# Patient Record
Sex: Female | Born: 1945
Health system: Southern US, Community
[De-identification: ages and names within clinical notes are randomized; demographics above are authoritative.]

## PROBLEM LIST (undated history)

## (undated) ENCOUNTER — Ambulatory Visit (HOSPITAL_COMMUNITY)

## (undated) DIAGNOSIS — Z90710 Acquired absence of both cervix and uterus: Secondary | ICD-10-CM

## (undated) DIAGNOSIS — I10 Essential (primary) hypertension: Secondary | ICD-10-CM

## (undated) DIAGNOSIS — E785 Hyperlipidemia, unspecified: Secondary | ICD-10-CM

## (undated) DIAGNOSIS — K219 Gastro-esophageal reflux disease without esophagitis: Secondary | ICD-10-CM

## (undated) DIAGNOSIS — E1142 Type 2 diabetes mellitus with diabetic polyneuropathy: Secondary | ICD-10-CM

## (undated) DIAGNOSIS — E1149 Type 2 diabetes mellitus with other diabetic neurological complication: Secondary | ICD-10-CM

## (undated) DIAGNOSIS — K5900 Constipation, unspecified: Secondary | ICD-10-CM

## (undated) DIAGNOSIS — Z889 Allergy status to unspecified drugs, medicaments and biological substances status: Secondary | ICD-10-CM

## (undated) DIAGNOSIS — IMO0002 Reserved for concepts with insufficient information to code with codable children: Secondary | ICD-10-CM

## (undated) DIAGNOSIS — M159 Polyosteoarthritis, unspecified: Secondary | ICD-10-CM

## (undated) DIAGNOSIS — K589 Irritable bowel syndrome without diarrhea: Secondary | ICD-10-CM

## (undated) DIAGNOSIS — M1990 Unspecified osteoarthritis, unspecified site: Secondary | ICD-10-CM

## (undated) HISTORY — DX: Unspecified osteoarthritis, unspecified site: M19.90

## (undated) HISTORY — DX: Type 2 diabetes mellitus with diabetic polyneuropathy: E11.42

## (undated) HISTORY — DX: Allergy status to unspecified drugs, medicaments and biological substances: Z88.9

## (undated) HISTORY — DX: Hyperlipidemia, unspecified: E78.5

## (undated) HISTORY — DX: Acquired absence of both cervix and uterus: Z90.710

## (undated) HISTORY — PX: PELVIC LAPAROSCOPY: SHX162

## (undated) HISTORY — DX: Essential (primary) hypertension: I10

## (undated) HISTORY — DX: Constipation, unspecified: K59.00

## (undated) HISTORY — DX: Irritable bowel syndrome, unspecified: K58.9

## (undated) HISTORY — DX: Type 2 diabetes mellitus with other diabetic neurological complication: E11.49

## (undated) HISTORY — PX: CATARACT EXTRACTION: SUR2

## (undated) HISTORY — DX: Gastro-esophageal reflux disease without esophagitis: K21.9

## (undated) HISTORY — DX: Polyosteoarthritis, unspecified: M15.9

## (undated) HISTORY — DX: Reserved for concepts with insufficient information to code with codable children: IMO0002

---

## 1972-04-16 HISTORY — PX: TONSILECTOMY, ADENOIDECTOMY, BILATERAL MYRINGOTOMY AND TUBES: SHX2538

## 1981-04-16 HISTORY — PX: VAGINAL HYSTERECTOMY: SUR661

## 1984-04-16 HISTORY — PX: OVARIAN CYST SURGERY: SHX726

## 2005-10-22 ENCOUNTER — Ambulatory Visit: Payer: Self-pay | Admitting: Family Medicine

## 2005-10-24 ENCOUNTER — Ambulatory Visit: Payer: Self-pay | Admitting: *Deleted

## 2005-11-05 ENCOUNTER — Ambulatory Visit: Payer: Self-pay | Admitting: Family Medicine

## 2005-12-10 ENCOUNTER — Ambulatory Visit: Payer: Self-pay | Admitting: Family Medicine

## 2006-05-08 ENCOUNTER — Ambulatory Visit: Payer: Self-pay | Admitting: Family Medicine

## 2006-05-09 ENCOUNTER — Ambulatory Visit (HOSPITAL_COMMUNITY): Admission: RE | Admit: 2006-05-09 | Discharge: 2006-05-09 | Payer: Self-pay | Admitting: Internal Medicine

## 2006-08-28 ENCOUNTER — Ambulatory Visit: Payer: Self-pay | Admitting: Family Medicine

## 2006-11-04 ENCOUNTER — Ambulatory Visit: Payer: Self-pay | Admitting: Internal Medicine

## 2006-12-10 ENCOUNTER — Ambulatory Visit: Payer: Self-pay | Admitting: Family Medicine

## 2007-01-01 ENCOUNTER — Encounter (INDEPENDENT_AMBULATORY_CARE_PROVIDER_SITE_OTHER): Payer: Self-pay | Admitting: *Deleted

## 2007-02-05 ENCOUNTER — Ambulatory Visit: Payer: Self-pay | Admitting: Family Medicine

## 2007-02-05 LAB — CONVERTED CEMR LAB
ALT: 19 units/L (ref 0–35)
AST: 18 units/L (ref 0–37)
Albumin: 4.4 g/dL (ref 3.5–5.2)
Alkaline Phosphatase: 63 units/L (ref 39–117)
BUN: 22 mg/dL (ref 6–23)
CO2: 24 meq/L (ref 19–32)
Calcium: 9.4 mg/dL (ref 8.4–10.5)
Chloride: 103 meq/L (ref 96–112)
Cholesterol: 259 mg/dL — ABNORMAL HIGH (ref 0–200)
Creatinine, Ser: 1.07 mg/dL (ref 0.40–1.20)
Glucose, Bld: 115 mg/dL — ABNORMAL HIGH (ref 70–99)
HDL: 58 mg/dL (ref >39)
LDL Cholesterol: 143 mg/dL — ABNORMAL HIGH (ref 0–99)
Potassium: 4.2 meq/L (ref 3.5–5.3)
Sodium: 142 meq/L (ref 135–145)
Total Bilirubin: 0.5 mg/dL (ref 0.3–1.2)
Total CHOL/HDL Ratio: 4.5
Total Protein: 7.8 g/dL (ref 6.0–8.3)
Triglycerides: 290 mg/dL — ABNORMAL HIGH (ref <150)
VLDL: 58 mg/dL — ABNORMAL HIGH (ref 0–40)

## 2007-02-20 ENCOUNTER — Ambulatory Visit: Payer: Self-pay | Admitting: Family Medicine

## 2007-02-20 LAB — CONVERTED CEMR LAB: Helicobacter Pylori Antibody-IgG: 0.9

## 2007-03-31 ENCOUNTER — Ambulatory Visit: Payer: Self-pay | Admitting: Internal Medicine

## 2007-06-03 ENCOUNTER — Encounter (INDEPENDENT_AMBULATORY_CARE_PROVIDER_SITE_OTHER): Payer: Self-pay | Admitting: Family Medicine

## 2007-06-03 ENCOUNTER — Ambulatory Visit: Payer: Self-pay | Admitting: Family Medicine

## 2007-06-03 LAB — CONVERTED CEMR LAB
Chlamydia, DNA Probe: NEGATIVE
Cholesterol: 171 mg/dL (ref 0–200)
Free T4: 0.87 ng/dL — ABNORMAL LOW (ref 0.89–1.80)
GC Probe Amp, Genital: NEGATIVE
HDL: 56 mg/dL (ref >39)
LDL Cholesterol: 66 mg/dL (ref 0–99)
TSH: 1.192 microintl units/mL (ref 0.350–5.50)
Total CHOL/HDL Ratio: 3.1
Triglycerides: 244 mg/dL — ABNORMAL HIGH (ref <150)
VLDL: 49 mg/dL — ABNORMAL HIGH (ref 0–40)

## 2007-06-05 ENCOUNTER — Ambulatory Visit (HOSPITAL_COMMUNITY): Admission: RE | Admit: 2007-06-05 | Discharge: 2007-06-05 | Payer: Self-pay | Admitting: Family Medicine

## 2007-06-06 ENCOUNTER — Ambulatory Visit (HOSPITAL_COMMUNITY): Admission: RE | Admit: 2007-06-06 | Discharge: 2007-06-06 | Payer: Self-pay | Admitting: Family Medicine

## 2007-06-20 ENCOUNTER — Ambulatory Visit: Payer: Self-pay | Admitting: Family Medicine

## 2007-08-22 ENCOUNTER — Ambulatory Visit: Payer: Self-pay | Admitting: Internal Medicine

## 2007-09-03 ENCOUNTER — Ambulatory Visit (HOSPITAL_COMMUNITY): Admission: RE | Admit: 2007-09-03 | Discharge: 2007-09-03 | Payer: Self-pay | Admitting: Family Medicine

## 2007-09-03 ENCOUNTER — Ambulatory Visit: Payer: Self-pay | Admitting: Vascular Surgery

## 2007-09-03 ENCOUNTER — Encounter (INDEPENDENT_AMBULATORY_CARE_PROVIDER_SITE_OTHER): Payer: Self-pay | Admitting: Family Medicine

## 2007-09-22 ENCOUNTER — Ambulatory Visit: Payer: Self-pay | Admitting: Internal Medicine

## 2007-09-22 ENCOUNTER — Encounter (INDEPENDENT_AMBULATORY_CARE_PROVIDER_SITE_OTHER): Payer: Self-pay | Admitting: Family Medicine

## 2007-09-22 LAB — CONVERTED CEMR LAB
ALT: 19 units/L (ref 0–35)
BUN: 20 mg/dL (ref 6–23)
CO2: 25 meq/L (ref 19–32)
Calcium: 9.1 mg/dL (ref 8.4–10.5)
Chloride: 107 meq/L (ref 96–112)
Cholesterol: 175 mg/dL (ref 0–200)
Creatinine, Ser: 0.76 mg/dL (ref 0.40–1.20)
Free T4: 0.93 ng/dL (ref 0.89–1.80)
Glucose, Bld: 145 mg/dL — ABNORMAL HIGH (ref 70–99)
HDL: 58 mg/dL (ref >39)
LDL Cholesterol: 78 mg/dL (ref 0–99)
Potassium: 4.2 meq/L (ref 3.5–5.3)
Sodium: 144 meq/L (ref 135–145)
T4, Total: 5.7 ug/dL (ref 5.0–12.5)
TSH: 2.013 microintl units/mL (ref 0.350–5.50)
Total CHOL/HDL Ratio: 3
Triglycerides: 196 mg/dL — ABNORMAL HIGH (ref <150)
VLDL: 39 mg/dL (ref 0–40)

## 2008-05-04 ENCOUNTER — Ambulatory Visit: Payer: Self-pay | Admitting: Family Medicine

## 2008-05-28 ENCOUNTER — Ambulatory Visit: Payer: Self-pay | Admitting: Family Medicine

## 2008-06-16 ENCOUNTER — Ambulatory Visit: Payer: Self-pay | Admitting: Family Medicine

## 2008-06-16 LAB — CONVERTED CEMR LAB
ALT: 18 units/L (ref 0–35)
AST: 22 units/L (ref 0–37)
Albumin: 4.2 g/dL (ref 3.5–5.2)
Alkaline Phosphatase: 51 units/L (ref 39–117)
BUN: 18 mg/dL (ref 6–23)
Band Neutrophils: 0 % (ref 0–10)
Basophils Absolute: 0 10*3/uL (ref 0.0–0.1)
Basophils Relative: 1 % (ref 0–1)
CO2: 23 meq/L (ref 19–32)
Calcium: 9.1 mg/dL (ref 8.4–10.5)
Chloride: 105 meq/L (ref 96–112)
Creatinine, Ser: 0.89 mg/dL (ref 0.40–1.20)
Eosinophils Absolute: 0.3 10*3/uL (ref 0.0–0.7)
Eosinophils Relative: 5 % (ref 0–5)
Glucose, Bld: 112 mg/dL — ABNORMAL HIGH (ref 70–99)
HCT: 43.6 % (ref 36.0–46.0)
Hemoglobin: 14.1 g/dL (ref 12.0–15.0)
Lymphocytes Relative: 52 % — ABNORMAL HIGH (ref 12–46)
Lymphs Abs: 2.4 10*3/uL (ref 0.7–4.0)
MCHC: 32.3 g/dL (ref 30.0–36.0)
MCV: 88.1 fL (ref 78.0–100.0)
Monocytes Absolute: 0.5 10*3/uL (ref 0.1–1.0)
Monocytes Relative: 11 % (ref 3–12)
Neutro Abs: 1.4 10*3/uL — ABNORMAL LOW (ref 1.7–7.7)
Neutrophils Relative %: 31 % — ABNORMAL LOW (ref 43–77)
Platelets: 186 10*3/uL (ref 150–400)
Potassium: 4.5 meq/L (ref 3.5–5.3)
RBC: 4.95 M/uL (ref 3.87–5.11)
RDW: 13.5 % (ref 11.5–15.5)
Sed Rate: 14 mm/hr (ref 0–22)
Sodium: 144 meq/L (ref 135–145)
Total Bilirubin: 0.5 mg/dL (ref 0.3–1.2)
Total Protein: 7.4 g/dL (ref 6.0–8.3)
WBC: 4.6 10*3/uL (ref 4.0–10.5)

## 2008-08-17 ENCOUNTER — Ambulatory Visit: Payer: Self-pay | Admitting: Family Medicine

## 2008-08-19 ENCOUNTER — Ambulatory Visit: Payer: Self-pay | Admitting: Family Medicine

## 2008-09-01 ENCOUNTER — Ambulatory Visit: Payer: Self-pay | Admitting: Internal Medicine

## 2008-09-09 ENCOUNTER — Ambulatory Visit (HOSPITAL_COMMUNITY): Admission: RE | Admit: 2008-09-09 | Discharge: 2008-09-09 | Payer: Self-pay | Admitting: Internal Medicine

## 2008-09-21 ENCOUNTER — Ambulatory Visit (HOSPITAL_BASED_OUTPATIENT_CLINIC_OR_DEPARTMENT_OTHER): Admission: RE | Admit: 2008-09-21 | Discharge: 2008-09-21 | Payer: Self-pay | Admitting: Family Medicine

## 2008-09-26 ENCOUNTER — Ambulatory Visit: Payer: Self-pay | Admitting: Internal Medicine

## 2008-10-20 ENCOUNTER — Ambulatory Visit: Payer: Self-pay | Admitting: Internal Medicine

## 2008-11-02 ENCOUNTER — Ambulatory Visit: Payer: Self-pay | Admitting: Family Medicine

## 2009-01-27 ENCOUNTER — Ambulatory Visit: Payer: Self-pay | Admitting: Family Medicine

## 2009-01-27 LAB — CONVERTED CEMR LAB: Microalb, Ur: 0.5 mg/dL (ref 0.00–1.89)

## 2009-02-10 ENCOUNTER — Ambulatory Visit (HOSPITAL_COMMUNITY): Admission: RE | Admit: 2009-02-10 | Discharge: 2009-02-10 | Payer: Self-pay | Admitting: Family Medicine

## 2009-04-28 ENCOUNTER — Encounter: Admission: RE | Admit: 2009-04-28 | Discharge: 2009-04-28 | Payer: Self-pay | Admitting: Nephrology

## 2009-06-06 ENCOUNTER — Ambulatory Visit (HOSPITAL_COMMUNITY): Admission: RE | Admit: 2009-06-06 | Discharge: 2009-06-06 | Payer: Self-pay | Admitting: Obstetrics

## 2009-07-14 ENCOUNTER — Ambulatory Visit: Payer: Self-pay | Admitting: Psychology

## 2009-07-22 ENCOUNTER — Encounter: Admission: RE | Admit: 2009-07-22 | Discharge: 2009-07-22 | Payer: Self-pay | Admitting: Neurology

## 2009-07-22 ENCOUNTER — Emergency Department (HOSPITAL_COMMUNITY): Admission: EM | Admit: 2009-07-22 | Discharge: 2009-07-22 | Payer: Self-pay | Admitting: Emergency Medicine

## 2009-07-26 ENCOUNTER — Ambulatory Visit: Payer: Self-pay | Admitting: Family Medicine

## 2009-09-21 ENCOUNTER — Ambulatory Visit: Payer: Self-pay | Admitting: Family Medicine

## 2009-09-30 ENCOUNTER — Ambulatory Visit (HOSPITAL_COMMUNITY): Admission: RE | Admit: 2009-09-30 | Discharge: 2009-09-30 | Payer: Self-pay | Admitting: Family Medicine

## 2009-09-30 LAB — HM DEXA SCAN

## 2009-10-12 ENCOUNTER — Ambulatory Visit: Payer: Self-pay | Admitting: Family Medicine

## 2009-10-13 ENCOUNTER — Ambulatory Visit (HOSPITAL_COMMUNITY): Admission: RE | Admit: 2009-10-13 | Discharge: 2009-10-13 | Payer: Self-pay | Admitting: Family Medicine

## 2010-01-27 ENCOUNTER — Ambulatory Visit (HOSPITAL_COMMUNITY): Admission: RE | Admit: 2010-01-27 | Discharge: 2010-01-27 | Payer: Self-pay | Admitting: Gastroenterology

## 2010-01-31 ENCOUNTER — Ambulatory Visit: Payer: Self-pay | Admitting: Psychology

## 2010-02-14 ENCOUNTER — Encounter (INDEPENDENT_AMBULATORY_CARE_PROVIDER_SITE_OTHER): Payer: Self-pay | Admitting: Family Medicine

## 2010-02-14 LAB — CONVERTED CEMR LAB
Cholesterol: 155 mg/dL (ref 0–200)
HDL: 52 mg/dL (ref >39)
Hgb A1c MFr Bld: 7 % — ABNORMAL HIGH (ref <5.7)
LDL Cholesterol: 61 mg/dL (ref 0–99)
Total CHOL/HDL Ratio: 3
Triglycerides: 212 mg/dL — ABNORMAL HIGH (ref <150)
Uric Acid, Serum: 5.6 mg/dL (ref 2.4–7.0)
VLDL: 42 mg/dL — ABNORMAL HIGH (ref 0–40)

## 2010-05-07 ENCOUNTER — Encounter: Payer: Self-pay | Admitting: Obstetrics

## 2010-05-16 NOTE — Miscellaneous (Signed)
Summary: VIP  Patient: Sierra Ray Note: All result statuses are Final unless otherwise noted.  Tests: (1) VIP (Medications)   LLIMPORTMEDS              "Result Below..."       RESULT: ZOLOFT TABS 100 MG*TAKE ONE (1) TABLET EACH DAY*12/10/2006*Last Refill: [id number]*******   LLIMPORTMEDS              "Result Below..."       RESULT: TRAZODONE HCL TABS 100 MG*TAKE ONE (1) TABLET AT BEDTIME*08/28/2006*Last Refill: Unknown*[id number]*******   LLIMPORTMEDS              "Result Below..."       RESULT: TOPROL XL TB24 25 MG*TAKE ONE-HALF TABLET BY MOUTH DAILY   GENE OVER-INCOME 01/08*12/10/2006*Last Refill: Unknown*[id number]*******   LLIMPORTMEDS              "Result Below..."       RESULT: NORVASC TABS 2.5 MG*TAKE ONE (1) TABLET BY MOUTH EVERY DAY*12/10/2006*Last Refill: Unknown*[id number]*******   LLIMPORTMEDS              "Result Below..."       RESULT: NEXIUM CPDR 40 MG*TAKE ONE CAPSULE BY MOUTH DAILY*11/04/2006*Last  Refill: 12/09/2006*[id number]*******   LLIMPORTMEDS              "Result Below..."       RESULT: NASACORT AQ AERS 55 MCG/ACT*USE TWO SPRAYS IN EACH NOSTRIL EVERY NIGHT AT BEDTIME*12/10/2006*Last Refill: [id number]*******   LLIMPORTMEDS              "Result Below..."       RESULT: METFORMIN HCL TB24 500 MG*TAKE ONE (1) TABLET TWICE DAILY*12/10/2006*Last Refill: OZHYQMV*[id number]*******   LLIMPORTMEDS              "Result Below..."       RESULT: CRESTOR TABS 10 MG*TAKE ONE-HALF TABLET DAILY   GENE OVER-INCOME 01/08*12/10/2006*Last Refill: [id number]*******   LLIMPORTMEDS              "Result Below..."       RESULT: CEFUROXIME AXETIL TABS 250 MG*TAKE ONE TABLET TWICE A DAY FOR 10 DAYS*12/10/2006*Last Refill: Unknown*[id number]*******   LLIMPORTALLS              NKDA***  Note: An exclamation mark (!) indicates a result that was not dispersed into the flowsheet. Document Creation Date: 02/13/2007 3:06 PM _______________________________________________________________________  (1) Order  result status: Final Collection or observation date-time: 01/01/2007 Requested date-time: 01/01/2007 Receipt date-time:  Reported date-time: 01/01/2007 Referring Physician:   Ordering Physician:   Specimen Source:  Source: [place] Order Number:  Lab site:

## 2010-07-05 LAB — WET PREP, GENITAL
Clue Cells Wet Prep HPF POC: NONE SEEN
Trich, Wet Prep: NONE SEEN
Yeast Wet Prep HPF POC: NONE SEEN

## 2010-07-05 LAB — CBC
HCT: 42.4 % (ref 36.0–46.0)
Hemoglobin: 14.1 g/dL (ref 12.0–15.0)
MCHC: 33.2 g/dL (ref 30.0–36.0)
MCV: 87.5 fL (ref 78.0–100.0)
Platelets: 186 10*3/uL (ref 150–400)
RBC: 4.84 MIL/uL (ref 3.87–5.11)
RDW: 13.9 % (ref 11.5–15.5)
WBC: 6.4 10*3/uL (ref 4.0–10.5)

## 2010-07-05 LAB — DIFFERENTIAL
Basophils Absolute: 0 10*3/uL (ref 0.0–0.1)
Basophils Relative: 1 % (ref 0–1)
Eosinophils Absolute: 0.2 10*3/uL (ref 0.0–0.7)
Eosinophils Relative: 3 % (ref 0–5)
Lymphocytes Relative: 50 % — ABNORMAL HIGH (ref 12–46)
Lymphs Abs: 3.2 10*3/uL (ref 0.7–4.0)
Monocytes Absolute: 0.5 10*3/uL (ref 0.1–1.0)
Monocytes Relative: 9 % (ref 3–12)
Neutro Abs: 2.4 10*3/uL (ref 1.7–7.7)
Neutrophils Relative %: 38 % — ABNORMAL LOW (ref 43–77)

## 2010-07-05 LAB — GC/CHLAMYDIA PROBE AMP, GENITAL
Chlamydia, DNA Probe: NEGATIVE
GC Probe Amp, Genital: NEGATIVE

## 2010-07-05 LAB — URINALYSIS, ROUTINE W REFLEX MICROSCOPIC
Bilirubin Urine: NEGATIVE
Glucose, UA: NEGATIVE mg/dL
Hgb urine dipstick: NEGATIVE
Ketones, ur: NEGATIVE mg/dL
Nitrite: NEGATIVE
Protein, ur: NEGATIVE mg/dL
Specific Gravity, Urine: 1.002 — ABNORMAL LOW (ref 1.005–1.030)
Urobilinogen, UA: 0.2 mg/dL (ref 0.0–1.0)
pH: 7.5 (ref 5.0–8.0)

## 2010-07-05 LAB — POCT I-STAT, CHEM 8
BUN: 15 mg/dL (ref 6–23)
Calcium, Ion: 1.18 mmol/L (ref 1.12–1.32)
Chloride: 105 mEq/L (ref 96–112)
Creatinine, Ser: 0.7 mg/dL (ref 0.4–1.2)
Glucose, Bld: 125 mg/dL — ABNORMAL HIGH (ref 70–99)
HCT: 46 % (ref 36.0–46.0)
Hemoglobin: 15.6 g/dL — ABNORMAL HIGH (ref 12.0–15.0)
Potassium: 3.6 mEq/L (ref 3.5–5.1)
Sodium: 142 mEq/L (ref 135–145)
TCO2: 29 mmol/L (ref 0–100)

## 2010-08-22 ENCOUNTER — Other Ambulatory Visit (HOSPITAL_COMMUNITY)
Admission: RE | Admit: 2010-08-22 | Discharge: 2010-08-22 | Disposition: A | Discharge status: Home / Self Care | Payer: Self-pay | Source: Ambulatory Visit | Attending: Obstetrics and Gynecology | Admitting: Obstetrics and Gynecology

## 2010-08-22 ENCOUNTER — Other Ambulatory Visit: Payer: Self-pay

## 2010-08-22 DIAGNOSIS — Z01419 Encounter for gynecological examination (general) (routine) without abnormal findings: Secondary | ICD-10-CM | POA: Insufficient documentation

## 2010-08-29 NOTE — Procedures (Signed)
Sierra Ray, BUMP         ACCOUNT NO.:  [id number]   MEDICAL RECORD NO.:  [id number]          PATIENT TYPE:  OUT   LOCATION:  SLEEP CENTER                 FACILITY:  [facility]   PHYSICIAN:  Clinton D. Maple Hudson, MD, FCCP, FACPDATE OF BIRTH:  [birth year] 04, 1946   DATE OF STUDY:  09/21/2008                            NOCTURNAL POLYSOMNOGRAM   REFERRING PHYSICIAN:   INDICATIONS FOR PROCEDURE:  Insomnia with sleep apnea and daytime  somnolence.   EPWORTH SLEEPINESS SCORE:  Epworth sleepiness score  9/24.  BMI 26.6.  Weight 170 pounds.  Height 67 inches.  Neck 13.5 inches.   HOME MEDICATIONS:  Charted and reviewed.   SLEEP ARCHITECTURE:  Total sleep time 264 minutes with sleep efficiency  70.5%.  Stage I was 6.8%, stage II was 82%, stage III absent, REM 11.2%  of total sleep time.  Sleep latency 48 minutes, REM latency 198 minutes,  awake after sleep onset 54 minutes, arousal index 10.  No bedtime  medication was taken.  Sleep onset was at about 11:20 p.m. and she was  awake for about an hour between 12:30 and 1:30 a.m.   RESPIRATORY DATA:  Apnea/hypopnea index (AHI) 0.  There were no  significant respiratory events, disturbing sleep.   OXYGEN DATA:  Mild-to-moderate snoring with oxygen desaturation to a  nadir of 93%.  Mean oxygen saturation through the study was 96.5% on  room air.   CARDIAC DATA:  Normal sinus rhythm.   MOVEMENT/PARASOMNIA:  No significant movement disturbance.  Bathroom x2.   IMPRESSION/RECOMMENDATION:  1. No significant physiologic events identified causing sleep      disturbance, AHI 0 per hour and no significant movement      disturbance.  Mild-to-moderate snoring with oxygen desaturation to      a nadir of 93%.  2. She indicated home sleep difficulty allowing only 2 hours of sleep      at night because of panic disorder, depression, and anxiety.      Clinton D. Maple Hudson, MD, [facility], FACP  Diplomate, Biomedical engineer of Sleep Medicine  Electronically  Signed     CDY/MEDQ  D:  09/26/2008 15:01:51  T:  09/27/2008 00:05:42  Job:  [id number]

## 2011-05-08 ENCOUNTER — Other Ambulatory Visit: Payer: Self-pay | Admitting: Family Medicine

## 2011-05-08 DIAGNOSIS — IMO0002 Reserved for concepts with insufficient information to code with codable children: Secondary | ICD-10-CM

## 2011-05-08 DIAGNOSIS — R87619 Unspecified abnormal cytological findings in specimens from cervix uteri: Secondary | ICD-10-CM

## 2011-05-08 HISTORY — DX: Unspecified abnormal cytological findings in specimens from cervix uteri: R87.619

## 2011-05-08 HISTORY — DX: Reserved for concepts with insufficient information to code with codable children: IMO0002

## 2011-06-21 ENCOUNTER — Ambulatory Visit (INDEPENDENT_AMBULATORY_CARE_PROVIDER_SITE_OTHER): Payer: Self-pay | Admitting: Physician Assistant

## 2011-06-21 ENCOUNTER — Encounter: Payer: Self-pay | Admitting: Physician Assistant

## 2011-06-21 ENCOUNTER — Other Ambulatory Visit (HOSPITAL_COMMUNITY)
Admission: RE | Admit: 2011-06-21 | Discharge: 2011-06-21 | Disposition: A | Discharge status: Home / Self Care | Payer: Self-pay | Source: Ambulatory Visit | Attending: Obstetrics & Gynecology | Admitting: Obstetrics & Gynecology

## 2011-06-21 VITALS — BP 130/81 | HR 82 | Temp 98.4°F | Ht 66.0 in | Wt 174.0 lb

## 2011-06-21 DIAGNOSIS — N842 Polyp of vagina: Secondary | ICD-10-CM | POA: Insufficient documentation

## 2011-06-21 DIAGNOSIS — IMO0002 Reserved for concepts with insufficient information to code with codable children: Secondary | ICD-10-CM

## 2011-06-21 DIAGNOSIS — R87612 Low grade squamous intraepithelial lesion on cytologic smear of cervix (LGSIL): Secondary | ICD-10-CM

## 2011-06-21 DIAGNOSIS — Z90710 Acquired absence of both cervix and uterus: Secondary | ICD-10-CM

## 2011-06-21 HISTORY — DX: Acquired absence of both cervix and uterus: Z90.710

## 2011-06-21 NOTE — Patient Instructions (Signed)
Colposcopy Care After Colposcopy is a procedure in which a special tool is used to magnify the surface of the cervix. A tissue sample (biopsy) may also be taken. This sample will be looked at for cervical cancer or other problems. After the test:  You may have some cramping.   Lie down for a few minutes if you feel lightheaded.    You may have some bleeding which should stop in a few days.  HOME CARE  Do not have sex or use tampons for 2 to 3 days or as told.   Only take medicine as told by your doctor.   Continue to take your birth control pills as usual.  Finding out the results of your test Ask when your test results will be ready. Make sure you get your test results. GET HELP RIGHT AWAY IF:  You are bleeding a lot or are passing blood clots.   You develop a fever of 102 F (38.9 C) or higher.   You have abnormal vaginal discharge.   You have cramps that do not go away with medicine.   You feel lightheaded, dizzy, or pass out (faint).  MAKE SURE YOU:   Understand these instructions.   Will watch your condition.   Will get help right away if you are not doing well or get worse.  Document Released: 09/19/2007 Document Revised: 03/22/2011 Document Reviewed: 09/19/2007 ExitCare Patient Information 2012 [employer]. 

## 2011-06-21 NOTE — Progress Notes (Signed)
Referred from [facility] for colpo secondary to ASCUS pap with + HR HPV. Pt reports hysterectomy 30+ years ago for fibroids and believes she still has a cervix. MD at [facility] believes she saw a cervix on exam and performed pap.  Patient given informed consent, signed copy in the chart, time out was performed. Placed in lithotomy position. Bimanual exam was performed and no cervix was palpated. Vaginal cuff was viewed with speculum and colposcope of vagina after application of acetic acid. There is an area noted on the left aspect of cuff that resembles cervix, unable to dilate area that looks like cervical os. Area of abnormal vascularization noted on cuff.  Colposcopy adequate?  Yes, of vagina Acetowhite lesions?no Punctation?no Mosaicism?  No Abnormal vasculature?  yes Biopsies?vaginal bx performed, left side of cuff ECC?No  COMMENTS: Patient was given post procedure instructions.  She will return in 2 weeks for results.    Prior to leaving clinic, pt found records from cystectomy that confirmed uterus and cervix absent at the time of surgery. If biopsy NL, patient does not need further paps.  Yaa Donnellan E. 3:02 PM

## 2011-07-12 ENCOUNTER — Ambulatory Visit (INDEPENDENT_AMBULATORY_CARE_PROVIDER_SITE_OTHER): Payer: Self-pay | Admitting: Family Medicine

## 2011-07-12 VITALS — BP 124/76 | HR 82 | Temp 97.5°F | Ht 65.75 in | Wt 173.0 lb

## 2011-07-12 DIAGNOSIS — R87610 Atypical squamous cells of undetermined significance on cytologic smear of cervix (ASC-US): Secondary | ICD-10-CM

## 2011-07-12 DIAGNOSIS — IMO0001 Reserved for inherently not codable concepts without codable children: Secondary | ICD-10-CM

## 2011-07-12 NOTE — Progress Notes (Signed)
  Subjective:    Patient ID: Sierra Ray, female    DOB: [birth year], 66 y.o.   MRN: [id number]  HPI Patient seen for results of vaginal cuff biopsy.  Patient not having difficulties, bleeding, cramping etc.     Review of Systems     Objective:   Physical Exam  Constitutional: She is oriented to person, place, and time. She appears well-developed and well-nourished.  Neurological: She is alert and oriented to person, place, and time.  Psychiatric: She has a normal mood and affect. Her behavior is normal. Judgment and thought content normal.      Assessment & Plan:  1.  ASCUS Benign biopsy results.  Discussed no need for further follow up.

## 2011-08-13 ENCOUNTER — Telehealth: Payer: Self-pay

## 2011-08-13 NOTE — Telephone Encounter (Signed)
Pt called and wanted to know lab results from 08/21/11 cause she got a bill indicating that labs were done.

## 2011-08-13 NOTE — Telephone Encounter (Signed)
Spoke with patient she has already spoken to someone from the clinic.

## 2012-01-21 ENCOUNTER — Emergency Department (HOSPITAL_COMMUNITY): Payer: Self-pay

## 2012-01-21 ENCOUNTER — Emergency Department (HOSPITAL_COMMUNITY)
Admission: EM | Admit: 2012-01-21 | Discharge: 2012-01-21 | Disposition: A | Discharge status: Home / Self Care | Payer: Self-pay | Attending: Emergency Medicine | Admitting: Emergency Medicine

## 2012-01-21 ENCOUNTER — Encounter (HOSPITAL_COMMUNITY): Payer: Self-pay

## 2012-01-21 DIAGNOSIS — J45909 Unspecified asthma, uncomplicated: Secondary | ICD-10-CM | POA: Insufficient documentation

## 2012-01-21 DIAGNOSIS — I10 Essential (primary) hypertension: Secondary | ICD-10-CM | POA: Insufficient documentation

## 2012-01-21 DIAGNOSIS — R2689 Other abnormalities of gait and mobility: Secondary | ICD-10-CM

## 2012-01-21 DIAGNOSIS — J310 Chronic rhinitis: Secondary | ICD-10-CM | POA: Insufficient documentation

## 2012-01-21 DIAGNOSIS — Z79899 Other long term (current) drug therapy: Secondary | ICD-10-CM | POA: Insufficient documentation

## 2012-01-21 DIAGNOSIS — R29818 Other symptoms and signs involving the nervous system: Secondary | ICD-10-CM | POA: Insufficient documentation

## 2012-01-21 DIAGNOSIS — E119 Type 2 diabetes mellitus without complications: Secondary | ICD-10-CM | POA: Insufficient documentation

## 2012-01-21 DIAGNOSIS — E785 Hyperlipidemia, unspecified: Secondary | ICD-10-CM | POA: Insufficient documentation

## 2012-01-21 LAB — RAPID STREP SCREEN (MED CTR MEBANE ONLY): Streptococcus, Group A Screen (Direct): NEGATIVE

## 2012-01-21 LAB — GLUCOSE, CAPILLARY: Glucose-Capillary: 108 mg/dL — ABNORMAL HIGH (ref 70–99)

## 2012-01-21 MED ORDER — METFORMIN HCL ER 500 MG PO TB24: 500.0000 mg | ORAL_TABLET | Freq: Two times a day (BID) | ORAL | Status: DC

## 2012-01-21 MED ORDER — AMOXICILLIN 500 MG PO CAPS: 500.0000 mg | ORAL_CAPSULE | Freq: Three times a day (TID) | ORAL | Status: DC

## 2012-01-21 MED ORDER — FLUTICASONE PROPIONATE 50 MCG/ACT NA SUSP: 2.0000 | Freq: Every day | NASAL | Status: DC

## 2012-01-21 MED ORDER — PRAVASTATIN SODIUM 20 MG PO TABS: 20.0000 mg | ORAL_TABLET | Freq: Every day | ORAL | Status: DC

## 2012-01-21 MED ORDER — AMLODIPINE BESYLATE 5 MG PO TABS: 5.0000 mg | ORAL_TABLET | Freq: Every day | ORAL | Status: DC

## 2012-01-21 MED ORDER — MECLIZINE HCL 25 MG PO TABS: ORAL_TABLET | ORAL | Status: DC

## 2012-01-21 NOTE — ED Provider Notes (Cosign Needed Addendum)
History     CSN: [id number]  Arrival date & time 01/21/12  0718   First MD Initiated Contact with Patient 01/21/12 [phone]      Chief Complaint  Patient presents with  . Sore Throat    (Consider location/radiation/quality/duration/timing/severity/associated sxs/prior treatment) HPI  Patient here for 2 problems. The first is she states she's had a mild sore throat for the past week that improving. She states however she has a "terrible smell" from her throat and her nose for the past 3 days. She denies fever, rhinorrhea, shortness of breath, facial pain. She states she has a mild dry cough. She does report being around small children recently.  Patient also states for several years she has had the feeling that she is off balance. She states for the past 6 months or more she actually feels like she is falling and has had some falling. She reports is getting worse over the past month. She denies headache, nausea or vomiting. She states she had her eyesight checked in June and had to get new prescriptions for change in her vision. She also describes numbness and tingling bilaterally in her arms and legs for several years. Patient denies a feeling of spinning but does report a feeling of movement.  [facility]  Past Medical History  Diagnosis Date  . Diabetes mellitus   . Hypertension   . Hyperlipidemia   . Asthma   . Hx of seasonal allergies   . Arthritis   . GERD (gastroesophageal reflux disease)   . Abnormal Pap smear 05/08/2011    ASC-US +HPV  . S/P total hysterectomy 06/21/2011    Past Surgical History  Procedure Date  . Hysterectomy 1983    vaginal  . Ovarian cyst surgery 1986  . Tonsilectomy, adenoidectomy, bilateral myringotomy and tubes 1974    Family History  Problem Relation Age of Onset  . Diabetes Father   . Heart disease Father   . Diabetes Mother   . Cancer Brother     pancreas  . Cancer Sister     pancreas    History  Substance Use Topics  .  Smoking status: Never Smoker   . Smokeless tobacco: Never Used  . Alcohol Use: No   retired  OB History    Grav Para Term Preterm Abortions TAB SAB Ect Mult Living   5 4 4  1  1   4       Review of Systems  All other systems reviewed and are negative.    Allergies  Aspirin  Home Medications   Current Outpatient Rx  Name Route Sig Dispense Refill  . AMLODIPINE BESYLATE 5 MG PO TABS Oral Take 5 mg by mouth daily.    Marland Kitchen CALCIUM CARBONATE-VITAMIN D 250-125 MG-UNIT PO TABS Oral Take 1 tablet by mouth daily. Every other day    . VITAMIN D 1000 UNITS PO TABS Oral Take 1,000 Units by mouth daily. Vit D with magnesium. Every other day alternate wit ca+D    . FLUTICASONE PROPIONATE 50 MCG/ACT NA SUSP Nasal Place 2 sprays into the nose daily.    Marland Kitchen LORATADINE 10 MG PO TABS Oral Take 10 mg by mouth daily.    Marland Kitchen METFORMIN HCL ER 500 MG PO TB24 Oral Take 500 mg by mouth 2 (two) times daily.    . MULTI-VITAMIN/MINERALS PO TABS Oral Take 1 tablet by mouth daily.    Marland Kitchen OVER THE COUNTER MEDICATION  1 drop as needed. For dry eyes. Equate  Brand Restore Tears Lubricant Eye Drops    . PRAVASTATIN SODIUM 20 MG PO TABS Oral Take 20 mg by mouth daily.    Marland Kitchen VITAMIN E 400 UNITS PO CAPS Oral Take 400 Units by mouth daily.    Marland Kitchen POLYETHYLENE GLYCOL 3350 PO PACK Oral Take 17 g by mouth daily as needed. For constipation      BP 147/64  Pulse 84  Temp 98.6 F (37 C) (Oral)  Resp 20  SpO2 100%  Vital signs normal    Physical Exam  Nursing note and vitals reviewed. Constitutional: She is oriented to person, place, and time. She appears well-developed and well-nourished.  Non-toxic appearance. She does not appear ill. No distress.  HENT:  Head: Normocephalic and atraumatic.  Right Ear: External ear normal.  Left Ear: External ear normal.  Nose: Nose normal. No mucosal edema or rhinorrhea.  Mouth/Throat: Oropharynx is clear and moist and mucous membranes are normal. No dental abscesses or uvula swelling.        Patient has moderate swelling of her turbinates on the right and she also has some decreased air movement when she closes her left nostril. Oropharynx appears benign, there is no soft palate redness or swelling, there is no cervical lymphadenopathy.  Eyes: Conjunctivae normal and EOM are normal. Pupils are equal, round, and reactive to light.  Neck: Normal range of motion and full passive range of motion without pain. Neck supple.  Cardiovascular: Normal rate, regular rhythm and normal heart sounds.  Exam reveals no gallop and no friction rub.   No murmur heard. Pulmonary/Chest: Effort normal and breath sounds normal. No respiratory distress. She has no wheezes. She has no rhonchi. She has no rales. She exhibits no tenderness and no crepitus.  Abdominal: Soft. Normal appearance and bowel sounds are normal. She exhibits no distension. There is no tenderness. There is no rebound and no guarding.  Musculoskeletal: Normal range of motion. She exhibits no edema and no tenderness.       Moves all extremities well.   Neurological: She is alert and oriented to person, place, and time. She has normal strength. No cranial nerve deficit.       No nystatin is noted, grip strengths are equal bilaterally, motor strength is equal bilaterally, finger to nose is coordinated bilaterally, there is no visual field deficit noted, patient watched ambulating  And seems to have mild difficulty turning. Of note sometimes her right eye has a lateral disconjugate gaze when I was testing her visual field, but she does not display it when I check her extraocular muscles  Skin: Skin is warm, dry and intact. No rash noted. No erythema. No pallor.  Psychiatric: She has a normal mood and affect. Her speech is normal and behavior is normal. Her mood appears not anxious.    ED Course  Procedures (including critical care time)  At discharge, despite telling me [facility] didn't do anything about her feeling off-balance, she  states she has seen Dr Terrace Arabia for this same thing about 3 years ago,  Results for orders placed during the hospital encounter of 01/21/12  RAPID STREP SCREEN      Component Value Range   Streptococcus, Group A Screen (Direct) NEGATIVE  NEGATIVE  GLUCOSE, CAPILLARY      Component Value Range   Glucose-Capillary 108 (*) 70 - 99 mg/dL   Comment 1 Notify RN     No results found.   Ct Head Wo Contrast  01/21/2012  *RADIOLOGY REPORT*  Clinical Data: Unsteady gait, headache  CT HEAD WITHOUT CONTRAST  Technique:  Contiguous axial images were obtained from the base of the skull through the vertex without contrast.  Comparison: MRI brain dated 07/22/2009  Findings: No evidence of parenchymal hemorrhage or extra-axial fluid collection. No mass lesion, mass effect, or midline shift.  No CT evidence of acute infarction.  Cerebral volume is age appropriate.  No ventriculomegaly.  The visualized paranasal sinuses are essentially clear. The mastoid air cells are unopacified.  No evidence of calvarial fracture.  IMPRESSION: No evidence of acute intracranial abnormality.   Original Report Authenticated By: Charline Bills, M.D.      1. Balance problem   2. Rhinitis     New Prescriptions   AMOXICILLIN (AMOXIL) 500 MG CAPSULE    Take 1 capsule (500 mg total) by mouth 3 (three) times daily.   MECLIZINE (ANTIVERT) 25 MG TABLET    Take 1 po QID for dizziness   Refills given for metformin, pravachol, Flonase, Norvasc   Plan discharge  Devoria Albe, MD, FACEP   MDM          Ward Givens, MD 01/21/12 1200  Ward Givens, MD 01/21/12 1220

## 2012-01-21 NOTE — ED Notes (Signed)
Patient reports that she has a foul odor in her mouth, sore throat, and unsteady gait x 3 days

## 2012-01-21 NOTE — ED Notes (Signed)
Assumed care of pt, she sts for about a month she feels off balance and she "walks like if drunk". Pt is sitting in chair at the moment in no obvious distress will continue to monitor.

## 2012-07-23 ENCOUNTER — Ambulatory Visit: Payer: Self-pay | Admitting: Gynecology

## 2012-08-19 ENCOUNTER — Other Ambulatory Visit: Payer: Self-pay | Admitting: Family Medicine

## 2012-08-19 DIAGNOSIS — R1030 Lower abdominal pain, unspecified: Secondary | ICD-10-CM

## 2012-08-19 DIAGNOSIS — R109 Unspecified abdominal pain: Secondary | ICD-10-CM

## 2012-08-22 ENCOUNTER — Ambulatory Visit
Admission: RE | Admit: 2012-08-22 | Discharge: 2012-08-22 | Disposition: A | Discharge status: Home / Self Care | Payer: Medicare Other | Source: Ambulatory Visit | Attending: Family Medicine | Admitting: Family Medicine

## 2012-08-22 DIAGNOSIS — R1030 Lower abdominal pain, unspecified: Secondary | ICD-10-CM

## 2012-08-22 DIAGNOSIS — R109 Unspecified abdominal pain: Secondary | ICD-10-CM

## 2012-11-19 ENCOUNTER — Other Ambulatory Visit: Payer: Self-pay

## 2013-01-29 ENCOUNTER — Other Ambulatory Visit: Payer: Self-pay | Admitting: Family Medicine

## 2013-01-29 DIAGNOSIS — R9389 Abnormal findings on diagnostic imaging of other specified body structures: Secondary | ICD-10-CM

## 2013-02-03 ENCOUNTER — Other Ambulatory Visit: Payer: Medicare Other

## 2013-02-19 ENCOUNTER — Other Ambulatory Visit: Payer: Self-pay

## 2013-03-02 ENCOUNTER — Inpatient Hospital Stay: Admission: RE | Admit: 2013-03-02 | Payer: Medicare Other | Source: Ambulatory Visit

## 2013-03-10 ENCOUNTER — Other Ambulatory Visit: Payer: Self-pay | Admitting: Obstetrics and Gynecology

## 2013-03-10 ENCOUNTER — Other Ambulatory Visit (HOSPITAL_COMMUNITY)
Admission: RE | Admit: 2013-03-10 | Discharge: 2013-03-10 | Disposition: A | Discharge status: Home / Self Care | Payer: Medicare Other | Source: Ambulatory Visit | Attending: Obstetrics and Gynecology | Admitting: Obstetrics and Gynecology

## 2013-03-10 DIAGNOSIS — Z01419 Encounter for gynecological examination (general) (routine) without abnormal findings: Secondary | ICD-10-CM | POA: Insufficient documentation

## 2013-03-10 DIAGNOSIS — Z1151 Encounter for screening for human papillomavirus (HPV): Secondary | ICD-10-CM | POA: Insufficient documentation

## 2013-03-10 DIAGNOSIS — R109 Unspecified abdominal pain: Secondary | ICD-10-CM

## 2013-03-17 ENCOUNTER — Ambulatory Visit
Admission: RE | Admit: 2013-03-17 | Discharge: 2013-03-17 | Disposition: A | Discharge status: Home / Self Care | Payer: Medicare Other | Source: Ambulatory Visit | Attending: Obstetrics and Gynecology | Admitting: Obstetrics and Gynecology

## 2013-03-17 ENCOUNTER — Other Ambulatory Visit: Payer: Self-pay | Admitting: Obstetrics and Gynecology

## 2013-03-17 DIAGNOSIS — Z1231 Encounter for screening mammogram for malignant neoplasm of breast: Secondary | ICD-10-CM

## 2013-03-17 DIAGNOSIS — R109 Unspecified abdominal pain: Secondary | ICD-10-CM

## 2013-03-17 MED ORDER — IOHEXOL 350 MG/ML SOLN
100.0000 mL | Freq: Once | INTRAVENOUS | Status: AC | PRN
  Administered 2013-03-17: 100 mL via INTRAVENOUS

## 2013-03-23 ENCOUNTER — Ambulatory Visit (HOSPITAL_COMMUNITY)
Admission: RE | Admit: 2013-03-23 | Discharge: 2013-03-23 | Disposition: A | Discharge status: Home / Self Care | Payer: Medicare Other | Source: Ambulatory Visit | Attending: Obstetrics and Gynecology | Admitting: Obstetrics and Gynecology

## 2013-03-23 DIAGNOSIS — Z1231 Encounter for screening mammogram for malignant neoplasm of breast: Secondary | ICD-10-CM | POA: Insufficient documentation

## 2013-06-29 ENCOUNTER — Ambulatory Visit: Payer: Self-pay | Admitting: Podiatry

## 2013-08-13 ENCOUNTER — Ambulatory Visit (INDEPENDENT_AMBULATORY_CARE_PROVIDER_SITE_OTHER): Payer: Commercial Managed Care - HMO | Admitting: Podiatry

## 2013-08-13 ENCOUNTER — Encounter: Payer: Self-pay | Admitting: Podiatry

## 2013-08-13 DIAGNOSIS — M7750 Other enthesopathy of unspecified foot: Secondary | ICD-10-CM

## 2013-08-13 DIAGNOSIS — B351 Tinea unguium: Secondary | ICD-10-CM

## 2013-08-13 DIAGNOSIS — M948X9 Other specified disorders of cartilage, unspecified sites: Secondary | ICD-10-CM

## 2013-08-13 MED ORDER — TRIAMCINOLONE ACETONIDE 10 MG/ML IJ SUSP
10.0000 mg | Freq: Once | INTRAMUSCULAR | Status: AC
  Administered 2013-08-13: 10 mg

## 2013-08-13 NOTE — Patient Instructions (Signed)
Diabetes and Foot Care Diabetes may cause you to have problems because of poor blood supply (circulation) to your feet and legs. This may cause the skin on your feet to become thinner, break easier, and heal more slowly. Your skin may become dry, and the skin may peel and crack. You may also have nerve damage in your legs and feet causing decreased feeling in them. You may not notice minor injuries to your feet that could lead to infections or more serious problems. Taking care of your feet is one of the most important things you can do for yourself.  HOME CARE INSTRUCTIONS  Wear shoes at all times, even in the house. Do not go barefoot. Bare feet are easily injured.  Check your feet daily for blisters, cuts, and redness. If you cannot see the bottom of your feet, use a mirror or ask someone for help.  Wash your feet with warm water (do not use hot water) and mild soap. Then pat your feet and the areas between your toes until they are completely dry. Do not soak your feet as this can dry your skin.  Apply a moisturizing lotion or petroleum jelly (that does not contain alcohol and is unscented) to the skin on your feet and to dry, brittle toenails. Do not apply lotion between your toes.  Trim your toenails straight across. Do not dig under them or around the cuticle. File the edges of your nails with an emery board or nail file.  Do not cut corns or calluses or try to remove them with medicine.  Wear clean socks or stockings every day. Make sure they are not too tight. Do not wear knee-high stockings since they may decrease blood flow to your legs.  Wear shoes that fit properly and have enough cushioning. To break in new shoes, wear them for just a few hours a day. This prevents you from injuring your feet. Always look in your shoes before you put them on to be sure there are no objects inside.  Do not cross your legs. This may decrease the blood flow to your feet.  If you find a minor scrape,  cut, or break in the skin on your feet, keep it and the skin around it clean and dry. These areas may be cleansed with mild soap and water. Do not cleanse the area with peroxide, alcohol, or iodine.  When you remove an adhesive bandage, be sure not to damage the skin around it.  If you have a wound, look at it several times a day to make sure it is healing.  Do not use heating pads or hot water bottles. They may burn your skin. If you have lost feeling in your feet or legs, you may not know it is happening until it is too late.  Make sure your health care provider performs a complete foot exam at least annually or more often if you have foot problems. Report any cuts, sores, or bruises to your health care provider immediately. SEEK MEDICAL CARE IF:   You have an injury that is not healing.  You have cuts or breaks in the skin.  You have an ingrown nail.  You notice redness on your legs or feet.  You feel burning or tingling in your legs or feet.  You have pain or cramps in your legs and feet.  Your legs or feet are numb.  Your feet always feel cold. SEEK IMMEDIATE MEDICAL CARE IF:   There is increasing redness,   swelling, or pain in or around a wound.  There is a red line that goes up your leg.  Pus is coming from a wound.  You develop a fever or as directed by your health care provider.  You notice a bad smell coming from an ulcer or wound. Document Released: 03/30/2000 Document Revised: 12/03/2012 Document Reviewed: 09/09/2012 [employer] Patient Information 2014 [employer].  

## 2013-08-14 NOTE — Progress Notes (Signed)
Subjective:     Patient ID: Sierra Ray, female   DOB: 11-Jan-1946, 68 y.o.   MRN: [id number]  HPI patient presents stating she has a lot of pain in her left ankle and also has pain underneath the sesamoidal complex of both feet that makes walking difficult and has been more tender recently   Review of Systems     Objective:   Physical Exam Neurovascular status intact with discomfort in the plantar distal arch of both feet that also goes into the sesamoidal complex also noted to have thick nailbeds 1-5 bilateral feet    Assessment:     Probable distal fasciitis sesamoiditis of both feet and nailbed disease 1-5 both feet nonpainful    Plan:     Careful distal injection administered 3 mg Kenalog 5 mg Xylocaine Marcaine mixture and debrided nailbeds 1-5 both feet with no iatrogenic bleeding noted

## 2013-11-12 ENCOUNTER — Ambulatory Visit: Payer: Commercial Managed Care - HMO | Admitting: Podiatry

## 2013-12-30 ENCOUNTER — Other Ambulatory Visit: Payer: Self-pay | Admitting: Family Medicine

## 2013-12-30 DIAGNOSIS — I739 Peripheral vascular disease, unspecified: Secondary | ICD-10-CM

## 2013-12-31 ENCOUNTER — Ambulatory Visit (INDEPENDENT_AMBULATORY_CARE_PROVIDER_SITE_OTHER): Payer: Commercial Managed Care - HMO | Admitting: Podiatry

## 2013-12-31 DIAGNOSIS — M7750 Other enthesopathy of unspecified foot: Secondary | ICD-10-CM

## 2013-12-31 DIAGNOSIS — M79609 Pain in unspecified limb: Secondary | ICD-10-CM

## 2013-12-31 DIAGNOSIS — M79673 Pain in unspecified foot: Secondary | ICD-10-CM

## 2013-12-31 DIAGNOSIS — B351 Tinea unguium: Secondary | ICD-10-CM

## 2013-12-31 NOTE — Progress Notes (Signed)
   Subjective:    Patient ID: Sierra Ray, female    DOB: 03-07-1946, 68 y.o.   MRN: [id number]  HPI Pt presents for nail debridement   Review of Systems     Objective:   Physical Exam        Assessment & Plan:

## 2014-01-04 ENCOUNTER — Ambulatory Visit
Admission: RE | Admit: 2014-01-04 | Discharge: 2014-01-04 | Disposition: A | Discharge status: Home / Self Care | Payer: Commercial Managed Care - HMO | Source: Ambulatory Visit | Attending: Family Medicine | Admitting: Family Medicine

## 2014-01-04 DIAGNOSIS — I739 Peripheral vascular disease, unspecified: Secondary | ICD-10-CM

## 2014-01-04 NOTE — Progress Notes (Signed)
Subjective:     Patient ID: Sierra Ray, female   DOB: 1945/09/16, 67 y.o.   MRN: [id number]  HPI patient presents with painful nailbeds 1-5 both feet that are thick and she cannot cut herself   Review of Systems     Objective:   Physical Exam Neurovascular status intact with thick yellow brittle nailbeds 1-5 both feet    Assessment:     Mycotic nail infection is with pain 1-5 both feet    Plan:     Debride painful nailbeds 1-5 both feet with no iatrogenic bleeding noted

## 2014-01-14 ENCOUNTER — Encounter: Payer: Self-pay | Admitting: *Deleted

## 2014-01-15 ENCOUNTER — Encounter: Payer: Self-pay | Admitting: *Deleted

## 2014-01-15 ENCOUNTER — Encounter: Payer: Self-pay | Admitting: Cardiology

## 2014-01-15 ENCOUNTER — Ambulatory Visit (INDEPENDENT_AMBULATORY_CARE_PROVIDER_SITE_OTHER): Payer: Commercial Managed Care - HMO | Admitting: Cardiology

## 2014-01-15 VITALS — BP 124/76 | HR 73 | Ht 65.75 in | Wt 175.0 lb

## 2014-01-15 DIAGNOSIS — I491 Atrial premature depolarization: Secondary | ICD-10-CM | POA: Insufficient documentation

## 2014-01-15 DIAGNOSIS — I10 Essential (primary) hypertension: Secondary | ICD-10-CM | POA: Insufficient documentation

## 2014-01-15 DIAGNOSIS — R0600 Dyspnea, unspecified: Secondary | ICD-10-CM | POA: Insufficient documentation

## 2014-01-15 DIAGNOSIS — I82403 Acute embolism and thrombosis of unspecified deep veins of lower extremity, bilateral: Secondary | ICD-10-CM

## 2014-01-15 DIAGNOSIS — R0609 Other forms of dyspnea: Secondary | ICD-10-CM | POA: Insufficient documentation

## 2014-01-15 DIAGNOSIS — E785 Hyperlipidemia, unspecified: Secondary | ICD-10-CM

## 2014-01-15 DIAGNOSIS — R002 Palpitations: Secondary | ICD-10-CM

## 2014-01-15 DIAGNOSIS — I872 Venous insufficiency (chronic) (peripheral): Secondary | ICD-10-CM

## 2014-01-15 NOTE — Progress Notes (Signed)
Patient ID: Sierra Ray, female   DOB: 1946/04/11, 68 y.o.   MRN: [id number]    Patient Name: Sierra Ray Date of Encounter: 01/15/2014  Primary Care Provider:  Donnie Coffin, MD Primary Cardiologist:  Dorothy Spark  Problem List   Past Medical History  Diagnosis Date  . Diabetes mellitus type 2 with neurological manifestations   . Hypertension   . Hyperlipidemia   . Asthma   . Hx of seasonal allergies   . Arthritis   . GERD (gastroesophageal reflux disease)   . Abnormal Pap smear 05/08/2011    ASC-US +HPV  . S/P total hysterectomy 06/21/2011  . IBS (irritable bowel syndrome)   . Diabetic peripheral neuropathy    Past Surgical History  Procedure Laterality Date  . Vaginal hysterectomy  1983  . Ovarian cyst surgery  1986  . Tonsilectomy, adenoidectomy, bilateral myringotomy and tubes  1974    Allergies  Allergies  Allergen Reactions  . Aspirin     HPI  A very pleasant 68 year old female who is coming with concern of palpitations that started 4 days ago. They are on and off occuring throughout the day and are associated with dizziness and SOB. No syncope. She also feels chest heaviness, no pain. She has noticed fatigue and mildly worsening DOE over the last few months. She has been treated for HTN, HLP and DM2 for the last 20 years. HbA1c 7.7 %. Her father had CABG in his early 38'. She has never smoked. She also complains of significant B/L calf pain especially on exertion with mild B/L edema, significant varicose veins. Arterial Duplex was negative.   Home Medications  Prior to Admission medications   Medication Sig Start Date End Date Taking? Authorizing Provider  ACCU-CHEK AVIVA PLUS test strip As directed for Diabetes 11/26/13  Yes Historical Provider, MD  ACCU-CHEK SOFTCLIX LANCETS lancets As directed for Diabetes 11/26/13  Yes Historical Provider, MD  Alcohol Swabs (B-D SINGLE USE SWABS REGULAR) PADS As directed for diabetes 11/26/13  Yes Historical  Provider, MD  amLODipine (NORVASC) 5 MG tablet Take 1 tablet (5 mg total) by mouth daily. 01/21/12  Yes Janice Norrie, MD  cholecalciferol (VITAMIN D) 1000 UNITS tablet Take 1,000 Units by mouth daily.    Yes Historical Provider, MD  diclofenac (VOLTAREN) 75 MG EC tablet Take 75 mg by mouth daily as needed.    Yes Historical Provider, MD  fluticasone (FLONASE) 50 MCG/ACT nasal spray Place 2 sprays into the nose daily as needed. 01/21/12  Yes Janice Norrie, MD  gabapentin (NEURONTIN) 400 MG capsule Take 400 mg by mouth 2 (two) times daily.   Yes Historical Provider, MD  loratadine (CLARITIN) 10 MG tablet Take 10 mg by mouth daily.   Yes Historical Provider, MD  metFORMIN (GLUCOPHAGE-XR) 500 MG 24 hr tablet Take 1 tablet (500 mg total) by mouth 2 (two) times daily. 01/21/12  Yes Janice Norrie, MD  Multiple Vitamins-Minerals (ICAPS AREDS FORMULA PO) Take by mouth daily.   Yes Historical Provider, MD  Multiple Vitamins-Minerals (MULTIVITAMIN WITH MINERALS) tablet Take 1 tablet by mouth daily.   Yes Historical Provider, MD  Omega-3 Fatty Acids (FISH OIL) 1000 MG CAPS Take 2 capsules by mouth daily.   Yes Historical Provider, MD  OVER THE COUNTER MEDICATION 1 drop as needed. For dry eyes. Equate Brand Restore Tears Lubricant Eye Drops   Yes Historical Provider, MD  polyethylene glycol (MIRALAX / GLYCOLAX) packet Take 17 g by mouth daily as needed. For  constipation   Yes Historical Provider, MD  pravastatin (PRAVACHOL) 40 MG tablet Take 40 mg by mouth daily.   Yes Historical Provider, MD  Probiotic Product (PROBIOTIC DAILY PO) Take by mouth.   Yes Historical Provider, MD    Family History  Family History  Problem Relation Age of Onset  . Diabetes Father   . Heart disease Father   . Diabetes Mother   . Pancreatic cancer Brother   . Pancreatic cancer Sister     Social History  History   Social History  . Marital Status: Divorced    Spouse Name: N/A    Number of Children: 67  . Years of Education:  N/A   Occupational History  .     Social History Main Topics  . Smoking status: Never Smoker   . Smokeless tobacco: Never Used  . Alcohol Use: No  . Drug Use: No  . Sexual Activity: Not Currently    Birth Control/ Protection: Surgical   Other Topics Concern  . Not on file   Social History Narrative  . No narrative on file     Review of Systems, as per HPI, otherwise negative General:  No chills, fever, night sweats or weight changes.  Cardiovascular:  No chest pain, dyspnea on exertion, edema, orthopnea, palpitations, paroxysmal nocturnal dyspnea. Dermatological: No rash, lesions/masses Respiratory: No cough, dyspnea Urologic: No hematuria, dysuria Abdominal:   No nausea, vomiting, diarrhea, bright red blood per rectum, melena, or hematemesis Neurologic:  No visual changes, wkns, changes in mental status. All other systems reviewed and are otherwise negative except as noted above.  Physical Exam  Blood pressure 124/76, pulse 73, height 5' 5.75" (1.67 m), weight 175 lb (79.379 kg), SpO2 98.00%.  General: Pleasant, NAD Psych: Normal affect. Neuro: Alert and oriented X 3. Moves all extremities spontaneously. HEENT: Normal  Neck: Supple without bruits or JVD. Lungs:  Resp regular and unlabored, CTA. Heart: RRR no s3, s4, or murmurs. Abdomen: Soft, non-tender, non-distended, BS + x 4.  Extremities: No clubbing, cyanosis, +1 edema B/L, varicose veins. DP/PT/Radials 2+ and equal bilaterally.  Labs:  No results found for this basename: CKTOTAL, CKMB, TROPONINI,  in the last 72 hours Lab Results  Component Value Date   WBC 6.4 07/22/2009   HGB 15.6* 07/22/2009   HCT 46.0 07/22/2009   MCV 87.5 07/22/2009   PLT 186 07/22/2009       Component Value Date/Time   NA 142 07/22/2009 1143   K 3.6 07/22/2009 1143   CL 105 07/22/2009 1143   CO2 23 06/16/2008 2214   GLUCOSE 125* 07/22/2009 1143   BUN 15 07/22/2009 1143   CREATININE 0.7 07/22/2009 1143   CALCIUM 9.1 06/16/2008 2214   PROT 7.4  06/16/2008 2214   ALBUMIN 4.2 06/16/2008 2214   AST 22 06/16/2008 2214   ALT 18 06/16/2008 2214   ALKPHOS 51 06/16/2008 2214   BILITOT 0.5 06/16/2008 2214   TSH 1 HbA1c 7.4 % TAG 347 HDL 48 LDL 123  Accessory Clinical Findings  Echocardiogram - none  ECG - SR, PACs, nonspecific T wave abnormalities  01/04/14 FINDINGS:  Right ABI: 1.1  Left ABI: 1.1  Right Lower Extremity: Normal triphasic waveforms throughout the  right lower extremity. Segmental pressures demonstrate no focal  stenosis. Normal PVRs.  Left Lower Extremity: Normal triphasic waveforms throughout the left  lower extremity. Segmental pressures demonstrate no focal stenosis.  Normal PVRs.  IMPRESSION:  Normal multi level noninvasive bilateral lower extremity vascular  study.  No evidence for peripheral arterial disease.  Signed,  Criselda Peaches, MD  [facility]  [facility]   Assessment & Plan  68 year old female  52. Palpitations - ECG yesterday showed frequent PACs, on auscultation she has irregularly irregular rhythm, we will order 24 hour Holter monitor to evaluate for possible a-fib, labs including TSH normal  2. DOE - multiple risk factors including FH of CAD, DM, HTN, HLP, we wil order a Lexiscan nuclear stress test  3. LE swelling and pain - we will order venous Duplex to evaluate for DVT and venous insufficiency.   4. HTN - controlled  5. HLP - followed by PCP.  6. DM - HbA1c 7.7 %  Follow up in 1 month.    Dorothy Spark, MD, [facility] 01/15/2014, 2:57 PM

## 2014-01-15 NOTE — Patient Instructions (Signed)
Your physician recommends that you continue on your current medications as directed. Please refer to the Current Medication list given to you today.   Your physician has recommended that you wear a 24 HOUR holter monitor. Holter monitors are medical devices that record the heart's electrical activity. Doctors most often use these monitors to diagnose arrhythmias. Arrhythmias are problems with the speed or rhythm of the heartbeat. The monitor is a small, portable device. You can wear one while you do your normal daily activities. This is usually used to diagnose what is causing palpitations/syncope (passing out).  Your physician has requested that you have a lexiscan myoview. For further information please visit [email or web address]. Please follow instruction sheet, as given.  Your physician has requested that you have a lower extremity venous duplex. This test is an ultrasound of the veins in the legs or arms. It looks at venous blood flow that carries blood from the heart to the legs or arms. Allow one hour for a Lower Venous exam. Allow thirty minutes for an Upper Venous exam. There are no restrictions or special instructions.  Your physician recommends that you schedule a follow-up appointment in: SCHEDULE A FOLLOW-UP APPOINTMENT AFTER YOUR TEST ARE COMPLETE

## 2014-01-18 ENCOUNTER — Encounter (HOSPITAL_COMMUNITY): Payer: Commercial Managed Care - HMO

## 2014-01-20 ENCOUNTER — Telehealth: Payer: Self-pay | Admitting: *Deleted

## 2014-01-20 ENCOUNTER — Encounter: Payer: Self-pay | Admitting: *Deleted

## 2014-01-20 ENCOUNTER — Encounter (INDEPENDENT_AMBULATORY_CARE_PROVIDER_SITE_OTHER): Payer: Commercial Managed Care - HMO

## 2014-01-20 ENCOUNTER — Ambulatory Visit (HOSPITAL_COMMUNITY): Payer: Medicare HMO | Attending: Internal Medicine | Admitting: *Deleted

## 2014-01-20 DIAGNOSIS — E785 Hyperlipidemia, unspecified: Secondary | ICD-10-CM | POA: Insufficient documentation

## 2014-01-20 DIAGNOSIS — R002 Palpitations: Secondary | ICD-10-CM

## 2014-01-20 DIAGNOSIS — R601 Generalized edema: Secondary | ICD-10-CM

## 2014-01-20 DIAGNOSIS — I82403 Acute embolism and thrombosis of unspecified deep veins of lower extremity, bilateral: Secondary | ICD-10-CM

## 2014-01-20 DIAGNOSIS — R609 Edema, unspecified: Secondary | ICD-10-CM | POA: Diagnosis not present

## 2014-01-20 DIAGNOSIS — I872 Venous insufficiency (chronic) (peripheral): Secondary | ICD-10-CM

## 2014-01-20 DIAGNOSIS — I480 Paroxysmal atrial fibrillation: Secondary | ICD-10-CM

## 2014-01-20 DIAGNOSIS — E119 Type 2 diabetes mellitus without complications: Secondary | ICD-10-CM | POA: Insufficient documentation

## 2014-01-20 DIAGNOSIS — I10 Essential (primary) hypertension: Secondary | ICD-10-CM | POA: Insufficient documentation

## 2014-01-20 NOTE — Progress Notes (Signed)
Patient ID: Sierra Ray, female   DOB: 27-Mar-1946, 68 y.o.   MRN: [id number] [facility] 24 hour holter monitor applied to patient.

## 2014-01-20 NOTE — Progress Notes (Signed)
Bilateral lower extremity venous complete.

## 2014-01-20 NOTE — Telephone Encounter (Signed)
Message copied by Nuala Alpha on Wed Jan 20, 2014  5:27 PM ------      Message from: Dorothy Spark      Created: Wed Jan 20, 2014  5:05 PM      Regarding: FW: Venous Prelim       Negative venous Duplex for this patient            ----- Message -----         From: Glean Salen         Sent: 01/20/2014  12:02 PM           To: Dorothy Spark, MD      Subject: Venous Prelim                                            Venous duplex of the bilateral lower extremities appears negative for acute DVT.            Thank you.        ------

## 2014-01-20 NOTE — Telephone Encounter (Signed)
Pt notified of negative venous duplex per Dr [name].  Pt verbalized understanding and pleased with this news.

## 2014-01-22 ENCOUNTER — Ambulatory Visit (HOSPITAL_COMMUNITY): Payer: Medicare HMO | Attending: Internal Medicine | Admitting: Radiology

## 2014-01-22 VITALS — BP 112/79 | HR 67 | Ht 66.0 in | Wt 168.0 lb

## 2014-01-22 DIAGNOSIS — E119 Type 2 diabetes mellitus without complications: Secondary | ICD-10-CM | POA: Insufficient documentation

## 2014-01-22 DIAGNOSIS — I10 Essential (primary) hypertension: Secondary | ICD-10-CM | POA: Insufficient documentation

## 2014-01-22 DIAGNOSIS — R42 Dizziness and giddiness: Secondary | ICD-10-CM | POA: Diagnosis not present

## 2014-01-22 DIAGNOSIS — R0600 Dyspnea, unspecified: Secondary | ICD-10-CM | POA: Insufficient documentation

## 2014-01-22 DIAGNOSIS — R002 Palpitations: Secondary | ICD-10-CM | POA: Diagnosis not present

## 2014-01-22 DIAGNOSIS — R079 Chest pain, unspecified: Secondary | ICD-10-CM | POA: Insufficient documentation

## 2014-01-22 DIAGNOSIS — R0789 Other chest pain: Secondary | ICD-10-CM

## 2014-01-22 DIAGNOSIS — R0609 Other forms of dyspnea: Secondary | ICD-10-CM

## 2014-01-22 MED ORDER — REGADENOSON 0.4 MG/5ML IV SOLN
0.4000 mg | Freq: Once | INTRAVENOUS | Status: AC
  Administered 2014-01-22: 0.4 mg via INTRAVENOUS

## 2014-01-22 MED ORDER — TECHNETIUM TC 99M SESTAMIBI GENERIC - CARDIOLITE
10.0000 | Freq: Once | INTRAVENOUS | Status: AC | PRN
  Administered 2014-01-22: 10 via INTRAVENOUS

## 2014-01-22 MED ORDER — TECHNETIUM TC 99M SESTAMIBI GENERIC - CARDIOLITE
30.0000 | Freq: Once | INTRAVENOUS | Status: AC | PRN
  Administered 2014-01-22: 30 via INTRAVENOUS

## 2014-01-22 NOTE — Progress Notes (Signed)
[facility] [address] [place], [place] [address] [phone]    Cardiology Nuclear Med Sierra Ray is a 68 y.o. female     MRN : [id number]     DOB: 10-Mar-1946  Procedure Date: 01/22/2014  Nuclear Med Background Indication for Stress Test:  Evaluation for Ischemia and Abnormal EKG History:  no prior cardiac hx Cardiac Risk Factors: Hypertension and NIDDM  Symptoms:  Chest Pain, Dizziness, DOE, Fatigue and Palpitations   Nuclear Pre-Procedure Caffeine/Decaff Intake:  None NPO After: 6:30pm   Lungs:  clear O2 Sat: 98% on room air. IV 0.9% NS with Angio Cath:  20g  IV Site: L Antecubital  IV Started by:  Ileene Hutchinson, EMT-P  Chest Size (in):  38 Cup Size: DDD  Height: 5\' 6"  (1.676 m)  Weight:  168 lb (76.204 kg)  BMI:  Body mass index is 27.13 kg/(m^2). Tech Comments:  Held metformin this am    Nuclear Med Study 1 or 2 day study: 1 day  Stress Test Type:  Lexiscan  Reading MD: Jenkins Rouge, MD  Order Authorizing Provider:  Liane Comber, MD  Resting Radionuclide: Technetium 66m Sestamibi  Resting Radionuclide Dose: 11.0 mCi   Stress Radionuclide:  Technetium 74m Sestamibi  Stress Radionuclide Dose: 33.0 mCi           Stress Protocol Rest HR: 67 Stress HR: 96  Rest BP: 112/79 Stress BP: 131/67  Exercise Time (min): n/a METS: n/a           Dose of Adenosine (mg):  n/a Dose of Lexiscan: 0.4 mg  Dose of Atropine (mg): n/a Dose of Dobutamine: n/a mcg/kg/min (at max HR)  Stress Test Technologist: Glade Lloyd, BS-ES  Nuclear Technologist:  Earl Many, CNMT     Rest Procedure:  Myocardial perfusion imaging was performed at rest 45 minutes following the intravenous administration of Technetium 13m Sestamibi. Rest ECG: NSR - Normal EKG  Stress Procedure:  The patient received IV Lexiscan 0.4 mg over 15-seconds.  Technetium 21m Sestamibi injected at 30-seconds.  Quantitative spect images were obtained after a 45 minute  delay.  During the infusion of Lexiscan the patient complained of SOB, chest tightness, stomach ache and head pain.  These symptoms began to resolve in recovery.  Stress ECG: No significant change from baseline ECG  QPS Raw Data Images:  Patient motion noted. Stress Images:  Normal homogeneous uptake in all areas of the myocardium. Rest Images:  Normal homogeneous uptake in all areas of the myocardium. Subtraction (SDS):  No evidence of ischemia. Transient Ischemic Dilatation (Normal <1.22):  1.03 Lung/Heart Ratio (Normal <0.45):  0.30  Quantitative Gated Spect Images QGS EDV:  65 ml QGS ESV:  25 ml  Impression Exercise Capacity:  Lexiscan with no exercise. BP Response:  Normal blood pressure response. Clinical Symptoms:  There is dyspnea. ECG Impression:  No significant ST segment change suggestive of ischemia. Comparison with Prior Nuclear Study: No previous nuclear study performed  Overall Impression:  Normal stress nuclear study.  LV Ejection Fraction: 62%.  LV Wall Motion:  NL LV Function; NL Wall Motion      Jenkins Rouge

## 2014-01-29 ENCOUNTER — Other Ambulatory Visit: Payer: Self-pay

## 2014-02-03 ENCOUNTER — Telehealth: Payer: Self-pay | Admitting: *Deleted

## 2014-02-03 MED ORDER — METOPROLOL TARTRATE 25 MG PO TABS: 12.5000 mg | ORAL_TABLET | Freq: Two times a day (BID) | ORAL | Status: DC

## 2014-02-03 NOTE — Telephone Encounter (Signed)
Notified the pt of her 24 hour holter monitor results.  Informed the pt that per Dr [name], she had frequent PACs, and recommends her to start taking Metoprolol 12.5 mg po BID.  Pt requested a hard script for this, as she is in the process of choosing a new pharmacy in her area. Wrote pt hard script for the pt to take metoprolol 12.5 mg po BID, and signed by Dr [name].  Pt verbalized understanding and agrees with this plan.  Pt will pick med script up tomorrow 02/04/14 at the front desk.

## 2014-02-04 ENCOUNTER — Telehealth: Payer: Self-pay | Admitting: Cardiology

## 2014-02-04 NOTE — Telephone Encounter (Signed)
Informed the pt that her medication is at the front desk and ready to pick up and her diagnosis was PACs, which is why Dr [name] prescribed the pt metoprolol 25 mg BID.  Provided pt education on what PACs are and the causes.  Pt verbalized understanding, gracious for all the assistance provided, and agrees with the plan.

## 2014-02-04 NOTE — Telephone Encounter (Signed)
New message     Want to remind nurse to leave presc up front and her daughter will pick it up. Also, pt want to know what her diagnosis is.

## 2014-02-09 ENCOUNTER — Ambulatory Visit: Payer: Commercial Managed Care - HMO | Admitting: Cardiology

## 2014-02-15 ENCOUNTER — Encounter: Payer: Self-pay | Admitting: Cardiology

## 2014-02-18 ENCOUNTER — Other Ambulatory Visit: Payer: Self-pay

## 2014-02-18 MED ORDER — METOPROLOL TARTRATE 25 MG PO TABS: 12.5000 mg | ORAL_TABLET | Freq: Two times a day (BID) | ORAL | Status: DC

## 2014-02-19 ENCOUNTER — Other Ambulatory Visit: Payer: Self-pay

## 2014-02-19 MED ORDER — METOPROLOL TARTRATE 25 MG PO TABS: 12.5000 mg | ORAL_TABLET | Freq: Two times a day (BID) | ORAL | Status: DC

## 2014-03-15 ENCOUNTER — Ambulatory Visit: Payer: Commercial Managed Care - HMO | Admitting: *Deleted

## 2014-04-26 ENCOUNTER — Ambulatory Visit: Payer: Commercial Managed Care - HMO

## 2014-04-29 ENCOUNTER — Ambulatory Visit: Payer: Commercial Managed Care - HMO | Admitting: *Deleted

## 2014-05-11 DIAGNOSIS — H578 Other specified disorders of eye and adnexa: Secondary | ICD-10-CM | POA: Diagnosis not present

## 2014-05-20 DIAGNOSIS — G5600 Carpal tunnel syndrome, unspecified upper limb: Secondary | ICD-10-CM | POA: Insufficient documentation

## 2014-05-20 DIAGNOSIS — I491 Atrial premature depolarization: Secondary | ICD-10-CM | POA: Diagnosis not present

## 2014-06-01 DIAGNOSIS — J310 Chronic rhinitis: Secondary | ICD-10-CM | POA: Diagnosis not present

## 2014-06-01 DIAGNOSIS — R490 Dysphonia: Secondary | ICD-10-CM | POA: Diagnosis not present

## 2014-06-15 ENCOUNTER — Ambulatory Visit: Payer: Commercial Managed Care - HMO | Admitting: Internal Medicine

## 2014-06-16 ENCOUNTER — Ambulatory Visit: Payer: Commercial Managed Care - HMO | Admitting: Internal Medicine

## 2014-06-30 ENCOUNTER — Ambulatory Visit: Payer: Commercial Managed Care - HMO | Admitting: Internal Medicine

## 2014-07-02 DIAGNOSIS — R0989 Other specified symptoms and signs involving the circulatory and respiratory systems: Secondary | ICD-10-CM | POA: Diagnosis not present

## 2014-07-02 DIAGNOSIS — J309 Allergic rhinitis, unspecified: Secondary | ICD-10-CM | POA: Diagnosis not present

## 2014-07-02 DIAGNOSIS — R0981 Nasal congestion: Secondary | ICD-10-CM | POA: Diagnosis not present

## 2014-07-02 DIAGNOSIS — K219 Gastro-esophageal reflux disease without esophagitis: Secondary | ICD-10-CM | POA: Diagnosis not present

## 2014-07-28 ENCOUNTER — Ambulatory Visit: Payer: Commercial Managed Care - HMO | Admitting: Cardiology

## 2014-07-30 DIAGNOSIS — E782 Mixed hyperlipidemia: Secondary | ICD-10-CM | POA: Diagnosis not present

## 2014-07-30 DIAGNOSIS — R252 Cramp and spasm: Secondary | ICD-10-CM | POA: Diagnosis not present

## 2014-07-30 DIAGNOSIS — I10 Essential (primary) hypertension: Secondary | ICD-10-CM | POA: Diagnosis not present

## 2014-07-30 DIAGNOSIS — R5383 Other fatigue: Secondary | ICD-10-CM | POA: Diagnosis not present

## 2014-07-30 DIAGNOSIS — R109 Unspecified abdominal pain: Secondary | ICD-10-CM | POA: Diagnosis not present

## 2014-07-30 DIAGNOSIS — M150 Primary generalized (osteo)arthritis: Secondary | ICD-10-CM | POA: Diagnosis not present

## 2014-07-30 DIAGNOSIS — E1142 Type 2 diabetes mellitus with diabetic polyneuropathy: Secondary | ICD-10-CM | POA: Diagnosis not present

## 2014-07-30 DIAGNOSIS — R490 Dysphonia: Secondary | ICD-10-CM | POA: Diagnosis not present

## 2014-08-02 DIAGNOSIS — K219 Gastro-esophageal reflux disease without esophagitis: Secondary | ICD-10-CM | POA: Diagnosis not present

## 2014-08-02 DIAGNOSIS — R1084 Generalized abdominal pain: Secondary | ICD-10-CM | POA: Diagnosis not present

## 2014-08-02 DIAGNOSIS — E119 Type 2 diabetes mellitus without complications: Secondary | ICD-10-CM | POA: Diagnosis not present

## 2014-08-03 ENCOUNTER — Other Ambulatory Visit: Payer: Self-pay | Admitting: Obstetrics and Gynecology

## 2014-08-03 DIAGNOSIS — Z1231 Encounter for screening mammogram for malignant neoplasm of breast: Secondary | ICD-10-CM

## 2014-08-09 ENCOUNTER — Ambulatory Visit (INDEPENDENT_AMBULATORY_CARE_PROVIDER_SITE_OTHER): Payer: Commercial Managed Care - HMO | Admitting: Podiatry

## 2014-08-09 ENCOUNTER — Ambulatory Visit (HOSPITAL_COMMUNITY): Payer: Commercial Managed Care - HMO

## 2014-08-09 DIAGNOSIS — M79673 Pain in unspecified foot: Secondary | ICD-10-CM

## 2014-08-09 DIAGNOSIS — B351 Tinea unguium: Secondary | ICD-10-CM | POA: Diagnosis not present

## 2014-08-09 NOTE — Progress Notes (Signed)
Subjective:     Patient ID: Sierra Ray, female   DOB: October 27, 1945, 69 y.o.   MRN: [id number]  HPI patient presents with painful nailbeds 1-5 both feet that are thick and she cannot cut herself   Review of Systems     Objective:   Physical Exam Neurovascular status intact with thick yellow brittle nailbeds 1-5 both feet    Assessment:     Mycotic nail infection is with pain 1-5 both feet    Plan:     Debride painful nailbeds 1-5 both feet with no iatrogenic bleeding noted

## 2014-08-10 ENCOUNTER — Ambulatory Visit (HOSPITAL_COMMUNITY)
Admission: RE | Admit: 2014-08-10 | Discharge: 2014-08-10 | Disposition: A | Discharge status: Home / Self Care | Payer: Commercial Managed Care - HMO | Source: Ambulatory Visit | Attending: Obstetrics and Gynecology | Admitting: Obstetrics and Gynecology

## 2014-08-10 DIAGNOSIS — Z1231 Encounter for screening mammogram for malignant neoplasm of breast: Secondary | ICD-10-CM

## 2014-08-12 DIAGNOSIS — H25812 Combined forms of age-related cataract, left eye: Secondary | ICD-10-CM | POA: Diagnosis not present

## 2014-08-12 DIAGNOSIS — E119 Type 2 diabetes mellitus without complications: Secondary | ICD-10-CM | POA: Diagnosis not present

## 2014-08-12 DIAGNOSIS — H04123 Dry eye syndrome of bilateral lacrimal glands: Secondary | ICD-10-CM | POA: Diagnosis not present

## 2014-08-12 DIAGNOSIS — H2511 Age-related nuclear cataract, right eye: Secondary | ICD-10-CM | POA: Diagnosis not present

## 2014-08-20 ENCOUNTER — Ambulatory Visit: Payer: Commercial Managed Care - HMO | Admitting: Cardiology

## 2014-08-26 ENCOUNTER — Encounter: Payer: Self-pay | Admitting: Cardiology

## 2014-08-26 ENCOUNTER — Ambulatory Visit: Payer: Commercial Managed Care - HMO | Admitting: Cardiology

## 2014-08-30 DIAGNOSIS — K219 Gastro-esophageal reflux disease without esophagitis: Secondary | ICD-10-CM | POA: Diagnosis not present

## 2014-09-09 DIAGNOSIS — M2550 Pain in unspecified joint: Secondary | ICD-10-CM | POA: Diagnosis not present

## 2014-09-09 DIAGNOSIS — M791 Myalgia: Secondary | ICD-10-CM | POA: Diagnosis not present

## 2014-09-09 DIAGNOSIS — R5383 Other fatigue: Secondary | ICD-10-CM | POA: Diagnosis not present

## 2014-09-09 DIAGNOSIS — M150 Primary generalized (osteo)arthritis: Secondary | ICD-10-CM | POA: Diagnosis not present

## 2014-09-09 DIAGNOSIS — R531 Weakness: Secondary | ICD-10-CM | POA: Diagnosis not present

## 2014-09-14 ENCOUNTER — Other Ambulatory Visit: Payer: Self-pay | Admitting: *Deleted

## 2014-09-14 DIAGNOSIS — M791 Myalgia, unspecified site: Secondary | ICD-10-CM

## 2014-09-23 ENCOUNTER — Ambulatory Visit: Payer: Commercial Managed Care - HMO | Admitting: Cardiology

## 2014-09-28 ENCOUNTER — Ambulatory Visit (INDEPENDENT_AMBULATORY_CARE_PROVIDER_SITE_OTHER): Payer: Commercial Managed Care - HMO | Admitting: Neurology

## 2014-09-28 DIAGNOSIS — M791 Myalgia, unspecified site: Secondary | ICD-10-CM

## 2014-09-28 DIAGNOSIS — M5417 Radiculopathy, lumbosacral region: Secondary | ICD-10-CM

## 2014-09-28 NOTE — Procedures (Signed)
[facility]  [address]  [place], [place] [address] Tel: [phone] Fax:  [phone] Test Date:  09/28/2014  Patient: Sierra Ray DOB: January 23, 1946 Physician: Narda Amber  Sex: Female Height: 5\' 6"  Ref Phys: Donnie Coffin  ID#: [id number]   Technician: Jerilynn Mages. Dean   Patient Complaints: This is a 69 year old female with generalized myalgias and paresthesias involveing the arms and legs referred for evaluation of myositis.   NCV & EMG Findings: Extensive electrodiagnostic testing of the left upper and lower extremities shows:  1. Left median, radial, ulnar, and palmar sensory responses are within normal limits. 2. Left median and ulnar motor responses are within normal limits. 3. Left sural and superficial peroneal sensory responses are within normal limits. 4. Left peroneal and tibial motor responses are within normal limits. 5. In the upper extremities, there is no evidence of active or chronic motor axon loss changes affecting any of the tested muscles. Motor unit configuration and recruitment pattern is within normal limits. 6. In the lower extremities, neurogenic changes are seen affecting the L5 and S1 myotomes bilaterally, without accompanied active denervation.  Impression: 1. Chronic L5-S1 radiculopathies affecting bilateral lower extremities, mild in degree electrically. 2. There is no evidence of a generalized sensorimotor polyneuropathy, diffuse myopathy, or cervical radiculopathy affecting the left side.   ___________________________ Narda Amber    Nerve Conduction Studies Anti Sensory Summary Table  Stim Site NR Peak (ms) Norm Peak (ms) P-T Amp (V) Norm P-T Amp  Left Median Anti Sensory (2nd Digit)  33.4C  Wrist    3.2 <3.8 32.2 >10  Left Radial Anti Sensory (Base 1st Digit)  33.4C  Wrist    2.0 <2.8 46.7 >10  Left Sup Peroneal Anti Sensory (Ant Lat Mall)  33.4C  12 cm    2.6 <4.6 10.9 >3  Left Sural Anti Sensory (Lat Mall)   33.4C  Calf    2.9 <4.6 15.5 >3  Left Ulnar Anti Sensory (5th Digit)  33.4C  Wrist    2.8 <3.2 45.3 >5   Motor Summary Table  Stim Site NR Onset (ms) Norm Onset (ms) O-P Amp (mV) Norm O-P Amp Site1 Site2 Delta-0 (ms) Dist (cm) Vel (m/s) Norm Vel (m/s)  Left Median Motor (Abd Poll Brev)  33.4C  Wrist    3.6 <4.0 5.4 >5 Elbow Wrist 3.7 23.0 62 >50  Elbow    7.3  4.9  Axilla Elbow 6.5 0.0    Axilla    13.8  0.0         Left Peroneal Motor (Ext Dig Brev)  33.4C  Ankle    3.4 <6.0 4.9 >2.5 B Fib Ankle 7.5 33.0 44 >40  B Fib    10.9  4.4  Poplt B Fib 1.8 10.0 56 >40  Poplt    12.7  4.4         Left Peroneal TA Motor (Tib Ant)  33.4C  Fib Head    3.7 <4.5 4.5 >3 Poplit Fib Head 1.4 8.0 57 >40  Poplit    5.1  4.4         Left Tibial Motor (Abd Hall Brev)  33.4C  Ankle    4.0 <6.0 5.4 >4 Knee Ankle 9.8 41.0 42 >40  Knee    13.8  3.6         Left Ulnar Motor (Abd Dig Minimi)  33.4C  Wrist    2.1 <3.1 8.0 >7 B Elbow Wrist 3.4 21.0 62 >50  B Elbow    5.5  7.8  A Elbow B Elbow 1.5 10.0 67 >50  A Elbow    7.0  7.1          Comparison Summary Table  Stim Site NR Peak (ms) Norm Peak (ms) P-T Amp (V) Site1 Site2 Delta-P (ms) Norm Delta (ms)  Left Median/Ulnar Palm Comparison (Wrist - 8cm)  33.4C  Median Palm    2.1 <2.2 44.6 Median Palm Ulnar Palm 0.1   Ulnar Palm    2.0 <2.2 25.7       EMG  Side Muscle Ins Act Fibs Psw Fasc Number Recrt Dur Dur. Amp Amp. Poly Poly. Comment  Left 1stDorInt Nml Nml Nml Nml Nml Nml Nml Nml Nml Nml Nml Nml N/A  Left FlexPolLong Nml Nml Nml Nml Nml Nml Nml Nml Nml Nml Nml Nml N/A  Left PronatorTeres Nml Nml Nml Nml Nml Nml Nml Nml Nml Nml Nml Nml N/A  Left Biceps Nml Nml Nml Nml Nml Nml Nml Nml Nml Nml Nml Nml N/A  Left Triceps Nml Nml Nml Nml Nml Nml Nml Nml Nml Nml Nml Nml N/A  Left Deltoid Nml Nml Nml Nml Nml Nml Nml Nml Nml Nml Nml Nml N/A  Left Cervical Parasp Low Nml Nml Nml Nml Nml Nml Nml Nml Nml Nml Nml Nml N/A  Left AntTibialis Nml Nml Nml Nml  1- Rapid Some 1+ Few 1+ Nml Nml N/A  Left Gastroc Nml Nml Nml Nml 1- Mod-R Some 1+ Few 1+ Nml Nml N/A  Left Flex Dig Long Nml Nml Nml Nml Nml Nml Nml Nml Nml Nml Nml Nml N/A  Left RectFemoris Nml Nml Nml Nml 2- Mod-V Nml Nml Nml Nml Nml Nml N/A  Left GluteusMed Nml Nml Nml Nml 1- Rapid Some 1+ Some 1+ Nml Nml N/A  Left Lumbo Parasp Low Nml Nml Nml Nml Nml Nml Nml Nml Nml Nml Nml Nml N/A  Left BicepsFemS Nml Nml Nml Nml 1- Rapid Some 1+ Some 1+ Nml Nml N/A  Left GluteusMin Nml Nml Nml Nml Nml Nml Nml Nml Nml Nml Nml Nml N/A  Right AntTibialis Nml Nml Nml Nml 1- Rapid Some 1+ Some 1+ Nml Nml N/A  Right Gastroc Nml Nml Nml Nml 2- Rapid Some 1+ Some 1+ Nml Nml N/A      Waveforms:

## 2014-10-01 DIAGNOSIS — N644 Mastodynia: Secondary | ICD-10-CM | POA: Diagnosis not present

## 2014-10-01 DIAGNOSIS — Z01411 Encounter for gynecological examination (general) (routine) with abnormal findings: Secondary | ICD-10-CM | POA: Diagnosis not present

## 2014-10-05 DIAGNOSIS — M791 Myalgia: Secondary | ICD-10-CM | POA: Diagnosis not present

## 2014-10-05 DIAGNOSIS — M150 Primary generalized (osteo)arthritis: Secondary | ICD-10-CM | POA: Diagnosis not present

## 2014-10-05 DIAGNOSIS — M2550 Pain in unspecified joint: Secondary | ICD-10-CM | POA: Diagnosis not present

## 2014-10-05 DIAGNOSIS — R5383 Other fatigue: Secondary | ICD-10-CM | POA: Diagnosis not present

## 2014-10-11 ENCOUNTER — Other Ambulatory Visit: Payer: Self-pay

## 2015-01-31 DIAGNOSIS — E782 Mixed hyperlipidemia: Secondary | ICD-10-CM | POA: Diagnosis not present

## 2015-01-31 DIAGNOSIS — N951 Menopausal and female climacteric states: Secondary | ICD-10-CM | POA: Diagnosis not present

## 2015-01-31 DIAGNOSIS — R490 Dysphonia: Secondary | ICD-10-CM | POA: Diagnosis not present

## 2015-01-31 DIAGNOSIS — R252 Cramp and spasm: Secondary | ICD-10-CM | POA: Diagnosis not present

## 2015-01-31 DIAGNOSIS — E1142 Type 2 diabetes mellitus with diabetic polyneuropathy: Secondary | ICD-10-CM | POA: Diagnosis not present

## 2015-01-31 DIAGNOSIS — I10 Essential (primary) hypertension: Secondary | ICD-10-CM | POA: Diagnosis not present

## 2015-01-31 DIAGNOSIS — M150 Primary generalized (osteo)arthritis: Secondary | ICD-10-CM | POA: Diagnosis not present

## 2015-01-31 DIAGNOSIS — R109 Unspecified abdominal pain: Secondary | ICD-10-CM | POA: Diagnosis not present

## 2015-01-31 LAB — BASIC METABOLIC PANEL
BUN: 20 mg/dL (ref 4–21)
Creatinine: 0.9 mg/dL (ref 0.5–1.1)
Glucose: 106 mg/dL
Glucose: 106 mg/dL
Potassium: 4.2 mmol/L (ref 3.4–5.3)
Sodium: 140 mmol/L (ref 137–147)

## 2015-01-31 LAB — HEPATIC FUNCTION PANEL
ALT: 11 U/L (ref 7–35)
AST: 14 U/L (ref 13–35)
Alkaline Phosphatase: 50 U/L (ref 25–125)
Bilirubin, Total: 0.5 mg/dL

## 2015-01-31 LAB — LIPID PANEL
Cholesterol: 247 mg/dL — AB (ref 0–200)
Triglycerides: 221 mg/dL — AB (ref 40–160)

## 2015-01-31 LAB — HEMOGLOBIN A1C
Hemoglobin A1C: 6.6
Hemoglobin A1C: 6.6

## 2015-02-02 DIAGNOSIS — M8589 Other specified disorders of bone density and structure, multiple sites: Secondary | ICD-10-CM | POA: Diagnosis not present

## 2015-02-02 DIAGNOSIS — M859 Disorder of bone density and structure, unspecified: Secondary | ICD-10-CM | POA: Diagnosis not present

## 2015-02-02 DIAGNOSIS — Z780 Asymptomatic menopausal state: Secondary | ICD-10-CM | POA: Diagnosis not present

## 2015-02-02 LAB — HM DEXA SCAN

## 2015-02-09 DIAGNOSIS — R8290 Unspecified abnormal findings in urine: Secondary | ICD-10-CM | POA: Diagnosis not present

## 2015-02-09 DIAGNOSIS — R102 Pelvic and perineal pain: Secondary | ICD-10-CM | POA: Diagnosis not present

## 2015-05-04 ENCOUNTER — Ambulatory Visit: Payer: Commercial Managed Care - HMO | Admitting: Internal Medicine

## 2015-05-27 ENCOUNTER — Encounter: Payer: Self-pay | Admitting: Internal Medicine

## 2015-05-27 ENCOUNTER — Ambulatory Visit (INDEPENDENT_AMBULATORY_CARE_PROVIDER_SITE_OTHER): Payer: Commercial Managed Care - HMO | Admitting: Internal Medicine

## 2015-05-27 VITALS — BP 112/82 | HR 82 | Temp 98.0°F | Resp 20 | Ht 66.0 in | Wt 162.8 lb

## 2015-05-27 DIAGNOSIS — E1142 Type 2 diabetes mellitus with diabetic polyneuropathy: Secondary | ICD-10-CM | POA: Diagnosis not present

## 2015-05-27 DIAGNOSIS — K219 Gastro-esophageal reflux disease without esophagitis: Secondary | ICD-10-CM

## 2015-05-27 DIAGNOSIS — E785 Hyperlipidemia, unspecified: Secondary | ICD-10-CM

## 2015-05-27 DIAGNOSIS — K5900 Constipation, unspecified: Secondary | ICD-10-CM

## 2015-05-27 DIAGNOSIS — K5909 Other constipation: Secondary | ICD-10-CM

## 2015-05-27 DIAGNOSIS — I10 Essential (primary) hypertension: Secondary | ICD-10-CM | POA: Diagnosis not present

## 2015-05-27 DIAGNOSIS — I491 Atrial premature depolarization: Secondary | ICD-10-CM

## 2015-05-27 NOTE — Patient Instructions (Addendum)
Start dexilant daily for stomach  Continue other medications as ordered  Will call with lab results  She declined influenza vaccine  Follow up in about 1 month for CPE/MMSE/ECG

## 2015-05-27 NOTE — Progress Notes (Signed)
Patient ID: Sierra Ray, female   DOB: [birth year], 70 y.o.   MRN: [id number]    Location:    [facility]   Place of Service:   OFFICE   Advanced Directive information Does patient have an advance directive?: Yes, Type of Advance Directive: [place], Does patient want to make changes to advanced directive?: No - Patient declined  Chief Complaint  Patient presents with  . Establish Care     New Patient Establish Care  . OTHER    Patients says she has problems with her memory    HPI:  70 yo female seen today as a new pt. She c/o abdominal discomfort with associated burning, belching, constipation. No N/Ray, hematemesis, blood stools. She was seen by GI in 2011 and had EGD and colonoscopy by [name]. EGD nml but recommended PPI like dexilant. She was unable to afford medicine but did take carafate which has not helped. Colonoscopy revealed multiple polyps and she was told to repeat in 3-5 yrs. She takes miralax prn  She c/o short term memory issues x several yrs and has gotten worse over the last 3-5 yrs. She declines any meds  She feels depressed but does not want to take any meds. She been to counseling in the past. No SI/HI. She has anhedonia and increased fatigue  Palpitations - ECG revealed PACs at cardio office in 2015. Takes metoprolol  HTN - BP controlled on amlodipine  Chronic pain - thoracic, lower back and b/l LE. She has had EMG/NCS in the past. She has never seen a neurologist. Has seen rheum in the past but that provider ([name]) is no longer in the area. Takes diclofenac prn  DM - takes metformin. Last A1c 6.8% on Oct 2016. She has neuropathy and takes gabapentin  Hyperlipidemia - diet controlled. Last checked in Oct 2016. Lipitor, zocor, pravachol caused myalgias.  Asthma/seasonal allergy - seasonal wheezing. Takes  And claritin prn   Past Medical History  Diagnosis Date  . Diabetes mellitus type 2 with neurological manifestations ([place])   .  Hypertension   . Hyperlipidemia   . Asthma   . Hx of seasonal allergies   . Arthritis   . GERD (gastroesophageal reflux disease)   . Abnormal Pap smear 05/08/2011    ASC-US +HPV  . S/P total hysterectomy 06/21/2011  . IBS (irritable bowel syndrome)   . Diabetic peripheral neuropathy [facility])     Past Surgical History  Procedure Laterality Date  . Vaginal hysterectomy  1983  . Ovarian cyst surgery  1986  . Tonsilectomy, adenoidectomy, bilateral myringotomy and tubes  1974    Patient Care Team: L.Donnie Coffin, MD as PCP - General (Family Medicine)  Social History   Social History  . Marital Status: Divorced    Spouse Name: N/A  . Number of Children: 4  . Years of Education: N/A   Occupational History  .     Social History Main Topics  . Smoking status: Never Smoker   . Smokeless tobacco: Never Used  . Alcohol Use: No  . Drug Use: No  . Sexual Activity: Not Currently    Birth Control/ Protection: Surgical   Other Topics Concern  . Not on file   Social History Narrative     reports that she has never smoked. She has never used smokeless tobacco. She reports that she does not drink alcohol or use illicit drugs.  Family History  Problem Relation Age of Onset  . Diabetes Father   .  Heart disease Father   . Diabetes Mother   . Pancreatic cancer Brother   . Pancreatic cancer Sister    Family Status  Relation Status Death Age  . Father Deceased   . Mother Deceased   . Brother Deceased   . Sister Deceased   . Son Alive   . Daughter Alive   . Daughter Alive   . Daughter Alive      There is no immunization history on file for this patient.  Allergies  Allergen Reactions  . Aspirin     Medications: Patient's Medications  New Prescriptions   No medications on file  Previous Medications   ACCU-CHEK AVIVA PLUS TEST STRIP    As directed for Diabetes   ACCU-CHEK SOFTCLIX LANCETS LANCETS    As directed for Diabetes   ALCOHOL SWABS (B-D SINGLE USE SWABS  REGULAR) PADS    As directed for diabetes   AMLODIPINE (NORVASC) 5 MG TABLET    Take 1 tablet (5 mg total) by mouth daily.   CHOLECALCIFEROL (VITAMIN D) 1000 UNITS TABLET    Take 1,000 Units by mouth daily.    DICLOFENAC (VOLTAREN) 75 MG EC TABLET    Take 75 mg by mouth daily as needed.    FLUTICASONE (FLONASE) 50 MCG/ACT NASAL SPRAY    Place 2 sprays into the nose daily as needed.   GABAPENTIN (NEURONTIN) 400 MG CAPSULE    Take 400 mg by mouth 2 (two) times daily.   LORATADINE (CLARITIN) 10 MG TABLET    Take 10 mg by mouth daily.   METFORMIN (GLUCOPHAGE) 500 MG TABLET    Take 1,000 mg by mouth 2 (two) times daily with a meal.   METOPROLOL TARTRATE (LOPRESSOR) 25 MG TABLET    Take 0.5 tablets (12.5 mg total) by mouth 2 (two) times daily.   MULTIPLE VITAMINS-MINERALS (ICAPS AREDS FORMULA PO)    Take by mouth daily.   MULTIPLE VITAMINS-MINERALS (MULTIVITAMIN WITH MINERALS) TABLET    Take 1 tablet by mouth daily.   OMEGA-3 FATTY ACIDS (FISH OIL) 1000 MG CAPS    Take 2 capsules by mouth daily.   OVER THE COUNTER MEDICATION    1 drop as needed. For dry eyes. Equate Brand Restore Tears Lubricant Eye Drops   POLYETHYLENE GLYCOL (MIRALAX / GLYCOLAX) PACKET    Take 17 g by mouth daily as needed. For constipation   PROBIOTIC PRODUCT (PROBIOTIC DAILY PO)    Take by mouth.   SUCRALFATE (CARAFATE) 1 G TABLET    Take 1 tablet by mouth four times daily before meals and at bedtime  Modified Medications   No medications on file  Discontinued Medications   METFORMIN (GLUCOPHAGE-XR) 500 MG 24 HR TABLET    Take 1 tablet (500 mg total) by mouth 2 (two) times daily.   PRAVASTATIN (PRAVACHOL) 40 MG TABLET    Take 40 mg by mouth daily. Reported on 05/27/2015    Review of Systems  Respiratory: Positive for wheezing.   Cardiovascular: Positive for palpitations.  Gastrointestinal: Positive for constipation.  Musculoskeletal: Positive for back pain and arthralgias.  Neurological: Positive for numbness.    Psychiatric/Behavioral: Positive for dysphoric mood.  All other systems reviewed and are negative.   Filed Vitals:   05/27/15 0833  BP: 112/82  Pulse: 82  Temp: 98 F (36.7 C)  TempSrc: Oral  Resp: 20  Height: 5\' 6"  (1.676 m)  Weight: 162 lb 12.8 oz (73.846 kg)  SpO2: 97%   Body mass index is 26.29  kg/(m^2).  Physical Exam  Constitutional: She appears well-developed and well-nourished.  HENT:  Mouth/Throat: Oropharynx is clear and moist. No oropharyngeal exudate.  Eyes: Pupils are equal, round, and reactive to light. No scleral icterus.  Neck: Neck supple. Carotid bruit is not present. No tracheal deviation present. No thyromegaly present.  Cardiovascular: Normal rate, regular rhythm, normal heart sounds and intact distal pulses.  Exam reveals no gallop and no friction rub.   No murmur heard. No LE edema b/l. no calf TTP. Medial ankle varicose veins, TTP  Pulmonary/Chest: Effort normal and breath sounds normal. No stridor. No respiratory distress. She has no wheezes. She has no rales.  Abdominal: Soft. Bowel sounds are normal. She exhibits distension. She exhibits no mass. There is no hepatomegaly. There is tenderness (epigastric). There is no rebound and no guarding.  Musculoskeletal: She exhibits edema and tenderness.       Lumbar back: She exhibits decreased range of motion, tenderness and spasm.       Back:  Lymphadenopathy:    She has no cervical adenopathy.  Neurological: She is alert.  Skin: Skin is warm and dry. No rash noted.  Psychiatric: She has a normal mood and affect. Her behavior is normal.     Labs reviewed: No visits with results within 3 Month(s) from this visit. Latest known visit with results is:  Admission on 01/21/2012, Discharged on 01/21/2012  Component Date Value Ref Range Status  . Streptococcus, Group A Screen (Dir* 01/21/2012 NEGATIVE  NEGATIVE Final   Comment:                                 DUE TO INADEQUATE SENSITIVITY OF EIA                           RAPID TESTS FOR GROUP A STREP (GAS)                          IT IS RECOMMENDED THAT ALL NEGATIVE                          RESULTS BE FOLLOWED BY A                          GROUP A STREP PROBE.  Marland Kitchen Glucose-Capillary 01/21/2012 108* 70 - 99 mg/dL Final  . Comment 1 01/21/2012 Notify RN   Final    No results found.   Assessment/Plan   ICD-9-CM ICD-10-CM   1. Type 2 diabetes mellitus with diabetic polyneuropathy, without long-term current use of insulin (HCC) 250.60 E11.42 CMP   357.2  CBC with Differential     Hemoglobin A1c     Urinalysis with Reflex Microscopic     Microalbumin/Creatinine Ratio, Urine  2. Gastroesophageal reflux disease without esophagitis 530.81 K21.9   3. Chronic constipation 564.00 K59.00   4. Hyperlipidemia LDL goal <100 272.4 E78.5 Lipid Panel     TSH  5. Benign essential HTN 401.1 I10   6. PAC (premature atrial contraction) 427.61 I49.1 CBC with Differential   Start dexilant daily for stomach  Continue other medications as ordered  Will call with lab results  She declined influenza vaccine  Follow up in about 1 month for CPE/MMSE/ECG  Tippi Mccrae S. Rae Lips., [facility] [address] [place], [place] [address] [phone] Cell (Monday-Friday 8 AM - 5 PM) [phone] After 5 PM and follow prompts

## 2015-05-28 LAB — URINALYSIS, ROUTINE W REFLEX MICROSCOPIC
Bilirubin, UA: NEGATIVE
Glucose, UA: NEGATIVE
Ketones, UA: NEGATIVE
Leukocytes, UA: NEGATIVE
Nitrite, UA: NEGATIVE
Protein, UA: NEGATIVE
RBC, UA: NEGATIVE
Specific Gravity, UA: 1.02 (ref 1.005–1.030)
Urobilinogen, Ur: 0.2 mg/dL (ref 0.2–1.0)
pH, UA: 6 (ref 5.0–7.5)

## 2015-05-28 LAB — CBC WITH DIFFERENTIAL/PLATELET
Basophils Absolute: 0.1 10*3/uL (ref 0.0–0.2)
Basos: 1 %
EOS (ABSOLUTE): 0.2 10*3/uL (ref 0.0–0.4)
Eos: 4 %
Hematocrit: 40.2 % (ref 34.0–46.6)
Hemoglobin: 13.7 g/dL (ref 11.1–15.9)
Immature Grans (Abs): 0 10*3/uL (ref 0.0–0.1)
Immature Granulocytes: 0 %
Lymphocytes Absolute: 2.5 10*3/uL (ref 0.7–3.1)
Lymphs: 46 %
MCH: 28.4 pg (ref 26.6–33.0)
MCHC: 34.1 g/dL (ref 31.5–35.7)
MCV: 83 fL (ref 79–97)
Monocytes Absolute: 0.5 10*3/uL (ref 0.1–0.9)
Monocytes: 9 %
Neutrophils Absolute: 2.2 10*3/uL (ref 1.4–7.0)
Neutrophils: 40 %
Platelets: 207 10*3/uL (ref 150–379)
RBC: 4.83 x10E6/uL (ref 3.77–5.28)
RDW: 14.2 % (ref 12.3–15.4)
WBC: 5.5 10*3/uL (ref 3.4–10.8)

## 2015-05-28 LAB — LIPID PANEL
Chol/HDL Ratio: 4.4 ratio units (ref 0.0–4.4)
Cholesterol, Total: 232 mg/dL — ABNORMAL HIGH (ref 100–199)
HDL: 53 mg/dL (ref >39)
LDL Calculated: 134 mg/dL — ABNORMAL HIGH (ref 0–99)
Triglycerides: 225 mg/dL — ABNORMAL HIGH (ref 0–149)
VLDL Cholesterol Cal: 45 mg/dL — ABNORMAL HIGH (ref 5–40)

## 2015-05-28 LAB — COMPREHENSIVE METABOLIC PANEL
ALT: 12 IU/L (ref 0–32)
AST: 18 IU/L (ref 0–40)
Albumin/Globulin Ratio: 1.6 (ref 1.1–2.5)
Albumin: 4.3 g/dL (ref 3.6–4.8)
Alkaline Phosphatase: 55 IU/L (ref 39–117)
BUN/Creatinine Ratio: 18 (ref 11–26)
BUN: 16 mg/dL (ref 8–27)
Bilirubin Total: 0.3 mg/dL (ref 0.0–1.2)
CO2: 25 mmol/L (ref 18–29)
Calcium: 9.4 mg/dL (ref 8.7–10.3)
Chloride: 104 mmol/L (ref 96–106)
Creatinine, Ser: 0.89 mg/dL (ref 0.57–1.00)
GFR calc Af Amer: 77 mL/min/{1.73_m2} (ref >59)
GFR calc non Af Amer: 67 mL/min/{1.73_m2} (ref >59)
Globulin, Total: 2.7 g/dL (ref 1.5–4.5)
Glucose: 95 mg/dL (ref 65–99)
Potassium: 4.2 mmol/L (ref 3.5–5.2)
Sodium: 145 mmol/L — ABNORMAL HIGH (ref 134–144)
Total Protein: 7 g/dL (ref 6.0–8.5)

## 2015-05-28 LAB — HEMOGLOBIN A1C
Est. average glucose Bld gHb Est-mCnc: 143 mg/dL
Hgb A1c MFr Bld: 6.6 % — ABNORMAL HIGH (ref 4.8–5.6)

## 2015-05-28 LAB — MICROALBUMIN / CREATININE URINE RATIO
Creatinine, Urine: 187.5 mg/dL
MICROALB/CREAT RATIO: 6.4 mg/g creat (ref 0.0–30.0)
Microalbumin, Urine: 12 ug/mL

## 2015-05-28 LAB — TSH: TSH: 1.26 u[IU]/mL (ref 0.450–4.500)

## 2015-05-30 ENCOUNTER — Telehealth: Payer: Self-pay

## 2015-05-30 DIAGNOSIS — E785 Hyperlipidemia, unspecified: Secondary | ICD-10-CM

## 2015-05-30 MED ORDER — BD SWAB SINGLE USE REGULAR PADS: MEDICATED_PAD | Status: DC

## 2015-05-30 MED ORDER — ROSUVASTATIN CALCIUM 5 MG PO TABS: ORAL_TABLET | ORAL | Status: DC

## 2015-05-30 MED ORDER — DICLOFENAC SODIUM 75 MG PO TBEC: 75.0000 mg | DELAYED_RELEASE_TABLET | Freq: Every day | ORAL | Status: DC | PRN

## 2015-05-30 MED ORDER — METFORMIN HCL 500 MG PO TABS: 1000.0000 mg | ORAL_TABLET | Freq: Two times a day (BID) | ORAL | Status: DC

## 2015-05-30 MED ORDER — AMLODIPINE BESYLATE 5 MG PO TABS
5.0000 mg | ORAL_TABLET | Freq: Every day | ORAL | Status: DC
Start: 2015-05-30 — End: 2016-05-17

## 2015-05-30 MED ORDER — GABAPENTIN 400 MG PO CAPS: 400.0000 mg | ORAL_CAPSULE | Freq: Two times a day (BID) | ORAL | Status: DC

## 2015-05-30 MED ORDER — METOPROLOL TARTRATE 25 MG PO TABS: 12.5000 mg | ORAL_TABLET | Freq: Two times a day (BID) | ORAL | Status: DC

## 2015-05-30 MED ORDER — GLUCOSE BLOOD VI STRP: ORAL_STRIP | Status: DC

## 2015-05-30 MED ORDER — ACCU-CHEK SOFTCLIX LANCETS MISC: Status: DC

## 2015-05-30 NOTE — Telephone Encounter (Signed)
Patient wants Norvasc, Metformin, Metoprolol, Gabapentin, Diclofenac, diabetic test strips, lancets and alcohol swabs faxed to [facility] for 90 day supply with 3 refills. All faxed.

## 2015-05-30 NOTE — Telephone Encounter (Signed)
Spoke with patient about labs, will try Crestor 5mg , if Dr. Eulas Post thinks she should. She tried it about 4 years ago. Will come in 06/29/15 at 9:15 for repeat labs

## 2015-06-06 ENCOUNTER — Telehealth: Payer: Self-pay | Admitting: *Deleted

## 2015-06-06 MED ORDER — DEXLANSOPRAZOLE 30 MG PO CPDR: DELAYED_RELEASE_CAPSULE | ORAL | Status: DC

## 2015-06-06 MED ORDER — METFORMIN HCL ER 500 MG PO TB24: ORAL_TABLET | ORAL | Status: DC

## 2015-06-06 NOTE — Telephone Encounter (Signed)
Patient stated that she received Metformin 500mg  2 po bid in the mail from [facility] but patient takes Metformin ER 500mg  2 po bid. Patient stated that the regular Metformin does not work that she takes the ER. In the OV note the Metformin ER was discontinued. Patient wants the ER faxed to [facility] please Advise.

## 2015-06-06 NOTE — Telephone Encounter (Signed)
Patient called and wanted a refill called into [facility] for [place]. This is not in patient's medication list. Is this ok to refill and what directions? Please Advise.

## 2015-06-06 NOTE — Telephone Encounter (Signed)
She was given samples at last OV. dexilant 30mg  ##90 1 cap po daily with 1RF

## 2015-06-06 NOTE — Telephone Encounter (Signed)
Rx faxed to pharmacy and patient notified. Mediation list updated.

## 2015-06-06 NOTE — Telephone Encounter (Signed)
Ok to send er to [facility]

## 2015-06-06 NOTE — Telephone Encounter (Signed)
Medication faxed to [facility] and patient notified.

## 2015-06-29 ENCOUNTER — Other Ambulatory Visit: Payer: Commercial Managed Care - HMO

## 2015-06-29 DIAGNOSIS — E785 Hyperlipidemia, unspecified: Secondary | ICD-10-CM

## 2015-06-30 LAB — LIPID PANEL
Chol/HDL Ratio: 2.3 ratio units (ref 0.0–4.4)
Cholesterol, Total: 123 mg/dL (ref 100–199)
HDL: 54 mg/dL (ref >39)
LDL Calculated: 45 mg/dL (ref 0–99)
Triglycerides: 120 mg/dL (ref 0–149)
VLDL Cholesterol Cal: 24 mg/dL (ref 5–40)

## 2015-06-30 LAB — ALT: ALT: 12 IU/L (ref 0–32)

## 2015-07-01 ENCOUNTER — Encounter: Payer: Self-pay | Admitting: Internal Medicine

## 2015-07-01 ENCOUNTER — Ambulatory Visit (INDEPENDENT_AMBULATORY_CARE_PROVIDER_SITE_OTHER): Payer: Commercial Managed Care - HMO | Admitting: Internal Medicine

## 2015-07-01 VITALS — BP 116/66 | HR 98 | Temp 98.1°F | Resp 20 | Ht 66.0 in | Wt 163.4 lb

## 2015-07-01 DIAGNOSIS — Z1211 Encounter for screening for malignant neoplasm of colon: Secondary | ICD-10-CM | POA: Diagnosis not present

## 2015-07-01 DIAGNOSIS — E1142 Type 2 diabetes mellitus with diabetic polyneuropathy: Secondary | ICD-10-CM

## 2015-07-01 DIAGNOSIS — I10 Essential (primary) hypertension: Secondary | ICD-10-CM | POA: Diagnosis not present

## 2015-07-01 DIAGNOSIS — Z0000 Encounter for general adult medical examination without abnormal findings: Secondary | ICD-10-CM

## 2015-07-01 DIAGNOSIS — K219 Gastro-esophageal reflux disease without esophagitis: Secondary | ICD-10-CM

## 2015-07-01 DIAGNOSIS — E785 Hyperlipidemia, unspecified: Secondary | ICD-10-CM

## 2015-07-01 DIAGNOSIS — I491 Atrial premature depolarization: Secondary | ICD-10-CM | POA: Diagnosis not present

## 2015-07-01 NOTE — Patient Instructions (Addendum)
Encouraged her to exercise 30-45 minutes 4-5 times per week. Eat a well balanced diet. Avoid smoking. Limit alcohol intake. Wear seatbelt when riding in the car. Wear sun block (SPF >50) when spending extended times outside.  Continue current medications as ordered  Will call with colonoscopy referral  Follow up in 3 mos for routine visit. Fasting labs prior to appt

## 2015-07-01 NOTE — Progress Notes (Deleted)
Patient ID: Sierra Ray, female   DOB: [birth year], 70 y.o.   MRN: [id number]    Location:       Place of Service:     Chief Complaint  Patient presents with  . Annual Exam    Annual Exam, labs printed and more patient info in chart    HPI:  ***  Past Medical History  Diagnosis Date  . Diabetes mellitus type 2 with neurological manifestations ([place])   . Hypertension   . Hyperlipidemia   . Asthma   . Hx of seasonal allergies   . Arthritis   . GERD (gastroesophageal reflux disease)   . Abnormal Pap smear 05/08/2011    ASC-US +HPV  . S/P total hysterectomy 06/21/2011  . IBS (irritable bowel syndrome)   . Diabetic peripheral neuropathy ([place])   . Generalized osteoarthritis     Past Surgical History  Procedure Laterality Date  . Vaginal hysterectomy  1983  . Ovarian cyst surgery  1986  . Tonsilectomy, adenoidectomy, bilateral myringotomy and tubes  1974    Patient Care Team: Gildardo Cranker, DO as PCP - General (Internal Medicine)  Social History   Social History  . Marital Status: Divorced    Spouse Name: N/A  . Number of Children: 4  . Years of Education: N/A   Occupational History  .     Social History Main Topics  . Smoking status: Never Smoker   . Smokeless tobacco: Never Used  . Alcohol Use: No  . Drug Use: No  . Sexual Activity: Not Currently    Birth Control/ Protection: Surgical   Other Topics Concern  . Not on file   Social History Narrative   Diet?    Low fat, low carb, low salt      Do you drink/eat things with caffeine?      Marital status?  single                                  What year were you married?      Do you live in a house, apartment, assisted living, condo, trailer, etc.?      Is it one or more stories?      How many persons live in your home?      Do you have any pets in your home? (please list)      Current or past profession:      Do you exercise?   yes                 Type & how often? Yoga, water aerobics  1-2x/week      Do you have a living will?      Do you have a DNR form?                                  If not, do you want to discuss one?      Do you have signed POA/HPOA for forms?            reports that she has never smoked. She has never used smokeless tobacco. She reports that she does not drink alcohol or use illicit drugs.  Allergies  Allergen Reactions  . Aspirin   . Pravastatin Sodium Other (See Comments)    Muscle aches    Medications: Patient's Medications  New Prescriptions  No medications on file  Previous Medications   ACCU-CHEK SOFTCLIX LANCETS LANCETS    Use one lancet daily to test blood sugar for Diabetes  E11.42   ALCOHOL SWABS (B-D SINGLE USE SWABS REGULAR) PADS    As directed for diabetes one daily to test blood E11.42   AMLODIPINE (NORVASC) 5 MG TABLET    Take 1 tablet (5 mg total) by mouth daily.   CHOLECALCIFEROL (VITAMIN D) 1000 UNITS TABLET    Take 1,000 Units by mouth daily.    DEXLANSOPRAZOLE (DEXILANT) 30 MG CAPSULE    Take one capsule by mouth once daily   DICLOFENAC (VOLTAREN) 75 MG EC TABLET    Take 1 tablet (75 mg total) by mouth daily as needed.   FLUTICASONE (FLONASE) 50 MCG/ACT NASAL SPRAY    Place 2 sprays into the nose daily as needed.   GABAPENTIN (NEURONTIN) 400 MG CAPSULE    Take 1 capsule (400 mg total) by mouth 2 (two) times daily.   GLUCOSE BLOOD (ACCU-CHEK AVIVA PLUS) TEST STRIP    Use one strip to test blood sugar once a day for  Diabetes E11.42   LORATADINE (CLARITIN) 10 MG TABLET    Take 10 mg by mouth daily.   METFORMIN (GLUCOPHAGE-XR) 500 MG 24 HR TABLET    Take two tablets by mouth twice daily to control blood sugar   METOPROLOL TARTRATE (LOPRESSOR) 25 MG TABLET    Take 0.5 tablets (12.5 mg total) by mouth 2 (two) times daily.   MULTIPLE VITAMINS-MINERALS (ICAPS AREDS FORMULA PO)    Take by mouth daily.   MULTIPLE VITAMINS-MINERALS (MULTIVITAMIN WITH MINERALS) TABLET    Take 1 tablet by mouth daily.   OMEGA-3 FATTY ACIDS  (FISH OIL) 1000 MG CAPS    Take 2 capsules by mouth daily.   OVER THE COUNTER MEDICATION    1 drop as needed. For dry eyes. Equate Brand Restore Tears Lubricant Eye Drops   POLYETHYLENE GLYCOL (MIRALAX / GLYCOLAX) PACKET    Take 17 g by mouth daily as needed. For constipation   PROBIOTIC PRODUCT (PROBIOTIC DAILY PO)    Take by mouth.   ROSUVASTATIN (CRESTOR) 5 MG TABLET    Take one tablet daily for cholesterol   SUCRALFATE (CARAFATE) 1 G TABLET    Take 1 tablet by mouth four times daily before meals and at bedtime  Modified Medications   No medications on file  Discontinued Medications   No medications on file    Review of Systems  Filed Vitals:   07/01/15 1455  BP: 116/66  Pulse: 98  Temp: 98.1 F (36.7 C)  TempSrc: Oral  Resp: 20  Height: 5\' 6"  (1.676 m)  Weight: 163 lb 6.4 oz (74.118 kg)  SpO2: 98%   Body mass index is 26.39 kg/(m^2).  Physical Exam   Labs reviewed: Appointment on 06/29/2015  Component Date Value Ref Range Status  . Cholesterol, Total 06/29/2015 123  100 - 199 mg/dL Final  . Triglycerides 06/29/2015 120  0 - 149 mg/dL Final  . HDL 06/29/2015 54  >39 mg/dL Final  . VLDL Cholesterol Cal 06/29/2015 24  5 - 40 mg/dL Final  . LDL Calculated 06/29/2015 45  0 - 99 mg/dL Final  . Chol/HDL Ratio 06/29/2015 2.3  0.0 - 4.4 ratio units Final   Comment:  T. Chol/HDL Ratio                                             Men  Women                               1/2 Avg.Risk  3.4    3.3                                   Avg.Risk  5.0    4.4                                2X Avg.Risk  9.6    7.1                                3X Avg.Risk 23.4   11.0   . ALT 06/29/2015 12  0 - 32 IU/L Final  Abstract on 06/16/2015  Component Date Value Ref Range Status  . HM Dexa Scan 02/02/2015 [facility]- Osteopenia   Final  . Glucose 01/31/2015 106   Final  . Hemoglobin A1C 01/31/2015 6.6   Final  Abstract on 06/16/2015  Component Date Value Ref Range  Status  . HM Dexa Scan 09/30/2009 [facility]   Final  Abstract on 06/03/2015  Component Date Value Ref Range Status  . Glucose 01/31/2015 106   Final  . BUN 01/31/2015 20  4 - 21 mg/dL Final  . Creatinine 01/31/2015 0.9  0.5 - 1.1 mg/dL Final  . Potassium 01/31/2015 4.2  3.4 - 5.3 mmol/L Final  . Sodium 01/31/2015 140  137 - 147 mmol/L Final  . Triglycerides 01/31/2015 221* 40 - 160 mg/dL Final  . Cholesterol 01/31/2015 247* 0 - 200 mg/dL Final  . Alkaline Phosphatase 01/31/2015 50  25 - 125 U/L Final  . ALT 01/31/2015 11  7 - 35 U/L Final  . AST 01/31/2015 14  13 - 35 U/L Final  . Bilirubin, Total 01/31/2015 0.5   Final  . Hemoglobin A1C 01/31/2015 6.6   Final  Office Visit on 05/27/2015  Component Date Value Ref Range Status  . Glucose 05/27/2015 95  65 - 99 mg/dL Final  . BUN 05/27/2015 16  8 - 27 mg/dL Final  . Creatinine, Ser 05/27/2015 0.89  0.57 - 1.00 mg/dL Final  . GFR calc non Af Amer 05/27/2015 67  >59 mL/min/1.73 Final  . GFR calc Af Amer 05/27/2015 77  >59 mL/min/1.73 Final  . BUN/Creatinine Ratio 05/27/2015 18  11 - 26 Final  . Sodium 05/27/2015 145* 134 - 144 mmol/L Final  . Potassium 05/27/2015 4.2  3.5 - 5.2 mmol/L Final  . Chloride 05/27/2015 104  96 - 106 mmol/L Final  . CO2 05/27/2015 25  18 - 29 mmol/L Final  . Calcium 05/27/2015 9.4  8.7 - 10.3 mg/dL Final  . Total Protein 05/27/2015 7.0  6.0 - 8.5 g/dL Final  . Albumin 05/27/2015 4.3  3.6 - 4.8 g/dL Final  . Globulin, Total 05/27/2015 2.7  1.5 - 4.5 g/dL Final  . Albumin/Globulin Ratio 05/27/2015 1.6  1.1 - 2.5 Final  . Bilirubin Total  05/27/2015 0.3  0.0 - 1.2 mg/dL Final  . Alkaline Phosphatase 05/27/2015 55  39 - 117 IU/L Final  . AST 05/27/2015 18  0 - 40 IU/L Final  . ALT 05/27/2015 12  0 - 32 IU/L Final  . Cholesterol, Total 05/27/2015 232* 100 - 199 mg/dL Final  . Triglycerides 05/27/2015 225* 0 - 149 mg/dL Final  . HDL 05/27/2015 53  >39 mg/dL Final  . VLDL Cholesterol Cal 05/27/2015 45* 5  - 40 mg/dL Final  . LDL Calculated 05/27/2015 134* 0 - 99 mg/dL Final  . Chol/HDL Ratio 05/27/2015 4.4  0.0 - 4.4 ratio units Final   Comment:                                   T. Chol/HDL Ratio                                             Men  Women                               1/2 Avg.Risk  3.4    3.3                                   Avg.Risk  5.0    4.4                                2X Avg.Risk  9.6    7.1                                3X Avg.Risk 23.4   11.0   . WBC 05/27/2015 5.5  3.4 - 10.8 x10E3/uL Final  . RBC 05/27/2015 4.83  3.77 - 5.28 x10E6/uL Final  . Hemoglobin 05/27/2015 13.7  11.1 - 15.9 g/dL Final  . Hematocrit 05/27/2015 40.2  34.0 - 46.6 % Final  . MCV 05/27/2015 83  79 - 97 fL Final  . MCH 05/27/2015 28.4  26.6 - 33.0 pg Final  . MCHC 05/27/2015 34.1  31.5 - 35.7 g/dL Final  . RDW 05/27/2015 14.2  12.3 - 15.4 % Final  . Platelets 05/27/2015 207  150 - 379 x10E3/uL Final  . Neutrophils 05/27/2015 40   Final  . Lymphs 05/27/2015 46   Final  . Monocytes 05/27/2015 9   Final  . Eos 05/27/2015 4   Final  . Basos 05/27/2015 1   Final  . Neutrophils Absolute 05/27/2015 2.2  1.4 - 7.0 x10E3/uL Final  . Lymphocytes Absolute 05/27/2015 2.5  0.7 - 3.1 x10E3/uL Final  . Monocytes Absolute 05/27/2015 0.5  0.1 - 0.9 x10E3/uL Final  . EOS (ABSOLUTE) 05/27/2015 0.2  0.0 - 0.4 x10E3/uL Final  . Basophils Absolute 05/27/2015 0.1  0.0 - 0.2 x10E3/uL Final  . Immature Granulocytes 05/27/2015 0   Final  . Immature Grans (Abs) 05/27/2015 0.0  0.0 - 0.1 x10E3/uL Final  . Hgb A1c MFr Bld 05/27/2015 6.6* 4.8 - 5.6 % Final   Comment:          Pre-diabetes: 5.7 -  6.4          Diabetes: >6.4          Glycemic control for adults with diabetes: <7.0   . Est. average glucose Bld gHb Est-m* 05/27/2015 143   Final  . Specific Gravity, UA 05/27/2015 1.020  1.005 - 1.030 Final  . pH, UA 05/27/2015 6.0  5.0 - 7.5 Final  . Color, UA 05/27/2015 Yellow  Yellow Final  . Appearance Ur  05/27/2015 Clear  Clear Final  . Leukocytes, UA 05/27/2015 Negative  Negative Final  . Protein, UA 05/27/2015 Negative  Negative/Trace Final  . Glucose, UA 05/27/2015 Negative  Negative Final  . Ketones, UA 05/27/2015 Negative  Negative Final  . RBC, UA 05/27/2015 Negative  Negative Final  . Bilirubin, UA 05/27/2015 Negative  Negative Final  . Urobilinogen, Ur 05/27/2015 0.2  0.2 - 1.0 mg/dL Final  . Nitrite, UA 05/27/2015 Negative  Negative Final  . Microscopic Examination 05/27/2015 Comment   Final   Microscopic not indicated and not performed.  . Creatinine, Urine 05/27/2015 187.5  Not Estab. mg/dL Final  . Microalbum.,U,Random 05/27/2015 12.0  Not Estab. ug/mL Final  . MICROALB/CREAT RATIO 05/27/2015 6.4  0.0 - 30.0 mg/g creat Final  . TSH 05/27/2015 1.260  0.450 - 4.500 uIU/mL Final    No results found.   Assessment/Plan   Aksh Swart S. Perlie Gold  [facility] [place], [place] [address] [phone] Cell (Monday-Friday 8 AM - 5 PM) [phone] After 5 PM and follow prompts  This encounter was created in error - please disregard.

## 2015-07-03 DIAGNOSIS — E1142 Type 2 diabetes mellitus with diabetic polyneuropathy: Secondary | ICD-10-CM | POA: Insufficient documentation

## 2015-07-03 DIAGNOSIS — E785 Hyperlipidemia, unspecified: Secondary | ICD-10-CM | POA: Insufficient documentation

## 2015-07-03 DIAGNOSIS — K219 Gastro-esophageal reflux disease without esophagitis: Secondary | ICD-10-CM | POA: Insufficient documentation

## 2015-07-03 DIAGNOSIS — I10 Essential (primary) hypertension: Secondary | ICD-10-CM | POA: Insufficient documentation

## 2015-07-03 NOTE — Progress Notes (Signed)
Patient ID: Sierra Ray, female   DOB: Oct 20, 1945, 70 y.o.   MRN: [id number] Subjective:     Sierra Ray is a 70 y.o. female and is here for a comprehensive physical exam. The patient reports no problems.  GERD - bloating improved with dexilant. She was last seen by GI in 2011 and had EGD and colonoscopy by [name]. EGD nml but recommended PPI like dexilant. She has been unable to afford medicine and has been using samples and taking Rx carafate. Colonoscopy revealed multiple polyps and she was told to repeat in 3-5 yrs. She takes miralax prn. Needs samples of dexilant  She c/o short term memory issues x several yrs and has gotten worse over the last 3-5 yrs. She declines any meds  She feels depressed but does not want to take any meds. She been to counseling in the past. No SI/HI. She has anhedonia and increased fatigue  Palpitations - ECG revealed PACs at cardio office in 2015. Takes metoprolol  HTN - BP controlled on amlodipine  Chronic pain - thoracic, lower back and b/l LE. She has had EMG/NCS in the past. She has never seen a neurologist. Has seen rheum in the past but that provider ([name]) is no longer in the area. Takes diclofenac prn  DM - takes metformin. A1c 6.6%. She has neuropathy and takes gabapentin  Hyperlipidemia - stable on crestor. LDL 45. Lipitor, zocor, pravachol caused myalgias. She does have some leg cramps but tolerable  Asthma/seasonal allergy - seasonal wheezing. Takes flonase and claritin prn   Past Medical History  Diagnosis Date  . Diabetes mellitus type 2 with neurological manifestations ([place])   . Hypertension   . Hyperlipidemia   . Asthma   . Hx of seasonal allergies   . Arthritis   . GERD (gastroesophageal reflux disease)   . Abnormal Pap smear 05/08/2011    ASC-US +HPV  . S/P total hysterectomy 06/21/2011  . IBS (irritable bowel syndrome)   . Diabetic peripheral neuropathy ([place])   . Generalized osteoarthritis    Past Surgical  History  Procedure Laterality Date  . Vaginal hysterectomy  1983  . Ovarian cyst surgery  1986  . Tonsilectomy, adenoidectomy, bilateral myringotomy and tubes  1974   Family History  Problem Relation Age of Onset  . Diabetes Father   . Heart disease Father   . Diabetes Mother   . Pancreatic cancer Brother   . Pancreatic cancer Sister      Social History   Social History  . Marital Status: Divorced    Spouse Name: N/A  . Number of Children: 4  . Years of Education: N/A   Occupational History  .     Social History Main Topics  . Smoking status: Never Smoker   . Smokeless tobacco: Never Used  . Alcohol Use: No  . Drug Use: No  . Sexual Activity: Not Currently    Birth Control/ Protection: Surgical   Other Topics Concern  . Not on file   Social History Narrative   Diet?    Low fat, low carb, low salt      Do you drink/eat things with caffeine?      Marital status?  single                                  What year were you married?      Do you live in  a house, apartment, assisted living, condo, trailer, etc.?      Is it one or more stories?      How many persons live in your home?      Do you have any pets in your home? (please list)      Current or past profession:      Do you exercise?   yes                 Type & how often? Yoga, water aerobics 1-2x/week      Do you have a living will?      Do you have a DNR form?                                  If not, do you want to discuss one?      Do you have signed POA/HPOA for forms?          Health Maintenance  Topic Date Due  . Hepatitis C Screening  June 17, 1945  . FOOT EXAM  04/23/1957  . OPHTHALMOLOGY EXAM  04/23/1957  . TETANUS/TDAP  04/23/1966  . COLONOSCOPY  04/23/1997  . ZOSTAVAX  04/24/2007  . PNA vac Low Risk Adult (1 of 2 - PCV13) 04/23/2012  . INFLUENZA VACCINE  11/15/2014  . HEMOGLOBIN A1C  11/24/2015  . URINE MICROALBUMIN  05/26/2016  . MAMMOGRAM  08/09/2016  . DEXA SCAN  Completed    Current Outpatient Prescriptions on File Prior to Visit  Medication Sig Dispense Refill  . ACCU-CHEK SOFTCLIX LANCETS lancets Use one lancet daily to test blood sugar for Diabetes  E11.42 100 each 3  . Alcohol Swabs (B-D SINGLE USE SWABS REGULAR) PADS As directed for diabetes one daily to test blood E11.42 100 each 3  . amLODipine (NORVASC) 5 MG tablet Take 1 tablet (5 mg total) by mouth daily. 90 tablet 3  . cholecalciferol (VITAMIN D) 1000 UNITS tablet Take 1,000 Units by mouth daily.     . diclofenac (VOLTAREN) 75 MG EC tablet Take 1 tablet (75 mg total) by mouth daily as needed. 90 tablet 3  . fluticasone (FLONASE) 50 MCG/ACT nasal spray Place 2 sprays into the nose daily as needed.    . gabapentin (NEURONTIN) 400 MG capsule Take 1 capsule (400 mg total) by mouth 2 (two) times daily. 180 capsule 3  . glucose blood (ACCU-CHEK AVIVA PLUS) test strip Use one strip to test blood sugar once a day for  Diabetes E11.42 100 each 3  . loratadine (CLARITIN) 10 MG tablet Take 10 mg by mouth daily.    . metFORMIN (GLUCOPHAGE-XR) 500 MG 24 hr tablet Take two tablets by mouth twice daily to control blood sugar 360 tablet 3  . metoprolol tartrate (LOPRESSOR) 25 MG tablet Take 0.5 tablets (12.5 mg total) by mouth 2 (two) times daily. 180 tablet 3  . Multiple Vitamins-Minerals (ICAPS AREDS FORMULA PO) Take by mouth daily.    . Multiple Vitamins-Minerals (MULTIVITAMIN WITH MINERALS) tablet Take 1 tablet by mouth daily.    . Omega-3 Fatty Acids (FISH OIL) 1000 MG CAPS Take 2 capsules by mouth daily.    Marland Kitchen OVER THE COUNTER MEDICATION 1 drop as needed. For dry eyes. Equate Brand Restore Tears Lubricant Eye Drops    . polyethylene glycol (MIRALAX / GLYCOLAX) packet Take 17 g by mouth daily as needed. For constipation    . Probiotic Product (PROBIOTIC DAILY PO) Take  by mouth.    . rosuvastatin (CRESTOR) 5 MG tablet Take one tablet daily for cholesterol 30 tablet 3  . sucralfate (CARAFATE) 1 g tablet Take 1  tablet by mouth four times daily before meals and at bedtime    . Dexlansoprazole (DEXILANT) 30 MG capsule Take one capsule by mouth once daily (Patient not taking: Reported on 07/01/2015) 90 capsule 1   No current facility-administered medications on file prior to visit.     Review of Systems   Review of Systems  Constitutional: Negative for fever, chills and malaise/fatigue.  HENT: Negative for sore throat and tinnitus.   Eyes: Negative for blurred vision and double vision.  Respiratory: Negative for cough, shortness of breath and wheezing.   Cardiovascular: Negative for chest pain, palpitations, orthopnea and leg swelling.  Gastrointestinal: Negative for heartburn, nausea, vomiting, abdominal pain, diarrhea, constipation and blood in stool.  Genitourinary: Negative for dysuria, urgency, frequency and hematuria.  Musculoskeletal: Negative for myalgias, joint pain and falls.  Skin: Negative for rash.  Neurological: Negative for dizziness, tingling, tremors, sensory change, focal weakness, seizures, loss of consciousness, weakness and headaches.  Endo/Heme/Allergies: Negative for environmental allergies. Does not bruise/bleed easily.  Psychiatric/Behavioral: Negative for depression and memory loss. The patient is not nervous/anxious and does not have insomnia.      Objective:   Filed Vitals:   07/01/15 1455  BP: 116/66  Pulse: 98  Temp: 98.1 F (36.7 C)  TempSrc: Oral  Resp: 20  Height: 5\' 6"  (1.676 m)  Weight: 163 lb 6.4 oz (74.118 kg)  SpO2: 98%       Physical Exam  Constitutional: She is oriented to person, place, and time and well-developed, well-nourished, and in no distress.  HENT:  Head: Normocephalic and atraumatic.  Right Ear: External ear normal.  Left Ear: External ear normal.  Mouth/Throat: Oropharynx is clear and moist. No oropharyngeal exudate.  Increased cerumen right external ear canal but no impaction. Left TM nml  Eyes: Conjunctivae and EOM are normal.  Pupils are equal, round, and reactive to light. No scleral icterus.  Neck: Normal range of motion. Neck supple. Carotid bruit is not present. No tracheal deviation present. No thyromegaly present.  Cardiovascular: Normal rate, regular rhythm and intact distal pulses.  Exam reveals no gallop and no friction rub.   Murmur (1/6 SEM) heard. No LE edema b/l. No calf TTP. R>L leg varicosities with TTP on right at ankle  Pulmonary/Chest: Effort normal and breath sounds normal. She has no wheezes. She has no rhonchi. She has no rales. She exhibits no tenderness. Right breast exhibits no inverted nipple, no mass, no nipple discharge, no skin change and no tenderness. Left breast exhibits no inverted nipple, no mass, no nipple discharge, no skin change and no tenderness. Breasts are symmetrical.  Abdominal: Soft. Bowel sounds are normal. She exhibits distension. She exhibits no mass. There is no hepatosplenomegaly. There is no tenderness. There is no rebound and no guarding.  Genitourinary:  Deferred to GYN   Musculoskeletal: Normal range of motion. She exhibits edema.  Lymphadenopathy:    She has no cervical adenopathy.  Neurological: She is alert and oriented to person, place, and time. She has normal reflexes. Gait normal.  Skin: Skin is warm and dry. No rash noted.  Psychiatric: Mood, memory, affect and judgment normal.    Diabetic Foot Exam - Simple   Simple Foot Form  Diabetic Foot exam was performed with the following findings:  Yes 07/01/2015  5:51 PM  Visual  Inspection  No deformities, no ulcerations, no other skin breakdown bilaterally:  Yes  Sensation Testing  See comments:  Yes  Pulse Check  Posterior Tibialis and Dorsalis pulse intact bilaterally:  Yes  Comments  Monofilament testing reduced L>R toes     MMSE - Mini Mental State Exam 07/01/2015  Not completed: (No Data)  Orientation to time 5  Orientation to Place 5  Registration 3  Attention/ Calculation 5  Recall 3  Language-  name 2 objects 2  Language- repeat 1  Language- follow 3 step command 3  Language- read & follow direction 1  Write a sentence 1  Copy design 1  Total score 30    Recent Results (from the past 2160 hour(s))  CMP     Status: Abnormal   Collection Time: 05/27/15 10:00 AM  Result Value Ref Range   Glucose 95 65 - 99 mg/dL   BUN 16 8 - 27 mg/dL   Creatinine, Ser 0.89 0.57 - 1.00 mg/dL   GFR calc non Af Amer 67 >59 mL/min/1.73   GFR calc Af Amer 77 >59 mL/min/1.73   BUN/Creatinine Ratio 18 11 - 26   Sodium 145 (H) 134 - 144 mmol/L   Potassium 4.2 3.5 - 5.2 mmol/L   Chloride 104 96 - 106 mmol/L   CO2 25 18 - 29 mmol/L   Calcium 9.4 8.7 - 10.3 mg/dL   Total Protein 7.0 6.0 - 8.5 g/dL   Albumin 4.3 3.6 - 4.8 g/dL   Globulin, Total 2.7 1.5 - 4.5 g/dL   Albumin/Globulin Ratio 1.6 1.1 - 2.5   Bilirubin Total 0.3 0.0 - 1.2 mg/dL   Alkaline Phosphatase 55 39 - 117 IU/L   AST 18 0 - 40 IU/L   ALT 12 0 - 32 IU/L  Lipid Panel     Status: Abnormal   Collection Time: 05/27/15 10:00 AM  Result Value Ref Range   Cholesterol, Total 232 (H) 100 - 199 mg/dL   Triglycerides 225 (H) 0 - 149 mg/dL   HDL 53 >39 mg/dL   VLDL Cholesterol Cal 45 (H) 5 - 40 mg/dL   LDL Calculated 134 (H) 0 - 99 mg/dL   Chol/HDL Ratio 4.4 0.0 - 4.4 ratio units    Comment:                                   T. Chol/HDL Ratio                                             Men  Women                               1/2 Avg.Risk  3.4    3.3                                   Avg.Risk  5.0    4.4                                2X Avg.Risk  9.6    7.1  3X Avg.Risk 23.4   11.0   CBC with Differential     Status: None   Collection Time: 05/27/15 10:00 AM  Result Value Ref Range   WBC 5.5 3.4 - 10.8 x10E3/uL   RBC 4.83 3.77 - 5.28 x10E6/uL   Hemoglobin 13.7 11.1 - 15.9 g/dL   Hematocrit 40.2 34.0 - 46.6 %   MCV 83 79 - 97 fL   MCH 28.4 26.6 - 33.0 pg   MCHC 34.1 31.5 - 35.7 g/dL   RDW 14.2  12.3 - 15.4 %   Platelets 207 150 - 379 x10E3/uL   Neutrophils 40 %   Lymphs 46 %   Monocytes 9 %   Eos 4 %   Basos 1 %   Neutrophils Absolute 2.2 1.4 - 7.0 x10E3/uL   Lymphocytes Absolute 2.5 0.7 - 3.1 x10E3/uL   Monocytes Absolute 0.5 0.1 - 0.9 x10E3/uL   EOS (ABSOLUTE) 0.2 0.0 - 0.4 x10E3/uL   Basophils Absolute 0.1 0.0 - 0.2 x10E3/uL   Immature Granulocytes 0 %   Immature Grans (Abs) 0.0 0.0 - 0.1 x10E3/uL  Hemoglobin A1c     Status: Abnormal   Collection Time: 05/27/15 10:00 AM  Result Value Ref Range   Hgb A1c MFr Bld 6.6 (H) 4.8 - 5.6 %    Comment:          Pre-diabetes: 5.7 - 6.4          Diabetes: >6.4          Glycemic control for adults with diabetes: <7.0    Est. average glucose Bld gHb Est-mCnc 143 mg/dL  Urinalysis with Reflex Microscopic     Status: None   Collection Time: 05/27/15 10:00 AM  Result Value Ref Range   Specific Gravity, UA 1.020 1.005 - 1.030   pH, UA 6.0 5.0 - 7.5   Color, UA Yellow Yellow   Appearance Ur Clear Clear   Leukocytes, UA Negative Negative   Protein, UA Negative Negative/Trace   Glucose, UA Negative Negative   Ketones, UA Negative Negative   RBC, UA Negative Negative   Bilirubin, UA Negative Negative   Urobilinogen, Ur 0.2 0.2 - 1.0 mg/dL   Nitrite, UA Negative Negative   Microscopic Examination Comment     Comment: Microscopic not indicated and not performed.  Microalbumin/Creatinine Ratio, Urine     Status: None   Collection Time: 05/27/15 10:00 AM  Result Value Ref Range   Creatinine, Urine 187.5 Not Estab. mg/dL   Microalbum.,U,Random 12.0 Not Estab. ug/mL   MICROALB/CREAT RATIO 6.4 0.0 - 30.0 mg/g creat  TSH     Status: None   Collection Time: 05/27/15 10:00 AM  Result Value Ref Range   TSH 1.260 0.450 - 4.500 uIU/mL  Lipid Panel     Status: None   Collection Time: 06/29/15  9:31 AM  Result Value Ref Range   Cholesterol, Total 123 100 - 199 mg/dL   Triglycerides 120 0 - 149 mg/dL   HDL 54 >39 mg/dL   VLDL  Cholesterol Cal 24 5 - 40 mg/dL   LDL Calculated 45 0 - 99 mg/dL   Chol/HDL Ratio 2.3 0.0 - 4.4 ratio units    Comment:                                   T. Chol/HDL Ratio  Men  Women                               1/2 Avg.Risk  3.4    3.3                                   Avg.Risk  5.0    4.4                                2X Avg.Risk  9.6    7.1                                3X Avg.Risk 23.4   11.0   ALT     Status: None   Collection Time: 06/29/15  9:31 AM  Result Value Ref Range   ALT 12 0 - 32 IU/L   ECG OBTAINED AND REVIEWED BY MYSELF: NSR @ 72 bpm, nml axis, LAE, slight IVCD, poor R wave progression. No acute ischemic changes. No other ECG available to compare  Assessment:    Healthy female exam.       ICD-9-CM ICD-10-CM   1. Well adult exam V70.0 Z00.00   2. Essential hypertension 401.9 I10 EKG 12-Lead  3. Hyperlipidemia 272.4 E78.5 EKG 12-Lead  4. Type 2 diabetes mellitus with diabetic polyneuropathy, without long-term current use of insulin (HCC) 250.60 E11.42    357.2    5. Gastroesophageal reflux disease without esophagitis 530.81 K21.9   6. Hyperlipidemia LDL goal <100 272.4 E78.5   7. PAC (premature atrial contraction) 427.61 I49.1   8. Benign essential HTN 401.1 I10   9. Colon cancer screening V76.51 Z12.11 Ambulatory referral to Gastroenterology    Plan:     See After Visit Summary for Counseling Recommendations    Pt is UTD on health maintenance. Vaccinations are UTD. Pt maintains a healthy lifestyle. Encouraged pt to exercise 30-45 minutes 4-5 times per week. Eat a well balanced diet. Avoid smoking. Limit alcohol intake. Wear seatbelt when riding in the car. Wear sun block (SPF >50) when spending extended times outside.  Continue current medications as ordered. Samples of dexilant 60mg  given to take 1 cap daily prn  Will call with colonoscopy referral  Follow up in 3 mos for routine visit. Fasting labs prior to  appt  Harahan S. Perlie Gold  [facility] [address] [place], [place] [address] [phone] Cell (Monday-Friday 8 AM - 5 PM) [phone] After 5 PM and follow prompts

## 2015-08-24 DIAGNOSIS — R1013 Epigastric pain: Secondary | ICD-10-CM | POA: Diagnosis not present

## 2015-08-24 DIAGNOSIS — Z86010 Personal history of colonic polyps: Secondary | ICD-10-CM | POA: Diagnosis not present

## 2015-08-24 DIAGNOSIS — K219 Gastro-esophageal reflux disease without esophagitis: Secondary | ICD-10-CM | POA: Diagnosis not present

## 2015-08-25 ENCOUNTER — Ambulatory Visit (INDEPENDENT_AMBULATORY_CARE_PROVIDER_SITE_OTHER): Payer: Commercial Managed Care - HMO | Admitting: Nurse Practitioner

## 2015-08-25 ENCOUNTER — Encounter: Payer: Self-pay | Admitting: Nurse Practitioner

## 2015-08-25 VITALS — BP 122/72 | HR 73 | Temp 98.3°F | Ht 66.0 in | Wt 163.2 lb

## 2015-08-25 DIAGNOSIS — E1142 Type 2 diabetes mellitus with diabetic polyneuropathy: Secondary | ICD-10-CM | POA: Diagnosis not present

## 2015-08-25 DIAGNOSIS — J069 Acute upper respiratory infection, unspecified: Secondary | ICD-10-CM | POA: Diagnosis not present

## 2015-08-25 DIAGNOSIS — I10 Essential (primary) hypertension: Secondary | ICD-10-CM

## 2015-08-25 MED ORDER — AMOXICILLIN-POT CLAVULANATE 875-125 MG PO TABS: 1.0000 | ORAL_TABLET | Freq: Two times a day (BID) | ORAL | Status: DC

## 2015-08-25 NOTE — Progress Notes (Signed)
Patient ID: Sierra Ray, female   DOB: [birth year], 70 y.o.   MRN: [id number]   Location:   [facility]   Place of Service:   clinic  Provider: Marlana Latus NP  Code Status: DNR Goals of Care:  Advanced Directives 08/25/2015  Does patient have an advance directive? Yes  Type of Advance Directive [place]  Does patient want to make changes to advanced directive? No - Patient declined  Copy of advanced directive(s) in chart? Yes     Chief Complaint  Patient presents with  . Cough    dry cough and sore throat    HPI: Patient is a 70 y.o. female seen today for an acute visit for cough, sore throat x 3 days, denied chest pain, phlegm production, no chills or fever, denied nausea or vomiting. No O2 desaturation  Past Medical History  Diagnosis Date  . Diabetes mellitus type 2 with neurological manifestations ([place])   . Hypertension   . Hyperlipidemia   . Asthma   . Hx of seasonal allergies   . Arthritis   . GERD (gastroesophageal reflux disease)   . Abnormal Pap smear 05/08/2011    ASC-US +HPV  . S/P total hysterectomy 06/21/2011  . IBS (irritable bowel syndrome)   . Diabetic peripheral neuropathy ([place])   . Generalized osteoarthritis     Past Surgical History  Procedure Laterality Date  . Vaginal hysterectomy  1983  . Ovarian cyst surgery  1986  . Tonsilectomy, adenoidectomy, bilateral myringotomy and tubes  1974    Allergies  Allergen Reactions  . Aspirin   . Pravastatin Sodium Other (See Comments)    Muscle aches      Medication List       This list is accurate as of: 08/25/15 12:30 PM.  Always use your most recent med list.               ACCU-CHEK SOFTCLIX LANCETS lancets  Use one lancet daily to test blood sugar for Diabetes  E11.42     amLODipine 5 MG tablet  Commonly known as:  NORVASC  Take 1 tablet (5 mg total) by mouth daily.     amoxicillin-clavulanate 875-125 MG tablet  Commonly known as:  AUGMENTIN  Take 1 tablet by  mouth 2 (two) times daily.     B-D SINGLE USE SWABS REGULAR Pads  As directed for diabetes one daily to test blood E11.42     cholecalciferol 1000 units tablet  Commonly known as:  VITAMIN D  Take 1,000 Units by mouth daily.     Dexlansoprazole 30 MG capsule  Commonly known as:  DEXILANT  Take one capsule by mouth once daily     diclofenac 75 MG EC tablet  Commonly known as:  VOLTAREN  Take 1 tablet (75 mg total) by mouth daily as needed.     Fish Oil 1000 MG Caps  Take 2 capsules by mouth daily.     fluticasone 50 MCG/ACT nasal spray  Commonly known as:  FLONASE  Place 2 sprays into the nose daily as needed.     gabapentin 400 MG capsule  Commonly known as:  NEURONTIN  Take 1 capsule (400 mg total) by mouth 2 (two) times daily.     glucose blood test strip  Commonly known as:  ACCU-CHEK AVIVA PLUS  Use one strip to test blood sugar once a day for  Diabetes E11.42     loratadine 10 MG tablet  Commonly known as:  CLARITIN  Take 10 mg by mouth daily.     metFORMIN 500 MG 24 hr tablet  Commonly known as:  GLUCOPHAGE-XR  Take two tablets by mouth twice daily to control blood sugar     metoprolol tartrate 25 MG tablet  Commonly known as:  LOPRESSOR  Take 0.5 tablets (12.5 mg total) by mouth 2 (two) times daily.     ICAPS AREDS FORMULA PO  Take by mouth daily.     multivitamin with minerals tablet  Take 1 tablet by mouth daily.     OVER THE COUNTER MEDICATION  1 drop as needed. For dry eyes. Equate Brand Restore Tears Lubricant Eye Drops     polyethylene glycol packet  Commonly known as:  MIRALAX / GLYCOLAX  Take 17 g by mouth daily as needed. For constipation     PROBIOTIC DAILY PO  Take by mouth.     rosuvastatin 5 MG tablet  Commonly known as:  CRESTOR  Take one tablet daily for cholesterol        Review of Systems:  Review of Systems  Constitutional: Negative for fever and chills.  HENT: Positive for congestion. Negative for sore throat and  tinnitus.        Sore throat  Respiratory: Positive for cough. Negative for shortness of breath and wheezing.   Cardiovascular: Negative for chest pain, palpitations and leg swelling.  Gastrointestinal: Negative for nausea, vomiting, abdominal pain, diarrhea, constipation and blood in stool.  Genitourinary: Negative for dysuria, urgency, frequency and hematuria.  Musculoskeletal: Negative for myalgias.  Skin: Negative for rash.  Allergic/Immunologic: Negative for environmental allergies.  Neurological: Negative for dizziness, tremors, seizures, weakness and headaches.  Hematological: Does not bruise/bleed easily.  Psychiatric/Behavioral: The patient is not nervous/anxious.     Health Maintenance  Topic Date Due  . Hepatitis C Screening  02/12/1946  . OPHTHALMOLOGY EXAM  04/23/1957  . TETANUS/TDAP  04/23/1966  . COLONOSCOPY  04/23/1997  . ZOSTAVAX  04/24/2007  . PNA vac Low Risk Adult (1 of 2 - PCV13) 04/23/2012  . INFLUENZA VACCINE  11/15/2015  . HEMOGLOBIN A1C  11/24/2015  . URINE MICROALBUMIN  05/26/2016  . FOOT EXAM  06/30/2016  . MAMMOGRAM  08/09/2016  . DEXA SCAN  Completed    Physical Exam: Filed Vitals:   08/25/15 1135  BP: 122/72  Pulse: 73  Temp: 98.3 F (36.8 C)  TempSrc: Oral  Height: 5\' 6"  (1.676 m)  Weight: 163 lb 3.2 oz (74.027 kg)  SpO2: 96%   Body mass index is 26.35 kg/(m^2). Physical Exam  Constitutional: She appears well-developed and well-nourished.  HENT:  Mouth/Throat: Oropharynx is clear and moist. No oropharyngeal exudate.  Mild erythema  Eyes: Pupils are equal, round, and reactive to light. No scleral icterus.  Neck: Neck supple. Carotid bruit is not present. No tracheal deviation present. No thyromegaly present.  Cardiovascular: Normal rate, regular rhythm, normal heart sounds and intact distal pulses.  Exam reveals no gallop and no friction rub.   No murmur heard. No LE edema b/l. no calf TTP. Medial ankle varicose veins, TTP    Pulmonary/Chest: Effort normal and breath sounds normal. No stridor. No respiratory distress. She has no wheezes. She has no rales.  Abdominal: Soft. Bowel sounds are normal. She exhibits distension. She exhibits no mass. There is no hepatomegaly. There is tenderness (epigastric). There is no rebound and no guarding.  Musculoskeletal: She exhibits edema and tenderness.       Lumbar back: She exhibits decreased range  of motion, tenderness and spasm.       Back:  Lymphadenopathy:    She has no cervical adenopathy.  Neurological: She is alert.  Skin: Skin is warm and dry. No rash noted.  Psychiatric: She has a normal mood and affect. Her behavior is normal.    Labs reviewed: Basic Metabolic Panel:  Recent Labs  01/31/15 05/27/15 1000  NA 140 145*  K 4.2 4.2  CL  --  104  CO2  --  25  GLUCOSE  --  95  BUN 20 16  CREATININE 0.9 0.89  CALCIUM  --  9.4  TSH  --  1.260   Liver Function Tests:  Recent Labs  01/31/15 05/27/15 1000 06/29/15 0931  AST 14 18  --   ALT 11 12 12   ALKPHOS 50 55  --   BILITOT  --  0.3  --   PROT  --  7.0  --   ALBUMIN  --  4.3  --    No results for input(s): LIPASE, AMYLASE in the last 8760 hours. No results for input(s): AMMONIA in the last 8760 hours. CBC:  Recent Labs  05/27/15 1000  WBC 5.5  NEUTROABS 2.2  HCT 40.2  MCV 83  PLT 207   Lipid Panel:  Recent Labs  01/31/15 05/27/15 1000 06/29/15 0931  CHOL 247* 232* 123  HDL  --  53 54  LDLCALC  --  134* 45  TRIG 221* 225* 120  CHOLHDL  --  4.4 2.3   Lab Results  Component Value Date   HGBA1C 6.6* 05/27/2015    Procedures since last visit: No results found.  Assessment/Plan Acute upper respiratory infection Cough, sore throat 3 days, denied fever, chills, or phlegm production, Augmentin 875 bid, OTC claritin, Tussin available as needed.   Benign essential HTN Controlled, continue Amlodipine 5mg , Metoprolol 12.5mg  bid.   Type 2 diabetes mellitus with diabetic  polyneuropathy, without long-term current use of insulin (HCC) Continue Metformin 500mg  bid. Gabapentin 400mg  bid.      Labs/tests ordered:  @ORDERS @ Next appt:  09/26/2015

## 2015-08-25 NOTE — Assessment & Plan Note (Addendum)
Continue Metformin 500mg  bid. Gabapentin 400mg  bid.

## 2015-08-25 NOTE — Assessment & Plan Note (Signed)
Controlled, continue Amlodipine 5mg , Metoprolol 12.5mg  bid.

## 2015-08-25 NOTE — Assessment & Plan Note (Signed)
Cough, sore throat 3 days, denied fever, chills, or phlegm production, Augmentin 875 bid, OTC claritin, Tussin available as needed.

## 2015-09-06 DIAGNOSIS — R1013 Epigastric pain: Secondary | ICD-10-CM | POA: Diagnosis not present

## 2015-09-06 DIAGNOSIS — D124 Benign neoplasm of descending colon: Secondary | ICD-10-CM | POA: Diagnosis not present

## 2015-09-06 DIAGNOSIS — K219 Gastro-esophageal reflux disease without esophagitis: Secondary | ICD-10-CM | POA: Diagnosis not present

## 2015-09-06 DIAGNOSIS — K2970 Gastritis, unspecified, without bleeding: Secondary | ICD-10-CM | POA: Diagnosis not present

## 2015-09-06 DIAGNOSIS — K2990 Gastroduodenitis, unspecified, without bleeding: Secondary | ICD-10-CM | POA: Diagnosis not present

## 2015-09-06 DIAGNOSIS — K2950 Unspecified chronic gastritis without bleeding: Secondary | ICD-10-CM | POA: Diagnosis not present

## 2015-09-06 DIAGNOSIS — Z86010 Personal history of colonic polyps: Secondary | ICD-10-CM | POA: Diagnosis not present

## 2015-09-06 DIAGNOSIS — D123 Benign neoplasm of transverse colon: Secondary | ICD-10-CM | POA: Diagnosis not present

## 2015-09-06 DIAGNOSIS — K5730 Diverticulosis of large intestine without perforation or abscess without bleeding: Secondary | ICD-10-CM | POA: Diagnosis not present

## 2015-09-06 DIAGNOSIS — K635 Polyp of colon: Secondary | ICD-10-CM | POA: Diagnosis not present

## 2015-09-06 DIAGNOSIS — Z1211 Encounter for screening for malignant neoplasm of colon: Secondary | ICD-10-CM | POA: Diagnosis not present

## 2015-09-26 ENCOUNTER — Other Ambulatory Visit: Payer: Commercial Managed Care - HMO

## 2015-09-26 DIAGNOSIS — E1142 Type 2 diabetes mellitus with diabetic polyneuropathy: Secondary | ICD-10-CM | POA: Diagnosis not present

## 2015-09-26 DIAGNOSIS — E785 Hyperlipidemia, unspecified: Secondary | ICD-10-CM | POA: Diagnosis not present

## 2015-09-26 DIAGNOSIS — I10 Essential (primary) hypertension: Secondary | ICD-10-CM

## 2015-09-27 LAB — HEMOGLOBIN A1C
Est. average glucose Bld gHb Est-mCnc: 140 mg/dL
Hgb A1c MFr Bld: 6.5 % — ABNORMAL HIGH (ref 4.8–5.6)

## 2015-09-27 LAB — BASIC METABOLIC PANEL
BUN/Creatinine Ratio: 23 (ref 12–28)
BUN: 20 mg/dL (ref 8–27)
CO2: 25 mmol/L (ref 18–29)
Calcium: 9.4 mg/dL (ref 8.7–10.3)
Chloride: 102 mmol/L (ref 96–106)
Creatinine, Ser: 0.86 mg/dL (ref 0.57–1.00)
GFR calc Af Amer: 80 mL/min/{1.73_m2} (ref >59)
GFR calc non Af Amer: 70 mL/min/{1.73_m2} (ref >59)
Glucose: 102 mg/dL — ABNORMAL HIGH (ref 65–99)
Potassium: 4.6 mmol/L (ref 3.5–5.2)
Sodium: 144 mmol/L (ref 134–144)

## 2015-09-27 LAB — ALT: ALT: 12 IU/L (ref 0–32)

## 2015-09-27 LAB — LIPID PANEL
Chol/HDL Ratio: 2.4 ratio (ref 0.0–4.4)
Cholesterol, Total: 139 mg/dL (ref 100–199)
HDL: 57 mg/dL
LDL Calculated: 55 mg/dL (ref 0–99)
Triglycerides: 134 mg/dL (ref 0–149)
VLDL Cholesterol Cal: 27 mg/dL (ref 5–40)

## 2015-09-28 ENCOUNTER — Encounter: Payer: Self-pay | Admitting: Internal Medicine

## 2015-09-28 ENCOUNTER — Ambulatory Visit (INDEPENDENT_AMBULATORY_CARE_PROVIDER_SITE_OTHER): Payer: Commercial Managed Care - HMO | Admitting: Internal Medicine

## 2015-09-28 VITALS — BP 118/76 | HR 73 | Temp 98.2°F | Ht 66.0 in | Wt 160.0 lb

## 2015-09-28 DIAGNOSIS — M2550 Pain in unspecified joint: Secondary | ICD-10-CM

## 2015-09-28 DIAGNOSIS — K5909 Other constipation: Secondary | ICD-10-CM

## 2015-09-28 DIAGNOSIS — K219 Gastro-esophageal reflux disease without esophagitis: Secondary | ICD-10-CM | POA: Diagnosis not present

## 2015-09-28 DIAGNOSIS — I10 Essential (primary) hypertension: Secondary | ICD-10-CM

## 2015-09-28 DIAGNOSIS — E1142 Type 2 diabetes mellitus with diabetic polyneuropathy: Secondary | ICD-10-CM

## 2015-09-28 DIAGNOSIS — Z124 Encounter for screening for malignant neoplasm of cervix: Secondary | ICD-10-CM

## 2015-09-28 DIAGNOSIS — E785 Hyperlipidemia, unspecified: Secondary | ICD-10-CM

## 2015-09-28 DIAGNOSIS — K5900 Constipation, unspecified: Secondary | ICD-10-CM | POA: Diagnosis not present

## 2015-09-28 NOTE — Progress Notes (Signed)
Patient ID: Sierra Liston V., female   DOB: [birth year], 71 y.o.   MRN: [id number]    Location:  [facility] Place of Service: OFFICE  Chief Complaint  Patient presents with  . Medical Management of Chronic Issues    3 month follow-up, discuss labs (copy printed)     HPI:  70 yo female seen today for f/u. She was treated for URI last month with Augmentin. She still has congestion in throat. She forgets to take claritin but has been using flonase.  GERD - bloating/burning improved with dexilant. She saw GI last month and had  EGD and colonoscopy by [name]. EGD showed gastritis and Rx omeprazole which has not been effective in the past but dexilant expensive. She has been unable to afford medicine and has been using samples. She is not taking carafate due to cost and unknown reaction. Colonoscopy revealed multiple benign polyps and diverticulosis and she was told to repeat in 5 yrs. She takes miralax prn. Needs samples of dexilant  She c/o short term memory issues x several yrs and has gotten worse over the last 3-5 yrs. She declines any meds  She feels depressed but does not want to take any meds. She been to counseling in the past. No SI/HI. She has anhedonia and increased fatigue  Palpitations - ECG revealed PACs at cardio office in 2015. Takes metoprolol  HTN - BP controlled on amlodipine  Chronic pain - thoracic, lower back and b/l LE. She has had EMG/NCS in the past. She has never seen a neurologist. Has seen rheum in the past but that provider ([name]) is no longer in the area. Takes diclofenac prn hut has not used it lately. Pain in her knees very severe. She does take aleve  DM - takes metformin. A1c 6.5%. She has neuropathy and takes gabapentin. Numbness in feet affects ability to ambulate safely.  Hyperlipidemia - stable on crestor. LDL 55. Lipitor, zocor, pravachol caused myalgias. She does have some leg cramps but tolerable  Asthma/seasonal allergy - seasonal wheezing. Takes  flonase and claritin prn  She needs referral to GYN (has appt next week) and eye specialist [name] (has appt in [place])  Past Medical History  Diagnosis Date  . Diabetes mellitus type 2 with neurological manifestations ([place])   . Hypertension   . Hyperlipidemia   . Asthma   . Hx of seasonal allergies   . Arthritis   . GERD (gastroesophageal reflux disease)   . Abnormal Pap smear 05/08/2011    ASC-US +HPV  . S/P total hysterectomy 06/21/2011  . IBS (irritable bowel syndrome)   . Diabetic peripheral neuropathy ([place])   . Generalized osteoarthritis     Past Surgical History  Procedure Laterality Date  . Vaginal hysterectomy  1983  . Ovarian cyst surgery  1986  . Tonsilectomy, adenoidectomy, bilateral myringotomy and tubes  1974    Patient Care Team: Gildardo Cranker, DO as PCP - General (Internal Medicine)  Social History   Social History  . Marital Status: Divorced    Spouse Name: N/A  . Number of Children: 4  . Years of Education: N/A   Occupational History  .     Social History Main Topics  . Smoking status: Never Smoker   . Smokeless tobacco: Never Used  . Alcohol Use: No  . Drug Use: No  . Sexual Activity: Not Currently    Birth Control/ Protection: Surgical   Other Topics Concern  . Not on file   Social  History Narrative   Diet?    Low fat, low carb, low salt      Do you drink/eat things with caffeine?      Marital status?  single                                  What year were you married?      Do you live in a house, apartment, assisted living, condo, trailer, etc.?      Is it one or more stories?      How many persons live in your home?      Do you have any pets in your home? (please list)      Current or past profession:      Do you exercise?   yes                 Type & how often? Yoga, water aerobics 1-2x/week      Do you have a living will?      Do you have a DNR form?                                  If not, do you want to  discuss one?      Do you have signed POA/HPOA for forms?            reports that she has never smoked. She has never used smokeless tobacco. She reports that she does not drink alcohol or use illicit drugs.  Family History  Problem Relation Age of Onset  . Diabetes Father   . Heart disease Father   . Diabetes Mother   . Pancreatic cancer Brother   . Pancreatic cancer Sister    Family Status  Relation Status Death Age  . Father Deceased   . Mother Deceased   . Brother Deceased   . Sister Deceased   . Son Alive   . Daughter Alive   . Daughter Alive   . Daughter Alive      Allergies  Allergen Reactions  . Aspirin   . Pravastatin Sodium Other (See Comments)    Muscle aches    Medications: Patient's Medications  New Prescriptions   No medications on file  Previous Medications   ACCU-CHEK SOFTCLIX LANCETS LANCETS    Use one lancet daily to test blood sugar for Diabetes  E11.42   ALCOHOL SWABS (B-D SINGLE USE SWABS REGULAR) PADS    As directed for diabetes one daily to test blood E11.42   AMLODIPINE (NORVASC) 5 MG TABLET    Take 1 tablet (5 mg total) by mouth daily.   CHOLECALCIFEROL (VITAMIN D) 1000 UNITS TABLET    Take 1,000 Units by mouth daily.    DEXLANSOPRAZOLE (DEXILANT) 30 MG CAPSULE    Take one capsule by mouth once daily   FLUTICASONE (FLONASE) 50 MCG/ACT NASAL SPRAY    Place 2 sprays into the nose daily as needed.   GABAPENTIN (NEURONTIN) 400 MG CAPSULE    Take 1 capsule (400 mg total) by mouth 2 (two) times daily.   GLUCOSE BLOOD (ACCU-CHEK AVIVA PLUS) TEST STRIP    Use one strip to test blood sugar once a day for  Diabetes E11.42   LORATADINE (CLARITIN) 10 MG TABLET    Take 10 mg by mouth daily.   METFORMIN (GLUCOPHAGE-XR) 500 MG 24 HR  TABLET    Take two tablets by mouth twice daily to control blood sugar   METOPROLOL TARTRATE (LOPRESSOR) 25 MG TABLET    Take 0.5 tablets (12.5 mg total) by mouth 2 (two) times daily.   MULTIPLE VITAMINS-MINERALS (ICAPS AREDS  FORMULA PO)    Take by mouth daily.   MULTIPLE VITAMINS-MINERALS (MULTIVITAMIN WITH MINERALS) TABLET    Take 1 tablet by mouth daily.   OMEGA-3 FATTY ACIDS (FISH OIL) 1000 MG CAPS    Take 2 capsules by mouth daily.   OMEPRAZOLE (PRILOSEC) 40 MG CAPSULE    Take 40 mg by mouth daily. Reported on 09/28/2015   OVER THE COUNTER MEDICATION    1 drop as needed. For dry eyes. Equate Brand Restore Tears Lubricant Eye Drops   POLYETHYLENE GLYCOL (MIRALAX / GLYCOLAX) PACKET    Take 17 g by mouth daily as needed. For constipation   PROBIOTIC PRODUCT (PROBIOTIC DAILY PO)    Take by mouth.   ROSUVASTATIN (CRESTOR) 5 MG TABLET    Take one tablet daily for cholesterol  Modified Medications   No medications on file  Discontinued Medications   AMOXICILLIN-CLAVULANATE (AUGMENTIN) 875-125 MG TABLET    Take 1 tablet by mouth 2 (two) times daily.   DICLOFENAC (VOLTAREN) 75 MG EC TABLET    Take 1 tablet (75 mg total) by mouth daily as needed.    Review of Systems  Unable to perform ROS: Other  memory loss  Filed Vitals:   09/28/15 0830  BP: 118/76  Pulse: 73  Temp: 98.2 F (36.8 C)  TempSrc: Oral  Height: 5\' 6"  (1.676 m)  Weight: 160 lb (72.576 kg)  SpO2: 98%   Body mass index is 25.84 kg/(m^2).  Physical Exam  Constitutional: She appears well-developed and well-nourished.  HENT:  TMs intact b/l. No redness. No cerumen in external ear canal. Oropharynx cobblestoning with min redness but no exudate  Eyes: Pupils are equal, round, and reactive to light. No scleral icterus.  Neck: Neck supple. Carotid bruit is not present. No tracheal deviation present. No thyromegaly present.  Cardiovascular: Normal rate, regular rhythm, normal heart sounds and intact distal pulses.  Exam reveals no gallop and no friction rub.   No murmur heard. No LE edema b/l. no calf TTP. Medial ankle varicose veins, TTP  Pulmonary/Chest: Effort normal and breath sounds normal. No stridor. No respiratory distress. She has no  wheezes. She has no rales.  Abdominal: Soft. Bowel sounds are normal. She exhibits distension. She exhibits no mass. There is no hepatomegaly. There is tenderness (epigastric). There is no rebound and no guarding.  Musculoskeletal: She exhibits edema and tenderness.       Lumbar back: She exhibits decreased range of motion, tenderness and spasm.       Back:  Lymphadenopathy:    She has no cervical adenopathy.  Neurological: She is alert.  Skin: Skin is warm and dry. No rash noted.  Psychiatric: She has a normal mood and affect. Her behavior is normal.     Labs reviewed: Lab on 09/26/2015  Component Date Value Ref Range Status  . Glucose 09/26/2015 102* 65 - 99 mg/dL Final  . BUN 09/26/2015 20  8 - 27 mg/dL Final  . Creatinine, Ser 09/26/2015 0.86  0.57 - 1.00 mg/dL Final  . GFR calc non Af Amer 09/26/2015 70  >59 mL/min/1.73 Final  . GFR calc Af Amer 09/26/2015 80  >59 mL/min/1.73 Final  . BUN/Creatinine Ratio 09/26/2015 23  12 - 28 Final  .  Sodium 09/26/2015 144  134 - 144 mmol/L Final  . Potassium 09/26/2015 4.6  3.5 - 5.2 mmol/L Final  . Chloride 09/26/2015 102  96 - 106 mmol/L Final  . CO2 09/26/2015 25  18 - 29 mmol/L Final  . Calcium 09/26/2015 9.4  8.7 - 10.3 mg/dL Final  . ALT 09/26/2015 12  0 - 32 IU/L Final  . Cholesterol, Total 09/26/2015 139  100 - 199 mg/dL Final  . Triglycerides 09/26/2015 134  0 - 149 mg/dL Final  . HDL 09/26/2015 57  >39 mg/dL Final  . VLDL Cholesterol Cal 09/26/2015 27  5 - 40 mg/dL Final  . LDL Calculated 09/26/2015 55  0 - 99 mg/dL Final  . Chol/HDL Ratio 09/26/2015 2.4  0.0 - 4.4 ratio units Final   Comment:                                   T. Chol/HDL Ratio                                             Men  Women                               1/2 Avg.Risk  3.4    3.3                                   Avg.Risk  5.0    4.4                                2X Avg.Risk  9.6    7.1                                3X Avg.Risk 23.4   11.0   . Hgb  A1c MFr Bld 09/26/2015 6.5* 4.8 - 5.6 % Final   Comment:          Pre-diabetes: 5.7 - 6.4          Diabetes: >6.4          Glycemic control for adults with diabetes: <7.0   . Est. average glucose Bld gHb Est-m* 09/26/2015 140   Final  Appointment on 06/29/2015  Component Date Value Ref Range Status  . Cholesterol, Total 06/29/2015 123  100 - 199 mg/dL Final  . Triglycerides 06/29/2015 120  0 - 149 mg/dL Final  . HDL 06/29/2015 54  >39 mg/dL Final  . VLDL Cholesterol Cal 06/29/2015 24  5 - 40 mg/dL Final  . LDL Calculated 06/29/2015 45  0 - 99 mg/dL Final  . Chol/HDL Ratio 06/29/2015 2.3  0.0 - 4.4 ratio units Final   Comment:                                   T. Chol/HDL Ratio  Men  Women                               1/2 Avg.Risk  3.4    3.3                                   Avg.Risk  5.0    4.4                                2X Avg.Risk  9.6    7.1                                3X Avg.Risk 23.4   11.0   . ALT 06/29/2015 12  0 - 32 IU/L Final    No results found.   Assessment/Plan   ICD-9-CM ICD-10-CM   1. Gastroesophageal reflux disease without esophagitis 530.81 K21.9   2. Type 2 diabetes mellitus with diabetic polyneuropathy, without long-term current use of insulin (HCC) 250.60 E11.42 Ambulatory referral to Ophthalmology   [id number]  Basic Metabolic Panel     ALT     Hemoglobin A1c  3. Essential hypertension 401.9 I10   4. Hyperlipidemia LDL goal <100 272.4 E78.5 Lipid Panel  5. Chronic constipation 564.00 K59.00   6. Pain in joint, multiple sites 719.49 M25.50   7. Cervical cancer screening V76.2 Z12.4 Ambulatory referral to Gynecology   Continue current medications as ordered  May use diclofenac prn pain.   Follow up with specialists as scheduled. Will call with referral appts  Follow up in 4 mos for routine visit. Fasting labs prior to appt  [place] S. Perlie Gold  [facility] [address] [place], [place] [address] [phone] Cell (Monday-Friday 8 AM - 5 PM) [phone] After 5 PM and follow prompts

## 2015-09-28 NOTE — Patient Instructions (Signed)
Continue current medications as ordered  Follow up with specialists as scheduled. Will call with referral appts  Follow up in 4 mos for routine visit. Fasting labs prior to appt

## 2015-09-30 ENCOUNTER — Telehealth: Payer: Self-pay

## 2015-09-30 NOTE — Telephone Encounter (Signed)
Spoke with patient, patient aware that GYN will not accept referral as placed. Patient states she will contact her GYN and follow-up with [place] if needed

## 2015-09-30 NOTE — Addendum Note (Signed)
Addended by: Logan Bores on: 09/30/2015 10:26 AM   Modules accepted: Orders

## 2015-09-30 NOTE — Telephone Encounter (Signed)
When Earlie Server (referral coordinator) contacted GYN office to set up a referral placed at last OV, the GYN office stated that the associated diagnosis (cervical cancer screening) is not covered for the patient had a hysterectomy. Order canceled for GYN   Left message on voicemail for patient to return call when available, reason for call was to see if patient is having any problems that would warrant a vaginal exam, otherwise no referral is needed at this time for GYN

## 2015-10-13 ENCOUNTER — Encounter: Payer: Self-pay | Admitting: Internal Medicine

## 2015-11-23 DIAGNOSIS — H04123 Dry eye syndrome of bilateral lacrimal glands: Secondary | ICD-10-CM | POA: Diagnosis not present

## 2015-11-23 DIAGNOSIS — E119 Type 2 diabetes mellitus without complications: Secondary | ICD-10-CM | POA: Diagnosis not present

## 2015-11-23 DIAGNOSIS — H40013 Open angle with borderline findings, low risk, bilateral: Secondary | ICD-10-CM | POA: Diagnosis not present

## 2015-11-23 DIAGNOSIS — H25812 Combined forms of age-related cataract, left eye: Secondary | ICD-10-CM | POA: Diagnosis not present

## 2015-11-23 DIAGNOSIS — H2511 Age-related nuclear cataract, right eye: Secondary | ICD-10-CM | POA: Diagnosis not present

## 2015-11-23 LAB — HM DIABETES EYE EXAM

## 2015-12-07 NOTE — Addendum Note (Signed)
Addended by: Rafael Bihari A on: 12/07/2015 12:06 PM   Modules accepted: Orders

## 2016-01-03 ENCOUNTER — Encounter: Payer: Self-pay | Admitting: Internal Medicine

## 2016-01-25 ENCOUNTER — Other Ambulatory Visit: Payer: Commercial Managed Care - HMO

## 2016-01-27 ENCOUNTER — Ambulatory Visit: Payer: Commercial Managed Care - HMO | Admitting: Internal Medicine

## 2016-02-01 ENCOUNTER — Other Ambulatory Visit: Payer: Commercial Managed Care - HMO

## 2016-02-01 DIAGNOSIS — E785 Hyperlipidemia, unspecified: Secondary | ICD-10-CM

## 2016-02-01 DIAGNOSIS — E1142 Type 2 diabetes mellitus with diabetic polyneuropathy: Secondary | ICD-10-CM

## 2016-02-02 LAB — BASIC METABOLIC PANEL
BUN: 15 mg/dL (ref 7–25)
CO2: 28 mmol/L (ref 20–31)
Calcium: 9 mg/dL (ref 8.6–10.4)
Chloride: 106 mmol/L (ref 98–110)
Creat: 0.83 mg/dL (ref 0.50–0.99)
Glucose, Bld: 108 mg/dL — ABNORMAL HIGH (ref 65–99)
Potassium: 4.3 mmol/L (ref 3.5–5.3)
Sodium: 143 mmol/L (ref 135–146)

## 2016-02-02 LAB — LIPID PANEL
Cholesterol: 168 mg/dL (ref 125–200)
HDL: 55 mg/dL (ref >=46)
LDL Cholesterol: 80 mg/dL (ref <130)
Total CHOL/HDL Ratio: 3.1 Ratio (ref <=5.0)
Triglycerides: 164 mg/dL — ABNORMAL HIGH (ref <150)
VLDL: 33 mg/dL — ABNORMAL HIGH (ref <30)

## 2016-02-02 LAB — HEMOGLOBIN A1C
Hgb A1c MFr Bld: 6.4 % — ABNORMAL HIGH (ref <5.7)
Mean Plasma Glucose: 137 mg/dL

## 2016-02-02 LAB — ALT: ALT: 11 U/L (ref 6–29)

## 2016-02-03 ENCOUNTER — Ambulatory Visit (INDEPENDENT_AMBULATORY_CARE_PROVIDER_SITE_OTHER): Payer: Commercial Managed Care - HMO | Admitting: Internal Medicine

## 2016-02-03 ENCOUNTER — Encounter: Payer: Self-pay | Admitting: Internal Medicine

## 2016-02-03 VITALS — BP 110/68 | HR 67 | Temp 98.3°F | Ht 66.0 in | Wt 158.8 lb

## 2016-02-03 DIAGNOSIS — R002 Palpitations: Secondary | ICD-10-CM

## 2016-02-03 DIAGNOSIS — M2550 Pain in unspecified joint: Secondary | ICD-10-CM | POA: Diagnosis not present

## 2016-02-03 DIAGNOSIS — R413 Other amnesia: Secondary | ICD-10-CM | POA: Diagnosis not present

## 2016-02-03 DIAGNOSIS — K219 Gastro-esophageal reflux disease without esophagitis: Secondary | ICD-10-CM | POA: Diagnosis not present

## 2016-02-03 DIAGNOSIS — E785 Hyperlipidemia, unspecified: Secondary | ICD-10-CM | POA: Diagnosis not present

## 2016-02-03 DIAGNOSIS — I10 Essential (primary) hypertension: Secondary | ICD-10-CM | POA: Diagnosis not present

## 2016-02-03 DIAGNOSIS — Z23 Encounter for immunization: Secondary | ICD-10-CM

## 2016-02-03 DIAGNOSIS — E1142 Type 2 diabetes mellitus with diabetic polyneuropathy: Secondary | ICD-10-CM

## 2016-02-03 DIAGNOSIS — J301 Allergic rhinitis due to pollen: Secondary | ICD-10-CM | POA: Diagnosis not present

## 2016-02-03 NOTE — Patient Instructions (Addendum)
Flu shot given today  Continue current medications as ordered  Follow up in 5 mos for CPE/ECG/MMSE. Fasting labs prior to appt

## 2016-02-03 NOTE — Progress Notes (Signed)
Patient ID: Sierra Batzel V., female   DOB: 07-Jul-1945, 70 y.o.   MRN: [id number]    Location:  [facility] Place of Service: OFFICE  Chief Complaint  Patient presents with  . Medical Management of Chronic Issues    4 months routine visit  . Medication Management    omeprazole does not help  . Flu Vaccine    would like to get the double strength     HPI:  70 yo female seen today for f/u. She continues to have burning sensation in abdomen each dose of omeprazole. She is unable to afford dexilant. She is a poor historian due to memory loss. Hx obtained from chart  GERD - bloating/burning improved with dexilant in the past. She saw GI last month and had EGD and colonoscopy by [name]. EGD showed gastritis and Rx omeprazole which has not been effective in the past but dexilant expensive. She has been unable to afford medicine and has been using samples. She is not taking carafate due to cost and unknown reaction. Colonoscopy revealed multiple benign polyps and diverticulosis and she was told to repeat in 5 yrs. She takes miralax prn. Needs samples of dexilant  She c/o short term memory issues x several yrs and has gotten worse over the last 3-5 yrs. She declines any meds  She feels depressed but does not want to take any meds. She been to counseling in the past. No SI/HI. She has anhedonia and increased fatigue  Palpitations - ECG revealed PACs at cardio office in 2015. Takes metoprolol  HTN - BP controlled on amlodipine  Chronic pain - thoracic, lower back and b/l LE. She has had EMG/NCS in the past. She has never seen a neurologist. Has seen rheum in the past but that provider ([name]) is no longer in the area. Takes diclofenac prn hut has not used it lately. Pain in her knees very severe. She does take aleve  DM - takes metformin. A1c 6.4%. She has neuropathy and takes gabapentin. Numbness in feet affects ability to ambulate safely.  Hyperlipidemia - stable on crestor. LDL 80. Lipitor,  zocor, pravachol caused myalgias. She does have some leg cramps but tolerable  Asthma/seasonal allergy - seasonal wheezing. Takes flonase. She stopped claritin due to c/a dryness of throat. She now has 3-4 day hx ear pressure and popping, post nasal drip.no f/c. No sore throat  She never followed up with GYN after her last OV  She saw eye specialist [name]. She cancelled her cataract sx due to fear of having it and no transportation  She is UTD on pneumonia vaccines. She refuses influenza vaccine  Past Medical History:  Diagnosis Date  . Abnormal Pap smear 05/08/2011   ASC-US +HPV  . Arthritis   . Asthma   . Diabetes mellitus type 2 with neurological manifestations ([place])   . Diabetic peripheral neuropathy ([place])   . Generalized osteoarthritis   . GERD (gastroesophageal reflux disease)   . Hx of seasonal allergies   . Hyperlipidemia   . Hypertension   . IBS (irritable bowel syndrome)   . S/P total hysterectomy 06/21/2011    Past Surgical History:  Procedure Laterality Date  . OVARIAN CYST SURGERY  1986  . TONSILECTOMY, ADENOIDECTOMY, BILATERAL MYRINGOTOMY AND TUBES  1974  . VAGINAL HYSTERECTOMY  1983    Patient Care Team: Gildardo Cranker, DO as PCP - General (Internal Medicine)  Social History   Social History  . Marital status: Single    Spouse name:  N/A  . Number of children: 4  . Years of education: N/A   Occupational History  .  Retired   Social History Main Topics  . Smoking status: Never Smoker  . Smokeless tobacco: Never Used  . Alcohol use No  . Drug use: No  . Sexual activity: Not Currently    Birth control/ protection: Surgical   Other Topics Concern  . Not on file   Social History Narrative   Diet?    Low fat, low carb, low salt      Do you drink/eat things with caffeine?      Marital status?  single                                  What year were you married?      Do you live in a house, apartment, assisted living, condo, trailer, etc.?       Is it one or more stories?      How many persons live in your home?      Do you have any pets in your home? (please list)      Current or past profession:      Do you exercise?   yes                 Type & how often? Yoga, water aerobics 1-2x/week      Do you have a living will?      Do you have a DNR form?                                  If not, do you want to discuss one?      Do you have signed POA/HPOA for forms?            reports that she has never smoked. She has never used smokeless tobacco. She reports that she does not drink alcohol or use drugs.  Family History  Problem Relation Age of Onset  . Diabetes Father   . Heart disease Father   . Diabetes Mother   . Pancreatic cancer Brother   . Pancreatic cancer Sister    Family Status  Relation Status  . Father Deceased  . Mother Deceased  . Brother Deceased  . Sister Deceased  . Son Alive  . Daughter Alive  . Daughter Alive  . Daughter Alive     Allergies  Allergen Reactions  . Aspirin   . Pravastatin Sodium Other (See Comments)    Muscle aches    Medications: Patient's Medications  New Prescriptions   No medications on file  Previous Medications   ACCU-CHEK SOFTCLIX LANCETS LANCETS    Use one lancet daily to test blood sugar for Diabetes  E11.42   ALCOHOL SWABS (B-D SINGLE USE SWABS REGULAR) PADS    As directed for diabetes one daily to test blood E11.42   AMLODIPINE (NORVASC) 5 MG TABLET    Take 1 tablet (5 mg total) by mouth daily.   CHOLECALCIFEROL (VITAMIN D) 1000 UNITS TABLET    Take 1,000 Units by mouth daily.    FLUTICASONE (FLONASE) 50 MCG/ACT NASAL SPRAY    Place 2 sprays into the nose daily as needed.   GABAPENTIN (NEURONTIN) 400 MG CAPSULE    Take 1 capsule (400 mg total) by mouth 2 (two) times daily.   GLUCOSE  BLOOD (ACCU-CHEK AVIVA PLUS) TEST STRIP    Use one strip to test blood sugar once a day for  Diabetes E11.42   LORATADINE (CLARITIN) 10 MG TABLET    Take 10 mg by mouth daily.    METFORMIN (GLUCOPHAGE-XR) 500 MG 24 HR TABLET    Take two tablets by mouth twice daily to control blood sugar   METOPROLOL TARTRATE (LOPRESSOR) 25 MG TABLET    Take 0.5 tablets (12.5 mg total) by mouth 2 (two) times daily.   MULTIPLE VITAMINS-MINERALS (ICAPS AREDS FORMULA PO)    Take by mouth daily.   MULTIPLE VITAMINS-MINERALS (MULTIVITAMIN WITH MINERALS) TABLET    Take 1 tablet by mouth daily.   OMEGA-3 FATTY ACIDS (FISH OIL) 1000 MG CAPS    Take 2 capsules by mouth daily.   OMEPRAZOLE (PRILOSEC) 40 MG CAPSULE    Take 40 mg by mouth daily. Reported on 09/28/2015   OVER THE COUNTER MEDICATION    1 drop as needed. For dry eyes. Equate Brand Restore Tears Lubricant Eye Drops   POLYETHYLENE GLYCOL (MIRALAX / GLYCOLAX) PACKET    Take 17 g by mouth daily as needed. For constipation   PROBIOTIC PRODUCT (PROBIOTIC DAILY PO)    Take by mouth.   ROSUVASTATIN (CRESTOR) 5 MG TABLET    Take one tablet daily for cholesterol  Modified Medications   No medications on file  Discontinued Medications   DEXLANSOPRAZOLE (DEXILANT) 30 MG CAPSULE    Take one capsule by mouth once daily    Review of Systems  Unable to perform ROS: Other (memory loss)    Vitals:   02/03/16 0926  BP: 110/68  Pulse: 67  Temp: 98.3 F (36.8 C)  TempSrc: Oral  SpO2: 97%  Weight: 158 lb 12.8 oz (72 kg)  Height: _0  (1.676 m)   Body mass index is 25.63 kg/m.  Physical Exam  Constitutional: She appears well-developed and well-nourished.  HENT:  TMs intact b/l. Dull but no redness. No cerumen in external ear canal. Oropharynx cobblestoning with min redness but no exudate  Eyes: Pupils are equal, round, and reactive to light. No scleral icterus.  Neck: Neck supple. Carotid bruit is not present. No tracheal deviation present. No thyromegaly present.  Cardiovascular: Normal rate, regular rhythm, normal heart sounds and intact distal pulses.  Exam reveals no gallop and no friction rub.   No murmur heard. No LE edema b/l. no  calf TTP. Medial ankle varicose veins, TTP  Pulmonary/Chest: Effort normal and breath sounds normal. No stridor. No respiratory distress. She has no wheezes. She has no rales.  Abdominal: Soft. Bowel sounds are normal. She exhibits distension. She exhibits no mass. There is no hepatomegaly. There is tenderness (epigastric). There is no rebound and no guarding.  Musculoskeletal: She exhibits edema and tenderness.       Lumbar back: She exhibits decreased range of motion, tenderness and spasm.       Back:  Lymphadenopathy:    She has no cervical adenopathy.  Neurological: She is alert.  Skin: Skin is warm and dry. No rash noted.  Psychiatric: She has a normal mood and affect. Her behavior is normal.     Labs reviewed: Appointment on 02/01/2016  Component Date Value Ref Range Status  . Sodium 02/02/2016 143  135 - 146 mmol/L Final  . Potassium 02/02/2016 4.3  3.5 - 5.3 mmol/L Final  . Chloride 02/02/2016 106  98 - 110 mmol/L Final  . CO2 02/02/2016 28  20 - 31  mmol/L Final  . Glucose, Bld 02/02/2016 108* 65 - 99 mg/dL Final  . BUN 02/02/2016 15  7 - 25 mg/dL Final  . Creat 02/02/2016 0.83  0.50 - 0.99 mg/dL Final   Comment:   For patients > or = 68 years of age: The upper reference limit for Creatinine is approximately 13% higher for people identified as African-American.     . Calcium 02/02/2016 9.0  8.6 - 10.4 mg/dL Final  . ALT 02/02/2016 11  6 - 29 U/L Final  . Cholesterol 02/02/2016 168  125 - 200 mg/dL Final  . Triglycerides 02/02/2016 164* <150 mg/dL Final  . HDL 02/02/2016 55  >=46 mg/dL Final  . Total CHOL/HDL Ratio 02/02/2016 3.1  <=5.0 Ratio Final  . VLDL 02/02/2016 33* <30 mg/dL Final  . LDL Cholesterol 02/02/2016 80  <130 mg/dL Final   Comment:   Total Cholesterol/HDL Ratio:CHD Risk                        Coronary Heart Disease Risk Table                                        Men       Women          1/2 Average Risk              3.4        3.3               Average Risk              5.0        4.4           2X Average Risk              9.6        7.1           3X Average Risk             23.4       11.0 Use the calculated Patient Ratio above and the CHD Risk table  to determine the patient's CHD Risk.   . Hgb A1c MFr Bld 02/02/2016 6.4* <5.7 % Final   Comment:   For someone without known diabetes, a hemoglobin A1c value between 5.7% and 6.4% is consistent with prediabetes and should be confirmed with a follow-up test.   For someone with known diabetes, a value <7% indicates that their diabetes is well controlled. A1c targets should be individualized based on duration of diabetes, age, co-morbid conditions and other considerations.   This assay result is consistent with an increased risk of diabetes.   Currently, no consensus exists regarding use of hemoglobin A1c for diagnosis of diabetes in children.     . Mean Plasma Glucose 02/02/2016 137  mg/dL Final    No results found.   Assessment/Plan   ICD-9-CM ICD-10-CM   1. Chronic seasonal allergic rhinitis due to pollen 477.0 J30.1   2. Gastroesophageal reflux disease without esophagitis 530.81 K21.9   3. Essential hypertension 401.9 I10 CBC with Differential/Platelet  4. Type 2 diabetes mellitus with diabetic polyneuropathy, without long-term current use of insulin (HCC) 250.60 E11.42 CMP with eGFR   357.2  CBC with Differential/Platelet     Microalbumin / creatinine urine ratio     Hemoglobin A1C     Urinalysis with  Reflex Microscopic  5. Hyperlipidemia LDL goal <100 272.4 E78.5 Lipid panel     TSH  6. Pain in joint, multiple sites 719.49 M25.50   7. Short-term memory loss 780.93 R41.3   8. Palpitations 785.1 R00.2 CBC with Differential/Platelet    Resume claritin daily  Flu shot given today  Continue current medications as ordered  Follow up in 5 mos for CPE/ECG/MMSE. Fasting labs prior to appt  [place] S. Perlie Gold  [facility] [address] [place], [place] [address] [phone] Cell (Monday-Friday 8 AM - 5 PM) [phone] After 5 PM and follow prompts

## 2016-03-05 ENCOUNTER — Telehealth: Payer: Self-pay | Admitting: Internal Medicine

## 2016-03-05 NOTE — Telephone Encounter (Signed)
left msg asking pt to confirm this AWV appt w/ nurse. VDM (DD) °

## 2016-03-19 ENCOUNTER — Other Ambulatory Visit: Payer: Self-pay | Admitting: Internal Medicine

## 2016-05-17 ENCOUNTER — Telehealth: Payer: Self-pay

## 2016-05-17 MED ORDER — GLUCOSE BLOOD VI STRP: ORAL_STRIP | 11 refills | Status: DC

## 2016-05-17 MED ORDER — GABAPENTIN 400 MG PO CAPS: 400.0000 mg | ORAL_CAPSULE | Freq: Two times a day (BID) | ORAL | 3 refills | Status: DC

## 2016-05-17 MED ORDER — AMLODIPINE BESYLATE 5 MG PO TABS: 5.0000 mg | ORAL_TABLET | Freq: Every day | ORAL | 3 refills | Status: DC

## 2016-05-17 MED ORDER — BD SWAB SINGLE USE REGULAR PADS: MEDICATED_PAD | 11 refills | Status: DC

## 2016-05-17 MED ORDER — ACCU-CHEK SOFTCLIX LANCETS MISC: 11 refills | Status: DC

## 2016-05-17 MED ORDER — METFORMIN HCL ER 500 MG PO TB24: ORAL_TABLET | ORAL | 3 refills | Status: DC

## 2016-05-17 MED ORDER — ROSUVASTATIN CALCIUM 5 MG PO TABS: ORAL_TABLET | ORAL | 3 refills | Status: DC

## 2016-05-17 MED ORDER — METOPROLOL TARTRATE 25 MG PO TABS: 12.5000 mg | ORAL_TABLET | Freq: Two times a day (BID) | ORAL | 3 refills | Status: DC

## 2016-05-17 NOTE — Telephone Encounter (Signed)
Message left on clinical intake voicemail: patient was requesting a refill, I was unable to make out which medication patient was requesting due to a bad connection.  Left message on voicemail for patient to return call when available to clarify which medication she needs and verify pharmacy location

## 2016-05-18 ENCOUNTER — Other Ambulatory Visit: Payer: Self-pay | Admitting: Internal Medicine

## 2016-06-01 ENCOUNTER — Encounter: Payer: Self-pay | Admitting: Internal Medicine

## 2016-07-03 ENCOUNTER — Other Ambulatory Visit: Payer: Medicare HMO

## 2016-07-03 ENCOUNTER — Ambulatory Visit (INDEPENDENT_AMBULATORY_CARE_PROVIDER_SITE_OTHER): Payer: Medicare HMO

## 2016-07-03 VITALS — BP 118/64 | HR 79 | Temp 97.6°F | Ht 66.0 in | Wt 153.2 lb

## 2016-07-03 DIAGNOSIS — Z1231 Encounter for screening mammogram for malignant neoplasm of breast: Secondary | ICD-10-CM

## 2016-07-03 DIAGNOSIS — E785 Hyperlipidemia, unspecified: Secondary | ICD-10-CM

## 2016-07-03 DIAGNOSIS — Z1239 Encounter for other screening for malignant neoplasm of breast: Secondary | ICD-10-CM

## 2016-07-03 DIAGNOSIS — Z0000 Encounter for general adult medical examination without abnormal findings: Secondary | ICD-10-CM | POA: Diagnosis not present

## 2016-07-03 DIAGNOSIS — E1142 Type 2 diabetes mellitus with diabetic polyneuropathy: Secondary | ICD-10-CM

## 2016-07-03 DIAGNOSIS — R002 Palpitations: Secondary | ICD-10-CM | POA: Diagnosis not present

## 2016-07-03 DIAGNOSIS — I10 Essential (primary) hypertension: Secondary | ICD-10-CM | POA: Diagnosis not present

## 2016-07-03 LAB — COMPLETE METABOLIC PANEL WITH GFR
ALT: 9 U/L (ref 6–29)
AST: 13 U/L (ref 10–35)
Albumin: 4.1 g/dL (ref 3.6–5.1)
Alkaline Phosphatase: 50 U/L (ref 33–130)
BUN: 17 mg/dL (ref 7–25)
CO2: 27 mmol/L (ref 20–31)
Calcium: 9.2 mg/dL (ref 8.6–10.4)
Chloride: 106 mmol/L (ref 98–110)
Creat: 1.08 mg/dL — ABNORMAL HIGH (ref 0.50–0.99)
GFR, Est African American: 61 mL/min (ref >=60)
GFR, Est Non African American: 53 mL/min — ABNORMAL LOW (ref >=60)
Glucose, Bld: 108 mg/dL — ABNORMAL HIGH (ref 65–99)
Potassium: 4.5 mmol/L (ref 3.5–5.3)
Sodium: 142 mmol/L (ref 135–146)
Total Bilirubin: 0.3 mg/dL (ref 0.2–1.2)
Total Protein: 6.7 g/dL (ref 6.1–8.1)

## 2016-07-03 LAB — CBC WITH DIFFERENTIAL/PLATELET
Basophils Absolute: 52 cells/uL (ref 0–200)
Basophils Relative: 1 %
Eosinophils Absolute: 312 cells/uL (ref 15–500)
Eosinophils Relative: 6 %
HCT: 41.5 % (ref 35.0–45.0)
Hemoglobin: 13.8 g/dL (ref 11.7–15.5)
Lymphocytes Relative: 47 %
Lymphs Abs: 2444 cells/uL (ref 850–3900)
MCH: 28.3 pg (ref 27.0–33.0)
MCHC: 33.3 g/dL (ref 32.0–36.0)
MCV: 85 fL (ref 80.0–100.0)
MPV: 9.5 fL (ref 7.5–12.5)
Monocytes Absolute: 520 cells/uL (ref 200–950)
Monocytes Relative: 10 %
Neutro Abs: 1872 cells/uL (ref 1500–7800)
Neutrophils Relative %: 36 %
Platelets: 187 10*3/uL (ref 140–400)
RBC: 4.88 MIL/uL (ref 3.80–5.10)
RDW: 14.4 % (ref 11.0–15.0)
WBC: 5.2 10*3/uL (ref 3.8–10.8)

## 2016-07-03 LAB — LIPID PANEL
Cholesterol: 122 mg/dL (ref <200)
HDL: 54 mg/dL (ref >50)
LDL Cholesterol: 39 mg/dL (ref <100)
Total CHOL/HDL Ratio: 2.3 Ratio (ref <5.0)
Triglycerides: 143 mg/dL (ref <150)
VLDL: 29 mg/dL (ref <30)

## 2016-07-03 LAB — TSH: TSH: 2.45 mIU/L

## 2016-07-03 NOTE — Patient Instructions (Signed)
[name] , Thank you for taking time to come for your Medicare Wellness Visit. I appreciate your ongoing commitment to your health goals. Please review the following plan we discussed and let me know if I can assist you in the future.   Screening recommendations/referrals: Colonoscopy up to date. Due 2027 Mammogram due 07/2016. We will put in referral for you Bone Density up to date. Recommended yearly ophthalmology/optometry visit for glaucoma screening and checkup Recommended yearly dental visit for hygiene and checkup  Vaccinations: Influenza vaccine up to date Pneumococcal vaccine you stated were up to date. I will check in with Dr. Alroy Dust for records Tdap vaccine due. If you decide you want it after talking to insurance call us and we will put in referral Shingles vaccine due. Pt declined.  Advanced directives: In chart  Conditions/risks identified: None  Next appointment: Sierra Ray 3/26 @ 10:45 am   Preventive Care 69 Years and Older, Female Preventive care refers to lifestyle choices and visits with your health care provider that can promote health and wellness. What does preventive care include?  A yearly physical exam. This is also called an annual well check.  Dental exams once or twice a year.  Routine eye exams. Ask your health care provider how often you should have your eyes checked.  Personal lifestyle choices, including:  Daily care of your teeth and gums.  Regular physical activity.  Eating a healthy diet.  Avoiding tobacco and drug use.  Limiting alcohol use.  Practicing safe sex.  Taking low-dose aspirin every day.  Taking vitamin and mineral supplements as recommended by your health care provider. What happens during an annual well check? The services and screenings done by your health care provider during your annual well check will depend on your age, overall health, lifestyle risk factors, and family history of disease. Counseling  Your  health care provider may ask you questions about your:  Alcohol use.  Tobacco use.  Drug use.  Emotional well-being.  Home and relationship well-being.  Sexual activity.  Eating habits.  History of falls.  Memory and ability to understand (cognition).  Work and work Statistician.  Reproductive health. Screening  You may have the following tests or measurements:  Height, weight, and BMI.  Blood pressure.  Lipid and cholesterol levels. These may be checked every 5 years, or more frequently if you are over 48 years old.  Skin check.  Lung cancer screening. You may have this screening every year starting at age 62 if you have a 30-pack-year history of smoking and currently smoke or have quit within the past 15 years.  Fecal occult blood test (FOBT) of the stool. You may have this test every year starting at age 22.  Flexible sigmoidoscopy or colonoscopy. You may have a sigmoidoscopy every 5 years or a colonoscopy every 10 years starting at age 30.  Hepatitis C blood test.  Hepatitis B blood test.  Sexually transmitted disease (STD) testing.  Diabetes screening. This is done by checking your blood sugar (glucose) after you have not eaten for a while (fasting). You may have this done every 1-3 years.  Bone density scan. This is done to screen for osteoporosis. You may have this done starting at age 9.  Mammogram. This may be done every 1-2 years. Talk to your health care provider about how often you should have regular mammograms. Talk with your health care provider about your test results, treatment options, and if necessary, the need for more tests. Vaccines  Your health care provider may recommend certain vaccines, such as:  Influenza vaccine. This is recommended every year.  Tetanus, diphtheria, and acellular pertussis (Tdap, Td) vaccine. You may need a Td booster every 10 years.  Zoster vaccine. You may need this after age 17.  Pneumococcal 13-valent  conjugate (PCV13) vaccine. One dose is recommended after age 14.  Pneumococcal polysaccharide (PPSV23) vaccine. One dose is recommended after age 55. Talk to your health care provider about which screenings and vaccines you need and how often you need them. This information is not intended to replace advice given to you by your health care provider. Make sure you discuss any questions you have with your health care provider. Document Released: 04/29/2015 Document Revised: 12/21/2015 Document Reviewed: 02/01/2015 Elsevier Interactive Patient Education  2017 [place] Prevention in the Home Falls can cause injuries. They can happen to people of all ages. There are many things you can do to make your home safe and to help prevent falls. What can I do on the outside of my home?  Regularly fix the edges of walkways and driveways and fix any cracks.  Remove anything that might make you trip as you walk through a door, such as a raised step or threshold.  Trim any bushes or trees on the path to your home.  Use bright outdoor lighting.  Clear any walking paths of anything that might make someone trip, such as rocks or tools.  Regularly check to see if handrails are loose or broken. Make sure that both sides of any steps have handrails.  Any raised decks and porches should have guardrails on the edges.  Have any leaves, snow, or ice cleared regularly.  Use sand or salt on walking paths during winter.  Clean up any spills in your garage right away. This includes oil or grease spills. What can I do in the bathroom?  Use night lights.  Install grab bars by the toilet and in the tub and shower. Do not use towel bars as grab bars.  Use non-skid mats or decals in the tub or shower.  If you need to sit down in the shower, use a plastic, non-slip stool.  Keep the floor dry. Clean up any water that spills on the floor as soon as it happens.  Remove soap buildup in the tub or  shower regularly.  Attach bath mats securely with double-sided non-slip rug tape.  Do not have throw rugs and other things on the floor that can make you trip. What can I do in the bedroom?  Use night lights.  Make sure that you have a light by your bed that is easy to reach.  Do not use any sheets or blankets that are too big for your bed. They should not hang down onto the floor.  Have a firm chair that has side arms. You can use this for support while you get dressed.  Do not have throw rugs and other things on the floor that can make you trip. What can I do in the kitchen?  Clean up any spills right away.  Avoid walking on wet floors.  Keep items that you use a lot in easy-to-reach places.  If you need to reach something above you, use a strong step stool that has a grab bar.  Keep electrical cords out of the way.  Do not use floor polish or wax that makes floors slippery. If you must use wax, use non-skid floor wax.  Do  not have throw rugs and other things on the floor that can make you trip. What can I do with my stairs?  Do not leave any items on the stairs.  Make sure that there are handrails on both sides of the stairs and use them. Fix handrails that are broken or loose. Make sure that handrails are as long as the stairways.  Check any carpeting to make sure that it is firmly attached to the stairs. Fix any carpet that is loose or worn.  Avoid having throw rugs at the top or bottom of the stairs. If you do have throw rugs, attach them to the floor with carpet tape.  Make sure that you have a light switch at the top of the stairs and the bottom of the stairs. If you do not have them, ask someone to add them for you. What else can I do to help prevent falls?  Wear shoes that:  Do not have high heels.  Have rubber bottoms.  Are comfortable and fit you well.  Are closed at the toe. Do not wear sandals.  If you use a stepladder:  Make sure that it is fully  opened. Do not climb a closed stepladder.  Make sure that both sides of the stepladder are locked into place.  Ask someone to hold it for you, if possible.  Clearly mark and make sure that you can see:  Any grab bars or handrails.  First and last steps.  Where the edge of each step is.  Use tools that help you move around (mobility aids) if they are needed. These include:  Canes.  Walkers.  Scooters.  Crutches.  Turn on the lights when you go into a dark area. Replace any light bulbs as soon as they burn out.  Set up your furniture so you have a clear path. Avoid moving your furniture around.  If any of your floors are uneven, fix them.  If there are any pets around you, be aware of where they are.  Review your medicines with your doctor. Some medicines can make you feel dizzy. This can increase your chance of falling. Ask your doctor what other things that you can do to help prevent falls. This information is not intended to replace advice given to you by your health care provider. Make sure you discuss any questions you have with your health care provider. Document Released: 01/27/2009 Document Revised: 09/08/2015 Document Reviewed: 05/07/2014 Elsevier Interactive Patient Education  2017 [employer].

## 2016-07-03 NOTE — Progress Notes (Signed)
Quick Notes   Health Maintenance: Pt due for mammogram, eye exam, TDAP, hep c screen and shingles. Pt declined shingles and will make own eye exam appointment. I put in referral for mammogram.     Abnormal Screen: MMSE 29/30. Did not pass clock test     Patient Concerns: Pain in carotids, low stomach pain, and wanted to talk to you about getting a renal panel done.     Nurse Concerns: PHQ 10

## 2016-07-03 NOTE — Progress Notes (Signed)
Subjective:   Lilian Murdoch V. is a 71 y.o. female who presents for an Initial Medicare Annual Wellness Visit. Cardiac Risk Factors include: hypertension;family history of premature cardiovascular disease;dyslipidemia;diabetes mellitus;advanced age (>61men, >50 women)     Objective:    Today's Vitals   07/03/16 0837 07/03/16 0840  BP: 118/64   Pulse: 79   Temp: 97.6 F (36.4 C)   TempSrc: Oral   SpO2: 98%   Weight: 153 lb 3.2 oz (69.5 kg)   Height: 5\' 6"  (1.676 m)   PainSc:  8    Body mass index is 24.73 kg/m.   Current Medications (verified) Outpatient Encounter Prescriptions as of 07/03/2016  Medication Sig  . ACCU-CHEK AVIVA PLUS test strip TEST BLOOD SUGAR ONE TIME DAILY FOR DIABETES  . ACCU-CHEK SOFTCLIX LANCETS lancets USE  TO TEST BLOOD SUGAR ONE TIME DAILY FOR DIABETES  . Alcohol Swabs (B-D SINGLE USE SWABS REGULAR) PADS As directed for diabetes one daily to test blood E11.42  . amLODipine (NORVASC) 5 MG tablet Take 1 tablet (5 mg total) by mouth daily.  . cholecalciferol (VITAMIN D) 1000 UNITS tablet Take 1,000 Units by mouth daily.   . fluticasone (FLONASE) 50 MCG/ACT nasal spray Place 2 sprays into the nose daily as needed.  . gabapentin (NEURONTIN) 400 MG capsule Take 1 capsule (400 mg total) by mouth 2 (two) times daily.  Marland Kitchen loratadine (CLARITIN) 10 MG tablet Take 10 mg by mouth daily.  . metFORMIN (GLUCOPHAGE-XR) 500 MG 24 hr tablet Take two tablets by mouth twice daily to control blood sugar  . metoprolol tartrate (LOPRESSOR) 25 MG tablet Take 0.5 tablets (12.5 mg total) by mouth 2 (two) times daily.  . Multiple Vitamins-Minerals (ICAPS AREDS FORMULA PO) Take by mouth daily.  . Multiple Vitamins-Minerals (MULTIVITAMIN WITH MINERALS) tablet Take 1 tablet by mouth daily.  . Omega-3 Fatty Acids (FISH OIL) 1000 MG CAPS Take 2 capsules by mouth daily.  Marland Kitchen OVER THE COUNTER MEDICATION 1 drop as needed. For dry eyes. Equate Brand Restore Tears Lubricant Eye Drops    . polyethylene glycol (MIRALAX / GLYCOLAX) packet Take 17 g by mouth daily as needed. For constipation  . rosuvastatin (CRESTOR) 5 MG tablet TAKE 1 TABLET BY MOUTH DAILY FOR CHOLESTEROL  . [DISCONTINUED] Probiotic Product (PROBIOTIC DAILY PO) Take by mouth.   No facility-administered encounter medications on file as of 07/03/2016.     Allergies (verified) Aspirin and Pravastatin sodium   History: Past Medical History:  Diagnosis Date  . Abnormal Pap smear 05/08/2011   ASC-US +HPV  . Arthritis   . Asthma   . Diabetes mellitus type 2 with neurological manifestations ([place])   . Diabetic peripheral neuropathy ([place])   . Generalized osteoarthritis   . GERD (gastroesophageal reflux disease)   . Hx of seasonal allergies   . Hyperlipidemia   . Hypertension   . IBS (irritable bowel syndrome)   . S/P total hysterectomy 06/21/2011   Past Surgical History:  Procedure Laterality Date  . OVARIAN CYST SURGERY  1986  . TONSILECTOMY, ADENOIDECTOMY, BILATERAL MYRINGOTOMY AND TUBES  1974  . VAGINAL HYSTERECTOMY  1983   Family History  Problem Relation Age of Onset  . Diabetes Father   . Heart disease Father   . Diabetes Mother   . Pancreatic cancer Brother   . Pancreatic cancer Sister    Social History   Occupational History  .  Retired   Social History Main Topics  . Smoking status: Never Smoker  .  Smokeless tobacco: Never Used  . Alcohol use No  . Drug use: No  . Sexual activity: Not Currently    Birth control/ protection: Surgical    Tobacco Counseling Counseling given: Not Answered   Activities of Daily Living In your present state of health, do you have any difficulty performing the following activities: 07/03/2016  Hearing? N  Vision? N  Difficulty concentrating or making decisions? Y  Walking or climbing stairs? N  Dressing or bathing? N  Doing errands, shopping? Y  Preparing Food and eating ? N  Using the Toilet? N  In the past six months, have you accidently  leaked urine? N  Do you have problems with loss of bowel control? N  Managing your Medications? N  Managing your Finances? N  Housekeeping or managing your Housekeeping? N  Some recent data might be hidden    Immunizations and Health Maintenance Immunization History  Administered Date(s) Administered  . Influenza,inj,Quad PF,36+ Mos 02/03/2016   Health Maintenance Due  Topic Date Due  . Hepatitis C Screening  05/18/1945  . OPHTHALMOLOGY EXAM  04/23/1957  . TETANUS/TDAP  04/23/1966  . PNA vac Low Risk Adult (1 of 2 - PCV13) 04/23/2012  . URINE MICROALBUMIN  05/26/2016  . FOOT EXAM  06/30/2016    Patient Care Team: Gildardo Cranker, DO as PCP - General (Internal Medicine)  Indicate any recent Medical Services you may have received from other than [facility] providers in the past year (date may be approximate).     Assessment:   This is a routine wellness examination for Tandi.   Hearing/Vision screen No exam data present  Dietary issues and exercise activities discussed: Current Exercise Habits: Home exercise routine, Time (Minutes): 20, Frequency (Times/Week): 3, Weekly Exercise (Minutes/Week): 60, Intensity: Mild  Goals    . Exercise 3x per week (30 min per time)          Starting 07/03/16 I will start doing exercise three days a week.I would like to walk a total of 30 min three times a week and eventually get to aquatics.      Depression Screen PHQ 2/9 Scores 07/03/2016 08/25/2015 07/01/2015 05/27/2015  PHQ - 2 Score 3 2 0 2  PHQ- 9 Score 10 - 0 5    Fall Risk Fall Risk  07/03/2016 02/03/2016 09/28/2015 08/25/2015 07/01/2015  Falls in the past year? No No No No No    Cognitive Function: MMSE - Mini Mental State Exam 07/03/2016 07/01/2015  Not completed: - (No Data)  Orientation to time 5 5  Orientation to Place 5 5  Registration 3 3  Attention/ Calculation 5 5  Recall 2 3  Language- name 2 objects 2 2  Language- repeat 1 1  Language- follow 3 step command 3 3    Language- read & follow direction 1 1  Write a sentence 1 1  Copy design 1 1  Total score 29 30        Screening Tests Health Maintenance  Topic Date Due  . Hepatitis C Screening  December 20, 1945  . OPHTHALMOLOGY EXAM  04/23/1957  . TETANUS/TDAP  04/23/1966  . PNA vac Low Risk Adult (1 of 2 - PCV13) 04/23/2012  . URINE MICROALBUMIN  05/26/2016  . FOOT EXAM  06/30/2016  . HEMOGLOBIN A1C  08/01/2016  . MAMMOGRAM  08/09/2016  . COLONOSCOPY  09/05/2025  . INFLUENZA VACCINE  Completed  . DEXA SCAN  Completed      Plan:    I have personally reviewed  and addressed the Medicare Annual Wellness questionnaire and have noted the following in the patient's chart:  A. Medical and social history B. Use of alcohol, tobacco or illicit drugs  C. Current medications and supplements D. Functional ability and status E.  Nutritional status F.  Physical activity G. Advance directives H. List of other physicians I.  Hospitalizations, surgeries, and ER visits in previous 12 months J.  [place] to include hearing, vision, cognitive, depression L. Referrals and appointments - none  In addition, I have reviewed and discussed with patient certain preventive protocols, quality metrics, and best practice recommendations. A written personalized care plan for preventive services as well as general preventive health recommendations were provided to patient.  See attached scanned questionnaire for additional information.   Signed,   Rich Reining, RN Nurse Health Advisor  I reviewed health advisors note, was available for consultation and agree with documentation and plan.  Carlos American. Harle Battiest  [facility] Adult [facility] [phone] 8 am - 5 pm) [phone] (after hours)

## 2016-07-04 LAB — URINALYSIS, ROUTINE W REFLEX MICROSCOPIC
Bilirubin Urine: NEGATIVE
Glucose, UA: NEGATIVE
Hgb urine dipstick: NEGATIVE
Ketones, ur: NEGATIVE
Leukocytes, UA: NEGATIVE
Nitrite: NEGATIVE
Protein, ur: NEGATIVE
Specific Gravity, Urine: 1.016 (ref 1.001–1.035)
pH: 5.5 (ref 5.0–8.0)

## 2016-07-04 LAB — HEMOGLOBIN A1C
Hgb A1c MFr Bld: 6.2 % — ABNORMAL HIGH (ref <5.7)
Mean Plasma Glucose: 131 mg/dL

## 2016-07-05 LAB — MICROALBUMIN / CREATININE URINE RATIO
Creatinine, Urine: 218 mg/dL (ref 20–320)
Microalb Creat Ratio: 11 mcg/mg creat (ref <30)
Microalb, Ur: 2.5 mg/dL

## 2016-07-06 ENCOUNTER — Ambulatory Visit: Payer: Commercial Managed Care - HMO

## 2016-07-06 ENCOUNTER — Encounter: Payer: Commercial Managed Care - HMO | Admitting: Internal Medicine

## 2016-07-09 ENCOUNTER — Ambulatory Visit (INDEPENDENT_AMBULATORY_CARE_PROVIDER_SITE_OTHER): Payer: Medicare HMO | Admitting: Nurse Practitioner

## 2016-07-09 ENCOUNTER — Encounter: Payer: Self-pay | Admitting: Nurse Practitioner

## 2016-07-09 VITALS — BP 114/78 | HR 74 | Temp 97.4°F | Resp 18 | Ht 66.0 in | Wt 156.2 lb

## 2016-07-09 DIAGNOSIS — E785 Hyperlipidemia, unspecified: Secondary | ICD-10-CM

## 2016-07-09 DIAGNOSIS — K219 Gastro-esophageal reflux disease without esophagitis: Secondary | ICD-10-CM

## 2016-07-09 DIAGNOSIS — Z0000 Encounter for general adult medical examination without abnormal findings: Secondary | ICD-10-CM | POA: Diagnosis not present

## 2016-07-09 DIAGNOSIS — E1142 Type 2 diabetes mellitus with diabetic polyneuropathy: Secondary | ICD-10-CM

## 2016-07-09 DIAGNOSIS — I10 Essential (primary) hypertension: Secondary | ICD-10-CM

## 2016-07-09 DIAGNOSIS — R079 Chest pain, unspecified: Secondary | ICD-10-CM | POA: Diagnosis not present

## 2016-07-09 DIAGNOSIS — J301 Allergic rhinitis due to pollen: Secondary | ICD-10-CM

## 2016-07-09 NOTE — Progress Notes (Signed)
Careteam: Patient Care Team: Sierra Cranker, DO as PCP - General (Internal Medicine)  Advanced Directive information Does Patient Have a Medical Advance Directive?: Yes, Type of Advance Directive: Living will  Allergies  Allergen Reactions  . Aspirin   . Pravastatin Sodium Other (See Comments)    Muscle aches    Chief Complaint  Patient presents with  . Medical Management of Chronic Issues    Pt is being seen for complete physical. MMSE done.      HPI: Patient is a 71 y.o. female seen in the office today for her annual complete physical exam. Pt was seen for AWV on 07/03/16; She does have complaints today of mild dizziness, generalized abdominal pain, and  intermittent chest pain with SOB. Her dizziness began 4-5 days ago and is not associated with changes in position, vomiting, headaches, but she has had visual disturbances. She does admit to recent changes in her environmental allergies. She takes flonase and claritin.  She also reports changes in vision and her expired glasses prescription. She has a long standing problem with abdominal pain in which she is followed by GI. No new acute changes. She is interested in a referral for this. In the past, she has been seen by cardiology for similar symptoms. EKG completed and was WNL. Denies current pain, radiation, dyspnea, numbness, tingling, or loss of consciousness.She is additionally interested in a Cardiology referral.   Review of Systems:  Review of Systems  Constitutional: Positive for appetite change. Negative for activity change, chills, diaphoresis, fatigue, fever and unexpected weight change.  HENT: Positive for postnasal drip and rhinorrhea. Negative for congestion, dental problem, ear discharge, ear pain, hearing loss, mouth sores, sinus pain, sinus pressure, sneezing and sore throat.   Eyes: Positive for visual disturbance. Negative for discharge, redness and itching.       Pt is in need of a complete eye exam with  correctional lens adjustment  Respiratory: Positive for shortness of breath. Negative for cough, chest tightness, wheezing and stridor.   Cardiovascular: Positive for chest pain. Negative for palpitations and leg swelling.  Gastrointestinal: Positive for abdominal pain, constipation and nausea. Negative for abdominal distention, anal bleeding, blood in stool, diarrhea and vomiting.  Endocrine: Negative for polydipsia, polyphagia and polyuria.  Genitourinary: Negative for dyspareunia, dysuria, flank pain, frequency, menstrual problem, pelvic pain, urgency, vaginal bleeding, vaginal discharge and vaginal pain.  Musculoskeletal: Positive for back pain and gait problem. Negative for joint swelling, myalgias, neck pain and neck stiffness.  Skin: Negative for color change, pallor, rash and wound.  Allergic/Immunologic: Positive for environmental allergies. Negative for food allergies.  Neurological: Positive for dizziness and weakness. Negative for tremors, light-headedness, numbness and headaches.  Psychiatric/Behavioral: Negative for agitation, behavioral problems, confusion, decreased concentration, hallucinations and sleep disturbance. The patient is not nervous/anxious.     Past Medical History:  Diagnosis Date  . Abnormal Pap smear 05/08/2011   ASC-US +HPV  . Arthritis   . Asthma   . Diabetes mellitus type 2 with neurological manifestations ([place])   . Diabetic peripheral neuropathy ([place])   . Generalized osteoarthritis   . GERD (gastroesophageal reflux disease)   . Hx of seasonal allergies   . Hyperlipidemia   . Hypertension   . IBS (irritable bowel syndrome)   . S/P total hysterectomy 06/21/2011   Past Surgical History:  Procedure Laterality Date  . OVARIAN CYST SURGERY  1986  . TONSILECTOMY, ADENOIDECTOMY, BILATERAL MYRINGOTOMY AND TUBES  1974  . [place]  Social History:   reports that she has never smoked. She has never used smokeless tobacco. She reports that  she does not drink alcohol or use drugs.  Family History  Problem Relation Age of Onset  . Diabetes Father   . Heart disease Father   . Diabetes Mother   . Pancreatic cancer Brother   . Pancreatic cancer Sister     Medications: Patient's Medications  New Prescriptions   No medications on file  Previous Medications   ACCU-CHEK AVIVA PLUS TEST STRIP    TEST BLOOD SUGAR ONE TIME DAILY FOR DIABETES   ACCU-CHEK SOFTCLIX LANCETS LANCETS    USE  TO TEST BLOOD SUGAR ONE TIME DAILY FOR DIABETES   ALCOHOL SWABS (B-D SINGLE USE SWABS REGULAR) PADS    As directed for diabetes one daily to test blood E11.42   AMLODIPINE (NORVASC) 5 MG TABLET    Take 1 tablet (5 mg total) by mouth daily.   BLOOD GLUCOSE MONITORING SUPPL (ACCU-CHEK AVIVA PLUS) W/DEVICE KIT       CHOLECALCIFEROL (VITAMIN D) 1000 UNITS TABLET    Take 1,000 Units by mouth daily.    FLUTICASONE (FLONASE) 50 MCG/ACT NASAL SPRAY    Place 2 sprays into the nose daily as needed.   GABAPENTIN (NEURONTIN) 400 MG CAPSULE    Take 1 capsule (400 mg total) by mouth 2 (two) times daily.   LORATADINE (CLARITIN) 10 MG TABLET    Take 10 mg by mouth daily.   METFORMIN (GLUCOPHAGE-XR) 500 MG 24 HR TABLET    Take two tablets by mouth twice daily to control blood sugar   METOPROLOL TARTRATE (LOPRESSOR) 25 MG TABLET    Take 0.5 tablets (12.5 mg total) by mouth 2 (two) times daily.   MULTIPLE VITAMINS-MINERALS (ICAPS AREDS FORMULA PO)    Take by mouth daily.   MULTIPLE VITAMINS-MINERALS (MULTIVITAMIN WITH MINERALS) TABLET    Take 1 tablet by mouth daily.   OMEGA-3 FATTY ACIDS (FISH OIL) 1000 MG CAPS    Take 2 capsules by mouth daily.   OVER THE COUNTER MEDICATION    1 drop as needed. For dry eyes. Equate Brand Restore Tears Lubricant Eye Drops   POLYETHYLENE GLYCOL (MIRALAX / GLYCOLAX) PACKET    Take 17 g by mouth daily as needed. For constipation   ROSUVASTATIN (CRESTOR) 5 MG TABLET    TAKE 1 TABLET BY MOUTH DAILY FOR CHOLESTEROL  Modified Medications    No medications on file  Discontinued Medications   No medications on file     Physical Exam:  Vitals:   07/09/16 1055  BP: 114/78  Pulse: 74  Resp: 18  Temp: 97.4 F (36.3 C)  TempSrc: Oral  SpO2: 98%  Weight: 156 lb 3.2 oz (70.9 kg)  Height: '5\' 6"'  (1.676 m)   Body mass index is 25.21 kg/m.  Physical Exam  Constitutional: She is oriented to person, place, and time. She appears well-developed and well-nourished.  Non-toxic appearance. She does not have a sickly appearance. No distress.  HENT:  Head: Normocephalic and atraumatic.  Right Ear: External ear normal. Tympanic membrane is not bulging. No decreased hearing is noted.  Left Ear: External ear normal. Tympanic membrane is not bulging. No decreased hearing is noted.  Nose: Rhinorrhea present.  Mouth/Throat: Oropharynx is clear and moist and mucous membranes are normal. No oropharyngeal exudate.  Eyes: Conjunctivae, EOM and lids are normal. Pupils are equal, round, and reactive to light. Right eye exhibits no discharge. Left eye exhibits no  discharge. No scleral icterus.  Neck: Normal range of motion. Neck supple. No JVD present. No tracheal deviation present. No thyromegaly present.  Cardiovascular: Normal rate, regular rhythm, S1 normal, S2 normal, normal heart sounds and intact distal pulses.  Exam reveals no distant heart sounds and no friction rub.   Pulses:      Dorsalis pedis pulses are 1+ on the right side, and 1+ on the left side.  Pulmonary/Chest: Effort normal and breath sounds normal. No accessory muscle usage. No respiratory distress.  Abdominal: Soft. Normal appearance. She exhibits no distension, no pulsatile liver, no abdominal bruit, no pulsatile midline mass and no mass. Bowel sounds are increased. There is generalized tenderness. There is no rigidity, no rebound, no guarding, no tenderness at McBurney's point and negative Murphy's sign.  Musculoskeletal: Normal range of motion.  Lymphadenopathy:    She has  no cervical adenopathy.  Neurological: She is alert and oriented to person, place, and time. She has normal reflexes. She displays no tremor. No cranial nerve deficit or sensory deficit. GCS eye subscore is 4. GCS verbal subscore is 5. GCS motor subscore is 6.  Reflex Scores:      Tricep reflexes are 2+ on the right side and 2+ on the left side.      Patellar reflexes are 2+ on the right side and 2+ on the left side.      Achilles reflexes are 2+ on the right side and 2+ on the left side. Muscle strength left lower extremity 4+  Skin: Skin is warm, dry and intact. No ecchymosis, no laceration, no lesion and no rash noted. She is not diaphoretic. No cyanosis or erythema. Nails show no clubbing.  Psychiatric: She has a normal mood and affect. Her speech is normal and behavior is normal. Judgment and thought content normal. Cognition and memory are normal.    Labs reviewed: Basic Metabolic Panel:  Recent Labs  09/26/15 0837 02/01/16 0804 07/03/16 0825  NA 144 143 142  K 4.6 4.3 4.5  CL 102 106 106  CO2 '25 28 27  ' GLUCOSE 102* 108* 108*  BUN '20 15 17  ' CREATININE 0.86 0.83 1.08*  CALCIUM 9.4 9.0 9.2  TSH  --   --  2.45   Liver Function Tests:  Recent Labs  09/26/15 0837 02/01/16 0804 07/03/16 0825  AST  --   --  13  ALT '12 11 9  ' ALKPHOS  --   --  50  BILITOT  --   --  0.3  PROT  --   --  6.7  ALBUMIN  --   --  4.1   No results for input(s): LIPASE, AMYLASE in the last 8760 hours. No results for input(s): AMMONIA in the last 8760 hours. CBC:  Recent Labs  07/03/16 0825  WBC 5.2  NEUTROABS 1,872  HGB 13.8  HCT 41.5  MCV 85.0  PLT 187   Lipid Panel:  Recent Labs  09/26/15 0837 02/01/16 0804 07/03/16 0825  CHOL 139 168 122  HDL 57 55 54  LDLCALC 55 80 39  TRIG 134 164* 143  CHOLHDL 2.4 3.1 2.3   TSH:  Recent Labs  07/03/16 0825  TSH 2.45   A1C: Lab Results  Component Value Date   HGBA1C 6.2 (H) 07/03/2016   MMSE - Mini Mental State Exam  07/03/2016 07/01/2015  Not completed: - (No Data)  Orientation to time 5 5  Orientation to Place 5 5  Registration 3 3  Attention/ Calculation 5  5  Recall 2 3  Language- name 2 objects 2 2  Language- repeat 1 1  Language- follow 3 step command 3 3  Language- read & follow direction 1 1  Write a sentence 1 1  Copy design 1 1  Total score 29 30     Assessment/Plan 1. Wellness examination - Ambulatory referral to Obstetrics / Gynecology -mammogram referral has been placed during AWV, reinforced proper diet and exercise. MMSE 29/30 -needing diabetic eye exam, referral placed.   2. Essential hypertension -BP controlled on amlodipine, 114/78 today - EKG 12-Lead completed, NSR  3. Chest pain, unspecified type -EKG completed, NSR -No signs of ACS, most likely GERD related however does have some association with shortness of breath with chest pains in the past, no current pains but would like to see a cardiologist.  - Ambulatory referral to Cardiology  4. Gastroesophageal reflux disease without esophagitis -Avoid exacerbating foods -Continue current regimen, would not like to try any additional medication without seeing gastroenterologist at this time. - Ambulatory referral to Gastroenterology  5. Chronic seasonal allergic rhinitis due to pollen -Continue taking Flonase and Claritin for allergy flares -Educated to take medication everyday for adequate relief -If possible, avoid exacerbating allergens.  6. Type 2 diabetes mellitus with diabetic polyneuropathy, without long-term current use of insulin ([place]) -Foot exam completed today with no complications.  -ZOX0R=6.0, 07/03/16 - Ambulatory referral to Ophthalmology  7. Hyperlipidemia LDL goal <100 -Lipid panel completed 3/20, WNL -Continue low saturated fat, high complex carbohydrate diet  Evann Koelzer K. Harle Battiest  [facility] [phone] 8 am - 5 pm) [phone] (after hours)

## 2016-07-17 ENCOUNTER — Telehealth: Payer: Self-pay

## 2016-07-17 DIAGNOSIS — Z124 Encounter for screening for malignant neoplasm of cervix: Secondary | ICD-10-CM

## 2016-07-17 NOTE — Telephone Encounter (Signed)
-----   Message from Marchia Meiers sent at 07/17/2016  8:44 AM EDT ----- Regarding: RE: Change provider Contact: [phone] Patient is already scheduled with Dr. [name]    ----- Message ----- From: Sherilyn Dacosta, RMA Sent: 07/16/2016  11:43 AM To: Marchia Meiers Subject: Change provider                                Patient stated that the cardiologist the she was referred to is not in network for her can it be changed to Dr. [name].

## 2016-07-17 NOTE — Telephone Encounter (Signed)
Left message for patient to return call to the office to verify that patient wanted to see Dr. [name] as her cardiologist.

## 2016-07-18 DIAGNOSIS — M940 Chondrocostal junction syndrome [Tietze]: Secondary | ICD-10-CM | POA: Diagnosis not present

## 2016-07-18 DIAGNOSIS — R079 Chest pain, unspecified: Secondary | ICD-10-CM | POA: Diagnosis not present

## 2016-07-18 DIAGNOSIS — K219 Gastro-esophageal reflux disease without esophagitis: Secondary | ICD-10-CM | POA: Diagnosis not present

## 2016-07-18 NOTE — Telephone Encounter (Signed)
Spoke with patient she expressed understanding.

## 2016-07-24 ENCOUNTER — Encounter: Payer: Self-pay | Admitting: Cardiology

## 2016-07-24 ENCOUNTER — Telehealth: Payer: Self-pay | Admitting: Internal Medicine

## 2016-07-24 NOTE — Telephone Encounter (Signed)
Patient called wanting EKG that was performed in office, faxed over to her cardiologist, Dr. [name] for her 9 AM appointment tomorrow (07/25/2016).  EKG was sent to scanning and has not been scanned in yet. Unable to fax over to Dr. [name].   Patient has been informed.

## 2016-07-25 ENCOUNTER — Ambulatory Visit (INDEPENDENT_AMBULATORY_CARE_PROVIDER_SITE_OTHER): Payer: Medicare HMO | Admitting: Cardiology

## 2016-07-25 VITALS — BP 132/74 | HR 76 | Ht 66.0 in | Wt 151.0 lb

## 2016-07-25 DIAGNOSIS — E7801 Familial hypercholesterolemia: Secondary | ICD-10-CM | POA: Diagnosis not present

## 2016-07-25 DIAGNOSIS — R002 Palpitations: Secondary | ICD-10-CM

## 2016-07-25 DIAGNOSIS — I83893 Varicose veins of bilateral lower extremities with other complications: Secondary | ICD-10-CM | POA: Diagnosis not present

## 2016-07-25 DIAGNOSIS — M940 Chondrocostal junction syndrome [Tietze]: Secondary | ICD-10-CM | POA: Diagnosis not present

## 2016-07-25 DIAGNOSIS — I10 Essential (primary) hypertension: Secondary | ICD-10-CM | POA: Diagnosis not present

## 2016-07-25 DIAGNOSIS — E78019 Familial hypercholesterolemia, unspecified: Secondary | ICD-10-CM

## 2016-07-25 MED ORDER — COLCRYS 0.6 MG PO TABS
0.6000 mg | ORAL_TABLET | Freq: Two times a day (BID) | ORAL | 0 refills | Status: DC
Start: 2016-07-25 — End: 2017-01-23

## 2016-07-25 NOTE — Patient Instructions (Signed)
Medication Instructions:   START TAKING COLCRYS 0.6 MG TWICE DAILY--TAKE THIS WITH FOOD---TAKE FOR 3 MONTHS ONLY     You have been referred to VASCULAR SURGERY WITH DR Trula Slade FOR VARICOSE VEIN OF LOWER EXTREMITIES WITH COMPLICATIONS     Follow-Up:  4 WEEK WITH DR NELSON---OK TO ADD TO QUARTER DAY END SLOT       If you need a refill on your cardiac medications before your next appointment, please call your pharmacy.

## 2016-07-25 NOTE — Progress Notes (Signed)
Cardiology Office Note    Date:  07/25/2016   ID:  Sierra Mitro V., DOB 1946/01/18, MRN [id number]  PCP:  Gildardo Cranker, DO  Cardiologist:  Ena Dawley, MD   No chief complaint on file.  History of Present Illness:  Sierra Anderegg V. is a 71 y.o. female was seen by me in October 2015 for palpitations and worsening dyspnea on exertion. Her EKG was completely normal and she underwent pharmacologic nuclear stress testing that showed normal EF and no ischemia or prior infarct. She is coming with concern of left and right-sided chest pain that is reproducible sharp started about 3 weeks ago and can last up to couple days. No association with food, exertion or special position. She denies any dyspnea on exertion, no recurrent palpitations no dizziness or syncope. No lower extremity edema. She is complaining of worsening varicose veins that are painful especially on her right leg which regular burning. She has been treated for HTN, HLP and DM2 for the last 20 years. HbA1c 7.7 %. Her father had CABG in his early 11'. She has never smoked.   Past Medical History:  Diagnosis Date  . Abnormal Pap smear 05/08/2011   ASC-US +HPV  . Arthritis   . Asthma   . Diabetes mellitus type 2 with neurological manifestations ([place])   . Diabetic peripheral neuropathy ([place])   . Generalized osteoarthritis   . GERD (gastroesophageal reflux disease)   . Hx of seasonal allergies   . Hyperlipidemia   . Hypertension   . IBS (irritable bowel syndrome)   . S/P total hysterectomy 06/21/2011    Past Surgical History:  Procedure Laterality Date  . OVARIAN CYST SURGERY  1986  . TONSILECTOMY, ADENOIDECTOMY, BILATERAL MYRINGOTOMY AND TUBES  1974  . VAGINAL HYSTERECTOMY  1983    Current Medications: Outpatient Medications Prior to Visit  Medication Sig Dispense Refill  . ACCU-CHEK AVIVA PLUS test strip TEST BLOOD SUGAR ONE TIME DAILY FOR DIABETES 100 each 3  . ACCU-CHEK SOFTCLIX LANCETS lancets USE   TO TEST BLOOD SUGAR ONE TIME DAILY FOR DIABETES 100 each 3  . Alcohol Swabs (B-D SINGLE USE SWABS REGULAR) PADS As directed for diabetes one daily to test blood E11.42 100 each 11  . amLODipine (NORVASC) 5 MG tablet Take 1 tablet (5 mg total) by mouth daily. 90 tablet 3  . Blood Glucose Monitoring Suppl (ACCU-CHEK AVIVA PLUS) w/Device KIT     . cholecalciferol (VITAMIN D) 1000 UNITS tablet Take 1,000 Units by mouth daily.     . fluticasone (FLONASE) 50 MCG/ACT nasal spray Place 2 sprays into the nose daily as needed.    . gabapentin (NEURONTIN) 400 MG capsule Take 1 capsule (400 mg total) by mouth 2 (two) times daily. 180 capsule 3  . loratadine (CLARITIN) 10 MG tablet Take 10 mg by mouth daily.    . metFORMIN (GLUCOPHAGE-XR) 500 MG 24 hr tablet Take two tablets by mouth twice daily to control blood sugar 360 tablet 3  . metoprolol tartrate (LOPRESSOR) 25 MG tablet Take 0.5 tablets (12.5 mg total) by mouth 2 (two) times daily. 180 tablet 3  . Multiple Vitamins-Minerals (ICAPS AREDS FORMULA PO) Take by mouth daily.    . Multiple Vitamins-Minerals (MULTIVITAMIN WITH MINERALS) tablet Take 1 tablet by mouth daily.    . Omega-3 Fatty Acids (FISH OIL) 1000 MG CAPS Take 2 capsules by mouth daily.    Marland Kitchen OVER THE COUNTER MEDICATION 1 drop as needed. For dry eyes. Equate Brand Restore Tears  Lubricant Eye Drops    . polyethylene glycol (MIRALAX / GLYCOLAX) packet Take 17 g by mouth daily as needed. For constipation    . rosuvastatin (CRESTOR) 5 MG tablet TAKE 1 TABLET BY MOUTH DAILY FOR CHOLESTEROL 90 tablet 3   No facility-administered medications prior to visit.      Allergies:   Aspirin and Pravastatin sodium   Social History   Social History  . Marital status: Single    Spouse name: N/A  . Number of children: 4  . Years of education: N/A   Occupational History  .  Retired   Social History Main Topics  . Smoking status: Never Smoker  . Smokeless tobacco: Never Used  . Alcohol use No  .  Drug use: No  . Sexual activity: Not Currently    Birth control/ protection: Surgical   Other Topics Concern  . Not on file   Social History Narrative   Diet?    Low fat, low carb, low salt      Do you drink/eat things with caffeine?      Marital status?  single                                  What year were you married?      Do you live in a house, apartment, assisted living, condo, trailer, etc.?      Is it one or more stories?      How many persons live in your home?      Do you have any pets in your home? (please list)      Current or past profession:      Do you exercise?   yes                 Type & how often? Yoga, water aerobics 1-2x/week      Do you have a living will?      Do you have a DNR form?                                  If not, do you want to discuss one?      Do you have signed POA/HPOA for forms?           Family History:  The patient's family history includes Diabetes in her father and mother; Heart disease in her father; Pancreatic cancer in her brother and sister.   ROS:   Please see the history of present illness.    ROS All other systems reviewed and are negative.  PHYSICAL EXAM:   VS:  BP 132/74   Pulse 76   Ht '5\' 6"'  (1.676 m)   Wt 151 lb (68.5 kg)   SpO2 98%   BMI 24.37 kg/m    GEN: Well nourished, well developed, in no acute distress  HEENT: normal  Neck: no JVD, carotid bruits, or masses Cardiac: RRR; 2/6 systolic murmurs, rubs, or gallops, significant varicose veins bilaterally, good peripheral pulses.  Respiratory:  clear to auscultation bilaterally, normal work of breathing GI: soft, nontender, nondistended, + BS MS: no deformity or atrophy  Skin: warm and dry, no rash Neuro:  Alert and Oriented x 3, Strength and sensation are intact Psych: euthymic mood, full affect  Wt Readings from Last 3 Encounters:  07/25/16 151 lb (68.5 kg)  07/09/16 156 lb 3.2  oz (70.9 kg)  07/03/16 153 lb 3.2 oz (69.5 kg)    Studies/Labs  Reviewed:   EKG:  EKG is not ordered today.    Recent Labs: 07/03/2016: ALT 9; BUN 17; Creat 1.08; Hemoglobin 13.8; Platelets 187; Potassium 4.5; Sodium 142; TSH 2.45   Lipid Panel    Component Value Date/Time   CHOL 122 07/03/2016 0825   CHOL 139 09/26/2015 0837   TRIG 143 07/03/2016 0825   HDL 54 07/03/2016 0825   HDL 57 09/26/2015 0837   CHOLHDL 2.3 07/03/2016 0825   VLDL 29 07/03/2016 0825   LDLCALC 39 07/03/2016 0825   LDLCALC 55 09/26/2015 0837    Additional studies/ records that were reviewed today include:   Lexiscan nuclear stress test: 01/2014 Impression Exercise Capacity:  Lexiscan with no exercise. BP Response:  Normal blood pressure response. Clinical Symptoms:  There is dyspnea. ECG Impression:  No significant ST segment change suggestive of ischemia. Comparison with Prior Nuclear Study: No previous nuclear study performed  Overall Impression:  Normal stress nuclear study. LV Ejection Fraction: 62%.  LV Wall Motion:  NL LV Function; NL Wall Motion     ASSESSMENT:    1. Costochondritis   2. Varicose veins of both lower extremities with complications      PLAN:  In order of problems listed above:  1. Costochondritis - she has known gastritis and, take NSAIDs, we'll try colchicine 0.6 mg by mouth twice a day 2. Painful varicose veins - we will refer to vascular surgery. 3. Palpitations - resolved 4. DOE - multiple risk factors including FH of CAD, DM, HTN, HLP, Lexiscan nuclear stress test was negative in 2015. 5. Hypertension- controlled 6. Hyperlipidemia - followed by PCP, on rosuvastatin   Medication Adjustments/Labs and Tests Ordered: Current medicines are reviewed at length with the patient today.  Concerns regarding medicines are outlined above.  Medication changes, Labs and Tests ordered today are listed in the Patient Instructions below. There are no Patient Instructions on file for this visit.   Signed, Ena Dawley, MD  07/25/2016  9:26 AM    [address], [place], [place]  [address] Phone: [phone]; Fax: [phone]

## 2016-07-26 ENCOUNTER — Ambulatory Visit: Payer: Commercial Managed Care - HMO | Admitting: Student

## 2016-07-26 ENCOUNTER — Other Ambulatory Visit: Payer: Self-pay

## 2016-07-26 DIAGNOSIS — I83813 Varicose veins of bilateral lower extremities with pain: Secondary | ICD-10-CM

## 2016-08-03 ENCOUNTER — Telehealth: Payer: Self-pay

## 2016-08-03 NOTE — Telephone Encounter (Signed)
Patient called to request an interpretation of her EKG completed at physical on 07/09/16. Patient states she was never informed.   I reviewed office note and interpretation was as follows: EKG 12-Lead completed, NSR  Please clarify meaning of NSR

## 2016-08-03 NOTE — Telephone Encounter (Signed)
It was normal but due to her symptoms including shortness of breath she was referred to cardiology

## 2016-08-03 NOTE — Telephone Encounter (Signed)
Patient aware EKG Normal

## 2016-08-07 ENCOUNTER — Encounter: Payer: Self-pay | Admitting: Cardiology

## 2016-08-07 DIAGNOSIS — H2513 Age-related nuclear cataract, bilateral: Secondary | ICD-10-CM | POA: Diagnosis not present

## 2016-08-07 DIAGNOSIS — E119 Type 2 diabetes mellitus without complications: Secondary | ICD-10-CM | POA: Diagnosis not present

## 2016-08-08 ENCOUNTER — Encounter: Payer: Self-pay | Admitting: Surgery

## 2016-08-13 ENCOUNTER — Ambulatory Visit: Payer: Medicare HMO

## 2016-08-15 DIAGNOSIS — R1013 Epigastric pain: Secondary | ICD-10-CM | POA: Diagnosis not present

## 2016-08-15 DIAGNOSIS — K219 Gastro-esophageal reflux disease without esophagitis: Secondary | ICD-10-CM | POA: Diagnosis not present

## 2016-08-16 ENCOUNTER — Ambulatory Visit (HOSPITAL_COMMUNITY)
Admission: RE | Admit: 2016-08-16 | Discharge: 2016-08-16 | Disposition: A | Discharge status: Home / Self Care | Payer: Medicare HMO | Source: Ambulatory Visit | Attending: Vascular Surgery | Admitting: Vascular Surgery

## 2016-08-16 DIAGNOSIS — I83813 Varicose veins of bilateral lower extremities with pain: Secondary | ICD-10-CM | POA: Insufficient documentation

## 2016-08-16 DIAGNOSIS — I872 Venous insufficiency (chronic) (peripheral): Secondary | ICD-10-CM | POA: Diagnosis not present

## 2016-08-20 ENCOUNTER — Ambulatory Visit (INDEPENDENT_AMBULATORY_CARE_PROVIDER_SITE_OTHER): Payer: Medicare HMO | Admitting: Surgery

## 2016-08-20 ENCOUNTER — Encounter: Payer: Self-pay | Admitting: Surgery

## 2016-08-20 VITALS — BP 107/61 | HR 69 | Temp 97.1°F | Resp 18 | Ht 66.0 in | Wt 156.3 lb

## 2016-08-20 DIAGNOSIS — I83899 Varicose veins of unspecified lower extremities with other complications: Secondary | ICD-10-CM | POA: Diagnosis not present

## 2016-08-20 NOTE — Progress Notes (Signed)
[facility]  Patient name: Sierra Ray MRN: [id number] DOB: 03/31/1946 Sex: female   REFERRING PROVIDER:    Dr. [name]   REASON FOR CONSULT:    Varicose veins, painful  HISTORY OF PRESENT ILLNESS:   Sierra Mackiewicz V. is a 71 y.o. female, who is Referred for evaluation of painful varicose veins, right greater than left.She states this has been going on for many many years, since the birth of her children.  She states that she gets pain in both calves when walking, and this can be somewhat disabling.  She has not worn claudication.  She does notice a darkening of the skin on her right leg.  The patient has a history of palpitations and dyspnea on exertion.  Her cardiac workup has been unremarkable.  She suffers from type 2 diabetes for greater than 20 years.  Her most recent hemoglobin A1c is 7.7.  She takes a statin for hypercholesterolemia.  She is medically managed for hypertension.  She is a nonsmoker.   PAST MEDICAL HISTORY    Past Medical History:  Diagnosis Date  . Abnormal Pap smear 05/08/2011   ASC-US +HPV  . Arthritis   . Asthma   . Diabetes mellitus type 2 with neurological manifestations ([place])   . Diabetic peripheral neuropathy ([place])   . Generalized osteoarthritis   . GERD (gastroesophageal reflux disease)   . Hx of seasonal allergies   . Hyperlipidemia   . Hypertension   . IBS (irritable bowel syndrome)   . S/P total hysterectomy 06/21/2011     FAMILY HISTORY   Family History  Problem Relation Age of Onset  . Diabetes Father   . Heart disease Father   . Diabetes Mother   . Pancreatic cancer Brother   . Pancreatic cancer Sister     SOCIAL HISTORY:   Social History   Social History  . Marital status: Single    Spouse name: N/A  . Number of children: 4  . Years of education: N/A   Occupational History  .  Retired   Social History Main Topics  . Smoking status:  Never Smoker  . Smokeless tobacco: Never Used  . Alcohol use No  . Drug use: No  . Sexual activity: Not Currently    Birth control/ protection: Surgical   Other Topics Concern  . Not on file   Social History Narrative   Diet?    Low fat, low carb, low salt      Do you drink/eat things with caffeine?      Marital status?  single                                  What year were you married?      Do you live in a house, apartment, assisted living, condo, trailer, etc.?      Is it one or more stories?      How many persons live in your home?      Do you have any pets in your home? (please list)      Current or past profession:      Do you exercise?   yes                 Type & how often? Yoga, water aerobics 1-2x/week      Do you have a living will?      Do you  have a DNR form?                                  If not, do you want to discuss one?      Do you have signed POA/HPOA for forms?           ALLERGIES:    Allergies  Allergen Reactions  . Aspirin   . Pravastatin Sodium Other (See Comments)    Muscle aches    CURRENT MEDICATIONS:    Current Outpatient Prescriptions  Medication Sig Dispense Refill  . ACCU-CHEK AVIVA PLUS test strip TEST BLOOD SUGAR ONE TIME DAILY FOR DIABETES 100 each 3  . ACCU-CHEK SOFTCLIX LANCETS lancets USE  TO TEST BLOOD SUGAR ONE TIME DAILY FOR DIABETES 100 each 3  . Alcohol Swabs (B-D SINGLE USE SWABS REGULAR) PADS As directed for diabetes one daily to test blood E11.42 100 each 11  . amLODipine (NORVASC) 5 MG tablet Take 1 tablet (5 mg total) by mouth daily. 90 tablet 3  . Blood Glucose Monitoring Suppl (ACCU-CHEK AVIVA PLUS) w/Device KIT     . cholecalciferol (VITAMIN D) 1000 UNITS tablet Take 1,000 Units by mouth daily.     Marland Kitchen COLCRYS 0.6 MG tablet Take 1 tablet (0.6 mg total) by mouth 2 (two) times daily. 180 tablet 0  . fluticasone (FLONASE) 50 MCG/ACT nasal spray Place 2 sprays into the nose daily as needed.    . gabapentin  (NEURONTIN) 400 MG capsule Take 1 capsule (400 mg total) by mouth 2 (two) times daily. 180 capsule 3  . loratadine (CLARITIN) 10 MG tablet Take 10 mg by mouth daily.    . metFORMIN (GLUCOPHAGE-XR) 500 MG 24 hr tablet Take two tablets by mouth twice daily to control blood sugar 360 tablet 3  . metoprolol tartrate (LOPRESSOR) 25 MG tablet Take 0.5 tablets (12.5 mg total) by mouth 2 (two) times daily. 180 tablet 3  . Multiple Vitamins-Minerals (ICAPS AREDS FORMULA PO) Take by mouth daily.    . Multiple Vitamins-Minerals (MULTIVITAMIN WITH MINERALS) tablet Take 1 tablet by mouth daily.    . Omega-3 Fatty Acids (FISH OIL) 1000 MG CAPS Take 2 capsules by mouth daily.    Marland Kitchen OVER THE COUNTER MEDICATION 1 drop as needed. For dry eyes. Equate Brand Restore Tears Lubricant Eye Drops    . polyethylene glycol (MIRALAX / GLYCOLAX) packet Take 17 g by mouth daily as needed. For constipation    . rosuvastatin (CRESTOR) 5 MG tablet TAKE 1 TABLET BY MOUTH DAILY FOR CHOLESTEROL 90 tablet 3   No current facility-administered medications for this visit.     REVIEW OF SYSTEMS:   '[X]'$  denotes positive finding, '[ ]'$  denotes negative finding Cardiac  Comments:  Chest pain or chest pressure: x   Shortness of breath upon exertion: x   Short of breath when lying flat: x   Irregular heart rhythm: x       Vascular    Pain in calf, thigh, or hip brought on by ambulation: x   Pain in feet at night that wakes you up from your sleep:  x   Blood clot in your veins:    Leg swelling:         Pulmonary    Oxygen at home:    Productive cough:     Wheezing:         Neurologic    Sudden weakness in arms  or legs:  x   Sudden numbness in arms or legs:  x   Sudden onset of difficulty speaking or slurred speech: x   Temporary loss of vision in one eye:     Problems with dizziness:         Gastrointestinal    Blood in stool:      Vomited blood:         Genitourinary    Burning when urinating:     Blood in urine:         Psychiatric    Major depression:         Hematologic    Bleeding problems:    Problems with blood clotting too easily:        Skin    Rashes or ulcers:        Constitutional    Fever or chills:     PHYSICAL EXAM:   There were no vitals filed for this visit.  GENERAL: The patient is a well-nourished female, in no acute distress. The vital signs are documented above. CARDIAC: There is a regular rate and rhythm.  VASCULAR: Palpable pedal pulses.  No carotid bruits Trace edema bilaterally in the lower extremity.  Hyperpigmentation, do Matta sclerosis in the right medial ankle.  Prominent varicosity on the anterior right leg. PULMONARY: Nonlabored respirations MUSCULOSKELETAL: There are no major deformities or cyanosis. NEUROLOGIC: No focal weakness or paresthesias are detected. SKIN: There are no ulcers or rashes noted. PSYCHIATRIC: The patient has a normal affect.  STUDIES:   I have reviewed her venous reflux evaluation.  She has reflux in the right common femoral, superficial femoral vein.  She has reflux in the right great saphenous vein with maximum diameter of 0.66 cm   There is reflux in the left common femoral and superficial femoral vein.  ASSESSMENT and PLAN   CEAP Class 4:  I suspect a lot of the patient's symptoms with leg cramping is venous claudication.  I do not think it is arterial claudication as she has palpable pedal pulses.  By duplex and clinical exam, the patient's right leg is worse than the left.  She is a very prominent varicosity going down her right leg which can become very painful.  I think she would be a good candidate for laser ablation of the right saphenous vein as well as stab phlebectomy of her prominent varicosity on the right.  I want the patient to wear 20-30 thigh-high compression stockings to see how much of her leg pain results.  She is going to follow-up in 3 months.  The patient is worried about carotid stenosis.  She does not have carotid  bruits.  We have discussed participating in a screening study to get this evaluated.  She could potentially get the test performed in office if she were precertified.   Annamarie Major, MD [facility] [phone] Pager [phone]

## 2016-08-28 ENCOUNTER — Ambulatory Visit: Payer: Medicare HMO | Admitting: Cardiology

## 2016-09-03 DIAGNOSIS — Z8742 Personal history of other diseases of the female genital tract: Secondary | ICD-10-CM | POA: Diagnosis not present

## 2016-09-03 DIAGNOSIS — Z9189 Other specified personal risk factors, not elsewhere classified: Secondary | ICD-10-CM | POA: Diagnosis not present

## 2016-09-03 DIAGNOSIS — R1031 Right lower quadrant pain: Secondary | ICD-10-CM | POA: Diagnosis not present

## 2016-09-12 DIAGNOSIS — R1013 Epigastric pain: Secondary | ICD-10-CM | POA: Diagnosis not present

## 2016-09-12 DIAGNOSIS — K219 Gastro-esophageal reflux disease without esophagitis: Secondary | ICD-10-CM | POA: Diagnosis not present

## 2016-09-27 ENCOUNTER — Telehealth: Payer: Self-pay | Admitting: *Deleted

## 2016-09-27 ENCOUNTER — Telehealth: Payer: Self-pay | Admitting: Cardiology

## 2016-09-27 NOTE — Telephone Encounter (Signed)
Patient called and stated that she has been having palpitations for 3 weeks. Stated that she has had some SOB. 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 Instructed patient to call Cardiologist for an appointment. No chest Pain and No Dizziness and No arm/back pain. Patient agreed to call her Cardiologist for an appointment.

## 2016-09-27 NOTE — Telephone Encounter (Signed)
Patient called and stated that she was having Palpitations and wanted to be seen. Patient is established with a Cardiologist, Dr. [name]. She is going to call their office for an appointment. Agreed.

## 2016-09-27 NOTE — Telephone Encounter (Signed)
Patient calling complaining of palpitations for the past 3 weeks. She states that she is SOB on exertion. She denies any chest pain, lightheadedness, dizziness, or any other symptoms. Patient's BP is 128/71 HR 74. Patient requesting appointment. Appointment made for 6/15 at 1:30 PM with Cecilie Kicks, NP.

## 2016-09-27 NOTE — Telephone Encounter (Signed)
Patient c/o Palpitations:  High priority if patient c/o lightheadedness and shortness of breath.  1. How long have you been having palpitations? Couple of weeks   2. Are you currently experiencing lightheadedness and shortness of breath? SOB  3. Have you checked your BP and heart rate? (document readings)  4. Are you experiencing any other symptoms?  Chest heaviness

## 2016-09-28 ENCOUNTER — Ambulatory Visit: Payer: Medicare HMO | Admitting: Cardiology

## 2016-12-31 ENCOUNTER — Ambulatory Visit: Payer: Medicare HMO | Admitting: Vascular Surgery

## 2017-01-09 ENCOUNTER — Ambulatory Visit: Payer: Medicare HMO | Admitting: Internal Medicine

## 2017-01-23 ENCOUNTER — Ambulatory Visit (INDEPENDENT_AMBULATORY_CARE_PROVIDER_SITE_OTHER): Payer: Medicare HMO | Admitting: Internal Medicine

## 2017-01-23 ENCOUNTER — Encounter: Payer: Self-pay | Admitting: Internal Medicine

## 2017-01-23 ENCOUNTER — Ambulatory Visit
Admission: RE | Admit: 2017-01-23 | Discharge: 2017-01-23 | Disposition: A | Discharge status: Home / Self Care | Payer: Medicare HMO | Source: Ambulatory Visit | Attending: Internal Medicine | Admitting: Internal Medicine

## 2017-01-23 VITALS — BP 112/70 | HR 74 | Temp 98.2°F | Ht 66.0 in | Wt 153.0 lb

## 2017-01-23 DIAGNOSIS — M542 Cervicalgia: Secondary | ICD-10-CM | POA: Diagnosis not present

## 2017-01-23 DIAGNOSIS — J301 Allergic rhinitis due to pollen: Secondary | ICD-10-CM

## 2017-01-23 DIAGNOSIS — E785 Hyperlipidemia, unspecified: Secondary | ICD-10-CM | POA: Diagnosis not present

## 2017-01-23 DIAGNOSIS — R002 Palpitations: Secondary | ICD-10-CM | POA: Diagnosis not present

## 2017-01-23 DIAGNOSIS — I10 Essential (primary) hypertension: Secondary | ICD-10-CM

## 2017-01-23 DIAGNOSIS — F3289 Other specified depressive episodes: Secondary | ICD-10-CM

## 2017-01-23 DIAGNOSIS — K219 Gastro-esophageal reflux disease without esophagitis: Secondary | ICD-10-CM

## 2017-01-23 DIAGNOSIS — Z23 Encounter for immunization: Secondary | ICD-10-CM | POA: Diagnosis not present

## 2017-01-23 DIAGNOSIS — R413 Other amnesia: Secondary | ICD-10-CM

## 2017-01-23 DIAGNOSIS — F32A Depression, unspecified: Secondary | ICD-10-CM | POA: Insufficient documentation

## 2017-01-23 DIAGNOSIS — F329 Major depressive disorder, single episode, unspecified: Secondary | ICD-10-CM | POA: Insufficient documentation

## 2017-01-23 DIAGNOSIS — E1142 Type 2 diabetes mellitus with diabetic polyneuropathy: Secondary | ICD-10-CM | POA: Diagnosis not present

## 2017-01-23 NOTE — Progress Notes (Signed)
Patient ID: Sierra Ray V., female   DOB: [birth year], 71 y.o.   MRN: [id number]   Location:      Place of Service:    Provider:   Patient Care Team: Gildardo Cranker, DO as PCP - General (Internal Medicine) Dorothy Spark, MD as Consulting Physician (Cardiology)  Extended Emergency Contact Information Primary Emergency Contact: [name] Address: [address]          [place], [place] [address] [name] of [place] Phone: [phone] Mobile Phone: [phone] Relation: Daughter Secondary Emergency Contact: [name],[name] Address: [place], [place] Phone: [phone] Relation: Son  Code Status:  Goals of Care: Advanced Directive information Advanced Directives 08/20/2016  Does Patient Have a Medical Advance Directive? Yes  Type of Advance Directive Living will;Healthcare Power of Attorney  Does patient want to make changes to medical advance directive? -  Copy of [name] in Chart? No - copy requested     Chief Complaint  Patient presents with  . Medical Management of Chronic Issues    6 month follow-up, DM foot exam due, depression follow-up recommended per quality metric gap, neck pain (off/on), latest onset 1 week ago. Patient also c/o congestion (no discoloration), denies fever.   . Immunizations    Discuss is patient can get flu vaccine today, patient with congestion/cold  . Medication Refill    No refills needed   . Health Maintenance    Last eye  exam 07/2016 (will call for results)    HPI: Patient is a 71 y.o. female seen in today for an annual wellness exam. Today she complains of neck pain that has started in the last month. She describes the pain as sharp and shooting from lower neck to head. The pain has started giving her a headache in the back of her head. She said the pain comes a different times regardless of activity. She does not like to take tylenol or ibuprofen. She says  the pain is about an 8/10. She wants to know if it is related to arthritis.  HTN - BP controlled on amlodipine and metoprolol.  Chronic pain - thoracic, lower back and b/l LE. She has had EMG/NCS in the past. She has never seen a neurologist. Has seen rheum in the past but that provider ([name]) is no longer in the area. Takes diclofenac prn hut has not used it lately. Pain in her knees very severe. She does take aleve or tylenol. She does not want to take more medications.   DM - takes metformin. A1c 6.2%. She has neuropathy and takes gabapentin. Numbness in feet affects ability to ambulate safely.  Hyperlipidemia - stable on crestor. LDL 39. Lipitor, zocor, pravachol caused myalgias. She does have some leg cramps but tolerable  Asthma/seasonal allergy - seasonal wheezing. Takes flonase. She stopped claritin due to c/a dryness of throat. She now has 3-4 day hx ear pressure and popping, post nasal drip.no f/c. No sore throat  She saw eye specialist [name]. She cancelled her cataract sx due to fear of having it and no transportation  Depression screen [facility] 2/9 01/23/2017 07/09/2016 07/03/2016 08/25/2015 07/01/2015  Decreased Interest 0 '2 2 1 ' 0  Down, Depressed, Hopeless 0 '2 1 1 ' 0  PHQ - 2 Score 0 '4 3 2 ' 0  Altered sleeping - 3 3 - 0  Tired, decreased energy - 3 1 - 0  Change  in appetite - 3 3 - 0  Feeling bad or failure about yourself  - 0 0 - 0  Trouble concentrating - 1 0 - 0  Moving slowly or fidgety/restless - 1 0 - 0  Suicidal thoughts - 0 0 - 0  PHQ-9 Score - 15 10 - 0  Difficult doing work/chores - Somewhat difficult Somewhat difficult - -    Fall Risk  01/23/2017 07/09/2016 07/03/2016 02/03/2016 09/28/2015  Falls in the past year? No No No No No   MMSE - Mini Mental State Exam 07/03/2016 07/01/2015  Not completed: - (No Data)  Orientation to time 5 5  Orientation to Place 5 5  Registration 3 3  Attention/ Calculation 5 5  Recall 2 3  Language- name 2 objects 2 2    Language- repeat 1 1  Language- follow 3 step command 3 3  Language- read & follow direction 1 1  Write a sentence 1 1  Copy design 1 1  Total score 29 30     Health Maintenance  Topic Date Due  . FOOT EXAM  06/30/2016  . INFLUENZA VACCINE  11/14/2016  . HEMOGLOBIN A1C  01/03/2017  . OPHTHALMOLOGY EXAM  06/26/2017 (Originally 04/23/1955)  . MAMMOGRAM  04/16/2018 (Originally 08/09/2016)  . Hepatitis C Screening  04/16/2018 (Originally 1946-03-12)  . URINE MICROALBUMIN  07/03/2017  . TETANUS/TDAP  08/17/2020  . COLONOSCOPY  09/05/2025  . DEXA SCAN  Completed  . PNA vac Low Risk Adult  Completed    Urinary incontinence? Functional Status Survey:   Exercise? Diet? No exam data present Hearing:   Dentition: Pain:  Past Medical History:  Diagnosis Date  . Abnormal Pap smear 05/08/2011   ASC-US +HPV  . Arthritis   . Asthma   . Diabetes mellitus type 2 with neurological manifestations ([place])   . Diabetic peripheral neuropathy ([place])   . Generalized osteoarthritis   . GERD (gastroesophageal reflux disease)   . Hx of seasonal allergies   . Hyperlipidemia   . Hypertension   . IBS (irritable bowel syndrome)   . S/P total hysterectomy 06/21/2011    Past Surgical History:  Procedure Laterality Date  . OVARIAN CYST SURGERY  1986  . TONSILECTOMY, ADENOIDECTOMY, BILATERAL MYRINGOTOMY AND TUBES  1974  . VAGINAL HYSTERECTOMY  1983    Family History  Problem Relation Age of Onset  . Diabetes Father   . Heart disease Father   . Diabetes Mother   . Pancreatic cancer Brother   . Pancreatic cancer Sister     Social History   Social History  . Marital status: Single    Spouse name: N/A  . Number of children: 4  . Years of education: N/A   Occupational History  .  Retired   Social History Main Topics  . Smoking status: Never Smoker  . Smokeless tobacco: Never Used  . Alcohol use No  . Drug use: No  . Sexual activity: Not Currently    Birth control/ protection:  Surgical   Other Topics Concern  . None   Social History Narrative   Diet?    Low fat, low carb, low salt      Do you drink/eat things with caffeine?      Marital status?  single                                  What year were you married?  Do you live in a house, apartment, assisted living, condo, trailer, etc.?      Is it one or more stories?      How many persons live in your home?      Do you have any pets in your home? (please list)      Current or past profession:      Do you exercise?   yes                 Type & how often? Yoga, water aerobics 1-2x/week      Do you have a living will?      Do you have a DNR form?                                  If not, do you want to discuss one?      Do you have signed POA/HPOA for forms?           reports that she has never smoked. She has never used smokeless tobacco. She reports that she does not drink alcohol or use drugs.   Allergies  Allergen Reactions  . Aspirin   . Pravastatin Sodium Other (See Comments)    Muscle aches    Outpatient Encounter Prescriptions as of 01/23/2017  Medication Sig  . ACCU-CHEK AVIVA PLUS test strip TEST BLOOD SUGAR ONE TIME DAILY FOR DIABETES  . ACCU-CHEK SOFTCLIX LANCETS lancets USE  TO TEST BLOOD SUGAR ONE TIME DAILY FOR DIABETES  . Alcohol Swabs (B-D SINGLE USE SWABS REGULAR) PADS As directed for diabetes one daily to test blood E11.42  . amLODipine (NORVASC) 5 MG tablet Take 1 tablet (5 mg total) by mouth daily.  . Blood Glucose Monitoring Suppl (ACCU-CHEK AVIVA PLUS) w/Device KIT   . cholecalciferol (VITAMIN D) 1000 UNITS tablet Take 1,000 Units by mouth daily.   Marland Kitchen dexlansoprazole (DEXILANT) 60 MG capsule Take 60 mg by mouth daily.  . fluticasone (FLONASE) 50 MCG/ACT nasal spray Place 2 sprays into the nose daily as needed.  . gabapentin (NEURONTIN) 400 MG capsule Take 1 capsule (400 mg total) by mouth 2 (two) times daily.  Marland Kitchen loratadine (CLARITIN) 10 MG tablet Take 10 mg by  mouth daily.  . metFORMIN (GLUCOPHAGE-XR) 500 MG 24 hr tablet Take two tablets by mouth twice daily to control blood sugar  . metoprolol tartrate (LOPRESSOR) 25 MG tablet Take 0.5 tablets (12.5 mg total) by mouth 2 (two) times daily.  . Multiple Vitamins-Minerals (ICAPS AREDS FORMULA PO) Take by mouth daily.  . Multiple Vitamins-Minerals (MULTIVITAMIN WITH MINERALS) tablet Take 1 tablet by mouth daily.  . Omega-3 Fatty Acids (FISH OIL) 1000 MG CAPS Take 2 capsules by mouth daily.  Marland Kitchen OVER THE COUNTER MEDICATION 1 drop as needed. For dry eyes. Equate Brand Restore Tears Lubricant Eye Drops  . polyethylene glycol (MIRALAX / GLYCOLAX) packet Take 17 g by mouth daily as needed. For constipation  . rosuvastatin (CRESTOR) 5 MG tablet TAKE 1 TABLET BY MOUTH DAILY FOR CHOLESTEROL  . [DISCONTINUED] COLCRYS 0.6 MG tablet Take 1 tablet (0.6 mg total) by mouth 2 (two) times daily. (Patient not taking: Reported on 08/20/2016)   No facility-administered encounter medications on file as of 01/23/2017.      Review of Systems:  Review of Systems  Constitutional: Negative for activity change, appetite change, chills, fatigue, fever and unexpected weight change.  HENT: Positive for postnasal drip and sneezing.  Negative for congestion, dental problem, ear pain, facial swelling, hearing loss, mouth sores, nosebleeds, rhinorrhea, sinus pain, sinus pressure, sore throat and tinnitus.   Eyes: Negative for pain, discharge and itching.  Respiratory: Negative for cough, chest tightness, shortness of breath and wheezing.   Cardiovascular: Negative for chest pain, palpitations and leg swelling.  Gastrointestinal: Positive for constipation (Last bowel movement yesterday.). Negative for abdominal distention, abdominal pain, anal bleeding, diarrhea, nausea and vomiting.  Endocrine: Negative for cold intolerance and heat intolerance.  Genitourinary: Negative for decreased urine volume, difficulty urinating, dysuria, flank pain,  frequency, hematuria and urgency.  Musculoskeletal: Positive for neck pain. Negative for arthralgias (Neck pain), back pain, gait problem, joint swelling, myalgias and neck stiffness.  Skin: Negative for color change.  Neurological: Positive for headaches. Negative for dizziness, tremors, syncope, speech difficulty, weakness, light-headedness and numbness.  Hematological: Negative for adenopathy.  Psychiatric/Behavioral: Negative for agitation, behavioral problems, confusion, self-injury, sleep disturbance and suicidal ideas. The patient is not nervous/anxious.     Physical Exam: Vitals:   01/23/17 0837  BP: 112/70  Pulse: 74  Temp: 98.2 F (36.8 C)  TempSrc: Oral  SpO2: 97%  Weight: 153 lb (69.4 kg)  Height: '5\' 6"'  (1.676 m)   Body mass index is 24.69 kg/m. Physical Exam  Constitutional: She is oriented to person, place, and time. She appears well-developed and well-nourished. No distress.  HENT:  Head: Normocephalic and atraumatic.  Right Ear: External ear normal.  Left Ear: External ear normal.  Nose: Nose normal.  Mouth/Throat: Oropharynx is clear and moist. No oropharyngeal exudate.  Eyes: Pupils are equal, round, and reactive to light. Conjunctivae and EOM are normal. Right eye exhibits no discharge. Left eye exhibits no discharge. No scleral icterus.  Neck: Normal range of motion. Neck supple. No JVD present. Muscular tenderness present. No tracheal deviation present. No thyromegaly present.    Cardiovascular: Normal rate, regular rhythm, normal heart sounds and intact distal pulses.  Exam reveals no gallop and no friction rub.   No murmur heard. Pulmonary/Chest: Effort normal and breath sounds normal. No stridor. No respiratory distress. She has no wheezes. She exhibits no tenderness.  Abdominal: Soft. Bowel sounds are normal. She exhibits no distension and no mass. There is tenderness in the left lower quadrant. There is no rebound and no guarding.    Tenderness to  left lower quadrant upon palpation.   Musculoskeletal: Normal range of motion. She exhibits no edema, tenderness or deformity.  Lymphadenopathy:    She has no cervical adenopathy.  Neurological: She is alert and oriented to person, place, and time. No cranial nerve deficit. Coordination normal.  Skin: Skin is warm and dry. She is not diaphoretic.  Psychiatric: She has a normal mood and affect. Her behavior is normal. Judgment and thought content normal.    Labs reviewed: Basic Metabolic Panel:  Recent Labs  02/01/16 0804 07/03/16 0825  NA 143 142  K 4.3 4.5  CL 106 106  CO2 28 27  GLUCOSE 108* 108*  BUN 15 17  CREATININE 0.83 1.08*  CALCIUM 9.0 9.2  TSH  --  2.45   Liver Function Tests:  Recent Labs  02/01/16 0804 07/03/16 0825  AST  --  13  ALT 11 9  ALKPHOS  --  50  BILITOT  --  0.3  PROT  --  6.7  ALBUMIN  --  4.1   No results for input(s): LIPASE, AMYLASE in the last 8760 hours. No results for input(s): AMMONIA in the  last 8760 hours. CBC:  Recent Labs  07/03/16 0825  WBC 5.2  NEUTROABS 1,872  HGB 13.8  HCT 41.5  MCV 85.0  PLT 187   Lipid Panel:  Recent Labs  02/01/16 0804 07/03/16 0825  CHOL 168 122  HDL 55 54  LDLCALC 80 39  TRIG 164* 143  CHOLHDL 3.1 2.3   Lab Results  Component Value Date   HGBA1C 6.2 (H) 07/03/2016    Procedures: No results found.  Assessment/Plan There are no diagnoses linked to this encounter.  Labs today Lipid, A1c.    Labs/tests ordered:   Next appt:     Dmoni Fortson C. Chinwe Lope, Student AGACNP

## 2017-01-23 NOTE — Progress Notes (Signed)
Patient ID: Sierra Musick V., female   DOB: [birth year], 71 y.o.   MRN: [id number]    Location:  [facility] Place of Service: OFFICE  Chief Complaint  Patient presents with  . Medical Management of Chronic Issues    6 month follow-up, DM foot exam due, depression follow-up recommended per quality metric gap, neck pain (off/on), latest onset 1 week ago. Patient also c/o congestion (no discoloration), denies fever.   . Immunizations    Discuss is patient can get flu vaccine today, patient with congestion/cold  . Medication Refill    No refills needed   . Health Maintenance    Last eye  exam 07/2016 (will call for results)    HPI:  71 yo female seen today for f/u. She has neck pain worse with ROM. She does not take medication for pain. Aleve caused gastritis in the past.  GERD - bloating/burning resolved with prn dexilant. She has gas and indigestion. She saw GI and had EGD and colonoscopy by [name] in May 2017. EGD showed gastritis and was Rx omeprazole (ineffective) -->dexilant (expensive). She has been unable to afford dexilant and has been using samples. She is not taking carafate due to cost and unknown reaction. Colonoscopy revealed multiple benign polyps and diverticulosis and she was told to repeat in 5 yrs. She takes miralax prn. Needs samples of dexilant  short term memory loss -  x several yrs and has gotten worse over the last 5 yrs. She declines any meds  She feels depressed but does not want to take any meds. She been to counseling in the past. No SI/HI. She has anhedonia and increased fatigue. She has taken zoloft and wellbutrin in the past with success.  Palpitations - ECG revealed PACs at cardio office in 2015. Takes metoprolol which helps. She did have to increase dose over this past summer to 1 tab BID x 2 weeks then reduced back to nml dose.  HTN - BP controlled on amlodipine.  Chronic pain - thoracic, lower back and b/l LE. She has had EMG/NCS in the past. She has never  seen a neurologist. Has seen rheum in the past but that provider ([name]) is no longer in the area. Takes diclofenac prn hut has not used it lately. Pain in her knees very severe. She does take aleve  DM - takes metformin XR 539m 2 tabs BID. BS 80-90s at home. Rarely >100. A1c 6.2%. She has neuropathy and takes gabapentin. Numbness in feet/hands stable.  Hyperlipidemia - stable on crestor. LDL 80. Lipitor, zocor, pravachol caused myalgias. She does have some leg cramps but tolerable  Asthma/seasonal allergy - seasonal wheezing. Occasional dry cough. Takes flonase. She takes claritin qhs. She does have post nasal drip   She never followed up with GYN after her last OV  She saw eye specialist [name] She cancelled her cataract sx due to fear of having it and no transportation  She is UTD on pneumonia vaccines.    Past Medical History:  Diagnosis Date  . Abnormal Pap smear 05/08/2011   ASC-US +HPV  . Arthritis   . Asthma   . Diabetes mellitus type 2 with neurological manifestations ([place]   . [place]abetic peripheral neuropathy [place]   . Generalized osteoarthritis   . GERD (gastroesophageal reflux disease)   . Hx of seasonal allergies   . Hyperlipidemia   . Hypertension   . IBS (irritable bowel syndrome)   . S/P total hysterectomy 06/21/2011    Past Surgical  History:  Procedure Laterality Date  . OVARIAN CYST SURGERY  1986  . TONSILECTOMY, ADENOIDECTOMY, BILATERAL MYRINGOTOMY AND TUBES  1974  . VAGINAL HYSTERECTOMY  1983    Patient Care Team: Gildardo Cranker, DO as PCP - General (Internal Medicine) Dorothy Spark, MD as Consulting Physician (Cardiology)  Social History   Social History  . Marital status: Single    Spouse name: N/A  . Number of children: 4  . Years of education: N/A   Occupational History  .  Retired   Social History Main Topics  . Smoking status: Never Smoker  . Smokeless tobacco: Never Used  . Alcohol use No  . Drug use: No  . Sexual activity:  Not Currently    Birth control/ protection: Surgical   Other Topics Concern  . Not on file   Social History Narrative   Diet?    Low fat, low carb, low salt      Do you drink/eat things with caffeine?      Marital status?  single                                  What year were you married?      Do you live in a house, apartment, assisted living, condo, trailer, etc.?      Is it one or more stories?      How many persons live in your home?      Do you have any pets in your home? (please list)      Current or past profession:      Do you exercise?   yes                 Type & how often? Yoga, water aerobics 1-2x/week      Do you have a living will?      Do you have a DNR form?                                  If not, do you want to discuss one?      Do you have signed POA/HPOA for forms?            reports that she has never smoked. She has never used smokeless tobacco. She reports that she does not drink alcohol or use drugs.  Family History  Problem Relation Age of Onset  . Diabetes Father   . Heart disease Father   . Diabetes Mother   . Pancreatic cancer Brother   . Pancreatic cancer Sister    Family Status  Relation Status  . Father Deceased  . Mother Deceased  . Brother Deceased  . Sister Deceased  . Son Alive  . Daughter Alive  . Daughter Alive  . Daughter Alive  . MGM Deceased  . MGF Deceased  . PGM Deceased  . PGF Deceased     Allergies  Allergen Reactions  . Aspirin   . Pravastatin Sodium Other (See Comments)    Muscle aches    Medications: Patient's Medications  New Prescriptions   No medications on file  Previous Medications   ACCU-CHEK AVIVA PLUS TEST STRIP    TEST BLOOD SUGAR ONE TIME DAILY FOR DIABETES   ACCU-CHEK SOFTCLIX LANCETS LANCETS    USE  TO TEST BLOOD SUGAR ONE TIME DAILY FOR DIABETES   ALCOHOL SWABS (  B-D SINGLE USE SWABS REGULAR) PADS    As directed for diabetes one daily to test blood E11.42   AMLODIPINE (NORVASC) 5 MG  TABLET    Take 1 tablet (5 mg total) by mouth daily.   BLOOD GLUCOSE MONITORING SUPPL (ACCU-CHEK AVIVA PLUS) W/DEVICE KIT       CHOLECALCIFEROL (VITAMIN D) 1000 UNITS TABLET    Take 1,000 Units by mouth daily.    DEXLANSOPRAZOLE (DEXILANT) 60 MG CAPSULE    Take 60 mg by mouth daily.   FLUTICASONE (FLONASE) 50 MCG/ACT NASAL SPRAY    Place 2 sprays into the nose daily as needed.   GABAPENTIN (NEURONTIN) 400 MG CAPSULE    Take 1 capsule (400 mg total) by mouth 2 (two) times daily.   LORATADINE (CLARITIN) 10 MG TABLET    Take 10 mg by mouth daily.   METFORMIN (GLUCOPHAGE-XR) 500 MG 24 HR TABLET    Take two tablets by mouth twice daily to control blood sugar   METOPROLOL TARTRATE (LOPRESSOR) 25 MG TABLET    Take 0.5 tablets (12.5 mg total) by mouth 2 (two) times daily.   MULTIPLE VITAMINS-MINERALS (ICAPS AREDS FORMULA PO)    Take by mouth daily.   MULTIPLE VITAMINS-MINERALS (MULTIVITAMIN WITH MINERALS) TABLET    Take 1 tablet by mouth daily.   OMEGA-3 FATTY ACIDS (FISH OIL) 1000 MG CAPS    Take 2 capsules by mouth daily.   OVER THE COUNTER MEDICATION    1 drop as needed. For dry eyes. Equate Brand Restore Tears Lubricant Eye Drops   POLYETHYLENE GLYCOL (MIRALAX / GLYCOLAX) PACKET    Take 17 g by mouth daily as needed. For constipation   ROSUVASTATIN (CRESTOR) 5 MG TABLET    TAKE 1 TABLET BY MOUTH DAILY FOR CHOLESTEROL  Modified Medications   No medications on file  Discontinued Medications   COLCRYS 0.6 MG TABLET    Take 1 tablet (0.6 mg total) by mouth 2 (two) times daily.    Review of Systems  Unable to perform ROS: Other (memory loss)    Vitals:   01/23/17 0837  BP: 112/70  Pulse: 74  Temp: 98.2 F (36.8 C)  TempSrc: Oral  SpO2: 97%  Weight: 153 lb (69.4 kg)  Height: 5' 6" (1.676 m)   Body mass index is 24.69 kg/m.  Physical Exam  Constitutional: She is oriented to person, place, and time. She appears well-developed and well-nourished.  HENT:  Mouth/Throat: Oropharynx is clear  and moist. No oropharyngeal exudate.  MMM; no oral thrush  Eyes: Pupils are equal, round, and reactive to light. No scleral icterus.  Neck: Neck supple. Muscular tenderness present. Carotid bruit is not present. Decreased range of motion present. No tracheal deviation present. No thyromegaly present.  Cardiovascular: Normal rate, regular rhythm, normal heart sounds and intact distal pulses.  Exam reveals no gallop and no friction rub.   No murmur heard. No LE edema b/l. no calf TTP.   Pulmonary/Chest: Effort normal and breath sounds normal. No stridor. No respiratory distress. She has no wheezes. She has no rales.  Abdominal: Soft. Normal appearance and bowel sounds are normal. She exhibits no distension and no mass. There is no hepatomegaly. There is no tenderness. There is no rigidity, no rebound and no guarding. No hernia.  Musculoskeletal: She exhibits edema and tenderness.  Lymphadenopathy:    She has no cervical adenopathy.  Neurological: She is alert and oriented to person, place, and time. She has normal reflexes.  Skin: Skin is  warm and dry. No rash noted.  Psychiatric: She has a normal mood and affect. Her behavior is normal. Judgment and thought content normal.   Diabetic Foot Exam - Simple   Simple Foot Form Diabetic Foot exam was performed with the following findings:  Yes 01/23/2017  9:49 AM  Visual Inspection See comments:  Yes Sensation Testing Intact to touch and monofilament testing bilaterally:  Yes Pulse Check Posterior Tibialis and Dorsalis pulse intact bilaterally:  Yes Comments Plantar callus R>L but no ulcerations. No toenail dystrophy       Labs reviewed: No visits with results within 3 Month(s) from this visit.  Latest known visit with results is:  Appointment on 07/03/2016  Component Date Value Ref Range Status  . Sodium 07/03/2016 142  135 - 146 mmol/L Final  . Potassium 07/03/2016 4.5  3.5 - 5.3 mmol/L Final  . Chloride 07/03/2016 106  98 - 110  mmol/L Final  . CO2 07/03/2016 27  20 - 31 mmol/L Final  . Glucose, Bld 07/03/2016 108* 65 - 99 mg/dL Final  . BUN 07/03/2016 17  7 - 25 mg/dL Final  . Creat 07/03/2016 1.08* 0.50 - 0.99 mg/dL Final   Comment:   For patients > or = 69 years of age: The upper reference limit for Creatinine is approximately 13% higher for people identified as African-American.     . Total Bilirubin 07/03/2016 0.3  0.2 - 1.2 mg/dL Final  . Alkaline Phosphatase 07/03/2016 50  33 - 130 U/L Final  . AST 07/03/2016 13  10 - 35 U/L Final  . ALT 07/03/2016 9  6 - 29 U/L Final  . Total Protein 07/03/2016 6.7  6.1 - 8.1 g/dL Final  . Albumin 07/03/2016 4.1  3.6 - 5.1 g/dL Final  . Calcium 07/03/2016 9.2  8.6 - 10.4 mg/dL Final  . GFR, Est African American 07/03/2016 61  >=60 mL/min Final  . GFR, Est Non African American 07/03/2016 53* >=60 mL/min Final  . Cholesterol 07/03/2016 122  <200 mg/dL Final  . Triglycerides 07/03/2016 143  <150 mg/dL Final  . HDL 07/03/2016 54  >50 mg/dL Final  . Total CHOL/HDL Ratio 07/03/2016 2.3  <5.0 Ratio Final  . VLDL 07/03/2016 29  <30 mg/dL Final  . LDL Cholesterol 07/03/2016 39  <100 mg/dL Final  . WBC 07/03/2016 5.2  3.8 - 10.8 K/uL Final  . RBC 07/03/2016 4.88  3.80 - 5.10 MIL/uL Final  . Hemoglobin 07/03/2016 13.8  11.7 - 15.5 g/dL Final  . HCT 07/03/2016 41.5  35.0 - 45.0 % Final  . MCV 07/03/2016 85.0  80.0 - 100.0 fL Final  . MCH 07/03/2016 28.3  27.0 - 33.0 pg Final  . MCHC 07/03/2016 33.3  32.0 - 36.0 g/dL Final  . RDW 07/03/2016 14.4  11.0 - 15.0 % Final  . Platelets 07/03/2016 187  140 - 400 K/uL Final  . MPV 07/03/2016 9.5  7.5 - 12.5 fL Final  . Neutro Abs 07/03/2016 1872  1,500 - 7,800 cells/uL Final  . Lymphs Abs 07/03/2016 2444  850 - 3,900 cells/uL Final  . Monocytes Absolute 07/03/2016 520  200 - 950 cells/uL Final  . Eosinophils Absolute 07/03/2016 312  15 - 500 cells/uL Final  . Basophils Absolute 07/03/2016 52  0 - 200 cells/uL Final  . Neutrophils  Relative % 07/03/2016 36  % Final  . Lymphocytes Relative 07/03/2016 47  % Final  . Monocytes Relative 07/03/2016 10  % Final  . Eosinophils Relative 07/03/2016  6  % Final  . Basophils Relative 07/03/2016 1  % Final  . Smear Review 07/03/2016 Criteria for review not met   Final  . TSH 07/03/2016 2.45  mIU/L Final   Comment:   Reference Range   > or = 20 Years  0.40-4.50   Pregnancy Range First trimester  0.26-2.66 Second trimester 0.55-2.73 Third trimester  0.43-2.91     . Creatinine, Urine 07/03/2016 218  20 - 320 mg/dL Final  . Microalb, Ur 07/03/2016 2.5  Not estab mg/dL Final  . Microalb Creat Ratio 07/03/2016 11  <30 mcg/mg creat Final   Comment: The ADA has defined abnormalities in albumin excretion as follows:           Category           Result                            (mcg/mg creatinine)                 Normal:    <30       Microalbuminuria:    30 - 299   Clinical albuminuria:    > or = 300   The ADA recommends that at least two of three specimens collected within a 3 - 6 month period be abnormal before considering a patient to be within a diagnostic category.     . Hgb A1c MFr Bld 07/03/2016 6.2* <5.7 % Final   Comment:   For someone without known diabetes, a hemoglobin A1c value between 5.7% and 6.4% is consistent with prediabetes and should be confirmed with a follow-up test.   For someone with known diabetes, a value <7% indicates that their diabetes is well controlled. A1c targets should be individualized based on duration of diabetes, age, co-morbid conditions and other considerations.   This assay result is consistent with an increased risk of diabetes.   Currently, no consensus exists regarding use of hemoglobin A1c for diagnosis of diabetes in children.     . Mean Plasma Glucose 07/03/2016 131  mg/dL Final  . Color, Urine 07/03/2016 YELLOW  YELLOW Final  . APPearance 07/03/2016 CLEAR  CLEAR Final  . Specific Gravity, Urine 07/03/2016 1.016   1.001 - 1.035 Final  . pH 07/03/2016 5.5  5.0 - 8.0 Final  . Glucose, UA 07/03/2016 NEGATIVE  NEGATIVE Final  . Bilirubin Urine 07/03/2016 NEGATIVE  NEGATIVE Final  . Ketones, ur 07/03/2016 NEGATIVE  NEGATIVE Final  . Hgb urine dipstick 07/03/2016 NEGATIVE  NEGATIVE Final  . Protein, ur 07/03/2016 NEGATIVE  NEGATIVE Final  . Nitrite 07/03/2016 NEGATIVE  NEGATIVE Final  . Leukocytes, UA 07/03/2016 NEGATIVE  NEGATIVE Final    No results found.   Assessment/Plan   ICD-10-CM   1. Neck pain, bilateral M54.2 DG Cervical Spine Complete  2. Palpitations R00.2   3. Chronic seasonal allergic rhinitis due to pollen J30.1   4. Gastroesophageal reflux disease without esophagitis K21.9   5. Essential hypertension I10   6. Type 2 diabetes mellitus with diabetic polyneuropathy, without long-term current use of insulin (HCC) E11.42 BMP with eGFR    ALT    Hemoglobin A1c  7. Other depression F32.89   8. Short-term memory loss R41.3   9. Hyperlipidemia LDL goal <100 E78.5 Lipid Panel  10. Need for immunization against influenza Z23 Flu Vaccine QUAD 36+ mos IM   Influenza vaccine given today  Please remember to get your mammogram  Continue current medications as ordered  Will call with xray results  Will call with lab results  Follow up in 6 mos for AWV/CPE  Demia Viera S. Perlie Gold  [facility] [address] [place], [place] [address] [phone] Cell (Monday-Friday 8 AM - 5 PM) [phone] After 5 PM and follow prompts

## 2017-01-23 NOTE — Patient Instructions (Signed)
Influenza vaccine given today  Please remember to get your mammogram  Continue current medications as ordered  Will call with xray results  Will call with lab results  Follow up in 6 mos for AWV/CPE

## 2017-01-24 LAB — BASIC METABOLIC PANEL WITH GFR
BUN: 17 mg/dL (ref 7–25)
CO2: 31 mmol/L (ref 20–32)
Calcium: 9.6 mg/dL (ref 8.6–10.4)
Chloride: 104 mmol/L (ref 98–110)
Creat: 0.81 mg/dL (ref 0.50–0.99)
GFR, Est African American: 86 mL/min/{1.73_m2} (ref >=60)
GFR, Est Non African American: 74 mL/min/{1.73_m2} (ref >=60)
Glucose, Bld: 105 mg/dL — ABNORMAL HIGH (ref 65–99)
Potassium: 4.5 mmol/L (ref 3.5–5.3)
Sodium: 141 mmol/L (ref 135–146)

## 2017-01-24 LAB — HEMOGLOBIN A1C
Hgb A1c MFr Bld: 6.6 % of total Hgb — ABNORMAL HIGH (ref <5.7)
Mean Plasma Glucose: 143 (calc)
eAG (mmol/L): 7.9 (calc)

## 2017-01-24 LAB — LIPID PANEL
Cholesterol: 162 mg/dL (ref <200)
HDL: 67 mg/dL (ref >50)
LDL Cholesterol (Calc): 71 mg/dL (calc)
Non-HDL Cholesterol (Calc): 95 mg/dL (calc) (ref <130)
Total CHOL/HDL Ratio: 2.4 (calc) (ref <5.0)
Triglycerides: 160 mg/dL — ABNORMAL HIGH (ref <150)

## 2017-01-24 LAB — ALT: ALT: 12 U/L (ref 6–29)

## 2017-05-08 ENCOUNTER — Other Ambulatory Visit: Payer: Self-pay | Admitting: Internal Medicine

## 2017-05-22 ENCOUNTER — Other Ambulatory Visit: Payer: Self-pay | Admitting: *Deleted

## 2017-05-22 MED ORDER — ACCU-CHEK AVIVA PLUS W/DEVICE KIT: PACK | 0 refills | Status: DC

## 2017-05-22 MED ORDER — GLUCOSE BLOOD VI STRP: ORAL_STRIP | 1 refills | Status: DC

## 2017-05-22 NOTE — Telephone Encounter (Signed)
Patient requested Rx to be sent to [facility]. Patient's current blood sugar machine broke. Rx Faxed.

## 2017-06-18 ENCOUNTER — Ambulatory Visit: Payer: Self-pay | Admitting: Internal Medicine

## 2017-06-26 ENCOUNTER — Ambulatory Visit (INDEPENDENT_AMBULATORY_CARE_PROVIDER_SITE_OTHER): Payer: Medicare HMO | Admitting: Internal Medicine

## 2017-06-26 ENCOUNTER — Encounter: Payer: Self-pay | Admitting: Internal Medicine

## 2017-06-26 VITALS — BP 112/64 | HR 67 | Temp 98.0°F | Ht 66.0 in | Wt 157.0 lb

## 2017-06-26 DIAGNOSIS — E1142 Type 2 diabetes mellitus with diabetic polyneuropathy: Secondary | ICD-10-CM | POA: Diagnosis not present

## 2017-06-26 DIAGNOSIS — J301 Allergic rhinitis due to pollen: Secondary | ICD-10-CM

## 2017-06-26 DIAGNOSIS — K0889 Other specified disorders of teeth and supporting structures: Secondary | ICD-10-CM | POA: Diagnosis not present

## 2017-06-26 DIAGNOSIS — R300 Dysuria: Secondary | ICD-10-CM | POA: Diagnosis not present

## 2017-06-26 DIAGNOSIS — J029 Acute pharyngitis, unspecified: Secondary | ICD-10-CM

## 2017-06-26 DIAGNOSIS — R002 Palpitations: Secondary | ICD-10-CM | POA: Diagnosis not present

## 2017-06-26 LAB — URINALYSIS, ROUTINE W REFLEX MICROSCOPIC
Bilirubin Urine: NEGATIVE
Glucose, UA: NEGATIVE
Hgb urine dipstick: NEGATIVE
Ketones, ur: NEGATIVE
Leukocytes, UA: NEGATIVE
Nitrite: NEGATIVE
Protein, ur: NEGATIVE
Specific Gravity, Urine: 1.008 (ref 1.001–1.03)
pH: 5.5 (ref 5.0–8.0)

## 2017-06-26 LAB — POCT RAPID STREP A (OFFICE): Rapid Strep A Screen: NEGATIVE

## 2017-06-26 LAB — POCT INFLUENZA A/B
Influenza A, POC: NEGATIVE
Influenza B, POC: NEGATIVE

## 2017-06-26 NOTE — Patient Instructions (Addendum)
Strep and flu swab negative  Push fluids and rest  Recommend USE NASAL STEROID EVERY DAY THROUGH June  MAY NEED TO CHANGE CLARITIN TO ALLEGRA OR ZYRTEC  MAY NEED TO ADD SINGULAIR (MONTELUKAST) IF NO RESPONSE TO CHANGE IN CLARITIN  USE SALINE NASAL SPRAY AS NEEDED TO KEEP NOSE MOIST  USE COOL MIST HUMIDIFIER AT NIGHT WHILE YOU SLEEP  Take prednisone pak as directed (she has script at home)  Continue other medications as ordered  Will call with referral appts  Follow up as scheduled or sooner if need be

## 2017-06-26 NOTE — Progress Notes (Signed)
Patient ID: Sierra Kryder V., female   DOB: 18-Feb-1946, 71 y.o.   MRN: [id number]   [facility] ([place]) OFFICE  Provider: DR Arletha Grippe  Code Status:  Goals of Care:  Advanced Directives 08/20/2016  Does Patient Have a Medical Advance Directive? Yes  Type of Advance Directive Living will;Healthcare Power of Attorney  Does patient want to make changes to medical advance directive? -  Copy of [place] in Chart? No - copy requested     Chief Complaint  Patient presents with  . Acute Visit    Possible URI, pain/pressure when urinating and referral to Cardiology   . Medication Refill    No refills needed unless medication changes     HPI: Patient is a 72 y.o. female seen today for an acute visit for URI sx's x several weeks. She has sore throat with dryness, ear pain and itching, CP, f/c. No rhinorrhea, post nasal drip, HA , dizziness, sinus pressure. No SOB. She has palpitations that make her feel weak. She had similar URI sx' in Jan and early Feb 2019 but did not seek medical attention. She has costochondritis. She is followed by cardio Dr [name] but has not seen her since Apr 2018 and pt increased BB on her own to a full tablet.     Past Medical History:  Diagnosis Date  . Abnormal Pap smear 05/08/2011   ASC-US +HPV  . Arthritis   . Asthma   . Diabetes mellitus type 2 with neurological manifestations ([place])   . Diabetic peripheral neuropathy ([place])   . Generalized osteoarthritis   . GERD (gastroesophageal reflux disease)   . Hx of seasonal allergies   . Hyperlipidemia   . Hypertension   . IBS (irritable bowel syndrome)   . S/P total hysterectomy 06/21/2011    Past Surgical History:  Procedure Laterality Date  . OVARIAN CYST SURGERY  1986  . TONSILECTOMY, ADENOIDECTOMY, BILATERAL MYRINGOTOMY AND TUBES  1974  . VAGINAL HYSTERECTOMY  1983     reports that  has never smoked. she has never used smokeless tobacco. She reports that she does not drink alcohol or use  drugs. Social History   Socioeconomic History  . Marital status: Single    Spouse name: Not on file  . Number of children: 4  . Years of education: Not on file  . Highest education level: Not on file  Social Needs  . Financial resource strain: Not on file  . Food insecurity - worry: Not on file  . Food insecurity - inability: Not on file  . Transportation needs - medical: Not on file  . Transportation needs - non-medical: Not on file  Occupational History    Employer: RETIRED  Tobacco Use  . Smoking status: Never Smoker  . Smokeless tobacco: Never Used  Substance and Sexual Activity  . Alcohol use: No  . Drug use: No  . Sexual activity: Not Currently    Birth control/protection: Surgical  Other Topics Concern  . Not on file  Social History Narrative   Diet?    Low fat, low carb, low salt      Do you drink/eat things with caffeine?      Marital status?  single                                  What year were you married?      Do you live in a  house, apartment, assisted living, condo, trailer, etc.?      Is it one or more stories?      How many persons live in your home?      Do you have any pets in your home? (please list)      Current or past profession:      Do you exercise?   yes                 Type & how often? Yoga, water aerobics 1-2x/week      Do you have a living will?      Do you have a DNR form?                                  If not, do you want to discuss one?      Do you have signed POA/HPOA for forms?        Family History  Problem Relation Age of Onset  . Diabetes Father   . Heart disease Father   . Diabetes Mother   . Pancreatic cancer Brother   . Pancreatic cancer Sister     Allergies  Allergen Reactions  . Aspirin   . Pravastatin Sodium Other (See Comments)    Muscle aches    Outpatient Encounter Medications as of 06/26/2017  Medication Sig  . ACCU-CHEK SOFTCLIX LANCETS lancets USE  TO TEST BLOOD SUGAR ONE TIME DAILY FOR  DIABETES  . Alcohol Swabs (B-D SINGLE USE SWABS REGULAR) PADS As directed for diabetes one daily to test blood E11.42  . amLODipine (NORVASC) 5 MG tablet TAKE 1 TABLET DAILY.  Marland Kitchen Blood Glucose Monitoring Suppl (ACCU-CHEK AVIVA PLUS) w/Device KIT Use to test blood sugar twice daily. Dx E11.42  . cholecalciferol (VITAMIN D) 1000 UNITS tablet Take 1,000 Units by mouth daily.   . fluticasone (FLONASE) 50 MCG/ACT nasal spray Place 2 sprays into the nose daily as needed.  . gabapentin (NEURONTIN) 400 MG capsule TAKE 1 CAPSULE TWICE DAILY  . glucose blood (ACCU-CHEK AVIVA PLUS) test strip Use to test blood sugar twice daily. Dx: E11.42  . loratadine (CLARITIN) 10 MG tablet Take 10 mg by mouth daily.  . metFORMIN (GLUCOPHAGE-XR) 500 MG 24 hr tablet TAKE 2 TABLETS TWICE DAILY TO CONTROL BLOOD SUGAR  . metoprolol tartrate (LOPRESSOR) 25 MG tablet Take 0.5 tablets (12.5 mg total) by mouth 2 (two) times daily.  . Multiple Vitamins-Minerals (ICAPS AREDS FORMULA PO) Take by mouth daily.  . Multiple Vitamins-Minerals (MULTIVITAMIN WITH MINERALS) tablet Take 1 tablet by mouth daily.  . Omega-3 Fatty Acids (FISH OIL) 1000 MG CAPS Take 2 capsules by mouth daily.  Marland Kitchen OVER THE COUNTER MEDICATION 1 drop as needed. For dry eyes. Equate Brand Restore Tears Lubricant Eye Drops  . polyethylene glycol (MIRALAX / GLYCOLAX) packet Take 17 g by mouth daily as needed. For constipation  . Probiotic Product (ALIGN) 4 MG CAPS Take by mouth daily.  . rosuvastatin (CRESTOR) 5 MG tablet TAKE 1 TABLET DAILY FOR CHOLESTEROL  . [DISCONTINUED] dexlansoprazole (DEXILANT) 60 MG capsule Take 60 mg by mouth daily.   No facility-administered encounter medications on file as of 06/26/2017.     Review of Systems:  Review of Systems  Constitutional: Positive for chills and fever.  HENT: Positive for dental problem (needs referral to dentist), ear pain and sore throat.   Cardiovascular: Positive for chest pain and palpitations.    Genitourinary:  Positive for dysuria.  All other systems reviewed and are negative.   Health Maintenance  Topic Date Due  . OPHTHALMOLOGY EXAM  06/26/2017 (Originally 04/23/1955)  . MAMMOGRAM  04/16/2018 (Originally 08/09/2016)  . Hepatitis C Screening  04/16/2018 (Originally 06-02-1945)  . URINE MICROALBUMIN  07/03/2017  . HEMOGLOBIN A1C  07/24/2017  . FOOT EXAM  01/23/2018  . TETANUS/TDAP  08/17/2020  . COLONOSCOPY  09/05/2025  . INFLUENZA VACCINE  Completed  . DEXA SCAN  Completed  . PNA vac Low Risk Adult  Completed    Physical Exam: Vitals:   06/26/17 0954  BP: 112/64  Pulse: 67  Temp: 98 F (36.7 C)  TempSrc: Oral  SpO2: 98%  Weight: 157 lb (71.2 kg)  Height: '5\' 6"'  (1.676 m)   Body mass index is 25.34 kg/m. Physical Exam  Constitutional: She is oriented to person, place, and time. She appears well-developed and well-nourished.  Looks ill in NAD  HENT:  TMs intact and appear dull, nonbulging,no redness; no sinus TTP; oropharynx red but no exudate; no hard palate lesions or redness  Eyes: Pupils are equal, round, and reactive to light. Right eye exhibits no discharge. Left eye exhibits no discharge.  Neck: Neck supple.  Cardiovascular: Normal rate, regular rhythm and intact distal pulses. Exam reveals no gallop and no friction rub.  Murmur (1/6 SEM) heard. No LE edema b/l. No calf TTP  Pulmonary/Chest: Effort normal and breath sounds normal. No respiratory distress. She has no wheezes. She has no rales. She exhibits no tenderness.  Abdominal: Soft. Bowel sounds are normal. She exhibits distension. She exhibits no mass. There is tenderness (epigastric and suprapubic; no CVAT). There is no rebound and no guarding.  Musculoskeletal: She exhibits edema.  Lymphadenopathy:    She has no cervical adenopathy.  Neurological: She is alert and oriented to person, place, and time.  Skin: Skin is warm and dry. No rash noted.  Psychiatric: She has a normal mood and affect. Her  behavior is normal. Judgment and thought content normal.    Labs reviewed: Basic Metabolic Panel: Recent Labs    07/03/16 0825 01/23/17 0957  NA 142 141  K 4.5 4.5  CL 106 104  CO2 27 31  GLUCOSE 108* 105*  BUN 17 17  CREATININE 1.08* 0.81  CALCIUM 9.2 9.6  TSH 2.45  --    Liver Function Tests: Recent Labs    07/03/16 0825 01/23/17 0957  AST 13  --   ALT 9 12  ALKPHOS 50  --   BILITOT 0.3  --   PROT 6.7  --   ALBUMIN 4.1  --    No results for input(s): LIPASE, AMYLASE in the last 8760 hours. No results for input(s): AMMONIA in the last 8760 hours. CBC: Recent Labs    07/03/16 0825  WBC 5.2  NEUTROABS 1,872  HGB 13.8  HCT 41.5  MCV 85.0  PLT 187   Lipid Panel: Recent Labs    07/03/16 0825 01/23/17 0957  CHOL 122 162  HDL 54 67  LDLCALC 39 71  TRIG 143 160*  CHOLHDL 2.3 2.4   Lab Results  Component Value Date   HGBA1C 6.6 (H) 01/23/2017    Procedures since last visit: No results found.  Assessment/Plan   ICD-10-CM   1. Pharyngitis, unspecified etiology J02.9 POC Influenza A/B    POC Rapid Strep A  2. Chronic seasonal allergic rhinitis due to pollen J30.1   3. Palpitations R00.2 Ambulatory referral to Cardiology  failing to change as expected  4. Dysuria R30.0 Urinalysis with Reflex Microscopic  5. Type 2 diabetes mellitus with diabetic polyneuropathy, without long-term current use of insulin ([place]) E11.42 Ambulatory referral to Ophthalmology  6. Toothache K08.89 Ambulatory referral to Dentistry    Check RSA and Flu A/B swab - SWABS NEG  Push fluids and rest  Recommend USE NASAL STEROID EVERY DAY THROUGH June  MAY NEED TO CHANGE CLARITIN TO ALLEGRA OR ZYRTEC  MAY NEED TO ADD SINGULAIR (MONTELUKAST) IF NO RESPONSE TO CHANGE IN CLARITIN  USE SALINE NASAL SPRAY AS NEEDED TO KEEP NOSE MOIST  USE COOL MIST HUMIDIFIER AT NIGHT WHILE YOU SLEEP  Take prednisone pak as directed (she has script at home)  Continue other medications as  ordered  Will call with referral appts  Follow up as scheduled or sooner if need be  Freman Lapage S. Perlie Gold  [facility] [address] [place], [place] [address] [phone] Cell (Monday-Friday 8 AM - 5 PM) [phone] After 5 PM and follow prompts

## 2017-06-27 ENCOUNTER — Other Ambulatory Visit: Payer: Self-pay | Admitting: Internal Medicine

## 2017-06-27 DIAGNOSIS — R300 Dysuria: Secondary | ICD-10-CM

## 2017-07-01 ENCOUNTER — Other Ambulatory Visit: Payer: Self-pay | Admitting: *Deleted

## 2017-07-01 DIAGNOSIS — R300 Dysuria: Secondary | ICD-10-CM

## 2017-07-01 DIAGNOSIS — R102 Pelvic and perineal pain: Secondary | ICD-10-CM

## 2017-07-02 ENCOUNTER — Encounter: Payer: Self-pay | Admitting: Cardiology

## 2017-07-02 ENCOUNTER — Ambulatory Visit: Payer: Medicare HMO | Admitting: Cardiology

## 2017-07-02 VITALS — BP 126/78 | HR 67 | Ht 66.0 in | Wt 157.1 lb

## 2017-07-02 DIAGNOSIS — R002 Palpitations: Secondary | ICD-10-CM | POA: Diagnosis not present

## 2017-07-02 MED ORDER — METOPROLOL TARTRATE 25 MG PO TABS: 12.5000 mg | ORAL_TABLET | Freq: Two times a day (BID) | ORAL | 3 refills | Status: DC

## 2017-07-02 NOTE — Progress Notes (Signed)
07/02/2017 Sierra Gfeller V.   [birth year]  [id number]  Primary Physician Gildardo Cranker, DO Primary Cardiologist: Dr. [name]   Reason for Visit/CC: Palpitations  HPI:  Sierra Boord V. is a 71 y.o. female who is being seen today for the evaluation of palpitations at the request of Gildardo Cranker, DO.  Pt is followed by Dr. [name]. Her last OV was 07/25/16. She was first evaluated by Dr. [name] in 2015 for palpitations and worsening DOE. Her EKG was completely normal and she underwent pharmacologic nuclear stress testing that showed normal EF and no ischemia or prior infarct. I don't see that she wore an ambulatory monitor, but pt reports that she wore one for 24 hrs and findings were "ok". Last year when evalauted, she complained of chest pain that was c/w costochondritis, which Dr. [name] recommended a course of anti inflammatory medications. Additional medical problems include HTN, HLD and varicose veins, followed by Dr. Trula Slade.   Pt was recently seen by her PCP and complained of palpitations and was referred back to our office for examination. She is currently asymptomatic, but reports that last week she was having very frequent palpitations. Symptoms lasted roughly all day. She denies triggers. No caffeine or ETOH. She has been compliant with metoprolol and has been increasing from from 1/2 tablet to 1 whole tablet as needed for increased palpitations, which has helped. She denies n/v/d. She has been staying well hydrated. No recent labs, but she plans to f/u with PCP again in several more weeks and plans to have blood work done for her yearly. She denies exertional CP. No dyspnea, dizziness, syncope/ near syncope, LEE, orthopnea or PND. EKG currently shows NSR, HR 67 bpm. BP is well controlled at 126/78.    Current Meds  Medication Sig  . ACCU-CHEK SOFTCLIX LANCETS lancets USE  TO TEST BLOOD SUGAR ONE TIME DAILY FOR DIABETES  . Alcohol Swabs (B-D SINGLE USE SWABS REGULAR) PADS As  directed for diabetes one daily to test blood E11.42  . amLODipine (NORVASC) 5 MG tablet Take 5 mg by mouth daily.  . Blood Glucose Monitoring Suppl (ACCU-CHEK AVIVA PLUS) w/Device KIT Use to test blood sugar twice daily. Dx E11.42  . cholecalciferol (VITAMIN D) 1000 UNITS tablet Take 1,000 Units by mouth daily.   . fluticasone (FLONASE) 50 MCG/ACT nasal spray Place 2 sprays into both nostrils 2 (two) times daily.  Marland Kitchen gabapentin (NEURONTIN) 400 MG capsule Take 400 mg by mouth 2 (two) times daily.  Marland Kitchen glucose blood (ACCU-CHEK AVIVA PLUS) test strip Use to test blood sugar twice daily. Dx: E11.42  . loratadine (CLARITIN) 10 MG tablet Take 10 mg by mouth daily.  . metFORMIN (GLUCOPHAGE-XR) 500 MG 24 hr tablet Take 1,000 mg by mouth 2 (two) times daily.  . metoprolol tartrate (LOPRESSOR) 25 MG tablet Take 0.5 tablets (12.5 mg total) by mouth 2 (two) times daily.  . Multiple Vitamins-Minerals (ICAPS AREDS FORMULA PO) Take 1 capsule by mouth daily.   . Multiple Vitamins-Minerals (MULTIVITAMIN WITH MINERALS) tablet Take 1 tablet by mouth daily.  . Omega-3 Fatty Acids (FISH OIL) 1000 MG CAPS Take 2 capsules by mouth daily.  Marland Kitchen OVER THE COUNTER MEDICATION Place 1 drop into both eyes 3 (three) times daily as needed. For dry eyes. Equate Brand Restore Tears Lubricant Eye Drops   . polyethylene glycol (MIRALAX / GLYCOLAX) packet Take 17 g by mouth daily as needed. For constipation  . Probiotic Product (ALIGN) 4 MG CAPS Take 1 capsule by mouth daily.   Marland Kitchen  rosuvastatin (CRESTOR) 5 MG tablet Take 5 mg by mouth daily.  . [DISCONTINUED] metoprolol tartrate (LOPRESSOR) 25 MG tablet Take 0.5 tablets (12.5 mg total) by mouth 2 (two) times daily.   Allergies  Allergen Reactions  . Aspirin   . Pravastatin Sodium Other (See Comments)    Muscle aches   Past Medical History:  Diagnosis Date  . Abnormal Pap smear 05/08/2011   ASC-US +HPV  . Arthritis   . Asthma   . Diabetes mellitus type 2 with neurological  manifestations ([place])   . Diabetic peripheral neuropathy ([place])   . Generalized osteoarthritis   . GERD (gastroesophageal reflux disease)   . Hx of seasonal allergies   . Hyperlipidemia   . Hypertension   . IBS (irritable bowel syndrome)   . S/P total hysterectomy 06/21/2011   Family History  Problem Relation Age of Onset  . Diabetes Father   . Heart disease Father   . Diabetes Mother   . Pancreatic cancer Brother   . Pancreatic cancer Sister    Past Surgical History:  Procedure Laterality Date  . OVARIAN CYST SURGERY  1986  . TONSILECTOMY, ADENOIDECTOMY, BILATERAL MYRINGOTOMY AND TUBES  1974  . VAGINAL HYSTERECTOMY  1983   Social History   Socioeconomic History  . Marital status: Single    Spouse name: Not on file  . Number of children: 4  . Years of education: Not on file  . Highest education level: Not on file  Social Needs  . Financial resource strain: Not on file  . Food insecurity - worry: Not on file  . Food insecurity - inability: Not on file  . Transportation needs - medical: Not on file  . Transportation needs - non-medical: Not on file  Occupational History    Employer: RETIRED  Tobacco Use  . Smoking status: Never Smoker  . Smokeless tobacco: Never Used  Substance and Sexual Activity  . Alcohol use: No  . Drug use: No  . Sexual activity: Not Currently    Birth control/protection: Surgical  Other Topics Concern  . Not on file  Social History Narrative   Diet?    Low fat, low carb, low salt      Do you drink/eat things with caffeine?      Marital status?  single                                  What year were you married?      Do you live in a house, apartment, assisted living, condo, trailer, etc.?      Is it one or more stories?      How many persons live in your home?      Do you have any pets in your home? (please list)      Current or past profession:      Do you exercise?   yes                 Type & how often? Yoga, water aerobics  1-2x/week      Do you have a living will?      Do you have a DNR form?                                  If not, do you want to discuss one?  Do you have signed POA/HPOA for forms?         Review of Systems: General: negative for chills, fever, night sweats or weight changes.  Cardiovascular: negative for chest pain, dyspnea on exertion, edema, orthopnea, palpitations, paroxysmal nocturnal dyspnea or shortness of breath Dermatological: negative for rash Respiratory: negative for cough or wheezing Urologic: negative for hematuria Abdominal: negative for nausea, vomiting, diarrhea, bright red blood per rectum, melena, or hematemesis Neurologic: negative for visual changes, syncope, or dizziness All other systems reviewed and are otherwise negative except as noted above.   Physical Exam:  Blood pressure 126/78, pulse 67, height '5\' 6"'  (1.676 m), weight 157 lb 1.9 oz (71.3 kg).  General appearance: alert, cooperative and no distress Neck: no carotid bruit and no JVD Lungs: clear to auscultation bilaterally Heart: regular rate and rhythm, S1, S2 normal, no murmur, click, rub or gallop Extremities: extremities normal, atraumatic, no cyanosis or edema Pulses: 2+ and symmetric Skin: Skin color, texture, turgor normal. No rashes or lesions Neurologic: Grossly normal   EKG NSR, 67 bpm -- personally reviewed   ASSESSMENT AND PLAN:   1. Palpitations: recent change from baseline with more frequent episodes lasting longer in duration. She is currently asymptomatic. EKG shows NSR. HR controlled in the 60s. She will continue to take metoprolol, 12.5 mg BID, but will take a whole tablet PRN based on symptoms (pt states unable to change maintanence dose to 25 mg BID given issues with low BP and low HR in the 50s). We will arrange for 2 week ambulatory monitor for further evaluation and will also get a repeat 2D echo. We discussed basic laboratory work, including CBC, BMP, Mg and TSH, however she  would prefer to wait until she see's her PCP back in several weeks to have blood checked. We will have her f/u after cardiac w/u is complete.    Sierra Ray, MHS [facility] 07/02/2017 10:59 AM

## 2017-07-02 NOTE — Patient Instructions (Signed)
Medication Instructions:   Your physician recommends that you continue on your current medications as directed. Please refer to the Current Medication list given to you today.   If you need a refill on your cardiac medications before your next appointment, please call your pharmacy.  Labwork:  NONE ORDERED  TODAY    Testing/Procedures:  Your physician has requested that you have an echocardiogram. Echocardiography is a painless test that uses sound waves to create images of your heart. It provides your doctor with information about the size and shape of your heart and how well your heart's chambers and valves are working. This procedure takes approximately one hour. There are no restrictions for this procedure.  Your physician has recommended that you wear an event monitor. Event monitors are medical devices that record the heart's electrical activity. Doctors most often [place] these monitors to diagnose arrhythmias. Arrhythmias are problems with the speed or rhythm of the heartbeat. The monitor is a small, portable device. You can wear one while you do your normal daily activities. This is usually used to diagnose what is causing palpitations/syncope (passing out).     Follow-Up:  Before 08-02-2017  [facility]    Any Other Special Instructions Will Be Listed Below (If Applicable).

## 2017-07-05 ENCOUNTER — Other Ambulatory Visit: Payer: Self-pay

## 2017-07-05 MED ORDER — ACCU-CHEK SOFTCLIX LANCETS MISC: 3 refills | Status: DC

## 2017-07-05 NOTE — Telephone Encounter (Signed)
A fax was received from [facility] requesting a refill of the Accu-chek Softclix lancets. Rx was sent to the pharmacy electronically.

## 2017-07-08 ENCOUNTER — Other Ambulatory Visit: Payer: Self-pay | Admitting: Internal Medicine

## 2017-07-09 ENCOUNTER — Ambulatory Visit: Payer: Medicare HMO | Admitting: Obstetrics & Gynecology

## 2017-07-09 ENCOUNTER — Encounter: Payer: Self-pay | Admitting: Obstetrics & Gynecology

## 2017-07-09 VITALS — BP 130/70 | Ht 65.25 in | Wt 156.0 lb

## 2017-07-09 DIAGNOSIS — N898 Other specified noninflammatory disorders of vagina: Secondary | ICD-10-CM | POA: Diagnosis not present

## 2017-07-09 DIAGNOSIS — R102 Pelvic and perineal pain: Secondary | ICD-10-CM

## 2017-07-09 LAB — WET PREP FOR TRICH, YEAST, CLUE

## 2017-07-09 NOTE — Progress Notes (Signed)
    Rahmah Grein V. 09-11-1945 [id number]        72 y.o.  [id number] Single  RP: Increased vaginal discharge and pelvic pain x 2 weeks  HPI: S/P Total Hysterectomy for menorrhagia.  Menopause on no HRT.  Pelvic pain in midline and to the Rt pelvic area, pressure feeling x 2 weeks.  C/O vaginal discharge with intermittent itching.  Abstinent.  No UTI Sx currently.  BMs wnl.  Has felt rather depressed lately, no suicidal ideation, but not as active physically.   OB History  Gravida Para Term Preterm AB Living  5 4 4   1 4   SAB TAB Ectopic Multiple Live Births  1            # Outcome Date GA Lbr Len/2nd Weight Sex Delivery Anes PTL Lv  5 Term           4 Term           3 Term           2 Term           1 SAB             Obstetric Comments  I adopted child    Past medical history,surgical history, problem list, medications, allergies, family history and social history were all reviewed and documented in the EPIC chart.   Directed ROS with pertinent positives and negatives documented in the history of present illness/assessment and plan.  Exam:  Vitals:   07/09/17 1357  BP: 130/70  Weight: 156 lb (70.8 kg)  Height: 5' 5.25" (1.657 m)   General appearance:  Normal  Abdomen: Soft, NT  Gynecologic exam: Vulva normal.  Speculum: Status post total hysterectomy.  Short vagina with atrophy.  Normal vaginal secretions.  Wet prep done.  Bimanual exam: No pelvic mass felt.  Mildly tender.  Wet prep negative. Urine analysis: Clear, nitrites negative, white blood cells negative, red blood cells 0-2, no bacteria.  Urine culture pending.   Assessment/Plan:  72 y.o. [id number]   1. Pelvic pain in female Pelvic pain more to the right side for 2 weeks.  No pelvic mass felt, but will confirm with a pelvic ultrasound at the next visit.  Urine analysis within normal limits, pending urine culture. - Urinalysis,Complete w/RFL Culture - US Transvaginal Non-OB; Future  2. Vaginal  discharge Normal vaginal secretions on exam and negative wet prep.  Patient reassured. - WET PREP FOR TRICH, YEAST, CLUE  Counseling and coordination of care more than 50% for 30 minutes.  [name] MD, 2:13 PM 07/09/2017

## 2017-07-09 NOTE — Patient Instructions (Signed)
1. Pelvic pain in female Pelvic pain more to the right side for 2 weeks.  No pelvic mass felt, but will confirm with a pelvic ultrasound at the next visit.  Urine analysis within normal limits, pending urine culture. - Urinalysis,Complete w/RFL Culture - US Transvaginal Non-OB; Future  2. Vaginal discharge Normal vaginal secretions on exam and negative wet prep.  Patient reassured. - WET PREP FOR [place], YEAST, CLUE  Sierra Ray, it was a pleasure meeting you today!  I will see you soon for your pelvic ultrasound.

## 2017-07-11 ENCOUNTER — Telehealth: Payer: Self-pay | Admitting: *Deleted

## 2017-07-11 DIAGNOSIS — R102 Pelvic and perineal pain: Secondary | ICD-10-CM

## 2017-07-11 LAB — URINE CULTURE
MICRO NUMBER:: 90381755
Result:: NO GROWTH
SPECIMEN QUALITY:: ADEQUATE

## 2017-07-11 LAB — URINALYSIS, COMPLETE W/RFL CULTURE
Bacteria, UA: NONE SEEN /HPF
Bilirubin Urine: NEGATIVE
Glucose, UA: NEGATIVE
Hyaline Cast: NONE SEEN /LPF
Ketones, ur: NEGATIVE
Leukocyte Esterase: NEGATIVE
Nitrites, Initial: NEGATIVE
Protein, ur: NEGATIVE
Specific Gravity, Urine: 1.009 (ref 1.001–1.03)
WBC, UA: NONE SEEN /HPF (ref 0–5)
pH: 5.5 (ref 5.0–8.0)

## 2017-07-11 LAB — CULTURE INDICATED

## 2017-07-11 NOTE — Telephone Encounter (Signed)
-----   Message from [name], MD sent at 07/11/2017  8:48 AM EDT ----- Regarding: RE: [place]  Yes, if we cannot do one next week, please refer out and organize a visit with me after to discuss results.  ----- Message ----- From: Thamas Jaegers, RMA Sent: 07/11/2017   8:36 AM To: [name], MD Subject: FW: [place]                                         Dr.Lavoie please see below, are you okay with this? ----- Message ----- From: Sierra Ray Sent: 07/09/2017   2:43 PM To: Thamas Jaegers, RMA Subject: [place]                                             Lavoie ordered for patient to have ultrasound and our first available is April 17. Patient wants to be referred to another facility so she can have it done sooner.

## 2017-07-11 NOTE — Telephone Encounter (Signed)
Left message for pt to call, order placed at [facility], pt will still need visit with Dr.Lavoie to discuss results.

## 2017-07-12 NOTE — Telephone Encounter (Signed)
Left detailed message with the below on voicemail

## 2017-07-16 ENCOUNTER — Ambulatory Visit (INDEPENDENT_AMBULATORY_CARE_PROVIDER_SITE_OTHER): Payer: Medicare HMO

## 2017-07-16 ENCOUNTER — Ambulatory Visit (HOSPITAL_COMMUNITY): Payer: Medicare HMO | Attending: Cardiovascular Disease

## 2017-07-16 ENCOUNTER — Other Ambulatory Visit: Payer: Self-pay

## 2017-07-16 DIAGNOSIS — I119 Hypertensive heart disease without heart failure: Secondary | ICD-10-CM | POA: Diagnosis not present

## 2017-07-16 DIAGNOSIS — E785 Hyperlipidemia, unspecified: Secondary | ICD-10-CM | POA: Diagnosis not present

## 2017-07-16 DIAGNOSIS — E119 Type 2 diabetes mellitus without complications: Secondary | ICD-10-CM | POA: Insufficient documentation

## 2017-07-16 DIAGNOSIS — R002 Palpitations: Secondary | ICD-10-CM

## 2017-07-17 ENCOUNTER — Telehealth: Payer: Self-pay | Admitting: Cardiology

## 2017-07-17 ENCOUNTER — Ambulatory Visit
Admission: RE | Admit: 2017-07-17 | Discharge: 2017-07-17 | Disposition: A | Discharge status: Home / Self Care | Payer: Medicare HMO | Source: Ambulatory Visit | Attending: Obstetrics & Gynecology | Admitting: Obstetrics & Gynecology

## 2017-07-17 DIAGNOSIS — R102 Pelvic and perineal pain: Secondary | ICD-10-CM | POA: Diagnosis not present

## 2017-07-17 NOTE — Telephone Encounter (Signed)
Called pt back and verified her PCP was a [employer]ist, [employer]. I told her that her PCP can see Sierra Ray note from her OV on 3/19 with a request for a lab draw. A faxed order should be unnecessary since her PCP is a [facility]. She verbalized understanding and will call her PCP office.

## 2017-07-17 NOTE — Telephone Encounter (Signed)
New Message    Needs her lab orders sent to Dr Gildardo Cranker office , so she can have her labs done there Friday  Fax [phone]

## 2017-07-18 ENCOUNTER — Other Ambulatory Visit: Payer: Self-pay

## 2017-07-18 ENCOUNTER — Other Ambulatory Visit: Payer: Self-pay | Admitting: Internal Medicine

## 2017-07-18 DIAGNOSIS — I10 Essential (primary) hypertension: Secondary | ICD-10-CM

## 2017-07-18 DIAGNOSIS — E785 Hyperlipidemia, unspecified: Secondary | ICD-10-CM

## 2017-07-18 DIAGNOSIS — Z79899 Other long term (current) drug therapy: Secondary | ICD-10-CM

## 2017-07-18 DIAGNOSIS — E1142 Type 2 diabetes mellitus with diabetic polyneuropathy: Secondary | ICD-10-CM

## 2017-07-19 ENCOUNTER — Other Ambulatory Visit: Payer: Medicare HMO

## 2017-07-19 DIAGNOSIS — E1142 Type 2 diabetes mellitus with diabetic polyneuropathy: Secondary | ICD-10-CM | POA: Diagnosis not present

## 2017-07-19 DIAGNOSIS — I10 Essential (primary) hypertension: Secondary | ICD-10-CM | POA: Diagnosis not present

## 2017-07-19 DIAGNOSIS — Z79899 Other long term (current) drug therapy: Secondary | ICD-10-CM | POA: Diagnosis not present

## 2017-07-19 DIAGNOSIS — E785 Hyperlipidemia, unspecified: Secondary | ICD-10-CM | POA: Diagnosis not present

## 2017-07-20 LAB — COMPLETE METABOLIC PANEL WITH GFR
AG Ratio: 1.6 (calc) (ref 1.0–2.5)
ALT: 11 U/L (ref 6–29)
AST: 14 U/L (ref 10–35)
Albumin: 3.9 g/dL (ref 3.6–5.1)
Alkaline phosphatase (APISO): 45 U/L (ref 33–130)
BUN: 15 mg/dL (ref 7–25)
CO2: 30 mmol/L (ref 20–32)
Calcium: 9.1 mg/dL (ref 8.6–10.4)
Chloride: 107 mmol/L (ref 98–110)
Creat: 0.76 mg/dL (ref 0.60–0.93)
GFR, Est African American: 92 mL/min/{1.73_m2} (ref >=60)
GFR, Est Non African American: 79 mL/min/{1.73_m2} (ref >=60)
Globulin: 2.4 g/dL (calc) (ref 1.9–3.7)
Glucose, Bld: 105 mg/dL — ABNORMAL HIGH (ref 65–99)
Potassium: 4.2 mmol/L (ref 3.5–5.3)
Sodium: 143 mmol/L (ref 135–146)
Total Bilirubin: 0.4 mg/dL (ref 0.2–1.2)
Total Protein: 6.3 g/dL (ref 6.1–8.1)

## 2017-07-20 LAB — CBC WITH DIFFERENTIAL/PLATELET
Basophils Absolute: 42 cells/uL (ref 0–200)
Basophils Relative: 0.8 %
Eosinophils Absolute: 187 cells/uL (ref 15–500)
Eosinophils Relative: 3.6 %
HCT: 38.1 % (ref 35.0–45.0)
Hemoglobin: 13.1 g/dL (ref 11.7–15.5)
Lymphs Abs: 2803 cells/uL (ref 850–3900)
MCH: 28.9 pg (ref 27.0–33.0)
MCHC: 34.4 g/dL (ref 32.0–36.0)
MCV: 83.9 fL (ref 80.0–100.0)
MPV: 9.9 fL (ref 7.5–12.5)
Monocytes Relative: 9 %
Neutro Abs: 1700 cells/uL (ref 1500–7800)
Neutrophils Relative %: 32.7 %
Platelets: 166 10*3/uL (ref 140–400)
RBC: 4.54 10*6/uL (ref 3.80–5.10)
RDW: 13.1 % (ref 11.0–15.0)
Total Lymphocyte: 53.9 %
WBC mixed population: 468 cells/uL (ref 200–950)
WBC: 5.2 10*3/uL (ref 3.8–10.8)

## 2017-07-20 LAB — HEMOGLOBIN A1C
Hgb A1c MFr Bld: 6.6 % of total Hgb — ABNORMAL HIGH (ref <5.7)
Mean Plasma Glucose: 143 (calc)
eAG (mmol/L): 7.9 (calc)

## 2017-07-20 LAB — LIPID PANEL
Cholesterol: 124 mg/dL (ref <200)
HDL: 52 mg/dL (ref >50)
LDL Cholesterol (Calc): 51 mg/dL (calc)
Non-HDL Cholesterol (Calc): 72 mg/dL (calc) (ref <130)
Total CHOL/HDL Ratio: 2.4 (calc) (ref <5.0)
Triglycerides: 126 mg/dL (ref <150)

## 2017-07-20 LAB — TSH: TSH: 0.99 mIU/L (ref 0.40–4.50)

## 2017-07-24 ENCOUNTER — Ambulatory Visit (INDEPENDENT_AMBULATORY_CARE_PROVIDER_SITE_OTHER): Payer: Medicare HMO | Admitting: Internal Medicine

## 2017-07-24 ENCOUNTER — Ambulatory Visit (INDEPENDENT_AMBULATORY_CARE_PROVIDER_SITE_OTHER): Payer: Medicare HMO

## 2017-07-24 ENCOUNTER — Encounter: Payer: Self-pay | Admitting: Internal Medicine

## 2017-07-24 VITALS — BP 116/64 | HR 71 | Temp 97.7°F | Ht 65.0 in | Wt 154.0 lb

## 2017-07-24 VITALS — BP 116/64 | HR 71 | Temp 97.1°F | Ht 65.0 in | Wt 154.0 lb

## 2017-07-24 DIAGNOSIS — E2839 Other primary ovarian failure: Secondary | ICD-10-CM

## 2017-07-24 DIAGNOSIS — Z79899 Other long term (current) drug therapy: Secondary | ICD-10-CM | POA: Diagnosis not present

## 2017-07-24 DIAGNOSIS — J301 Allergic rhinitis due to pollen: Secondary | ICD-10-CM | POA: Diagnosis not present

## 2017-07-24 DIAGNOSIS — E1142 Type 2 diabetes mellitus with diabetic polyneuropathy: Secondary | ICD-10-CM | POA: Diagnosis not present

## 2017-07-24 DIAGNOSIS — Z0000 Encounter for general adult medical examination without abnormal findings: Secondary | ICD-10-CM

## 2017-07-24 DIAGNOSIS — K649 Unspecified hemorrhoids: Secondary | ICD-10-CM

## 2017-07-24 DIAGNOSIS — I10 Essential (primary) hypertension: Secondary | ICD-10-CM

## 2017-07-24 DIAGNOSIS — E785 Hyperlipidemia, unspecified: Secondary | ICD-10-CM

## 2017-07-24 DIAGNOSIS — R3989 Other symptoms and signs involving the genitourinary system: Secondary | ICD-10-CM

## 2017-07-24 DIAGNOSIS — M67472 Ganglion, left ankle and foot: Secondary | ICD-10-CM | POA: Diagnosis not present

## 2017-07-24 MED ORDER — GLUCOSE BLOOD VI STRP: ORAL_STRIP | 3 refills | Status: DC

## 2017-07-24 MED ORDER — HYDROCORTISONE 2.5 % RE CREA: 1.0000 "application " | TOPICAL_CREAM | Freq: Two times a day (BID) | RECTAL | 3 refills | Status: DC

## 2017-07-24 NOTE — Progress Notes (Addendum)
Subjective:   Sierra Stash V. is a 72 y.o. female who presents for Medicare Annual (Subsequent) preventive examination.  Last AWV-07/03/2016       Objective:     Vitals: BP 116/64 (BP Location: Left Arm, Patient Position: Sitting)   Pulse 71   Temp 97.7 F (36.5 C) (Oral)   Ht _0  (1.651 m)   Wt 154 lb (69.9 kg)   SpO2 98%   BMI 25.63 kg/m   Body mass index is 25.63 kg/m.  Advanced Directives 07/24/2017 08/20/2016 07/09/2016 07/03/2016 02/03/2016 09/28/2015 08/25/2015  Does Patient Have a Medical Advance Directive? _1  Yes Yes  Type of Advance Directive Living will Living will;Healthcare Power of Attorney Living will [place];Living will Living will Living will [place]  Does patient want to make changes to medical advance directive? No - Patient declined - - No - Patient declined No - Patient declined No - Patient declined No - Patient declined  Copy of [place] in Chart? - No - copy requested - Yes No - copy requested Yes Yes    Tobacco Social History   Tobacco Use  Smoking Status Never Smoker  Smokeless Tobacco Never Used     Counseling given: Not Answered   Clinical Intake:  Pre-visit preparation completed: No  Pain : No/denies pain     Nutritional Risks: None Diabetes: Yes CBG done?: No Did pt. bring in CBG monitor from home?: No  How often do you need to have someone help you when you read instructions, pamphlets, or other written materials from your doctor or pharmacy?: 1 - Never What is the last grade level you completed in school?: 2 years college  Interpreter Needed?: No  Information entered by :: Tyson Dense, RN  Past Medical History:  Diagnosis Date  . Abnormal Pap smear 05/08/2011   ASC-US +HPV  . Arthritis   . Asthma   . Diabetes mellitus type 2 with neurological manifestations ([place])   . Diabetic peripheral neuropathy ([place])   . Generalized osteoarthritis     . GERD (gastroesophageal reflux disease)   . Hx of seasonal allergies   . Hyperlipidemia   . Hypertension   . IBS (irritable bowel syndrome)   . S/P total hysterectomy 06/21/2011   Past Surgical History:  Procedure Laterality Date  . OVARIAN CYST SURGERY  1986  . PELVIC LAPAROSCOPY    . TONSILECTOMY, ADENOIDECTOMY, BILATERAL MYRINGOTOMY AND TUBES  1974  . VAGINAL HYSTERECTOMY  1983   Family History  Problem Relation Age of Onset  . Diabetes Father   . Heart disease Father   . Diabetes Mother   . Cancer Mother        colon   . Pancreatic cancer Brother   . Pancreatic cancer Sister   . Cancer Paternal Grandmother        liver   . Breast cancer Cousin    Social History   Socioeconomic History  . Marital status: Single    Spouse name: Not on file  . Number of children: 4  . Years of education: Not on file  . Highest education level: Not on file  Occupational History    Employer: RETIRED  Social Needs  . Financial resource strain: Not hard at all  . Food insecurity:    Worry: Never true    Inability: Never true  . Transportation needs:    Medical: No    Non-medical: No  Tobacco Use  .  Smoking status: Never Smoker  . Smokeless tobacco: Never Used  Substance and Sexual Activity  . Alcohol use: No  . Drug use: No  . Sexual activity: Not Currently    Birth control/protection: Surgical    Comment: 1st intercourse- 20, partners- 8,   Lifestyle  . Physical activity:    Days per week: 3 days    Minutes per session: 50 min  . Stress: Not at all  Relationships  . Social connections:    Talks on phone: More than three times a week    Gets together: More than three times a week    Attends religious service: More than 4 times per year    Active member of club or organization: No    Attends meetings of clubs or organizations: Never    Relationship status: Never married  Other Topics Concern  . Not on file  Social History Narrative   Diet?    Low fat, low carb, low  salt      Do you drink/eat things with caffeine?      Marital status?  single                                  What year were you married?      Do you live in a house, apartment, assisted living, condo, trailer, etc.?      Is it one or more stories?      How many persons live in your home?      Do you have any pets in your home? (please list)      Current or past profession:      Do you exercise?   yes                 Type & how often? Yoga, water aerobics 1-2x/week      Do you have a living will?      Do you have a DNR form?                                  If not, do you want to discuss one?      Do you have signed POA/HPOA for forms?        Outpatient Encounter Medications as of 07/24/2017  Medication Sig  . ACCU-CHEK SOFTCLIX LANCETS lancets USE  TO TEST BLOOD SUGAR ONE TIME DAILY FOR DIABETES  . Alcohol Swabs (B-D SINGLE USE SWABS REGULAR) PADS As directed for diabetes one daily to test blood E11.42  . amLODipine (NORVASC) 5 MG tablet Take 5 mg by mouth daily.  . Blood Glucose Monitoring Suppl (ACCU-CHEK AVIVA PLUS) w/Device KIT Use to test blood sugar twice daily. Dx E11.42  . cholecalciferol (VITAMIN D) 1000 UNITS tablet Take 1,000 Units by mouth daily.   . fluticasone (FLONASE) 50 MCG/ACT nasal spray Place 2 sprays into both nostrils 2 (two) times daily.  Marland Kitchen gabapentin (NEURONTIN) 400 MG capsule Take 400 mg by mouth 2 (two) times daily.  Marland Kitchen glucose blood (ACCU-CHEK AVIVA PLUS) test strip Use to test blood sugar twice daily. Dx: E11.42  . loratadine (CLARITIN) 10 MG tablet Take 10 mg by mouth daily.  . metFORMIN (GLUCOPHAGE-XR) 500 MG 24 hr tablet Take 1,000 mg by mouth 2 (two) times daily.  . metoprolol tartrate (LOPRESSOR) 25 MG tablet Take 0.5 tablets (12.5 mg total) by  mouth 2 (two) times daily.  . Multiple Vitamins-Minerals (ICAPS AREDS FORMULA PO) Take 1 capsule by mouth daily.   . Multiple Vitamins-Minerals (MULTIVITAMIN WITH MINERALS) tablet Take 1 tablet by mouth  daily.  . Omega-3 Fatty Acids (FISH OIL) 1000 MG CAPS Take 2 capsules by mouth daily.  Marland Kitchen OVER THE COUNTER MEDICATION Place 1 drop into both eyes 3 (three) times daily as needed. For dry eyes. Equate Brand Restore Tears Lubricant Eye Drops   . polyethylene glycol (MIRALAX / GLYCOLAX) packet Take 17 g by mouth daily as needed. For constipation  . Probiotic Product (ALIGN) 4 MG CAPS Take 1 capsule by mouth daily.   . rosuvastatin (CRESTOR) 5 MG tablet Take 5 mg by mouth daily.  . [DISCONTINUED] predniSONE (DELTASONE) 10 MG tablet Take 10 mg by mouth daily with breakfast.   No facility-administered encounter medications on file as of 07/24/2017.     Activities of Daily Living In your present state of health, do you have any difficulty performing the following activities: 07/24/2017  Hearing? N  Vision? N  Difficulty concentrating or making decisions? Y  Walking or climbing stairs? N  Dressing or bathing? N  Doing errands, shopping? N  Preparing Food and eating ? N  Using the Toilet? N  In the past six months, have you accidently leaked urine? Y  Do you have problems with loss of bowel control? Y  Managing your Medications? N  Managing your Finances? N  Housekeeping or managing your Housekeeping? N  Some recent data might be hidden    Patient Care Team: Gildardo Cranker, DO as PCP - General (Internal Medicine) Dorothy Spark, MD as Consulting Physician (Cardiology)    Assessment:   This is a routine wellness examination for Sierra Ray.  Exercise Activities and Dietary recommendations Current Exercise Habits: Home exercise routine, Type of exercise: walking, Time (Minutes): 50, Frequency (Times/Week): 3, Weekly Exercise (Minutes/Week): 150, Exercise limited by: None identified  Goals    . Exercise 3x per week (30 min per time)     Starting 07/03/16 I will start doing exercise three days a week.I would like to walk a total of 30 min three times a week and eventually get to aquatics.     . Patient Stated     Patient will like to get back into aquatics with her friends       Fall Risk Fall Risk  07/24/2017 06/26/2017 01/23/2017 07/09/2016 07/03/2016  Falls in the past year? _0    Is the patient's home free of loose throw rugs in walkways, pet beds, electrical cords, etc?   yes      Grab bars in the bathroom? no      Handrails on the stairs?   yes      Adequate lighting?   yes  Depression Screen PHQ 2/9 Scores 07/24/2017 01/23/2017 07/09/2016 07/03/2016  PHQ - 2 Score 0 0 4 3  PHQ- 9 Score - - 15 10     Cognitive Function MMSE - Mini Mental State Exam 07/24/2017 07/03/2016 07/01/2015  Not completed: - - (No Data)  Orientation to time _1 Orientation to Place _2 Registration _3 Attention/ Calculation _4 Recall _5 Language- name 2 objects _6 Language- repeat _7 Language- follow 3 step command _8 Language- read & follow direction _9 Write a sentence 1 1  1  Copy design _0 Total score _1 Immunization History  Administered Date(s) Administered  . Influenza,inj,Quad PF,6+ Mos 02/03/2016, 01/23/2017  . Pneumococcal Conjugate-13 07/30/2014  . Pneumococcal Polysaccharide-23 11/17/2005, 05/20/2012  . Td 08/18/2010    Qualifies for Shingles Vaccine? Yes, educated and declined  Screening Tests Health Maintenance  Topic Date Due  . OPHTHALMOLOGY EXAM  04/23/1955  . URINE MICROALBUMIN  07/03/2017  . MAMMOGRAM  04/16/2018 (Originally 08/09/2016)  . Hepatitis C Screening  04/16/2018 (Originally 08-09-1945)  . INFLUENZA VACCINE  11/14/2017  . HEMOGLOBIN A1C  01/18/2018  . FOOT EXAM  01/23/2018  . TETANUS/TDAP  08/17/2020  . COLONOSCOPY  09/05/2025  . DEXA SCAN  Completed  . PNA vac Low Risk Adult  Completed    Cancer Screenings: Lung: Low Dose CT Chest recommended if Age 25-80 years, 30 pack-year currently smoking OR have quit w/in 15years. Patient does not qualify. Breast:  Up to date on Mammogram?  No, recently ordered by other provider Up to date of Bone Density/Dexa? No, ordered Colorectal: up to date  Additional Screenings: Hepatitis C Screening: declined Diabetic Eye exam completed June 2018. Medical release information filled out     Plan:    I have personally reviewed and addressed the Medicare Annual Wellness questionnaire and have noted the following in the patient's chart:  A. Medical and social history B. Use of alcohol, tobacco or illicit drugs  C. Current medications and supplements D. Functional ability and status E.  Nutritional status F.  Physical activity G. Advance directives H. List of other physicians I.  Hospitalizations, surgeries, and ER visits in previous 12 months J.  [place] to include hearing, vision, cognitive, depression L. Referrals and appointments - none  In addition, I have reviewed and discussed with patient certain preventive protocols, quality metrics, and best practice recommendations. A written personalized care plan for preventive services as well as general preventive health recommendations were provided to patient.  See attached scanned questionnaire for additional information.   Signed,   Tyson Dense, RN Nurse Health Advisor  Patient Concerns: None

## 2017-07-24 NOTE — Patient Instructions (Addendum)
START PROCTOSOL CREAM 2 TIMES DAILY TO RECTAL AREA  Will call with urine results  Recommend podiatry or orthopedic eval for left foot painful cyst  Continue current medications as ordered  Follow up with specialists as scheduled  Follow up in 6 mos for DM, HTN, bladder pain  Keeping You Healthy  Get These Tests  Blood Pressure- Have your blood pressure checked by your healthcare provider at least once a year.  Normal blood pressure is 120/80.  Weight- Have your body mass index (BMI) calculated to screen for obesity.  BMI is a measure of body fat based on height and weight.  You can calculate your own BMI at [employer]  Cholesterol- Have your cholesterol checked every year.  Diabetes- Have your blood sugar checked every year if you have high blood pressure, high cholesterol, a family history of diabetes or if you are overweight.  Pap Test - Have a pap test every 1 to 5 years if you have been sexually active.  If you are older than 65 and recent pap tests have been normal you may not need additional pap tests.  In addition, if you have had a hysterectomy  for benign disease additional pap tests are not necessary.  Mammogram-Yearly mammograms are essential for early detection of breast cancer  Screening for Colon Cancer- Colonoscopy starting at age 58. Screening may begin sooner depending on your family history and other health conditions.  Follow up colonoscopy as directed by your Gastroenterologist.  Screening for Osteoporosis- Screening begins at age 52 with bone density scanning, sooner if you are at higher risk for developing Osteoporosis.  Get these medicines  Calcium with Vitamin D- Your body requires 1200-1500 mg of Calcium a day and [phone] IU of Vitamin D a day.  You can only absorb 500 mg of Calcium at a time therefore Calcium must be taken in 2 or 3 separate doses throughout the day.  Hormones- Hormone therapy has been associated with increased risk for  certain cancers and heart disease.  Talk to your healthcare provider about if you need relief from menopausal symptoms.  Aspirin- Ask your healthcare provider about taking Aspirin to prevent Heart Disease and Stroke.  Get these Immuniztions  Flu shot- Every fall  Pneumonia shot- Once after the age of 35; if you are younger ask your healthcare provider if you need a pneumonia shot.  Tetanus- Every ten years.  Zostavax- Once after the age of 64 to prevent shingles.  Take these steps  Don't smoke- Your healthcare provider can help you quit. For tips on how to quit, ask your healthcare provider or go to www.smokefree.gov or call 1-800 QUIT-NOW.  Be physically active- Exercise 5 days a week for a minimum of 30 minutes.  If you are not already physically active, start slow and gradually work up to 30 minutes of moderate physical activity.  Try walking, dancing, bike riding, swimming, etc.  Eat a healthy diet- Eat a variety of healthy foods such as fruits, vegetables, whole grains, low fat milk, low fat cheeses, yogurt, lean meats, chicken, fish, eggs, dried beans, tofu, etc.  For more information go to www.thenutritionsource.org  Dental visit- Brush and floss teeth twice daily; visit your dentist twice a year.  Eye exam- Visit your Optometrist or Ophthalmologist yearly.  Drink alcohol in moderation- Limit alcohol intake to one drink or less a day.  Never drink and drive.  Depression- Your emotional health is as important as your physical health.  If you're feeling down or  losing interest in things you normally enjoy, please talk to your healthcare provider.  Seat Belts- can save your life; always wear one  Smoke/Carbon Monoxide detectors- These detectors need to be installed on the appropriate level of your home.  Replace batteries at least once a year.  Violence- If anyone is threatening or hurting you, please tell your healthcare provider.  Living Will/ Health care power of attorney-  Discuss with your healthcare provider and family.

## 2017-07-24 NOTE — Addendum Note (Signed)
Addended by: Tyson Dense E on: 07/24/2017 09:45 AM   Modules accepted: Orders

## 2017-07-24 NOTE — Patient Instructions (Signed)
[name] , Thank you for taking time to come for your Medicare Wellness Visit. I appreciate your ongoing commitment to your health goals. Please review the following plan we discussed and let me know if I can assist you in the future.   Screening recommendations/referrals: Colonoscopy up to date, due 09/05/2025 Mammogram due, referral sent Bone Density due, referral sent Recommended yearly ophthalmology/optometry visit for glaucoma screening and checkup Recommended yearly dental visit for hygiene and checkup  Vaccinations: Influenza vaccine up to date, due 2019 fall season Pneumococcal vaccine up to date, completed Tdap vaccine up to date, due 08/17/2020 Shingles vaccine due, declined    Advanced directives: In chart  Conditions/risks identified: none  Next appointment: Tyson Dense, RN 08/06/2018 @ 9:15am   Preventive Care 65 Years and Older, Female Preventive care refers to lifestyle choices and visits with your health care provider that can promote health and wellness. What does preventive care include?  A yearly physical exam. This is also called an annual well check.  Dental exams once or twice a year.  Routine eye exams. Ask your health care provider how often you should have your eyes checked.  Personal lifestyle choices, including:  Daily care of your teeth and gums.  Regular physical activity.  Eating a healthy diet.  Avoiding tobacco and drug use.  Limiting alcohol use.  Practicing safe sex.  Taking low-dose aspirin every day.  Taking vitamin and mineral supplements as recommended by your health care provider. What happens during an annual well check? The services and screenings done by your health care provider during your annual well check will depend on your age, overall health, lifestyle risk factors, and family history of disease. Counseling  Your health care provider may ask you questions about your:  Alcohol use.  Tobacco use.  Drug  use.  Emotional well-being.  Home and relationship well-being.  Sexual activity.  Eating habits.  History of falls.  Memory and ability to understand (cognition).  Work and work Statistician.  Reproductive health. Screening  You may have the following tests or measurements:  Height, weight, and BMI.  Blood pressure.  Lipid and cholesterol levels. These may be checked every 5 years, or more frequently if you are over 72 years old.  Skin check.  Lung cancer screening. You may have this screening every year starting at age 67 if you have a 30-pack-year history of smoking and currently smoke or have quit within the past 15 years.  Fecal occult blood test (FOBT) of the stool. You may have this test every year starting at age 60.  Flexible sigmoidoscopy or colonoscopy. You may have a sigmoidoscopy every 5 years or a colonoscopy every 10 years starting at age 47.  Hepatitis C blood test.  Hepatitis B blood test.  Sexually transmitted disease (STD) testing.  Diabetes screening. This is done by checking your blood sugar (glucose) after you have not eaten for a while (fasting). You may have this done every 1-3 years.  Bone density scan. This is done to screen for osteoporosis. You may have this done starting at age 56.  Mammogram. This may be done every 1-2 years. Talk to your health care provider about how often you should have regular mammograms. Talk with your health care provider about your test results, treatment options, and if necessary, the need for more tests. Vaccines  Your health care provider may recommend certain vaccines, such as:  Influenza vaccine. This is recommended every year.  Tetanus, diphtheria, and acellular pertussis (Tdap,  Td) vaccine. You may need a Td booster every 10 years.  Zoster vaccine. You may need this after age 50.  Pneumococcal 13-valent conjugate (PCV13) vaccine. One dose is recommended after age 66.  Pneumococcal polysaccharide  (PPSV23) vaccine. One dose is recommended after age 37. Talk to your health care provider about which screenings and vaccines you need and how often you need them. This information is not intended to replace advice given to you by your health care provider. Make sure you discuss any questions you have with your health care provider. Document Released: 04/29/2015 Document Revised: 12/21/2015 Document Reviewed: 02/01/2015 Elsevier Interactive Patient Education  2017 [place] Prevention in the Home Falls can cause injuries. They can happen to people of all ages. There are many things you can do to make your home safe and to help prevent falls. What can I do on the outside of my home?  Regularly fix the edges of walkways and driveways and fix any cracks.  Remove anything that might make you trip as you walk through a door, such as a raised step or threshold.  Trim any bushes or trees on the path to your home.  Use bright outdoor lighting.  Clear any walking paths of anything that might make someone trip, such as rocks or tools.  Regularly check to see if handrails are loose or broken. Make sure that both sides of any steps have handrails.  Any raised decks and porches should have guardrails on the edges.  Have any leaves, snow, or ice cleared regularly.  Use sand or salt on walking paths during winter.  Clean up any spills in your garage right away. This includes oil or grease spills. What can I do in the bathroom?  Use night lights.  Install grab bars by the toilet and in the tub and shower. Do not use towel bars as grab bars.  Use non-skid mats or decals in the tub or shower.  If you need to sit down in the shower, use a plastic, non-slip stool.  Keep the floor dry. Clean up any water that spills on the floor as soon as it happens.  Remove soap buildup in the tub or shower regularly.  Attach bath mats securely with double-sided non-slip rug tape.  Do not have  throw rugs and other things on the floor that can make you trip. What can I do in the bedroom?  Use night lights.  Make sure that you have a light by your bed that is easy to reach.  Do not use any sheets or blankets that are too big for your bed. They should not hang down onto the floor.  Have a firm chair that has side arms. You can use this for support while you get dressed.  Do not have throw rugs and other things on the floor that can make you trip. What can I do in the kitchen?  Clean up any spills right away.  Avoid walking on wet floors.  Keep items that you use a lot in easy-to-reach places.  If you need to reach something above you, use a strong step stool that has a grab bar.  Keep electrical cords out of the way.  Do not use floor polish or wax that makes floors slippery. If you must use wax, use non-skid floor wax.  Do not have throw rugs and other things on the floor that can make you trip. What can I do with my stairs?  Do not  leave any items on the stairs.  Make sure that there are handrails on both sides of the stairs and use them. Fix handrails that are broken or loose. Make sure that handrails are as long as the stairways.  Check any carpeting to make sure that it is firmly attached to the stairs. Fix any carpet that is loose or worn.  Avoid having throw rugs at the top or bottom of the stairs. If you do have throw rugs, attach them to the floor with carpet tape.  Make sure that you have a light switch at the top of the stairs and the bottom of the stairs. If you do not have them, ask someone to add them for you. What else can I do to help prevent falls?  Wear shoes that:  Do not have high heels.  Have rubber bottoms.  Are comfortable and fit you well.  Are closed at the toe. Do not wear sandals.  If you use a stepladder:  Make sure that it is fully opened. Do not climb a closed stepladder.  Make sure that both sides of the stepladder are  locked into place.  Ask someone to hold it for you, if possible.  Clearly mark and make sure that you can see:  Any grab bars or handrails.  First and last steps.  Where the edge of each step is.  Use tools that help you move around (mobility aids) if they are needed. These include:  Canes.  Walkers.  Scooters.  Crutches.  Turn on the lights when you go into a dark area. Replace any light bulbs as soon as they burn out.  Set up your furniture so you have a clear path. Avoid moving your furniture around.  If any of your floors are uneven, fix them.  If there are any pets around you, be aware of where they are.  Review your medicines with your doctor. Some medicines can make you feel dizzy. This can increase your chance of falling. Ask your doctor what other things that you can do to help prevent falls. This information is not intended to replace advice given to you by your health care provider. Make sure you discuss any questions you have with your health care provider. Document Released: 01/27/2009 Document Revised: 09/08/2015 Document Reviewed: 05/07/2014 Elsevier Interactive Patient Education  2017 [employer].

## 2017-07-24 NOTE — Progress Notes (Signed)
Patient ID: Sierra Luhn V., female   DOB: 10-Mar-1946, 72 y.o.   MRN: [id number]   Location:  [facility]  Place of Service:  OFFICE  Provider: Arletha Grippe, DO  Patient Care Team: Gildardo Cranker, DO as PCP - General (Internal Medicine) Dorothy Spark, MD as Consulting Physician (Cardiology)  Extended Emergency Contact Information Primary Emergency Contact: [name],Mark Address: [address], [place] Phone: [phone] Relation: Son  Code Status: FULL CODE Goals of Care: Advanced Directive information Advanced Directives 07/24/2017  Does Patient Have a Medical Advance Directive? Yes  Type of Advance Directive Living will  Does patient want to make changes to medical advance directive? No - Patient declined  Copy of [place] in Chart? -     Chief Complaint  Patient presents with  . Annual Exam    Yearly exam, AWV completed today, MMSE 28/30-passed clock drawing, EKG up to date, - depression, and - fall. Discuss labs(copy given to patient). Patient will collect urine sample today for MALB   . Advance Care Planning    Living Will   . Medication Refill    Test Strips-[employer]     HPI: Patient is a 72 y.o. female seen in today for a comprehensive exam. She reports feeling better. She continues to have pelvic pain. She saw GYN and w/u neg. She has external hemorrhoids that are uncomfortable. Last colonoscopy by [name]  in 2017 and polyps removed. Repeat due in 2022. She is a poor historian due to memory loss. Hx obtained from chart.  She has 2 week cardiac monitor ordered by Dr [name]. 2D echo showed no acute process.  GERD - bloating/burning resolved with prn dexilant. She has gas and indigestion. She saw GI and had EGD and colonoscopy by [name] in May 2017. EGD showed gastritis and was Rx omeprazole (ineffective) -->dexilant (expensive). She has been unable to afford dexilant and has been using samples. She is  not taking carafate due to cost and unknown reaction. Colonoscopy revealed multiple benign polyps and diverticulosis and she was told to repeat in 5 yrs (due 2022). She takes miralax prn. Needs samples of dexilant  short term memory loss -  x several yrs. Unchanged. She declines any meds  Depressed feelings - she does not want to take any meds. She been to counseling in the past. No SI/HI. She has anhedonia and increased fatigue. She has taken zoloft and wellbutrin in the past with success.  Palpitations - ECG revealed PACs at cardio office in 2015. Takes metoprolol which helps. She occasionally has to take a whole tablet when sx's really severe.  HTN - BP stable on amlodipine.  Chronic pain - thoracic, lower back and b/l LE. She has had EMG/NCS in the past. She has never seen a neurologist. Has seen rheum in the past but that provider ([name]) is no longer in the area. Takes diclofenac prn. Pain in her knees very severe. She does take aleve  DM - takes metformin XR 570m 2 tabs BID. BS 80-90s at home. Rarely >100. A1c 6.6%. She has neuropathy and takes gabapentin. Numbness in feet/hands stable. She takes statin. LDL 80  Hyperlipidemia - stable on crestor. LDL 80. Lipitor, zocor, pravachol caused myalgias. She does have some leg cramps but tolerable  Asthma/seasonal allergy - seasonal wheezing. Occasional dry cough. Takes flonase. She takes claritin qhs. She does have post nasal drip  Cataracts - followed by eye specialist [name]. She cancelled her cataract sx due to fear of having it and no transportation    Depression screen [facility] 2/9 07/24/2017 07/24/2017 01/23/2017 07/09/2016 07/03/2016  Decreased Interest 0 0 0 2 2  Down, Depressed, Hopeless 0 0 0 2 1  PHQ - 2 Score 0 0 0 4 3  Altered sleeping - - - 3 3  Tired, decreased energy - - - 3 1  Change in appetite - - - 3 3  Feeling bad or failure about yourself  - - - 0 0  Trouble concentrating - - - 1 0  Moving slowly or fidgety/restless  - - - 1 0  Suicidal thoughts - - - 0 0  PHQ-9 Score - - - 15 10  Difficult doing work/chores - - - Somewhat difficult Somewhat difficult    Fall Risk  07/24/2017 07/24/2017 06/26/2017 01/23/2017 07/09/2016  Falls in the past year? _0    MMSE - Mini Mental State Exam 07/24/2017 07/03/2016 07/01/2015  Not completed: - - (No Data)  Orientation to time _1 Orientation to Place _2 Registration _3 Attention/ Calculation _4 Recall _5 Language- name 2 objects _6 Language- repeat _7 Language- follow 3 step command _8 Language- read & follow direction _9 Write a sentence _10 Copy design _11 Total score _12 Health Maintenance  Topic Date Due  . OPHTHALMOLOGY EXAM  04/23/1957  . URINE MICROALBUMIN  07/03/2017  . MAMMOGRAM  04/16/2018 (Originally 08/09/2016)  . Hepatitis C Screening  04/16/2018 (Originally [birth year])  . INFLUENZA VACCINE  11/14/2017  . HEMOGLOBIN A1C  01/18/2018  . FOOT EXAM  01/23/2018  . TETANUS/TDAP  08/17/2020  . COLONOSCOPY  09/05/2025  . DEXA SCAN  Completed  . PNA vac Low Risk Adult  Completed    Past Medical History:  Diagnosis Date  . Abnormal Pap smear 05/08/2011   ASC-US +HPV  . Arthritis   . Asthma   . Diabetes mellitus type 2 with neurological manifestations ([place])   . Diabetic peripheral neuropathy ([place])   . Generalized osteoarthritis   . GERD (gastroesophageal reflux disease)   . Hx of seasonal allergies   . Hyperlipidemia   . Hypertension   . IBS (irritable bowel syndrome)   . S/P total hysterectomy 06/21/2011    Past Surgical History:  Procedure Laterality Date  . OVARIAN CYST SURGERY  1986  . PELVIC LAPAROSCOPY    . TONSILECTOMY, ADENOIDECTOMY, BILATERAL MYRINGOTOMY AND TUBES  1974  . VAGINAL HYSTERECTOMY  1983    Family History  Problem Relation Age of Onset  . Diabetes Father   . Heart disease Father   . Diabetes Mother   . Cancer Mother        colon   . Pancreatic cancer  Brother   . Pancreatic cancer Sister   . Cancer Paternal Grandmother        liver   . Breast cancer Cousin    Family Status  Relation Name Status  . Father  Deceased  . Mother  Deceased  . Brother  Deceased  . Sister  Deceased  . Son  Alive  . Daughter  Alive  . Daughter  Alive  . Daughter  Alive  . MGM  Deceased  .  MGF  Deceased  . PGM  Deceased  . PGF  Deceased  . Cousin  (Not Specified)    shoulder  Social History   Socioeconomic History  . Marital status: Single    Spouse name: Not on file  . Number of children: 4  . Years of education: Not on file  . Highest education level: Not on file  Occupational History    Employer: RETIRED  Social Needs  . Financial resource strain: Not hard at all  . Food insecurity:    Worry: Never true    Inability: Never true  . Transportation needs:    Medical: No    Non-medical: No  Tobacco Use  . Smoking status: Never Smoker  . Smokeless tobacco: Never Used  Substance and Sexual Activity  . Alcohol use: No  . Drug use: No  . Sexual activity: Not Currently    Birth control/protection: Surgical    Comment: 1st intercourse- 20, partners- 8,   Lifestyle  . Physical activity:    Days per week: 3 days    Minutes per session: 50 min  . Stress: Not at all  Relationships  . Social connections:    Talks on phone: More than three times a week    Gets together: More than three times a week    Attends religious service: More than 4 times per year    Active member of club or organization: No    Attends meetings of clubs or organizations: Never    Relationship status: Never married  . Intimate partner violence:    Fear of current or ex partner: No    Emotionally abused: No    Physically abused: No    Forced sexual activity: No  Other Topics Concern  . Not on file  Social History Narrative   Diet?    Low fat, low carb, low salt      Do you drink/eat things with caffeine?      Marital status?  single                                   What year were you married?      Do you live in a house, apartment, assisted living, condo, trailer, etc.?      Is it one or more stories?      How many persons live in your home?      Do you have any pets in your home? (please list)      Current or past profession:      Do you exercise?   yes                 Type & how often? Yoga, water aerobics 1-2x/week      Do you have a living will?      Do you have a DNR form?                                  If not, do you want to discuss one?      Do you have signed POA/HPOA for forms?        Allergies  Allergen Reactions  . Aspirin   . Pravastatin Sodium Other (See Comments)    Muscle aches    Allergies as of 07/24/2017      Reactions   Aspirin  Pravastatin Sodium Other (See Comments)   Muscle aches      Medication List        Accurate as of 07/24/17 10:00 AM. Always use your most recent med list.          ACCU-CHEK AVIVA PLUS w/Device Kit Use to test blood sugar twice daily. Dx E11.42   ACCU-CHEK SOFTCLIX LANCETS lancets USE  TO TEST BLOOD SUGAR ONE TIME DAILY FOR DIABETES   ALIGN 4 MG Caps Take 1 capsule by mouth daily.   amLODipine 5 MG tablet Commonly known as:  NORVASC Take 5 mg by mouth daily.   B-D SINGLE USE SWABS REGULAR Pads As directed for diabetes one daily to test blood E11.42   cholecalciferol 1000 units tablet Commonly known as:  VITAMIN D Take 1,000 Units by mouth daily.   Fish Oil 1000 MG Caps Take 2 capsules by mouth daily.   fluticasone 50 MCG/ACT nasal spray Commonly known as:  FLONASE Place 2 sprays into both nostrils 2 (two) times daily.   gabapentin 400 MG capsule Commonly known as:  NEURONTIN Take 400 mg by mouth 2 (two) times daily.   glucose blood test strip Commonly known as:  ACCU-CHEK AVIVA PLUS Use to test blood sugar twice daily. Dx: E11.42   loratadine 10 MG tablet Commonly known as:  CLARITIN Take 10 mg by mouth daily.   metFORMIN 500 MG 24 hr  tablet Commonly known as:  GLUCOPHAGE-XR Take 1,000 mg by mouth 2 (two) times daily.   metoprolol tartrate 25 MG tablet Commonly known as:  LOPRESSOR Take 0.5 tablets (12.5 mg total) by mouth 2 (two) times daily.   ICAPS AREDS FORMULA PO Take 1 capsule by mouth daily.   multivitamin with minerals tablet Take 1 tablet by mouth daily.   OVER THE COUNTER MEDICATION Place 1 drop into both eyes 3 (three) times daily as needed. For dry eyes. Equate Brand Restore Tears Lubricant Eye Drops   polyethylene glycol packet Commonly known as:  MIRALAX / GLYCOLAX Take 17 g by mouth daily as needed. For constipation   rosuvastatin 5 MG tablet Commonly known as:  CRESTOR Take 5 mg by mouth daily.        Review of Systems:  Review of Systems  Unable to perform ROS: Other (memory loss)    Physical Exam: Vitals:   07/24/17 0911  BP: 116/64  Pulse: 71  Temp: (!) 97.1 F (36.2 C)  TempSrc: Oral  SpO2: 98%  Weight: 154 lb (69.9 kg)  Height: _0  (1.651 m)   Body mass index is 25.63 kg/m. Physical Exam  Constitutional: She is oriented to person, place, and time. She appears well-developed and well-nourished. No distress.  HENT:  Head: Normocephalic and atraumatic.  Right Ear: External ear normal.  Left Ear: External ear normal.  Mouth/Throat: Oropharynx is clear and moist. No oropharyngeal exudate.  MMM; no oral thrush  Eyes: Pupils are equal, round, and reactive to light. EOM are normal. No scleral icterus.  Neck: Normal range of motion. Neck supple. Carotid bruit is not present. No tracheal deviation present. No thyromegaly present.  Cardiovascular: Normal rate, regular rhythm and intact distal pulses. Exam reveals no gallop and no friction rub.  Murmur (1/6 SEM) heard. No LE edema b/l. No calf TTP  Pulmonary/Chest: Effort normal and breath sounds normal. No respiratory distress. She has no wheezes. She has no rales. She exhibits no tenderness. Right breast exhibits no  inverted nipple, no mass, no nipple discharge, no skin  change and no tenderness. Left breast exhibits no inverted nipple, no mass, no nipple discharge, no skin change and no tenderness. Breasts are symmetrical.  No rhonchi  Abdominal: Soft. Bowel sounds are normal. She exhibits no distension and no mass. There is no hepatosplenomegaly or hepatomegaly. There is tenderness. There is no rebound and no guarding. No hernia.  obese  Genitourinary: Rectal exam shows external hemorrhoid, internal hemorrhoid (small) and tenderness. Rectal exam shows no fissure, no mass and anal tone normal.     Musculoskeletal: She exhibits edema and tenderness (palpable round TTP 10 cent sized cyst with no redness on plantar surface left). She exhibits no deformity.  Lymphadenopathy:    She has no cervical adenopathy.  Neurological: She is alert and oriented to person, place, and time. She has normal reflexes.  Skin: Skin is warm and dry. No rash noted.  Psychiatric: She has a normal mood and affect. Her behavior is normal. Judgment and thought content normal.  Vitals reviewed.   Labs reviewed:  Basic Metabolic Panel: Recent Labs    01/23/17 0957 07/19/17 0804  NA 141 143  K 4.5 4.2  CL 104 107  CO2 31 30  GLUCOSE 105* 105*  BUN 17 15  CREATININE 0.81 0.76  CALCIUM 9.6 9.1  TSH  --  0.99   Liver Function Tests: Recent Labs    01/23/17 0957 07/19/17 0804  AST  --  14  ALT 12 11  BILITOT  --  0.4  PROT  --  6.3   No results for input(s): LIPASE, AMYLASE in the last 8760 hours. No results for input(s): AMMONIA in the last 8760 hours. CBC: Recent Labs    07/19/17 0804  WBC 5.2  NEUTROABS 1,700  HGB 13.1  HCT 38.1  MCV 83.9  PLT 166   Lipid Panel: Recent Labs    01/23/17 0957 07/19/17 0804  CHOL 162 124  HDL 67 52  LDLCALC 71 51  TRIG 160* 126  CHOLHDL 2.4 2.4   Lab Results  Component Value Date   HGBA1C 6.6 (H) 07/19/2017    Procedures: US Pelvic Complete With  Transvaginal  Result Date: 07/17/2017 CLINICAL DATA:  Pelvic pain in a female EXAM: TRANSABDOMINAL AND TRANSVAGINAL ULTRASOUND OF PELVIS TECHNIQUE: Both transabdominal and transvaginal ultrasound examinations of the pelvis were performed. Transabdominal technique was performed for global imaging of the pelvis including uterus, ovaries, adnexal regions, and pelvic cul-de-sac. It was necessary to proceed with endovaginal exam following the transabdominal exam to visualize the ovaries and adnexa. COMPARISON:  None FINDINGS: Uterus Surgically absent Endometrium N/A Right ovary Measurements: 3.1 x 2.0 x 2.3 cm.  Normal morphology without mass Left ovary Measurements: 2.3 x 1.2 x 1.2 cm.  Normal morphology without mass Other findings No free pelvic fluid or adnexal masses. IMPRESSION: Post hysterectomy. Unremarkable ovaries. No acute abnormalities. Electronically Signed   By: Lavonia Dana M.D.   On: 07/17/2017 16:18    Assessment/Plan   ICD-10-CM   1. Well adult exam Z00.00   2. Hemorrhoids, unspecified hemorrhoid type K64.9 hydrocortisone (PROCTOSOL HC) 2.5 % rectal cream   with abrasion at anus  3. Type 2 diabetes mellitus with diabetic polyneuropathy, without long-term current use of insulin (H[facility]) E11.42 glucose blood (ACCU-CHEK AVIVA PLUS) test strip    Microalbumin/Creatinine Ratio, Urine  4. Hyperlipidemia LDL goal <100 E78.5   5. Essential hypertension I10   6. Chronic seasonal allergic rhinitis due to pollen J30.1   7. Bladder pain R39.89   8. High  risk medication use Z79.899   9. Ganglion cyst of left foot M67.472     START PROCTOSOL CREAM 2 TIMES DAILY TO RECTAL AREA  Will call with urine results  Recommend podiatry or orthopedic eval for left foot painful cyst  Continue current medications as ordered  Follow up with specialists as scheduled  Follow up in 6 mos for DM, HTN, bladder pain  Keeping You Healthy handout given    Cordella Register. Perlie Gold  [facility] [address] [place], [place] [address] [phone] Cell (Monday-Friday 8 AM - 5 PM) [phone] After 5 PM and follow prompts

## 2017-07-25 LAB — MICROALBUMIN / CREATININE URINE RATIO
Creatinine, Urine: 102 mg/dL (ref 20–275)
Microalb Creat Ratio: 6 mcg/mg creat (ref <30)
Microalb, Ur: 0.6 mg/dL

## 2017-08-08 ENCOUNTER — Telehealth: Payer: Self-pay | Admitting: *Deleted

## 2017-08-08 MED ORDER — METOPROLOL TARTRATE 25 MG PO TABS: 25.0000 mg | ORAL_TABLET | Freq: Two times a day (BID) | ORAL | 3 refills | Status: DC

## 2017-08-08 NOTE — Telephone Encounter (Signed)
-----   Message from Consuelo Pandy, [place] sent at 08/07/2017  3:28 PM EDT ----- Dr. [name] has reviewed cardiac monitor results. It showed PACs. Theses are benign but can cause palpitations. There were   worrisome arrthymias. Dr. [name] recommended that we consider increasing metoprolol to 25 mg BID, but if she is still having issues with low normal BP and low normal heart rate, then she can continue to take 12.5 BID and take an extra half tablet if needed. This is what we discussed at her last OV.

## 2017-08-14 ENCOUNTER — Telehealth: Payer: Self-pay | Admitting: Cardiology

## 2017-08-14 MED ORDER — METOPROLOL TARTRATE 25 MG PO TABS: 12.5000 mg | ORAL_TABLET | Freq: Two times a day (BID) | ORAL | 3 refills | Status: DC

## 2017-08-14 NOTE — Telephone Encounter (Signed)
Note    ----- Message from Consuelo Pandy, PA-C sent at 08/07/2017  3:28 PM EDT ----- Dr. [name] has reviewed cardiac monitor results. It showed PACs. Theses are benign but can cause palpitations. There were   worrisome arrthymias. Dr. [name] recommended that we consider increasing metoprolol to 25 mg BID, but if she is still having issues with low normal BP and low normal heart rate, then she can continue to take 12.5 BID and take an extra half tablet if needed. This is what we discussed at her last OV.         Pt is calling to let Ellen Henri PA-C and Dr [name] know that she went back to her original dose of Metoprolol tartrate 12.5 mg po BID, for the 25 mg bid strength was causing her to have Low normal BPs and made her feel dizzy. Pt states she is aware that if she feels any palpitations, she can take an extra 1/2 tab PRN for this, and as advised by [place] (as mentioned above). Pt states she feels much better taking the 12.5 mg po bid.  Advised the pt that she needs to be mindful of her hydration, and drink more water when she feels her pressures are low normal, and feels dizzy after administration of metoprolol. Advised the pt that if she has any further concerns, or abnormal values, she can send them through mychart for Dr [name] and Ellen Henri PA-C to review. Pt verbalized understanding and agrees with this plan. Will forward this message to Dr [name] and Ellen Henri PA-C as an general FYI. Reflected dose change in pts med list.

## 2017-08-14 NOTE — Telephone Encounter (Signed)
Pt c/o BP issue:  1. What are your last 5 BP readings?  5.1.19  108/57 before her medication this morning 5.1.19   99/53 after her medication this morning 2. Are you having any other symptoms (ex. Dizziness, headache, blurred vision, passed out)? Dizziness and faintly  3. What is your medication issue? Want to know how she need to take  Her medication

## 2017-09-04 DIAGNOSIS — H04123 Dry eye syndrome of bilateral lacrimal glands: Secondary | ICD-10-CM | POA: Diagnosis not present

## 2017-09-04 DIAGNOSIS — H25813 Combined forms of age-related cataract, bilateral: Secondary | ICD-10-CM | POA: Diagnosis not present

## 2017-09-04 DIAGNOSIS — H40013 Open angle with borderline findings, low risk, bilateral: Secondary | ICD-10-CM | POA: Diagnosis not present

## 2017-09-04 DIAGNOSIS — E119 Type 2 diabetes mellitus without complications: Secondary | ICD-10-CM | POA: Diagnosis not present

## 2017-09-04 LAB — HM DIABETES EYE EXAM

## 2017-09-10 ENCOUNTER — Other Ambulatory Visit: Payer: Self-pay | Admitting: Internal Medicine

## 2017-09-10 DIAGNOSIS — Z1231 Encounter for screening mammogram for malignant neoplasm of breast: Secondary | ICD-10-CM

## 2017-09-19 ENCOUNTER — Telehealth: Payer: Self-pay | Admitting: *Deleted

## 2017-09-19 DIAGNOSIS — J302 Other seasonal allergic rhinitis: Secondary | ICD-10-CM

## 2017-09-19 NOTE — Telephone Encounter (Signed)
Patient called and stated that her Allergies are no better. Stated that she still has dry cough, scratchy throat and Ears bother her. Using spray with no relief. Patient is requesting referral to ENT Dr. Radene Journey. Please Advise.

## 2017-09-19 NOTE — Telephone Encounter (Signed)
Ok for ENT referral for seasonal allergy sx's

## 2017-09-19 NOTE — Telephone Encounter (Signed)
Referral placed for ENT

## 2017-09-24 DIAGNOSIS — J3501 Chronic tonsillitis: Secondary | ICD-10-CM | POA: Diagnosis not present

## 2017-09-24 DIAGNOSIS — J029 Acute pharyngitis, unspecified: Secondary | ICD-10-CM | POA: Diagnosis not present

## 2017-09-30 ENCOUNTER — Ambulatory Visit
Admission: RE | Admit: 2017-09-30 | Discharge: 2017-09-30 | Disposition: A | Discharge status: Home / Self Care | Payer: Medicare HMO | Source: Ambulatory Visit | Attending: Internal Medicine | Admitting: Internal Medicine

## 2017-09-30 DIAGNOSIS — Z1231 Encounter for screening mammogram for malignant neoplasm of breast: Secondary | ICD-10-CM | POA: Diagnosis not present

## 2017-10-02 ENCOUNTER — Encounter: Payer: Self-pay | Admitting: *Deleted

## 2017-12-04 ENCOUNTER — Encounter: Payer: Self-pay | Admitting: Internal Medicine

## 2017-12-19 ENCOUNTER — Other Ambulatory Visit: Payer: Self-pay | Admitting: Internal Medicine

## 2017-12-19 DIAGNOSIS — E1142 Type 2 diabetes mellitus with diabetic polyneuropathy: Secondary | ICD-10-CM

## 2017-12-25 DIAGNOSIS — H00021 Hordeolum internum right upper eyelid: Secondary | ICD-10-CM | POA: Diagnosis not present

## 2018-01-14 HISTORY — PX: EYE SURGERY: SHX253

## 2018-01-16 ENCOUNTER — Telehealth: Payer: Self-pay | Admitting: *Deleted

## 2018-01-16 DIAGNOSIS — M79672 Pain in left foot: Secondary | ICD-10-CM

## 2018-01-16 NOTE — Telephone Encounter (Signed)
Ok to refer for foot pain

## 2018-01-16 NOTE — Telephone Encounter (Signed)
Referral placed.

## 2018-01-16 NOTE — Telephone Encounter (Signed)
Patient called requesting referral to be placed for Dr. Regal-Podiatrist for Left foot discomfort and for a feet evaluation.Patient stated that she spoke with Dr. Eulas Post at last appointment about the discomfort.  Would like to go to Dr. Paulla Dolly. Please Advise.

## 2018-01-21 DIAGNOSIS — H00021 Hordeolum internum right upper eyelid: Secondary | ICD-10-CM | POA: Diagnosis not present

## 2018-01-21 DIAGNOSIS — H25812 Combined forms of age-related cataract, left eye: Secondary | ICD-10-CM | POA: Diagnosis not present

## 2018-01-21 DIAGNOSIS — H2511 Age-related nuclear cataract, right eye: Secondary | ICD-10-CM | POA: Diagnosis not present

## 2018-01-22 ENCOUNTER — Ambulatory Visit: Payer: Medicare HMO | Admitting: Podiatry

## 2018-01-28 ENCOUNTER — Ambulatory Visit (INDEPENDENT_AMBULATORY_CARE_PROVIDER_SITE_OTHER): Payer: Medicare HMO | Admitting: Internal Medicine

## 2018-01-28 ENCOUNTER — Encounter: Payer: Self-pay | Admitting: Internal Medicine

## 2018-01-28 VITALS — BP 118/66 | HR 79 | Temp 98.4°F | Ht 65.0 in | Wt 159.0 lb

## 2018-01-28 DIAGNOSIS — I10 Essential (primary) hypertension: Secondary | ICD-10-CM

## 2018-01-28 DIAGNOSIS — I152 Hypertension secondary to endocrine disorders: Secondary | ICD-10-CM

## 2018-01-28 DIAGNOSIS — E1169 Type 2 diabetes mellitus with other specified complication: Secondary | ICD-10-CM | POA: Diagnosis not present

## 2018-01-28 DIAGNOSIS — E1142 Type 2 diabetes mellitus with diabetic polyneuropathy: Secondary | ICD-10-CM

## 2018-01-28 DIAGNOSIS — E785 Hyperlipidemia, unspecified: Secondary | ICD-10-CM | POA: Diagnosis not present

## 2018-01-28 DIAGNOSIS — E1159 Type 2 diabetes mellitus with other circulatory complications: Secondary | ICD-10-CM

## 2018-01-28 DIAGNOSIS — J301 Allergic rhinitis due to pollen: Secondary | ICD-10-CM

## 2018-01-28 DIAGNOSIS — Z23 Encounter for immunization: Secondary | ICD-10-CM

## 2018-01-28 NOTE — Progress Notes (Signed)
Patient ID: Sierra Deloney V., female   DOB: 1946-02-26, 72 y.o.   MRN: [id number]   Location:  [facility] OFFICE  Provider: DR Arletha Grippe  Code Status:  Goals of Care:  Advanced Directives 07/24/2017  Does Patient Have a Medical Advance Directive? Yes  Type of Advance Directive Living will  Does patient want to make changes to medical advance directive? No - Patient declined  Copy of [place] in Chart? -     Chief Complaint  Patient presents with  . Medical Management of Chronic Issues    6 month follow-up on DM, HTN, and bladder pain   . Head Injury    Hit head 3 weeks ago on cabinet, patient with HA's x a few days (resolved) and now feels like she is floating at times   . Immunizations    High Dose Flu Vaccine   . Health Maintenance    Will see Dr.Regal for DM foot exam tomorrow     HPI: Patient is a 72 y.o. female seen today for medical management of chronic diseases.  She hit her head on bathroom cabinet 3 weeks ago. No LOC. She initially had an HA but that resolved on its own. No overt dizziness. She has intermittent SOB and chest tightness but believes this is related to allergies.   She is scheduled to see podiatry tomorrow for diabetic foot exam.  GERD - bloating/burning resolved with prn dexilant. She has gas and indigestion. She saw GI and had EGD and colonoscopy by [name] in May 2017. EGD showed gastritis and was Rx omeprazole (ineffective) -->dexilant (expensive). She has been unable to afford dexilant and has been using samples. She is not taking carafate due to cost and unknown reaction. Colonoscopy revealed multiple benign polyps and diverticulosis and she was told to repeat in 5 yrs (due 2022). She takes miralax prn. Needs samples of dexilant  short term memory loss -  x several yrs. Unchanged. She declines any meds  Depressed feelings - she does not want to take any meds. She been to counseling in the past. No SI/HI. She has anhedonia and  increased fatigue. She has taken zoloft and wellbutrin in the past with success.  Palpitations - ECG revealed PACs at cardio office in 2015. Takes metoprolol which helps. She occasionally has to take a whole tablet when sx's really severe.  HTN - BP stable on amlodipine.  Chronic pain - thoracic, lower back and b/l LE. She has had EMG/NCS in the past. She has never seen a neurologist. Has seen rheum in the past but that provider ([name]) is no longer in the area. Takes diclofenac prn. Pain in her knees very severe. She does take aleve  DM - takes metformin XR 51m 2 tabs BID. BS 80-90s at home. Rarely >100. A1c 6.6%. She has neuropathy and takes gabapentin. Numbness in feet/hands stable. She takes statin. LDL 80  Hyperlipidemia - stable on crestor. LDL 51. Lipitor, zocor, pravachol caused myalgias. She does have some leg cramps but tolerable  Asthma/seasonal allergy - seasonal wheezing. Occasional dry cough. Takes flonase. She takes claritin qhs. She does have post nasal drip   Cataracts - followed by eye specialist [name] She cancelled her cataract sx due to fear of having it and no transportation     Past Medical History:  Diagnosis Date  . Abnormal Pap smear 05/08/2011   ASC-US +HPV  . Arthritis   . Asthma   . Diabetes mellitus type  2 with neurological manifestations ([name])   . Diabetic peripheral neuropathy ([name])   . Generalized osteoarthritis   . GERD (gastroesophageal reflux disease)   . Hx of seasonal allergies   . Hyperlipidemia   . Hypertension   . IBS (irritable bowel syndrome)   . S/P total hysterectomy 06/21/2011    Past Surgical History:  Procedure Laterality Date  . EYE SURGERY Right 01/2018   Removed raised area on eye. Dr.Groat   . OVARIAN CYST SURGERY  1986  . PELVIC LAPAROSCOPY    . TONSILECTOMY, ADENOIDECTOMY, BILATERAL MYRINGOTOMY AND TUBES  1974  . VAGINAL HYSTERECTOMY  1983     reports that she has never smoked. She has never used smokeless  tobacco. She reports that she does not drink alcohol or use drugs. Social History   Socioeconomic History  . Marital status: Single    Spouse name: Not on file  . Number of children: 4  . Years of education: Not on file  . Highest education level: Not on file  Occupational History    Employer: RETIRED  Social Needs  . Financial resource strain: Not hard at all  . Food insecurity:    Worry: Never true    Inability: Never true  . Transportation needs:    Medical: No    Non-medical: No  Tobacco Use  . Smoking status: Never Smoker  . Smokeless tobacco: Never Used  Substance and Sexual Activity  . Alcohol use: No  . Drug use: No  . Sexual activity: Not Currently    Birth control/protection: Surgical    Comment: 1st intercourse- 20, partners- 8,   Lifestyle  . Physical activity:    Days per week: 3 days    Minutes per session: 50 min  . Stress: Not at all  Relationships  . Social connections:    Talks on phone: More than three times a week    Gets together: More than three times a week    Attends religious service: More than 4 times per year    Active member of club or organization: No    Attends meetings of clubs or organizations: Never    Relationship status: Never married  . Intimate partner violence:    Fear of current or ex partner: No    Emotionally abused: No    Physically abused: No    Forced sexual activity: No  Other Topics Concern  . Not on file  Social History Narrative   Diet?    Low fat, low carb, low salt      Do you drink/eat things with caffeine?      Marital status?  single                                  What year were you married?      Do you live in a house, apartment, assisted living, condo, trailer, etc.?      Is it one or more stories?      How many persons live in your home?      Do you have any pets in your home? (please list)      Current or past profession:      Do you exercise?   yes                 Type & how often? Yoga, water  aerobics 1-2x/week      Do you have a  living will?      Do you have a DNR form?                                  If not, do you want to discuss one?      Do you have signed POA/HPOA for forms?        Family History  Problem Relation Age of Onset  . Diabetes Father   . Heart disease Father   . Diabetes Mother   . Cancer Mother        colon   . Pancreatic cancer Brother   . Pancreatic cancer Sister   . Cancer Paternal Grandmother        liver   . Breast cancer Cousin   . Breast cancer Maternal Aunt     Allergies  Allergen Reactions  . Aspirin   . Pravastatin Sodium Other (See Comments)    Muscle aches    Outpatient Encounter Medications as of 01/28/2018  Medication Sig  . ACCU-CHEK AVIVA PLUS test strip USE TO TEST BLOOD SUGAR TWICE DAILY  . ACCU-CHEK SOFTCLIX LANCETS lancets USE  TO TEST BLOOD SUGAR ONE TIME DAILY FOR DIABETES  . Alcohol Swabs (B-D SINGLE USE SWABS REGULAR) PADS As directed for diabetes one daily to test blood E11.42  . amLODipine (NORVASC) 5 MG tablet Take 5 mg by mouth daily.  . Blood Glucose Monitoring Suppl (ACCU-CHEK AVIVA PLUS) w/Device KIT Use to test blood sugar twice daily. Dx E11.42  . cholecalciferol (VITAMIN D) 1000 UNITS tablet Take 1,000 Units by mouth daily.   . fluticasone (FLONASE) 50 MCG/ACT nasal spray Place 2 sprays into both nostrils 2 (two) times daily.  Marland Kitchen gabapentin (NEURONTIN) 400 MG capsule Take 400 mg by mouth 2 (two) times daily.  . hydrocortisone (PROCTOSOL HC) 2.5 % rectal cream Place 1 application rectally 2 (two) times daily. For hemorrhoids  . loratadine (CLARITIN) 10 MG tablet Take 10 mg by mouth daily.  . metFORMIN (GLUCOPHAGE-XR) 500 MG 24 hr tablet Take 1,000 mg by mouth 2 (two) times daily.  . Multiple Vitamins-Minerals (ICAPS AREDS FORMULA PO) Take 1 capsule by mouth daily.   . Multiple Vitamins-Minerals (MULTIVITAMIN WITH MINERALS) tablet Take 1 tablet by mouth daily.  . Omega-3 Fatty Acids (FISH OIL) 1000 MG CAPS  Take 2 capsules by mouth daily.  Marland Kitchen OVER THE COUNTER MEDICATION Place 1 drop into both eyes 3 (three) times daily as needed. For dry eyes. Equate Brand Restore Tears Lubricant Eye Drops   . polyethylene glycol (MIRALAX / GLYCOLAX) packet Take 17 g by mouth daily as needed. For constipation  . Probiotic Product (ALIGN) 4 MG CAPS Take 1 capsule by mouth daily.   . rosuvastatin (CRESTOR) 5 MG tablet Take 5 mg by mouth daily.  . metoprolol tartrate (LOPRESSOR) 25 MG tablet Take 0.5 tablets (12.5 mg total) by mouth 2 (two) times daily. You may take an extra 1/2 tab as needed for palpitations.   No facility-administered encounter medications on file as of 01/28/2018.     Review of Systems:  Review of Systems  Respiratory: Positive for chest tightness and shortness of breath.   Cardiovascular: Positive for palpitations.  Neurological: Positive for numbness.  All other systems reviewed and are negative.   Health Maintenance  Topic Date Due  . INFLUENZA VACCINE  11/14/2017  . HEMOGLOBIN A1C  01/18/2018  . FOOT EXAM  01/23/2018  . Hepatitis C  Screening  04/16/2018 (Originally Apr 02, 1946)  . URINE MICROALBUMIN  07/25/2018  . OPHTHALMOLOGY EXAM  09/05/2018  . MAMMOGRAM  10/01/2019  . TETANUS/TDAP  08/17/2020  . COLONOSCOPY  09/05/2025  . DEXA SCAN  Completed  . PNA vac Low Risk Adult  Completed    Physical Exam: Vitals:   01/28/18 1414  BP: 118/66  Pulse: 79  Temp: 98.4 F (36.9 C)  TempSrc: Oral  SpO2: 97%  Weight: 159 lb (72.1 kg)  Height: '5\' 5"'  (1.651 m)   Body mass index is 26.46 kg/m. Physical Exam  Constitutional: She is oriented to person, place, and time. She appears well-developed and well-nourished.  HENT:  Head: Normocephalic and atraumatic.  Mouth/Throat: Oropharynx is clear and moist. No oropharyngeal exudate.  MMM; no oral thrush  Eyes: Pupils are equal, round, and reactive to light. No scleral icterus.  Neck: Neck supple. Carotid bruit is not present. No tracheal  deviation present. No thyromegaly present.  Cardiovascular: Normal rate, regular rhythm and intact distal pulses. Exam reveals no gallop and no friction rub.  Murmur (1/6 SEM) heard. Trace LE edema b/l; no calf TTP  Pulmonary/Chest: Effort normal and breath sounds normal. No stridor. No respiratory distress. She has no wheezes. She has no rales.  Abdominal: Soft. Normal appearance and bowel sounds are normal. She exhibits no distension and no mass. There is no hepatomegaly. There is no tenderness. There is no rigidity, no rebound and no guarding. No hernia.  Musculoskeletal: She exhibits edema.  Lymphadenopathy:    She has no cervical adenopathy.  Neurological: She is alert and oriented to person, place, and time. She has normal reflexes. No cranial nerve deficit.  Skin: Skin is warm and dry. No rash noted.  Psychiatric: She has a normal mood and affect. Her behavior is normal. Judgment and thought content normal.    Labs reviewed: Basic Metabolic Panel: Recent Labs    07/19/17 0804  NA 143  K 4.2  CL 107  CO2 30  GLUCOSE 105*  BUN 15  CREATININE 0.76  CALCIUM 9.1  TSH 0.99   Liver Function Tests: Recent Labs    07/19/17 0804  AST 14  ALT 11  BILITOT 0.4  PROT 6.3   No results for input(s): LIPASE, AMYLASE in the last 8760 hours. No results for input(s): AMMONIA in the last 8760 hours. CBC: Recent Labs    07/19/17 0804  WBC 5.2  NEUTROABS 1,700  HGB 13.1  HCT 38.1  MCV 83.9  PLT 166   Lipid Panel: Recent Labs    07/19/17 0804  CHOL 124  HDL 52  LDLCALC 51  TRIG 126  CHOLHDL 2.4   Lab Results  Component Value Date   HGBA1C 6.6 (H) 07/19/2017    Procedures since last visit: No results found.  Assessment/Plan   ICD-10-CM   1. Type 2 diabetes mellitus with diabetic polyneuropathy, without long-term current use of insulin (HCC) E11.42 CMP with eGFR([facility])    Lipid Panel    Hemoglobin A1c    CANCELED: CMP with eGFR([facility])    CANCELED: Lipid Panel      CANCELED: Hemoglobin A1c  2. Hyperlipidemia associated with type 2 diabetes mellitus (HCC) E11.69 Lipid Panel   E78.5 CANCELED: Lipid Panel  3. Hypertension associated with diabetes ([place]) E11.59    I10   4. Chronic seasonal allergic rhinitis due to pollen J30.1   5. Encounter for immunization Z23 Flu vaccine HIGH DOSE PF    High dose flu shot today  Continue  current medications as ordered  Follow up with specialists as scheduled  SCHEDULE FASTING LAB APPT NEXT AVAILABLE  Follow up in 6 mos for CPE/ECG with Sherrie Mustache, NP. Keep appt for AWV in April as scheduled     Shaylin Blatt S. Perlie Gold  [facility] [address] [place], [place] [address] [phone] Cell (Monday-Friday 8 AM - 5 PM) [phone] After 5 PM and follow prompts

## 2018-01-28 NOTE — Patient Instructions (Addendum)
High dose flu shot today  Continue current medications as ordered  Follow up with specialists as scheduled  SCHEDULE FASTING LAB APPT NEXT AVAILABLE  Follow up in 6 mos for CPE/ECG with Sherrie Mustache, NP. Keep appt for AWV in April as scheduled

## 2018-01-29 ENCOUNTER — Ambulatory Visit: Payer: Medicare HMO | Admitting: Podiatry

## 2018-01-29 ENCOUNTER — Encounter: Payer: Self-pay | Admitting: Internal Medicine

## 2018-01-29 ENCOUNTER — Ambulatory Visit (INDEPENDENT_AMBULATORY_CARE_PROVIDER_SITE_OTHER): Payer: Medicare HMO

## 2018-01-29 ENCOUNTER — Other Ambulatory Visit: Payer: Self-pay | Admitting: Podiatry

## 2018-01-29 ENCOUNTER — Encounter: Payer: Self-pay | Admitting: Podiatry

## 2018-01-29 DIAGNOSIS — M79672 Pain in left foot: Secondary | ICD-10-CM | POA: Diagnosis not present

## 2018-01-29 DIAGNOSIS — M722 Plantar fascial fibromatosis: Secondary | ICD-10-CM

## 2018-01-29 DIAGNOSIS — D169 Benign neoplasm of bone and articular cartilage, unspecified: Secondary | ICD-10-CM | POA: Diagnosis not present

## 2018-01-29 MED ORDER — TRIAMCINOLONE ACETONIDE 10 MG/ML IJ SUSP
10.0000 mg | Freq: Once | INTRAMUSCULAR | Status: AC
  Administered 2018-01-29: 10 mg

## 2018-01-29 NOTE — Progress Notes (Signed)
Subjective:   Patient ID: Sierra Waln V., female   DOB: 72 y.o.   MRN: [id number]   HPI Patient states she is developed a lot of pain in her left arch over the last 6 months and it seems like there was a lump there and it is not as bad but it still quite sore when she walks on it.  Patient does not smoke and likes to be active   Review of Systems  All other systems reviewed and are negative.       Objective:  Physical Exam  Constitutional: She appears well-developed and well-nourished.  Cardiovascular: Intact distal pulses.  Pulmonary/Chest: Effort normal.  Musculoskeletal: Normal range of motion.  Neurological: She is alert.  Skin: Skin is warm.  Nursing note and vitals reviewed.   Neurovascular status intact muscle strength is adequate range of motion within normal limits with patient found to have a nodule within the left arch that measures approximately 8 mm x 8 mm that is moderately tender when pressed and appears to be within the plantar fascia itself.  Patient is noted to have good digital perfusion and is well oriented x3     Assessment:  Probability for a plantar fibroma with fasciitis associated with left arch     Plan:  H&P education and x-ray reviewed and today I did a careful injection of the mid arch area left 3 mg Kenalog 500 Xylocaine instructed on heat therapy to try to shrink the area and if it remains enlarged or it is increasingly tender it will need to be excised which I discussed with her today.  Reappoint in the next 4 weeks or earlier if any issues should occur  X-rays indicate no signs of calcification currently with patient having mild exostotic bone spurs noted

## 2018-02-04 ENCOUNTER — Other Ambulatory Visit: Payer: Medicare HMO

## 2018-02-05 ENCOUNTER — Other Ambulatory Visit: Payer: Medicare HMO

## 2018-02-05 DIAGNOSIS — E785 Hyperlipidemia, unspecified: Secondary | ICD-10-CM

## 2018-02-05 DIAGNOSIS — E1169 Type 2 diabetes mellitus with other specified complication: Secondary | ICD-10-CM

## 2018-02-05 DIAGNOSIS — E1142 Type 2 diabetes mellitus with diabetic polyneuropathy: Secondary | ICD-10-CM | POA: Diagnosis not present

## 2018-02-06 LAB — COMPLETE METABOLIC PANEL WITH GFR
AG Ratio: 1.4 (calc) (ref 1.0–2.5)
ALT: 13 U/L (ref 6–29)
AST: 18 U/L (ref 10–35)
Albumin: 4.1 g/dL (ref 3.6–5.1)
Alkaline phosphatase (APISO): 49 U/L (ref 33–130)
BUN: 21 mg/dL (ref 7–25)
CO2: 28 mmol/L (ref 20–32)
Calcium: 9.2 mg/dL (ref 8.6–10.4)
Chloride: 103 mmol/L (ref 98–110)
Creat: 0.78 mg/dL (ref 0.60–0.93)
GFR, Est African American: 89 mL/min/{1.73_m2} (ref >=60)
GFR, Est Non African American: 77 mL/min/{1.73_m2} (ref >=60)
Globulin: 2.9 g/dL (calc) (ref 1.9–3.7)
Glucose, Bld: 124 mg/dL — ABNORMAL HIGH (ref 65–99)
Potassium: 4 mmol/L (ref 3.5–5.3)
Sodium: 140 mmol/L (ref 135–146)
Total Bilirubin: 0.5 mg/dL (ref 0.2–1.2)
Total Protein: 7 g/dL (ref 6.1–8.1)

## 2018-02-06 LAB — LIPID PANEL
Cholesterol: 167 mg/dL (ref <200)
HDL: 66 mg/dL (ref >50)
LDL Cholesterol (Calc): 78 mg/dL (calc)
Non-HDL Cholesterol (Calc): 101 mg/dL (calc) (ref <130)
Total CHOL/HDL Ratio: 2.5 (calc) (ref <5.0)
Triglycerides: 131 mg/dL (ref <150)

## 2018-02-06 LAB — HEMOGLOBIN A1C
Hgb A1c MFr Bld: 6.8 % of total Hgb — ABNORMAL HIGH (ref <5.7)
Mean Plasma Glucose: 148 (calc)
eAG (mmol/L): 8.2 (calc)

## 2018-02-10 ENCOUNTER — Telehealth: Payer: Self-pay | Admitting: *Deleted

## 2018-02-10 NOTE — Telephone Encounter (Signed)
Patient called requesting a copy of her bloodwork to be mailed to her. Copy mailed

## 2018-02-25 DIAGNOSIS — H43811 Vitreous degeneration, right eye: Secondary | ICD-10-CM | POA: Diagnosis not present

## 2018-02-25 DIAGNOSIS — H40013 Open angle with borderline findings, low risk, bilateral: Secondary | ICD-10-CM | POA: Diagnosis not present

## 2018-02-25 DIAGNOSIS — E119 Type 2 diabetes mellitus without complications: Secondary | ICD-10-CM | POA: Diagnosis not present

## 2018-02-25 DIAGNOSIS — H04123 Dry eye syndrome of bilateral lacrimal glands: Secondary | ICD-10-CM | POA: Diagnosis not present

## 2018-02-25 DIAGNOSIS — H25813 Combined forms of age-related cataract, bilateral: Secondary | ICD-10-CM | POA: Diagnosis not present

## 2018-02-25 LAB — HM DIABETES EYE EXAM

## 2018-03-17 ENCOUNTER — Encounter: Payer: Self-pay | Admitting: *Deleted

## 2018-03-21 DIAGNOSIS — Z0000 Encounter for general adult medical examination without abnormal findings: Secondary | ICD-10-CM | POA: Insufficient documentation

## 2018-05-06 DIAGNOSIS — E119 Type 2 diabetes mellitus without complications: Secondary | ICD-10-CM | POA: Insufficient documentation

## 2018-05-06 DIAGNOSIS — F339 Major depressive disorder, recurrent, unspecified: Secondary | ICD-10-CM | POA: Insufficient documentation

## 2018-05-06 DIAGNOSIS — I10 Essential (primary) hypertension: Secondary | ICD-10-CM | POA: Insufficient documentation

## 2018-05-06 DIAGNOSIS — G4700 Insomnia, unspecified: Secondary | ICD-10-CM | POA: Insufficient documentation

## 2018-05-06 DIAGNOSIS — I4719 Other supraventricular tachycardia: Secondary | ICD-10-CM | POA: Insufficient documentation

## 2018-05-10 DIAGNOSIS — R0609 Other forms of dyspnea: Secondary | ICD-10-CM | POA: Insufficient documentation

## 2018-05-10 DIAGNOSIS — K589 Irritable bowel syndrome without diarrhea: Secondary | ICD-10-CM | POA: Insufficient documentation

## 2018-05-10 DIAGNOSIS — R413 Other amnesia: Secondary | ICD-10-CM | POA: Insufficient documentation

## 2018-05-10 DIAGNOSIS — K219 Gastro-esophageal reflux disease without esophagitis: Secondary | ICD-10-CM | POA: Insufficient documentation

## 2018-05-10 DIAGNOSIS — J45909 Unspecified asthma, uncomplicated: Secondary | ICD-10-CM | POA: Insufficient documentation

## 2018-07-30 ENCOUNTER — Ambulatory Visit: Payer: Self-pay

## 2018-08-06 ENCOUNTER — Encounter: Payer: Medicare HMO | Admitting: Nurse Practitioner

## 2018-08-06 ENCOUNTER — Ambulatory Visit: Payer: Self-pay

## 2018-08-22 ENCOUNTER — Encounter: Payer: Medicare HMO | Admitting: Nurse Practitioner

## 2018-10-17 ENCOUNTER — Other Ambulatory Visit: Payer: Self-pay | Admitting: Cardiology

## 2018-12-10 LAB — HEMOGLOBIN A1C: Hemoglobin A1C: 6.6

## 2018-12-10 LAB — LIPID PANEL
Cholesterol: 167 (ref 0–200)
LDL Cholesterol: 77
Triglycerides: 173 — AB (ref 40–160)

## 2018-12-10 LAB — BASIC METABOLIC PANEL
BUN: 25 — AB (ref 4–21)
Creatinine: 1.1 (ref 0.5–1.1)
Glucose: 125
Potassium: 4.1 (ref 3.4–5.3)
Sodium: 142 (ref 137–147)

## 2018-12-10 LAB — HEPATIC FUNCTION PANEL
ALT: 8 (ref 7–35)
AST: 14 (ref 13–35)
Alkaline Phosphatase: 55 (ref 25–125)
Bilirubin, Total: 0.4

## 2018-12-10 LAB — CBC AND DIFFERENTIAL
HCT: 42 (ref 36–46)
Hemoglobin: 13.8 (ref 12.0–16.0)
Neutrophils Absolute: 1903
Platelets: 188 (ref 150–399)
WBC: 6.1

## 2019-01-16 DIAGNOSIS — K5900 Constipation, unspecified: Secondary | ICD-10-CM | POA: Insufficient documentation

## 2019-01-21 ENCOUNTER — Ambulatory Visit
Admission: RE | Admit: 2019-01-21 | Discharge: 2019-01-21 | Disposition: A | Discharge status: Home / Self Care | Payer: Medicare HMO | Source: Ambulatory Visit | Attending: Family Medicine | Admitting: Family Medicine

## 2019-01-21 ENCOUNTER — Other Ambulatory Visit: Payer: Self-pay | Admitting: Family Medicine

## 2019-01-21 DIAGNOSIS — K5900 Constipation, unspecified: Secondary | ICD-10-CM

## 2019-02-11 ENCOUNTER — Ambulatory Visit (INDEPENDENT_AMBULATORY_CARE_PROVIDER_SITE_OTHER): Payer: Medicare HMO | Admitting: Family

## 2019-02-11 ENCOUNTER — Other Ambulatory Visit: Payer: Self-pay

## 2019-02-11 ENCOUNTER — Encounter: Payer: Self-pay | Admitting: Family

## 2019-02-11 VITALS — BP 108/60 | HR 67 | Temp 98.5°F | Ht 65.0 in | Wt 155.4 lb

## 2019-02-11 DIAGNOSIS — I10 Essential (primary) hypertension: Secondary | ICD-10-CM

## 2019-02-11 DIAGNOSIS — K1370 Unspecified lesions of oral mucosa: Secondary | ICD-10-CM

## 2019-02-11 DIAGNOSIS — E785 Hyperlipidemia, unspecified: Secondary | ICD-10-CM | POA: Diagnosis not present

## 2019-02-11 DIAGNOSIS — Z23 Encounter for immunization: Secondary | ICD-10-CM

## 2019-02-11 DIAGNOSIS — E1169 Type 2 diabetes mellitus with other specified complication: Secondary | ICD-10-CM | POA: Diagnosis not present

## 2019-02-11 DIAGNOSIS — K5901 Slow transit constipation: Secondary | ICD-10-CM | POA: Diagnosis not present

## 2019-02-11 DIAGNOSIS — E1159 Type 2 diabetes mellitus with other circulatory complications: Secondary | ICD-10-CM | POA: Diagnosis not present

## 2019-02-11 DIAGNOSIS — K219 Gastro-esophageal reflux disease without esophagitis: Secondary | ICD-10-CM | POA: Diagnosis not present

## 2019-02-11 DIAGNOSIS — E1142 Type 2 diabetes mellitus with diabetic polyneuropathy: Secondary | ICD-10-CM | POA: Diagnosis not present

## 2019-02-11 DIAGNOSIS — I152 Hypertension secondary to endocrine disorders: Secondary | ICD-10-CM

## 2019-02-11 MED ORDER — AMLODIPINE BESYLATE 5 MG PO TABS: 5.0000 mg | ORAL_TABLET | Freq: Every day | ORAL | 0 refills | Status: DC

## 2019-02-11 NOTE — Patient Instructions (Signed)

## 2019-02-11 NOTE — Progress Notes (Signed)
Provider: [name] Ardith Test FNP-C   Buzzy Han, MD  Patient Care Team: Buzzy Han, MD as PCP - General (Family Medicine) Dorothy Spark, MD as Consulting Physician (Cardiology) Clent Jacks, MD as Consulting Physician (Ophthalmology)  Extended Emergency Contact Information Primary Emergency Contact: [name],Mark Address: [place], [place] Phone: [phone] Relation: Son  Code Status: Full Code  Goals of care: Advanced Directive information Advanced Directives 02/11/2019  Does Patient Have a Medical Advance Directive? Yes  Type of Paramedic of [place];Living will  Does patient want to make changes to medical advance directive? No - Patient declined  Copy of [place] in Chart? Yes - validated most recent copy scanned in chart (See row information)     Chief Complaint  Patient presents with  . Medical Management of Chronic Issues    Complains of constipation, some GI distress. Wants referral to GI and ENT.   . Immunizations    Needs flu shot - given today.    HPI:  Pt is a 73 y.o. female seen today for medical management of chronic diseases. She complains of constipation and gurgling.she states has had issues with constipation for the past one month.she had stopped taking her miralax but has restarted.she has taken miralax for the past two days.Had small round bowel movement yesterday.she wanted a referral to GI since she had a  Small bowel movement would like to wait on referral if still unable to go will notify provider.she states eats vegetables and drinks lots of water.she tells me the only thing that changed was she used to go for exercise but since the COVID-19 restrictions she has been staying most of the times in the bed watching TV,reading and puzzles.she started going for a walk at least two times this past few days.  She would like referral to ENT for  evaluation of a spot on her left side of the mouth.she does not recall how long the spot has been there.No bleeding or pain on the area.  Type 2 DM - states CBG readings are in the 80's-100's with some 120's at home. She denies any signs of hypo/hyperglycemia.on metformin 1000 mg tablet twice daily.she has seen Ophthalmology for annual eye exam.she needs foot exam with podiatrist.On statin but not on ASA.    Hypertension - Reports B/p at home in the 120's/60's - 120's/70's. Currently on amlodipine 5 mg tablet daily,metoprolol 12.5 mg tablet twice daily.shedenies any headache,dizziness,chest pain,palpitation or shortness of breath.   Hyperlipidemia - labs reviewed LDL 77,cholesterol 167,triglycerides 173  ( 12/10/2018).states watching her diet and includes veggies.Has not been exercising as she used to due to COVID-19 restrictions.  GERD - complains of epigastric pain.Had stopped taking Omeprazole but states will resume. Denies any signs of bleeding.Hgb reviewed stable.     Past Medical History:  Diagnosis Date  . Abnormal Pap smear 05/08/2011   ASC-US +HPV  . Arthritis   . Asthma   . Constipation   . Diabetes mellitus type 2 with neurological manifestations ([place])   . Diabetic peripheral neuropathy ([place])   . Generalized osteoarthritis   . GERD (gastroesophageal reflux disease)   . Hx of seasonal allergies   . Hyperlipidemia   . Hypertension   . IBS (irritable bowel syndrome)   . S/P total hysterectomy 06/21/2011   Past Surgical History:  Procedure Laterality Date  . EYE SURGERY Right 01/2018   Removed raised  area on eye. Dr.Groat   . OVARIAN CYST SURGERY  1986  . PELVIC LAPAROSCOPY    . TONSILECTOMY, ADENOIDECTOMY, BILATERAL MYRINGOTOMY AND TUBES  1974  . VAGINAL HYSTERECTOMY  1983    Allergies  Allergen Reactions  . Aspirin   . Pravastatin Sodium Other (See Comments)    Muscle aches    Allergies as of 02/11/2019      Reactions   Aspirin    Pravastatin Sodium Other (See  Comments)   Muscle aches      Medication List       Accurate as of February 11, 2019 11:44 AM. If you have any questions, ask your nurse or doctor.        STOP taking these medications   gabapentin 400 MG capsule Commonly known as: NEURONTIN Stopped by: [name], NP     TAKE these medications   Accu-Chek Aviva Plus test strip Generic drug: glucose blood USE TO TEST BLOOD SUGAR TWICE DAILY   Accu-Chek Aviva Plus w/Device Kit Use to test blood sugar twice daily. Dx E11.42   Accu-Chek Softclix Lancets lancets USE  TO TEST BLOOD SUGAR ONE TIME DAILY FOR DIABETES   Align 4 MG Caps Take 1 capsule by mouth daily.   amLODipine 5 MG tablet Commonly known as: NORVASC Take 1 tablet (5 mg total) by mouth daily.   B-D SINGLE USE SWABS REGULAR Pads As directed for diabetes one daily to test blood E11.42   cholecalciferol 1000 units tablet Commonly known as: VITAMIN D Take 1,000 Units by mouth daily.   Fish Oil 1000 MG Caps Take 2 capsules by mouth daily.   fluticasone 50 MCG/ACT nasal spray Commonly known as: FLONASE Place 2 sprays into both nostrils 2 (two) times daily.   hydrocortisone 2.5 % rectal cream Commonly known as: Proctosol HC Place 1 application rectally 2 (two) times daily. For hemorrhoids   loratadine 10 MG tablet Commonly known as: CLARITIN Take 10 mg by mouth daily.   metFORMIN 500 MG 24 hr tablet Commonly known as: GLUCOPHAGE-XR Take 1,000 mg by mouth 2 (two) times daily.   metoprolol tartrate 25 MG tablet Commonly known as: LOPRESSOR Take 0.5 tablets (12.5 mg total) by mouth 2 (two) times daily.   ICAPS AREDS FORMULA PO Take 1 capsule by mouth daily.   multivitamin with minerals tablet Take 1 tablet by mouth daily.   OVER THE COUNTER MEDICATION Place 1 drop into both eyes 3 (three) times daily as needed. For dry eyes. Equate Brand Restore Tears Lubricant Eye Drops   polyethylene glycol 17 g packet Commonly known as: MIRALAX /  GLYCOLAX Take 17 g by mouth daily as needed. For constipation   rosuvastatin 5 MG tablet Commonly known as: CRESTOR Take 5 mg by mouth daily.       Review of Systems  Constitutional: Negative for appetite change, chills, fatigue, fever and unexpected weight change.  HENT: Negative for congestion, ear pain, postnasal drip, rhinorrhea, sinus pressure, sinus pain, sneezing, sore throat and trouble swallowing.        Seasonal allergies stable  Eyes: Negative for discharge, redness, itching and visual disturbance.  Respiratory: Negative for cough, chest tightness, shortness of breath and wheezing.   Cardiovascular: Negative for chest pain, palpitations and leg swelling.  Gastrointestinal: Negative for abdominal distention, abdominal pain, constipation, diarrhea, nausea and vomiting.  Endocrine: Negative for cold intolerance, heat intolerance, polydipsia, polyphagia and polyuria.  Genitourinary: Negative for difficulty urinating, dysuria, flank pain, frequency and urgency.  Musculoskeletal:  Positive for arthralgias, gait problem and neck pain. Negative for joint swelling and neck stiffness.  Skin: Negative for color change, pallor, rash and wound.  Neurological: Negative for dizziness, weakness, light-headedness, numbness and headaches.  Hematological: Does not bruise/bleed easily.  Psychiatric/Behavioral: Negative for agitation and sleep disturbance. The patient is not nervous/anxious.     Immunization History  Administered Date(s) Administered  . Fluad Quad(high Dose 65+) 02/11/2019  . Influenza, High Dose Seasonal PF 01/28/2018  . Influenza,inj,Quad PF,6+ Mos 02/03/2016, 01/23/2017  . Pneumococcal Conjugate-13 07/30/2014  . Pneumococcal Polysaccharide-23 11/17/2005, 05/20/2012  . Td 08/18/2010   Pertinent  Health Maintenance Due  Topic Date Due  . FOOT EXAM  01/23/2018  . URINE MICROALBUMIN  07/25/2018  . HEMOGLOBIN A1C  08/07/2018  . OPHTHALMOLOGY EXAM  02/26/2019  . MAMMOGRAM   10/01/2019  . COLONOSCOPY  09/05/2025  . INFLUENZA VACCINE  Completed  . DEXA SCAN  Completed  . PNA vac Low Risk Adult  Completed   Fall Risk  02/11/2019 01/28/2018 07/24/2017 07/24/2017 06/26/2017  Falls in the past year? 0 No No No No    Vitals:   02/11/19 1114  BP: 108/60  Pulse: 67  Temp: 98.5 F (36.9 C)  TempSrc: Oral  SpO2: 98%  Weight: 155 lb 6.4 oz (70.5 kg)  Height: _0  (1.651 m)   Body mass index is 25.86 kg/m. Physical Exam Constitutional:      General: She is not in acute distress.    Appearance: She is overweight. She is not ill-appearing.  HENT:     Head: Normocephalic.     Comments: Left posterior buccal lesion without any redness,bleeding and non-tender to palpation.     Right Ear: Tympanic membrane, ear canal and external ear normal. There is no impacted cerumen.     Left Ear: Tympanic membrane, ear canal and external ear normal. There is no impacted cerumen.     Nose: Nose normal. No congestion or rhinorrhea.     Mouth/Throat:     Mouth: Mucous membranes are moist. Oral lesions present.     Pharynx: Oropharynx is clear. No oropharyngeal exudate or posterior oropharyngeal erythema.  Eyes:     General: No scleral icterus.       Right eye: No discharge.        Left eye: No discharge.     Extraocular Movements: Extraocular movements intact.     Conjunctiva/sclera: Conjunctivae normal.     Pupils: Pupils are equal, round, and reactive to light.  Neck:     Musculoskeletal: Normal range of motion. No neck rigidity or muscular tenderness.     Vascular: No carotid bruit.  Cardiovascular:     Rate and Rhythm: Normal rate and regular rhythm.     Pulses: Normal pulses.     Heart sounds: Normal heart sounds. No murmur. No friction rub. No gallop.   Pulmonary:     Effort: Pulmonary effort is normal. No respiratory distress.     Breath sounds: Normal breath sounds. No wheezing, rhonchi or rales.  Chest:     Chest wall: No tenderness.  Abdominal:      General: Bowel sounds are normal. There is no distension.     Palpations: Abdomen is soft. There is no mass.     Tenderness: There is no abdominal tenderness. There is no right CVA tenderness, left CVA tenderness, guarding or rebound.  Musculoskeletal: Normal range of motion.        General: No swelling or tenderness.  Right lower leg: No edema.     Left lower leg: No edema.     Comments: Unsteady gait walkers with a cane   Lymphadenopathy:     Cervical: No cervical adenopathy.  Skin:    General: Skin is warm and dry.     Coloration: Skin is not pale.     Findings: No bruising, erythema or rash.  Neurological:     Mental Status: She is alert and oriented to person, place, and time.     Cranial Nerves: No cranial nerve deficit.     Sensory: No sensory deficit.     Motor: No weakness.     Coordination: Coordination normal.     Gait: Gait abnormal.  Psychiatric:        Mood and Affect: Mood normal.        Behavior: Behavior normal.        Thought Content: Thought content normal.        Judgment: Judgment normal.     Labs reviewed: Recent Labs    12/10/18  NA 142  K 4.1  BUN 25*  CREATININE 1.1   Recent Labs    12/10/18  AST 14  ALT 8  ALKPHOS 55   Recent Labs    12/10/18  WBC 6.1  NEUTROABS 1,903  HGB 13.8  HCT 42  PLT 188   Lab Results  Component Value Date   TSH 0.99 07/19/2017   Lab Results  Component Value Date   HGBA1C 6.6 12/10/2018   Lab Results  Component Value Date   CHOL 167 12/10/2018   HDL 66 02/05/2018   LDLCALC 77 12/10/2018   TRIG 173 (A) 12/10/2018   CHOLHDL 2.5 02/05/2018    Significant Diagnostic Results in last 30 days:  Dg Abd 1 View  Result Date: 01/21/2019 CLINICAL DATA:  Constipation for 2 weeks EXAM: ABDOMEN - 1 VIEW COMPARISON:  CT abdomen dated 03/17/2013 FINDINGS: The bowel gas pattern is nonobstructive. A moderate stool burden overlies the rectum. Phleboliths are seen in the pelvis. No radio-opaque calculi or other  significant radiographic abnormality are seen. IMPRESSION: Moderate stool burden in the rectum. No evidence of bowel obstruction. Electronically Signed   By: Zerita Boers M.D.   On: 01/21/2019 14:49    Assessment/Plan 1. Need for influenza vaccination Afebrile.No signs of URI's. - Flu Vaccine QUAD High Dose(Fluad) administered by CMA  2. Type 2 diabetes mellitus with diabetic polyneuropathy, without long-term current use of insulin ([facility]) Lab Results  Component Value Date   HGBA1C 6.6 12/10/2018  Continue on Metformin ER 24 hr 1000 mg tablet twice daily.Discussed with patient recall of metformin will switch to IR states still has enough medication that she would like to complete prior to changing.continue on statin. Will need urine for microalbumin then start on ACE inhibitor if needed.Annual eye exam up to date.  - Hemoglobin A1c; Future - Ambulatory referral to Podiatry  3. Hypertension associated with diabetes ([place]) B/p goal.Continue on amlodipine 5 mg tablet daily,metoprolol 12.5 mg tablet twice daily. - CBC with Differential/Platelet; Future - CMP with eGFR([facility]); Future - TSH; Future  4. Hyperlipidemia associated with type 2 diabetes mellitus ([facility]) LDL at goal.continue on rosuvastatin 5 mg tablet daily and omega -3 fatty acids.  - Lipid panel; Future  5. Mouth lesion Left buccal lesion without any redness or tenderness.will refer to ENT for evaluation.  - Ambulatory referral to ENT  6. Gastroesophageal reflux disease without esophagitis Reported epigastric pain.None tender to palpation.had stopped omeprazole.Encouraged  to resume Omeprazole twice daily as directed.  - CBC with Differential/Platelet; Future  7. Slow transit constipation Reports small round stool.Has taken miralax  x 2 doses.Negative exam findings.encouraged to increase fiber and fluid intake.Also restart her walking exercises.will notify provider if GI referral needed.    Family/ staff Communication:  Reviewed plan of care with patient.  Labs/tests ordered:  - Lipid panel; Future - CBC with Differential/Platelet; Future - CMP with eGFR([facility]); Future - TSH; Future [name], NP

## 2019-02-12 ENCOUNTER — Ambulatory Visit: Payer: Medicare HMO | Admitting: Family

## 2019-02-12 ENCOUNTER — Telehealth: Payer: Self-pay | Admitting: *Deleted

## 2019-02-12 NOTE — Telephone Encounter (Signed)
Patient notified and agreed.  

## 2019-02-12 NOTE — Telephone Encounter (Signed)
Patient called and stated that she was seen yesterday and Amlodipine was called in to the pharmacy. Patient stated that she has always gotten just Amlodipine but yesterday the pharmacy filled Amlodipine Besylate. Patient is wanting to know if this is ok to take or a mistake. Please Advise.

## 2019-02-12 NOTE — Telephone Encounter (Signed)
Suspect this could be depending on where the pharmacy get there medication.she should be okay to take it.

## 2019-02-18 ENCOUNTER — Telehealth: Payer: Self-pay

## 2019-02-18 NOTE — Telephone Encounter (Signed)
Patient called to see if her referrals had been sent I checked her chart and found they had been sent and it showed the name of the doctor it was with and I told her the information

## 2019-02-24 ENCOUNTER — Ambulatory Visit (INDEPENDENT_AMBULATORY_CARE_PROVIDER_SITE_OTHER): Payer: Medicare HMO | Admitting: Otolaryngology

## 2019-02-25 ENCOUNTER — Telehealth: Payer: Self-pay

## 2019-02-25 NOTE — Telephone Encounter (Signed)
Patient called today to say  that the number we gave her for her ENT referral was wrong and that they said they never received the referral from our office spoke with Lattie Haw and she sent a new referral and called the patient back

## 2019-03-03 ENCOUNTER — Telehealth: Payer: Self-pay | Admitting: Family

## 2019-03-03 NOTE — Telephone Encounter (Signed)
Schedule for visit to order antibiotics.

## 2019-03-03 NOTE — Telephone Encounter (Signed)
Pt has dental appt 03/11/19 & her dentist told her she needed to reach out to primary care to get antibiotics for the tooth that may need to be pulled.  Thanks, Lattie Haw

## 2019-03-04 ENCOUNTER — Encounter: Payer: Self-pay | Admitting: Family

## 2019-03-04 ENCOUNTER — Other Ambulatory Visit: Payer: Self-pay

## 2019-03-04 ENCOUNTER — Ambulatory Visit (INDEPENDENT_AMBULATORY_CARE_PROVIDER_SITE_OTHER): Payer: Medicare HMO | Admitting: Family

## 2019-03-04 VITALS — BP 118/68 | HR 70 | Temp 98.2°F | Ht 65.0 in | Wt 155.4 lb

## 2019-03-04 DIAGNOSIS — K1370 Unspecified lesions of oral mucosa: Secondary | ICD-10-CM | POA: Diagnosis not present

## 2019-03-04 DIAGNOSIS — K047 Periapical abscess without sinus: Secondary | ICD-10-CM

## 2019-03-04 MED ORDER — PENICILLIN V POTASSIUM 500 MG PO TABS: ORAL_TABLET | ORAL | 0 refills | Status: DC

## 2019-03-04 NOTE — Progress Notes (Addendum)
Provider: [name]  Kimarion Chery, [name], NP  Patient Care Team: Harith Mccadden, [name], NP as PCP - General (Family Medicine) Dorothy Spark, MD as Consulting Physician (Cardiology) Clent Jacks, MD as Consulting Physician (Ophthalmology)  Extended Emergency Contact Information Primary Emergency Contact: [name],Mark Address: [place], [place] Phone: [phone] Relation: Son  Code Status:  DNR Goals of care: Advanced Directive information Advanced Directives 02/11/2019  Does Patient Have a Medical Advance Directive? Yes  Type of Paramedic of [place];Living will  Does patient want to make changes to medical advance directive? No - Patient declined  Copy of [place] in Chart? Yes - validated most recent copy scanned in chart (See row information)     Chief Complaint  Patient presents with   Acute Visit    Here to discuss getting antibiotics for dentist appointment, patient states she cut finger on picture frame 2 weeks ago and was concerned about Tetanus Shot     Quality Metric Gaps    Hepatitis C Screening, MicroAlbumin and Eye Exam    HPI:  Pt is a 73 y.o. female seen today  for an acute visit to discuss getting antibiotics for dentist appointment.she states has two premolar teeth one on the right and left on the bottom that needs removal.she was told by dentist to get antibiotics prior to procedure.she states has some redness on the gum on the left side.she denies any fever or chills.she needs dentures but states too expensive.  patient states she cut finger on picture frame 2 weeks ago and was concerned about Tetanus Shot.she is up to date on her Tdap vaccine. No signs of infection reported.      Past Medical History:  Diagnosis Date   Abnormal Pap smear 05/08/2011   ASC-US +HPV   Arthritis    Asthma    Constipation    Diabetes mellitus type 2 with neurological  manifestations (HCC)    Diabetic peripheral neuropathy (HCC)    Generalized osteoarthritis    GERD (gastroesophageal reflux disease)    Hx of seasonal allergies    Hyperlipidemia    Hypertension    IBS (irritable bowel syndrome)    S/P total hysterectomy 06/21/2011   Past Surgical History:  Procedure Laterality Date   EYE SURGERY Right 01/2018   Removed raised area on eye. Dr.Groat    OVARIAN CYST SURGERY  1986   PELVIC LAPAROSCOPY     TONSILECTOMY, ADENOIDECTOMY, BILATERAL MYRINGOTOMY AND TUBES  1974   VAGINAL HYSTERECTOMY  1983    Allergies  Allergen Reactions   Aspirin    Pravastatin Sodium Other (See Comments)    Muscle aches    Outpatient Encounter Medications as of 03/04/2019  Medication Sig   ACCU-CHEK AVIVA PLUS test strip USE TO TEST BLOOD SUGAR TWICE DAILY   ACCU-CHEK SOFTCLIX LANCETS lancets USE  TO TEST BLOOD SUGAR ONE TIME DAILY FOR DIABETES   Alcohol Swabs (B-D SINGLE USE SWABS REGULAR) PADS As directed for diabetes one daily to test blood E11.42   amLODipine (NORVASC) 5 MG tablet Take 1 tablet (5 mg total) by mouth daily.   Blood Glucose Monitoring Suppl (ACCU-CHEK AVIVA PLUS) w/Device KIT Use to test blood sugar twice daily. Dx E11.42   cholecalciferol (VITAMIN D) 1000 UNITS tablet Take 1,000 Units by mouth daily.    fluticasone (FLONASE) 50 MCG/ACT nasal spray Place 2 sprays into both  nostrils 2 (two) times daily.   loratadine (CLARITIN) 10 MG tablet Take 10 mg by mouth daily.   metFORMIN (GLUCOPHAGE-XR) 500 MG 24 hr tablet Take 1,000 mg by mouth daily with breakfast.    metoprolol tartrate (LOPRESSOR) 25 MG tablet Take 0.5 tablets (12.5 mg total) by mouth 2 (two) times daily.   Multiple Vitamins-Minerals (ICAPS AREDS FORMULA PO) Take 1 capsule by mouth daily.    Multiple Vitamins-Minerals (MULTIVITAMIN WITH MINERALS) tablet Take 1 tablet by mouth daily.   Omega-3 Fatty Acids (FISH OIL) 1000 MG CAPS Take 2 capsules by mouth 2 (two)  times a week.    omeprazole (PRILOSEC) 40 MG capsule Take 40 mg by mouth daily.   OVER THE COUNTER MEDICATION Place 1 drop into both eyes 2 (two) times daily as needed. For dry eyes. Equate Brand Restore Tears Lubricant Eye Drops    polyethylene glycol (MIRALAX / GLYCOLAX) packet Take 17 g by mouth daily as needed. For constipation   Probiotic Product (ALIGN) 4 MG CAPS Take 1 capsule by mouth daily.    rosuvastatin (CRESTOR) 5 MG tablet Take 5 mg by mouth daily.   [DISCONTINUED] hydrocortisone (PROCTOSOL HC) 2.5 % rectal cream Place 1 application rectally 2 (two) times daily. For hemorrhoids   No facility-administered encounter medications on file as of 03/04/2019.     Review of Systems  Constitutional: Negative for appetite change, chills, fatigue and fever.  HENT: Positive for dental problem. Negative for congestion, rhinorrhea, sinus pressure, sinus pain, sneezing and sore throat.   Eyes: Positive for visual disturbance. Negative for pain, discharge, redness and itching.       Requires cataract surgery but would like to wait until January 2021  Respiratory: Negative for cough, chest tightness, shortness of breath and wheezing.   Cardiovascular: Negative for chest pain, palpitations and leg swelling.  Gastrointestinal: Negative for abdominal distention, abdominal pain, constipation, diarrhea, nausea and vomiting.  Skin: Negative for color change, pallor and rash.  Neurological: Negative for dizziness, light-headedness and headaches.  Psychiatric/Behavioral: Negative for agitation and confusion. The patient is not nervous/anxious.     Immunization History  Administered Date(s) Administered   Fluad Quad(high Dose 65+) 02/11/2019   Influenza, High Dose Seasonal PF 01/28/2018   Influenza,inj,Quad PF,6+ Mos 02/03/2016, 01/23/2017   Pneumococcal Conjugate-13 07/30/2014   Pneumococcal Polysaccharide-23 11/17/2005, 05/20/2012   Td 08/18/2010   Pertinent  Health Maintenance Due    Topic Date Due   FOOT EXAM  01/23/2018   URINE MICROALBUMIN  07/25/2018   OPHTHALMOLOGY EXAM  02/26/2019   HEMOGLOBIN A1C  06/12/2019   MAMMOGRAM  10/01/2019   COLONOSCOPY  09/05/2025   INFLUENZA VACCINE  Completed   DEXA SCAN  Completed   PNA vac Low Risk Adult  Completed   Fall Risk  03/04/2019 02/11/2019 01/28/2018 07/24/2017 07/24/2017  Falls in the past year? 0 0 No No No  Number falls in past yr: 0 - - - -  Injury with Fall? 0 - - - -    Vitals:   03/04/19 1355  BP: 118/68  Pulse: 70  Temp: 98.2 F (36.8 C)  TempSrc: Temporal  SpO2: 98%  Weight: 155 lb 6.4 oz (70.5 kg)  Height: _0  (1.651 m)   Body mass index is 25.86 kg/m. Physical Exam Vitals signs reviewed.  Constitutional:      General: She is not in acute distress.    Appearance: She is overweight. She is not ill-appearing.  HENT:     Head: Normocephalic.  Nose: Nose normal. No congestion or rhinorrhea.     Mouth/Throat:     Mouth: Mucous membranes are moist. No oral lesions.     Dentition: Abnormal dentition. Dental caries present. No dental tenderness, dental abscesses or gum lesions.     Pharynx: Oropharynx is clear. No oropharyngeal exudate.     Comments: Left gum redness  Eyes:     General: No scleral icterus.       Right eye: No discharge.        Left eye: No discharge.     Extraocular Movements: Extraocular movements intact.     Conjunctiva/sclera: Conjunctivae normal.     Pupils: Pupils are equal, round, and reactive to light.  Cardiovascular:     Rate and Rhythm: Normal rate. Rhythm irregular.     Pulses: Normal pulses.     Heart sounds: Murmur present. No friction rub. No gallop.   Pulmonary:     Effort: Pulmonary effort is normal. No respiratory distress.     Breath sounds: Normal breath sounds. No wheezing, rhonchi or rales.  Chest:     Chest wall: No tenderness.  Abdominal:     General: Bowel sounds are normal. There is no distension.     Palpations: Abdomen is soft.  There is no mass.     Tenderness: There is no abdominal tenderness. There is no right CVA tenderness, left CVA tenderness, guarding or rebound.  Skin:    General: Skin is warm and dry.     Coloration: Skin is not pale.     Findings: No erythema or rash.  Neurological:     Mental Status: She is alert and oriented to person, place, and time.     Cranial Nerves: No cranial nerve deficit.     Sensory: No sensory deficit.     Motor: No weakness.     Gait: Gait normal.  Psychiatric:        Mood and Affect: Mood normal.        Behavior: Behavior normal.        Thought Content: Thought content normal.        Judgment: Judgment normal.    Labs reviewed: Recent Labs    12/10/18  NA 142  K 4.1  BUN 25*  CREATININE 1.1   Recent Labs    12/10/18  AST 14  ALT 8  ALKPHOS 55   Recent Labs    12/10/18  WBC 6.1  NEUTROABS 1,903  HGB 13.8  HCT 42  PLT 188   Lab Results  Component Value Date   TSH 0.99 07/19/2017   Lab Results  Component Value Date   HGBA1C 6.6 12/10/2018   Lab Results  Component Value Date   CHOL 167 12/10/2018   HDL 66 02/05/2018   LDLCALC 77 12/10/2018   TRIG 173 (A) 12/10/2018   CHOLHDL 2.5 02/05/2018    Significant Diagnostic Results in last 30 days:  No results found.  Assessment/Plan  Dental infection/lesion  Afebrile.lower premolar tooth on right and the left due for removal by dentist on 03/11/2019.she requires oral antibiotics pre and post extraction.No teeth on the top.Has 4 frontal teeth that need removal for dentures but states too expensive.    - penicillin v potassium (VEETID) 500 MG tablet; Take 2 tablet before and after dental procedure  Dispense: 4 tablet; Refill: 0 - Notify provider/dentist for any signs of infections post extraction of teeth.   Family/ staff Communication: Reviewed plan of care with patient  Labs/tests ordered:  None   [name], NP

## 2019-03-04 NOTE — Telephone Encounter (Signed)
Patient notified and appointment scheduled for today with [facility]

## 2019-03-11 DIAGNOSIS — R6889 Other general symptoms and signs: Secondary | ICD-10-CM | POA: Diagnosis not present

## 2019-03-17 DIAGNOSIS — K1370 Unspecified lesions of oral mucosa: Secondary | ICD-10-CM | POA: Diagnosis not present

## 2019-03-17 DIAGNOSIS — K1329 Other disturbances of oral epithelium, including tongue: Secondary | ICD-10-CM | POA: Diagnosis not present

## 2019-03-17 DIAGNOSIS — R6889 Other general symptoms and signs: Secondary | ICD-10-CM | POA: Diagnosis not present

## 2019-04-21 ENCOUNTER — Other Ambulatory Visit: Payer: Self-pay | Admitting: *Deleted

## 2019-04-21 MED ORDER — METFORMIN HCL ER 500 MG PO TB24: 1000.0000 mg | ORAL_TABLET | Freq: Every day | ORAL | 1 refills | Status: DC

## 2019-04-21 MED ORDER — AMLODIPINE BESYLATE 5 MG PO TABS: 5.0000 mg | ORAL_TABLET | Freq: Every day | ORAL | 1 refills | Status: DC

## 2019-04-21 NOTE — Telephone Encounter (Signed)
Patient requested refills to [facility]

## 2019-05-09 ENCOUNTER — Other Ambulatory Visit: Payer: Self-pay | Admitting: Family

## 2019-05-29 ENCOUNTER — Other Ambulatory Visit: Payer: Self-pay | Admitting: Cardiology

## 2019-07-13 ENCOUNTER — Ambulatory Visit: Payer: Medicare HMO | Attending: Internal Medicine

## 2019-07-13 DIAGNOSIS — Z20822 Contact with and (suspected) exposure to covid-19: Secondary | ICD-10-CM

## 2019-07-14 ENCOUNTER — Telehealth: Payer: Self-pay | Admitting: Family

## 2019-07-14 NOTE — Telephone Encounter (Signed)
Pt called stating she needs an updated referral to podiatry bc she ws in quarantine & couldn't go last year.  [facility] told her that her current referral was expired & only good for 3 mths?  Thanks, Kathyrn Lass

## 2019-07-14 NOTE — Telephone Encounter (Signed)
Refer to Podiatrist for annual examination for type 2 DM /neuropathy

## 2019-07-15 LAB — NOVEL CORONAVIRUS, NAA: SARS-CoV-2, NAA: NOT DETECTED

## 2019-07-15 LAB — SARS-COV-2, NAA 2 DAY TAT

## 2019-07-27 ENCOUNTER — Telehealth: Payer: Self-pay

## 2019-07-27 NOTE — Telephone Encounter (Signed)
Patient called and left a message, asking if we had received the referral from her primary care doctor.  Please call to advise Thanks

## 2019-08-04 ENCOUNTER — Other Ambulatory Visit: Payer: Self-pay | Admitting: Family

## 2019-08-04 DIAGNOSIS — Z1231 Encounter for screening mammogram for malignant neoplasm of breast: Secondary | ICD-10-CM

## 2019-08-07 ENCOUNTER — Other Ambulatory Visit: Payer: Medicare HMO

## 2019-08-07 ENCOUNTER — Other Ambulatory Visit: Payer: Self-pay

## 2019-08-07 DIAGNOSIS — E1159 Type 2 diabetes mellitus with other circulatory complications: Secondary | ICD-10-CM

## 2019-08-07 DIAGNOSIS — E1142 Type 2 diabetes mellitus with diabetic polyneuropathy: Secondary | ICD-10-CM | POA: Diagnosis not present

## 2019-08-07 DIAGNOSIS — K219 Gastro-esophageal reflux disease without esophagitis: Secondary | ICD-10-CM | POA: Diagnosis not present

## 2019-08-07 DIAGNOSIS — E785 Hyperlipidemia, unspecified: Secondary | ICD-10-CM | POA: Diagnosis not present

## 2019-08-07 DIAGNOSIS — E1169 Type 2 diabetes mellitus with other specified complication: Secondary | ICD-10-CM | POA: Diagnosis not present

## 2019-08-07 DIAGNOSIS — I152 Hypertension secondary to endocrine disorders: Secondary | ICD-10-CM

## 2019-08-07 DIAGNOSIS — I10 Essential (primary) hypertension: Secondary | ICD-10-CM | POA: Diagnosis not present

## 2019-08-08 LAB — COMPLETE METABOLIC PANEL WITH GFR
AG Ratio: 1.6 (calc) (ref 1.0–2.5)
ALT: 12 U/L (ref 6–29)
AST: 18 U/L (ref 10–35)
Albumin: 4 g/dL (ref 3.6–5.1)
Alkaline phosphatase (APISO): 53 U/L (ref 37–153)
BUN/Creatinine Ratio: 19 (calc) (ref 6–22)
BUN: 20 mg/dL (ref 7–25)
CO2: 26 mmol/L (ref 20–32)
Calcium: 9 mg/dL (ref 8.6–10.4)
Chloride: 105 mmol/L (ref 98–110)
Creat: 1.05 mg/dL — ABNORMAL HIGH (ref 0.60–0.93)
GFR, Est African American: 61 mL/min/{1.73_m2} (ref >=60)
GFR, Est Non African American: 53 mL/min/{1.73_m2} — ABNORMAL LOW (ref >=60)
Globulin: 2.5 g/dL (calc) (ref 1.9–3.7)
Glucose, Bld: 138 mg/dL — ABNORMAL HIGH (ref 65–99)
Potassium: 4 mmol/L (ref 3.5–5.3)
Sodium: 142 mmol/L (ref 135–146)
Total Bilirubin: 0.4 mg/dL (ref 0.2–1.2)
Total Protein: 6.5 g/dL (ref 6.1–8.1)

## 2019-08-08 LAB — LIPID PANEL
Cholesterol: 154 mg/dL (ref <200)
HDL: 57 mg/dL (ref >=50)
LDL Cholesterol (Calc): 72 mg/dL (calc)
Non-HDL Cholesterol (Calc): 97 mg/dL (calc) (ref <130)
Total CHOL/HDL Ratio: 2.7 (calc) (ref <5.0)
Triglycerides: 173 mg/dL — ABNORMAL HIGH (ref <150)

## 2019-08-08 LAB — CBC WITH DIFFERENTIAL/PLATELET
Absolute Monocytes: 534 cells/uL (ref 200–950)
Basophils Absolute: 50 cells/uL (ref 0–200)
Basophils Relative: 0.9 %
Eosinophils Absolute: 391 cells/uL (ref 15–500)
Eosinophils Relative: 7.1 %
HCT: 39.9 % (ref 35.0–45.0)
Hemoglobin: 13.3 g/dL (ref 11.7–15.5)
Lymphs Abs: 2690 cells/uL (ref 850–3900)
MCH: 28.5 pg (ref 27.0–33.0)
MCHC: 33.3 g/dL (ref 32.0–36.0)
MCV: 85.4 fL (ref 80.0–100.0)
MPV: 10.3 fL (ref 7.5–12.5)
Monocytes Relative: 9.7 %
Neutro Abs: 1837 cells/uL (ref 1500–7800)
Neutrophils Relative %: 33.4 %
Platelets: 186 10*3/uL (ref 140–400)
RBC: 4.67 10*6/uL (ref 3.80–5.10)
RDW: 13.2 % (ref 11.0–15.0)
Total Lymphocyte: 48.9 %
WBC: 5.5 10*3/uL (ref 3.8–10.8)

## 2019-08-08 LAB — HEMOGLOBIN A1C
Hgb A1c MFr Bld: 6.9 % of total Hgb — ABNORMAL HIGH (ref <5.7)
Mean Plasma Glucose: 151 (calc)
eAG (mmol/L): 8.4 (calc)

## 2019-08-08 LAB — TSH: TSH: 1.64 mIU/L (ref 0.40–4.50)

## 2019-08-12 ENCOUNTER — Ambulatory Visit (INDEPENDENT_AMBULATORY_CARE_PROVIDER_SITE_OTHER): Payer: Medicare HMO | Admitting: Family

## 2019-08-12 ENCOUNTER — Encounter: Payer: Self-pay | Admitting: Family

## 2019-08-12 ENCOUNTER — Other Ambulatory Visit: Payer: Self-pay

## 2019-08-12 VITALS — BP 124/70 | HR 73 | Temp 97.3°F | Ht 65.0 in | Wt 155.6 lb

## 2019-08-12 DIAGNOSIS — G8929 Other chronic pain: Secondary | ICD-10-CM

## 2019-08-12 DIAGNOSIS — R682 Dry mouth, unspecified: Secondary | ICD-10-CM

## 2019-08-12 DIAGNOSIS — E1159 Type 2 diabetes mellitus with other circulatory complications: Secondary | ICD-10-CM

## 2019-08-12 DIAGNOSIS — Z1159 Encounter for screening for other viral diseases: Secondary | ICD-10-CM

## 2019-08-12 DIAGNOSIS — G4700 Insomnia, unspecified: Secondary | ICD-10-CM | POA: Diagnosis not present

## 2019-08-12 DIAGNOSIS — J302 Other seasonal allergic rhinitis: Secondary | ICD-10-CM

## 2019-08-12 DIAGNOSIS — I10 Essential (primary) hypertension: Secondary | ICD-10-CM

## 2019-08-12 DIAGNOSIS — K5901 Slow transit constipation: Secondary | ICD-10-CM | POA: Insufficient documentation

## 2019-08-12 DIAGNOSIS — E1169 Type 2 diabetes mellitus with other specified complication: Secondary | ICD-10-CM | POA: Diagnosis not present

## 2019-08-12 DIAGNOSIS — E1142 Type 2 diabetes mellitus with diabetic polyneuropathy: Secondary | ICD-10-CM | POA: Diagnosis not present

## 2019-08-12 DIAGNOSIS — K219 Gastro-esophageal reflux disease without esophagitis: Secondary | ICD-10-CM

## 2019-08-12 DIAGNOSIS — M25562 Pain in left knee: Secondary | ICD-10-CM

## 2019-08-12 DIAGNOSIS — E785 Hyperlipidemia, unspecified: Secondary | ICD-10-CM

## 2019-08-12 DIAGNOSIS — R2681 Unsteadiness on feet: Secondary | ICD-10-CM | POA: Insufficient documentation

## 2019-08-12 DIAGNOSIS — I152 Hypertension secondary to endocrine disorders: Secondary | ICD-10-CM

## 2019-08-12 DIAGNOSIS — M25561 Pain in right knee: Secondary | ICD-10-CM

## 2019-08-12 MED ORDER — TRAZODONE HCL 50 MG PO TABS: 25.0000 mg | ORAL_TABLET | Freq: Every evening | ORAL | 3 refills | Status: DC | PRN

## 2019-08-12 MED ORDER — SENNOSIDES-DOCUSATE SODIUM 8.6-50 MG PO TABS: 1.0000 | ORAL_TABLET | Freq: Every day | ORAL | 3 refills | Status: DC

## 2019-08-12 MED ORDER — ACCU-CHEK SOFTCLIX LANCETS MISC: 6 refills | Status: DC

## 2019-08-12 MED ORDER — AMLODIPINE BESYLATE 5 MG PO TABS: ORAL_TABLET | ORAL | 1 refills | Status: DC

## 2019-08-12 MED ORDER — ROSUVASTATIN CALCIUM 5 MG PO TABS: 5.0000 mg | ORAL_TABLET | Freq: Every day | ORAL | 1 refills | Status: DC

## 2019-08-12 MED ORDER — ACCU-CHEK AVIVA PLUS VI STRP: ORAL_STRIP | 1 refills | Status: DC

## 2019-08-12 MED ORDER — METFORMIN HCL 500 MG PO TABS: 1000.0000 mg | ORAL_TABLET | Freq: Every day | ORAL | 1 refills | Status: DC

## 2019-08-12 NOTE — Progress Notes (Signed)
Provider: [name]   Sierra Ray, Sierra Bucks, NP  Patient Care Team: [name], Sierra Bucks, NP as PCP - General (Family Medicine) Dorothy Spark, MD as Consulting Physician (Cardiology) Clent Jacks, MD as Consulting Physician (Ophthalmology)  Extended Emergency Contact Information Primary Emergency Contact: [name],[name] Address: [address], [place] Phone: [phone] Relation: Son  Code Status: Full Code  Goals of care: Advanced Directive information Advanced Directives 08/12/2019  Does Patient Have a Medical Advance Directive? Yes  Type of Advance Directive Living will  Does patient want to make changes to medical advance directive? No - Patient declined  Copy of [place] in Chart? -     Chief Complaint  Patient presents with  . Medical Management of Chronic Issues    43-monthfollowup with labs, C/O GI complaints of gas, heartburn. Also complains that she has no saliva and having difficulty swallowing. Also c/o insomnia.    HPI:  Pt is a 74y.o. female seen today for 6 months follow up for medical management of chronic diseases.she complains of gas and heartburn.Has had pain on upper abdomen when she drinks water feels like " it's dripping on an  Ulcer".Takes Omeprazole 40 mg capsule daily. Has completed her COVID-19 moderna vaccine.Reports no side effects.   Also complains very dry mouth worst when she wakes up at night and mornings states unable to swallow.she has used a humidify at bedtime with some relief.she drinks plenty of water throughout the day. On loratadine 10 mg tablet daily for allergies though states not taking every day.  Insomnia - sleeping about 1-2 hours at night.Doesn't take naps.States her sleeplessness is frustrating and making her depressed.sometimes does not feel like getting out of bed.she has not been able to go out and walk/exercise which she likes due to feeling tired in the  mornings. Has tried Melatonin 10 mg tablet and Ambien but both kept her awake.  Bilateral knee pain - right knee worst than the left.recently bought a brace though realized later that brace had CBD oil wants to know whether it's okay to use for knee pain.States was told by specialist needs knee replacement but wants to defer for now not ready.walks with a cane but states sometimes knee wants to give out.   Hypertension - no blood pressure log for review.she denies any signs of  Hypotension,syncope,chest pain or palpitation.On Amlodipine 5 mg tablet daily and Metoprolol 12.5 mg tablet twice daily.she follows up with Cardiologist [name] palpitation.  Not on ASA due to allergies.  Type 2 DM - checks CBG daily no log for review though recalls most readings in the 100's -130's.Latest Hgb A1C higher than previous level 6.9 previous 6.6.On metformin 500 mg tablet 24 hr takes 1000 mg tablet daily.Discussed discontinue metformin 500 mg tablet 24 Hr tablet due to nation wide recall due to concerns for carcinogens present in the tablet.will switch to metformin 1000 mg tablet daily. Had referral to Podiatrist but has not been seen.states will schedule appointment.Also due for annual eye exam decline referral states would like to switch to another eye doctor but will call back to notify office of the opthalmology name.states needs cataract surgery. She due for microAlbumin creatine ration.Not on ACEI or ARB.recent CR 1.1 slightly high compared to previous 1.05.  Hyperlipidemia - Lab worker reviewed and discussed with patient during visit total cholesterol,HDL and LDL are normal.Trigylcerides are high at 173  though stable compared to previous level.States due to her luck of sleep and feeling depressed has been eating chips and two slices of breath instead of one. Also not exercising three times as she used to.will resume her exercises once she sleep well at night. On rosuvastatin 5 mg tablet daily and  Omega-3 fatty Acids 1000 mg capsule 2 capsule twice daily.    Constipation - miralax ineffective.Drinks plenty of water and includes veggies in diet.  Season allergies - states symptoms under control on Flonase 50 mcg/ACT nasal spray and Loratadine 10 mg tablet.    Past Medical History:  Diagnosis Date  . Abnormal Pap smear 05/08/2011   ASC-US +HPV  . Arthritis   . Asthma   . Constipation   . Diabetes mellitus type 2 with neurological manifestations ([place])   . Diabetic peripheral neuropathy ([place])   . Generalized osteoarthritis   . GERD (gastroesophageal reflux disease)   . Hx of seasonal allergies   . Hyperlipidemia   . Hypertension   . IBS (irritable bowel syndrome)   . S/P total hysterectomy 06/21/2011   Past Surgical History:  Procedure Laterality Date  . EYE SURGERY Right 01/2018   Removed raised area on eye. Dr.Groat   . OVARIAN CYST SURGERY  1986  . PELVIC LAPAROSCOPY    . TONSILECTOMY, ADENOIDECTOMY, BILATERAL MYRINGOTOMY AND TUBES  1974  . VAGINAL HYSTERECTOMY  1983    Allergies  Allergen Reactions  . Aspirin   . Pravastatin Sodium Other (See Comments)    Muscle aches    Allergies as of 08/12/2019      Reactions   Aspirin    Pravastatin Sodium Other (See Comments)   Muscle aches      Medication List       Accurate as of August 12, 2019 10:18 AM. If you have any questions, ask your nurse or doctor.        STOP taking these medications   B-D SINGLE USE SWABS REGULAR Pads Stopped by: Sierra Ray Jastin Fore, NP   metFORMIN 500 MG 24 hr tablet Commonly known as: GLUCOPHAGE-XR Stopped by: [name], NP   penicillin v potassium 500 MG tablet Commonly known as: VEETID Stopped by: [name], NP     TAKE these medications   Accu-Chek Aviva Plus test strip Generic drug: glucose blood USE TO TEST BLOOD SUGAR TWICE DAILY   Accu-Chek Aviva Plus w/Device Kit Use to test blood sugar twice daily. Dx E11.42   Accu-Chek Softclix Lancets lancets USE   TO TEST BLOOD SUGAR ONE TIME DAILY FOR DIABETES   Align 4 MG Caps Take 1 capsule by mouth daily.   amLODipine 5 MG tablet Commonly known as: NORVASC TAKE 1 TABLET(5 MG) BY MOUTH DAILY   cholecalciferol 1000 units tablet Commonly known as: VITAMIN D Take 1,000 Units by mouth daily.   Fish Oil 1000 MG Caps Take 2 capsules by mouth 2 (two) times a week.   fluticasone 50 MCG/ACT nasal spray Commonly known as: FLONASE Place 2 sprays into both nostrils as needed.   loratadine 10 MG tablet Commonly known as: CLARITIN Take 10 mg by mouth daily.   metoprolol tartrate 25 MG tablet Commonly known as: LOPRESSOR Take 0.5 tablets (12.5 mg total) by mouth 2 (two) times daily.   multivitamin-lutein Caps capsule Take 1 capsule by mouth daily. What changed: Another medication with the same name was removed. Continue taking this medication, and follow the directions you see here. Changed by: [name], NP  multivitamin with minerals tablet Take 1 tablet by mouth daily. What changed: Another medication with the same name was removed. Continue taking this medication, and follow the directions you see here. Changed by: [name], NP   omeprazole 40 MG capsule Commonly known as: PRILOSEC Take 40 mg by mouth daily.   OVER THE COUNTER MEDICATION Place 1 drop into both eyes 2 (two) times daily as needed. For dry eyes. Equate Brand Restore Tears Lubricant Eye Drops   polyethylene glycol 17 g packet Commonly known as: MIRALAX / GLYCOLAX Take 17 g by mouth daily as needed. For constipation   rosuvastatin 5 MG tablet Commonly known as: CRESTOR Take 5 mg by mouth daily.       Review of Systems  Constitutional: Negative for appetite change, chills, fatigue and fever.  HENT: Negative for congestion, postnasal drip, rhinorrhea, sinus pressure, sinus pain, sneezing and sore throat.        Dry mouth worst at night   Eyes: Positive for visual disturbance. Negative for discharge,  redness and itching.       Wears eye glasses will call for referral to Ophthalmology   Respiratory: Negative for cough, chest tightness, shortness of breath and wheezing.   Cardiovascular: Negative for chest pain, palpitations and leg swelling.  Gastrointestinal: Negative for abdominal distention, constipation, diarrhea, nausea and vomiting.       Epigastric pain   Endocrine: Negative for cold intolerance, heat intolerance, polydipsia, polyphagia and polyuria.  Genitourinary: Negative for difficulty urinating, dysuria, flank pain, frequency, urgency, vaginal bleeding and vaginal discharge.  Musculoskeletal: Positive for arthralgias and gait problem. Negative for joint swelling and myalgias.       Bilateral  knee pain worst on right knee.   Skin: Negative for color change, pallor, rash and wound.  Neurological: Negative for dizziness, speech difficulty, weakness, light-headedness, numbness and headaches.  Hematological: Does not bruise/bleed easily.  Psychiatric/Behavioral: Positive for sleep disturbance. Negative for agitation, behavioral problems, self-injury and suicidal ideas. The patient is not nervous/anxious.     Immunization History  Administered Date(s) Administered  . Fluad Quad(high Dose 65+) 02/11/2019  . Influenza, High Dose Seasonal PF 01/28/2018  . Influenza,inj,Quad PF,6+ Mos 02/03/2016, 01/23/2017  . Moderna SARS-COVID-2 Vaccination 06/22/2019, 07/24/2019  . Pneumococcal Conjugate-13 07/30/2014  . Pneumococcal Polysaccharide-23 11/17/2005, 05/20/2012  . Td 08/18/2010   Pertinent  Health Maintenance Due  Topic Date Due  . FOOT EXAM  01/23/2018  . URINE MICROALBUMIN  07/25/2018  . OPHTHALMOLOGY EXAM  02/26/2019  . MAMMOGRAM  10/01/2019  . INFLUENZA VACCINE  11/15/2019  . HEMOGLOBIN A1C  02/06/2020  . COLONOSCOPY  09/05/2025  . DEXA SCAN  Completed  . PNA vac Low Risk Adult  Completed   Fall Risk  08/12/2019 03/04/2019 02/11/2019 01/28/2018 07/24/2017  Falls in the  past year? 0 0 0 No No  Comment Feels off balance most of the time, but no falls - - - -  Number falls in past yr: - 0 - - -  Injury with Fall? - 0 - - -    Vitals:   08/12/19 0926  BP: 124/70  Pulse: 73  Temp: (!) 97.3 F (36.3 C)  TempSrc: Temporal  SpO2: 97%  Weight: 155 lb 9.6 oz (70.6 kg)  Height: '5\' 5"'  (1.651 m)   Body mass index is 25.89 kg/m. Physical Exam Vitals reviewed.  Constitutional:      General: She is not in acute distress.    Appearance: She is overweight. She is not  ill-appearing.  HENT:     Head: Normocephalic.     Right Ear: Tympanic membrane, ear canal and external ear normal. There is no impacted cerumen.     Left Ear: Tympanic membrane, ear canal and external ear normal. There is no impacted cerumen.     Nose: Nose normal. No congestion or rhinorrhea.     Mouth/Throat:     Mouth: Mucous membranes are moist.     Pharynx: Oropharynx is clear. No oropharyngeal exudate or posterior oropharyngeal erythema.  Eyes:     General: No scleral icterus.       Right eye: No discharge.        Left eye: No discharge.     Extraocular Movements: Extraocular movements intact.     Conjunctiva/sclera: Conjunctivae normal.     Pupils: Pupils are equal, round, and reactive to light.     Comments: Corrective lens in place   Neck:     Vascular: No carotid bruit.  Cardiovascular:     Rate and Rhythm: Normal rate and regular rhythm.     Pulses: Normal pulses.     Heart sounds: Normal heart sounds. No murmur. No friction rub. No gallop.      Comments: Right leg dilated greater saphenous vein and left thigh posterior small saphenous vein dilation noted.Non -tender to palpation.No ulceration or edema noted.  Pulmonary:     Effort: Pulmonary effort is normal. No respiratory distress.     Breath sounds: Normal breath sounds. No wheezing, rhonchi or rales.  Chest:     Chest wall: No tenderness.  Abdominal:     General: Bowel sounds are normal. There is no distension.      Palpations: Abdomen is soft. There is no mass.     Tenderness: There is abdominal tenderness in the epigastric area. There is no right CVA tenderness, left CVA tenderness, guarding or rebound.  Musculoskeletal:        General: No swelling.     Cervical back: Normal range of motion. No rigidity or tenderness.     Right knee: No swelling, erythema, ecchymosis or crepitus. Normal range of motion. Tenderness present over the medial joint line.     Left knee: No swelling, erythema, ecchymosis or crepitus. Normal range of motion. No tenderness.     Right lower leg: No edema.     Left lower leg: No edema.     Comments: Unsteady gait walks with a cane.  Lymphadenopathy:     Cervical: No cervical adenopathy.  Skin:    General: Skin is warm.     Coloration: Skin is not pale.     Findings: No bruising, erythema or rash.  Neurological:     Mental Status: She is alert and oriented to person, place, and time.     Cranial Nerves: No cranial nerve deficit.     Motor: No weakness.     Coordination: Coordination normal.     Gait: Gait abnormal.  Psychiatric:        Mood and Affect: Mood normal.        Behavior: Behavior normal.        Thought Content: Thought content normal.        Judgment: Judgment normal.    Labs reviewed: Recent Labs    12/10/18 0000 08/07/19 0801  NA 142 142  K 4.1 4.0  CL  --  105  CO2  --  26  GLUCOSE  --  138*  BUN 25* 20  CREATININE 1.1 1.05*  CALCIUM  --  9.0   Recent Labs    12/10/18 0000 08/07/19 0801  AST 14 18  ALT 8 12  ALKPHOS 55  --   BILITOT  --  0.4  PROT  --  6.5   Recent Labs    12/10/18 0000 08/07/19 0801  WBC 6.1 5.5  NEUTROABS 1,903 1,837  HGB 13.8 13.3  HCT 42 39.9  MCV  --  85.4  PLT 188 186   Lab Results  Component Value Date   TSH 1.64 08/07/2019   Lab Results  Component Value Date   HGBA1C 6.9 (H) 08/07/2019   Lab Results  Component Value Date   CHOL 154 08/07/2019   HDL 57 08/07/2019   LDLCALC 72 08/07/2019    TRIG 173 (H) 08/07/2019   CHOLHDL 2.7 08/07/2019    Significant Diagnostic Results in last 30 days:  No results found.  Assessment/Plan 1. Type 2 diabetes mellitus with diabetic polyneuropathy, without long-term current use of insulin (HCC) Lab Results  Component Value Date   HGBA1C 6.9 (H) 08/07/2019  CBG stable. - Advised to cut down on sweets and carbohydrates intake. - Encouraged to resume her exercises classes and walking exercise. - Discontinue Metformin 500 mg tablet 24 Hr tablet 2 tablet daily due to recall concerning for carcinogens. - Start on metformin 1000 mg tablet one by mouth daily.  - glucose blood (ACCU-CHEK AVIVA PLUS) test strip; USE TO TEST BLOOD SUGAR TWICE DAILY  Dispense: 200 each; Refill: 1 - Accu-Chek Softclix Lancets lancets; USE  TO TEST BLOOD SUGAR ONE TIME DAILY FOR DIABETES  Dispense: 100 each; Refill: 6 - TSH; Future - Hgb A1C,future  - metFORMIN (GLUCOPHAGE) 500 MG tablet; Take 2 tablets (1,000 mg total) by mouth daily with breakfast.  Dispense: 90 tablet; Refill: 1 - Microalbumin / creatinine urine ratio - follow up with podiatrist as referred - call and notify provider Ophthalmologist name that you requested to be referred to.  - continue on Statin for CV event prevention.Not on ASA due to allergies.   2. Hyperlipidemia associated with type 2 diabetes mellitus ([place]) LDL at goal.TRG elevated.Discussed diet and exercise as above. - continue on Crestor 5 mg tablet daily.  - rosuvastatin (CRESTOR) 5 MG tablet; Take 1 tablet (5 mg total) by mouth daily.  Dispense: 90 tablet; Refill: 1 - Lipid,future   3. Hypertension associated with diabetes ([place]) B/p at goal.continue on Amlodipine 5 mg tablet and Metoprolol 12.5 mg tablet twice daily. - Dietary and lifestyle modification  - amLODipine (NORVASC) 5 MG tablet; Take 1 tablet daily  Dispense: 90 tablet; Refill: 1  - CBC with Differential/Platelet; Future - CMP with eGFR([facility]); Future - TSH;  Future  4. Gastroesophageal reflux disease without esophagitis Symptoms uncontrolled on Omeprazole 40 mg capsule daily.reports epigastric pain.request to see previous GI specialist Dr.Hung Saralyn Pilar for further evaluation. Hgb/HCT stable.No dark or blood in the stool.  - Ambulatory referral to Gastroenterology  5. Dry mouth - worst at night and mornings. - Encouraged to reduce the heat in the house - use a humidify at bedtime and sips water to keep mouth moist frequently.   6. Insomnia, unspecified type Sleeps 1-2 hrs which affects her day does not feel like getting up in the morning this is depressing her. - traZODone (DESYREL) 50 MG tablet; Take 0.5-1 tablets (25-50 mg total) by mouth at bedtime as needed for sleep.  Dispense: 30 tablet; Refill: 3  7. Encounter for hepatitis C screening test for low risk  patient Low risk. - Hep C Antibody; Future   8. Slow transit constipation miralax ineffective.Abd exam normal.  - continue to include veggies in diet and increase water intake - Add Senokot -S as below.continue Miralax for now may holds for loose stool.  - senna-docusate (SENOKOT-S) 8.6-50 MG tablet; Take 1 tablet by mouth at bedtime.  Dispense: 90 tablet; Refill: 3  9. Seasonal allergies Continue on Flonase and loratadine.   10. Unsteady gait Continue to use a cane.Fall and safety precaution.  11. Chronic pain of both knees Not ready for referral to Orthopedic will notify provider.  Continue on tylenol and right knee brace.   Family/ staff Communication: Reviewed plan of care with patient verbalized understanding.   Labs/tests ordered:  - CBC with Differential/Platelet; Future - CMP with eGFR([facility]); Future - TSH; Future - Hgb A1C,future  - Hep C Antibody; Future  - Lipid,future  Next Appointment : 6 months for medical management of chronic issues.  [name], NP

## 2019-08-13 LAB — MICROALBUMIN / CREATININE URINE RATIO
Creatinine, Urine: 52 mg/dL (ref 20–275)
Microalb Creat Ratio: 4 mcg/mg creat (ref <30)
Microalb, Ur: 0.2 mg/dL

## 2019-08-14 ENCOUNTER — Telehealth: Payer: Self-pay | Admitting: Family

## 2019-08-14 DIAGNOSIS — E1142 Type 2 diabetes mellitus with diabetic polyneuropathy: Secondary | ICD-10-CM

## 2019-08-14 NOTE — Telephone Encounter (Signed)
Pt would like referral to previous opthamologist [name] for eye exam.  Thanks, Vilinda Blanks.

## 2019-08-14 NOTE — Telephone Encounter (Signed)
Refer to previous Ophthalmologist per patient's request

## 2019-08-19 DIAGNOSIS — R1013 Epigastric pain: Secondary | ICD-10-CM | POA: Diagnosis not present

## 2019-08-19 DIAGNOSIS — R140 Abdominal distension (gaseous): Secondary | ICD-10-CM | POA: Diagnosis not present

## 2019-08-19 DIAGNOSIS — K219 Gastro-esophageal reflux disease without esophagitis: Secondary | ICD-10-CM | POA: Diagnosis not present

## 2019-08-27 ENCOUNTER — Encounter: Payer: Self-pay | Admitting: Cardiology

## 2019-08-27 ENCOUNTER — Other Ambulatory Visit: Payer: Self-pay

## 2019-08-27 ENCOUNTER — Ambulatory Visit: Payer: Medicare HMO | Admitting: Cardiology

## 2019-08-27 VITALS — BP 114/64 | HR 72 | Ht 66.0 in | Wt 155.8 lb

## 2019-08-27 DIAGNOSIS — R002 Palpitations: Secondary | ICD-10-CM | POA: Diagnosis not present

## 2019-08-27 DIAGNOSIS — R072 Precordial pain: Secondary | ICD-10-CM

## 2019-08-27 DIAGNOSIS — E782 Mixed hyperlipidemia: Secondary | ICD-10-CM | POA: Diagnosis not present

## 2019-08-27 DIAGNOSIS — I739 Peripheral vascular disease, unspecified: Secondary | ICD-10-CM

## 2019-08-27 DIAGNOSIS — I10 Essential (primary) hypertension: Secondary | ICD-10-CM

## 2019-08-27 NOTE — Patient Instructions (Signed)
Medication Instructions:   Your physician recommends that you continue on your current medications as directed. Please refer to the Current Medication list given to you today.  *If you need a refill on your cardiac medications before your next appointment, please call your pharmacy*  Testing/Procedures:  Your physician has requested that you have a lower extremity arterial duplex. This test is an ultrasound of the arteries in the legs or arms. It looks at arterial blood flow in the legs and arms. Allow one hour for Lower and Upper Arterial scans. There are no restrictions or special instructions   Your cardiac CT will be scheduled at one of the below locations:   [facility] [address] [place],  [address] [phone]  If scheduled at [facility], please arrive at the [facility] main entrance of [facility] 30 minutes prior to test start time. Proceed to the [facility] Radiology Department (first floor) to check-in and test prep.  Please follow these instructions carefully (unless otherwise directed):  On the Night Before the Test: . Be sure to Drink plenty of water. . Do not consume any caffeinated/decaffeinated beverages or chocolate 12 hours prior to your test. . Do not take any antihistamines 12 hours prior to your test. . If you take Metformin do not take 24 hours prior to test.  On the Day of the Test: . Drink plenty of water. Do not drink any water within one hour of the test. . Do not eat any food 4 hours prior to the test. . You may take your regular medications prior to the test.  . Take your metoprolol 25 mg by mouth (Lopressor) two hours prior to test. . FEMALES- please wear underwire-free bra if available      After the Test: . Drink plenty of water. . After receiving IV contrast, you may experience a mild flushed feeling. This is normal. . On occasion, you may experience a mild rash up to 24 hours after the test. This is  not dangerous. If this occurs, you can take Benadryl 25 mg and increase your fluid intake. . If you experience trouble breathing, this can be serious. If it is severe call 911 IMMEDIATELY. If it is mild, please call our office. . If you take any of these medications: Glipizide/Metformin, Avandament, Glucavance, please do not take 48 hours after completing test unless otherwise instructed.  Once we have confirmed authorization from your insurance company, we will call you to set up a date and time for your test.   For non-scheduling related questions, please contact the cardiac imaging nurse navigator should you have any questions/concerns: Marchia Bond, Cardiac Imaging Nurse Navigator Burley Saver, Interim Cardiac Imaging Nurse [place] [facility] Direct Office Dial: [phone]   For scheduling needs, including cancellations and rescheduling, please call [phone].    Follow-Up: At [facility], you and your health needs are our priority.  As part of our continuing mission to provide you with exceptional heart care, we have created designated Provider Care Teams.  These Care Teams include your primary Cardiologist (physician) and Advanced Practice Providers (APPs -  Physician Assistants and Nurse Practitioners) who all work together to provide you with the care you need, when you need it.  We recommend signing up for the patient portal called "MyChart".  Sign up information is provided on this After Visit Summary.  MyChart is used to connect with patients for Virtual Visits (Telemedicine).  Patients are able  to view lab/test results, encounter notes, upcoming appointments, etc.  Non-urgent messages can be sent to your provider as well.   To learn more about what you can do with MyChart, go to [email or web address].    Your next appointment:   6 month(s)  The format for your next appointment:   In Person  Provider:   Ena Dawley, MD

## 2019-08-27 NOTE — Progress Notes (Signed)
Cardiology Office Note:    Date:  08/27/2019   ID:  Sierra Rembert V., DOB 10-26-1945, MRN [id number]  PCP:  [name], NP  Cardiologist:  No primary care provider on file.  Electrophysiologist:  None   Referring MD: [name], NP   Reason for visit: Chest pain, claudications  History of Present Illness:    Sierra Donson V. is a 74 y.o. female with a hx of palpitations and worsening DOE. Her EKG was completely normal and she underwent pharmacologic nuclear stress testing in 2015 that showed normal EF and no ischemia or prior infarct. I don't see that she wore an ambulatory monitor, but pt reports that she wore one for 24 hrs and findings were "ok". Last year when evalauted, she complained of chest pain that was c/w costochondritis, which Dr. [name] recommended a course of anti inflammatory medications. Additional medical problems include HTN, HLD and varicose veins, followed by Dr. Trula Slade.   08/27/2019 -the patient is coming after 2 years, she states that her palpitations have almost resolved she only gets them occasionally and they are not associated with shortness of breath, chest pain dizziness, no history of syncope.  She continues to experience right-sided chest pain that can be at rest or on exertion.  She really likes to walk and walks at least 3 times a week and she has been experiencing cough cramping after walking short distances.  This has been getting worse over the last couple of months.  She has been compliant with her medications and has no side effects.   Past Medical History:  Diagnosis Date  . Abnormal Pap smear 05/08/2011   ASC-US +HPV  . Arthritis   . Asthma   . Constipation   . Diabetes mellitus type 2 with neurological manifestations ([place])   . Diabetic peripheral neuropathy ([place])   . Generalized osteoarthritis   . GERD (gastroesophageal reflux disease)   . Hx of seasonal allergies   . Hyperlipidemia   . Hypertension   . IBS (irritable  bowel syndrome)   . S/P total hysterectomy 06/21/2011    Past Surgical History:  Procedure Laterality Date  . EYE SURGERY Right 01/2018   Removed raised area on eye. Dr.Groat   . OVARIAN CYST SURGERY  1986  . PELVIC LAPAROSCOPY    . TONSILECTOMY, ADENOIDECTOMY, BILATERAL MYRINGOTOMY AND TUBES  1974  . VAGINAL HYSTERECTOMY  1983    Current Medications: Current Meds  Medication Sig  . Accu-Chek Softclix Lancets lancets USE  TO TEST BLOOD SUGAR ONE TIME DAILY FOR DIABETES  . amLODipine (NORVASC) 5 MG tablet Take 1 tablet daily  . Blood Glucose Monitoring Suppl (ACCU-CHEK AVIVA PLUS) w/Device KIT Use to test blood sugar twice daily. Dx E11.42  . cholecalciferol (VITAMIN D) 1000 UNITS tablet Take 1,000 Units by mouth daily.   . fluticasone (FLONASE) 50 MCG/ACT nasal spray Place 2 sprays into both nostrils as needed.   Marland Kitchen glucose blood (ACCU-CHEK AVIVA PLUS) test strip USE TO TEST BLOOD SUGAR TWICE DAILY  . loratadine (CLARITIN) 10 MG tablet Take 10 mg by mouth daily.  . metFORMIN (GLUCOPHAGE) 500 MG tablet Take 2 tablets (1,000 mg total) by mouth daily with breakfast.  . metoprolol tartrate (LOPRESSOR) 25 MG tablet Take 0.5 tablets (12.5 mg total) by mouth 2 (two) times daily.  . Multiple Vitamins-Minerals (MULTIVITAMIN WITH MINERALS) tablet Take 1 tablet by mouth daily.  . multivitamin-lutein (OCUVITE-LUTEIN) CAPS capsule Take 1 capsule by mouth daily.  . Omega-3 Fatty Acids (FISH  OIL) 1000 MG CAPS Take 2 capsules by mouth 2 (two) times a week.   Marland Kitchen omeprazole (PRILOSEC) 40 MG capsule Take 40 mg by mouth daily.  Marland Kitchen OVER THE COUNTER MEDICATION Place 1 drop into both eyes 2 (two) times daily as needed. For dry eyes. Equate Brand Restore Tears Lubricant Eye Drops   . polyethylene glycol (MIRALAX / GLYCOLAX) packet Take 17 g by mouth daily as needed. For constipation  . Probiotic Product (ALIGN) 4 MG CAPS Take 1 capsule by mouth daily.   . rosuvastatin (CRESTOR) 5 MG tablet Take 1 tablet (5 mg  total) by mouth daily.  Marland Kitchen senna-docusate (SENOKOT-S) 8.6-50 MG tablet Take 1 tablet by mouth at bedtime.  . traZODone (DESYREL) 50 MG tablet Take 0.5-1 tablets (25-50 mg total) by mouth at bedtime as needed for sleep.     Allergies:   Aspirin and Pravastatin sodium   Social History   Socioeconomic History  . Marital status: Single    Spouse name: Not on file  . Number of children: 4  . Years of education: Not on file  . Highest education level: Not on file  Occupational History    Employer: RETIRED  Tobacco Use  . Smoking status: Never Smoker  . Smokeless tobacco: Never Used  Substance and Sexual Activity  . Alcohol use: No  . Drug use: No  . Sexual activity: Not Currently    Birth control/protection: Surgical    Comment: 1st intercourse- 20, partners- 8,   Other Topics Concern  . Not on file  Social History Narrative   Diet?    Low fat, low carb, low salt      Do you drink/eat things with caffeine?      Marital status?  single                                  What year were you married?      Do you live in a house, apartment, assisted living, condo, trailer, etc.?      Is it one or more stories?      How many persons live in your home?      Do you have any pets in your home? (please list)      Current or past profession:      Do you exercise?   yes                 Type & how often? Yoga, water aerobics 1-2x/week      Do you have a living will?      Do you have a DNR form?                                  If not, do you want to discuss one?      Do you have signed POA/HPOA for forms?       Social Determinants of Health   Financial Resource Strain:   . Difficulty of Paying Living Expenses:   Food Insecurity:   . Worried About Charity fundraiser in the Last Year:   . Arboriculturist in the Last Year:   Transportation Needs:   . Film/video editor (Medical):   Marland Kitchen Lack of Transportation (Non-Medical):   Physical Activity:   . Days of Exercise per Week:    . Minutes of  Exercise per Session:   Stress:   . Feeling of Stress :   Social Connections:   . Frequency of Communication with Friends and Family:   . Frequency of Social Gatherings with Friends and Family:   . Attends Religious Services:   . Active Member of Clubs or Organizations:   . Attends Archivist Meetings:   Marland Kitchen Marital Status:      Family History: The patient's family history includes Breast cancer in her cousin and maternal aunt; Cancer in her mother and paternal grandmother; Diabetes in her father and mother; Heart disease in her father; Pancreatic cancer in her brother and sister.  ROS:   Please see the history of present illness.    All other systems reviewed and are negative.  EKGs/Labs/Other Studies Reviewed:    The following studies were reviewed today:  EKG:  EKG is ordered today.  The ekg ordered today demonstrates normal sinus rhythm, 72 bpm, normal EKG unchanged from prior, this has been personally reviewed.  TTE: 07/16/2017  Left ventricle: The cavity size was normal. There was mild  concentric hypertrophy. Systolic function was normal. The  estimated ejection fraction was in the range of 60% to 65%. Wall  motion was normal; there were no regional wall motion  abnormalities. Doppler parameters are consistent with abnormal  left ventricular relaxation (grade 1 diastolic dysfunction).  Doppler parameters are consistent with high ventricular filling  pressure.  - Aortic valve: Transvalvular velocity was within the normal range.  There was no stenosis. There was no regurgitation.  - Mitral valve: Transvalvular velocity was within the normal range.  There was no evidence for stenosis. There was trivial  regurgitation.  - Right ventricle: The cavity size was normal. Wall thickness was  normal. Systolic function was normal.  - Atrial septum: No defect or patent foramen ovale was identified  by color flow Doppler.  - Tricuspid  valve: There was trivial regurgitation.  - Pulmonary arteries: Systolic pressure was within the normal  range. PA peak pressure: 28 mm Hg (S).   Recent Labs: 08/07/2019: ALT 12; BUN 20; Creat 1.05; Hemoglobin 13.3; Platelets 186; Potassium 4.0; Sodium 142; TSH 1.64  Recent Lipid Panel    Component Value Date/Time   CHOL 154 08/07/2019 0801   CHOL 139 09/26/2015 0837   TRIG 173 (H) 08/07/2019 0801   HDL 57 08/07/2019 0801   HDL 57 09/26/2015 0837   CHOLHDL 2.7 08/07/2019 0801   VLDL 29 07/03/2016 0825   LDLCALC 72 08/07/2019 0801    Physical Exam:    VS:  BP 114/64   Pulse 72   Ht '5\' 6"'  (1.676 m)   Wt 155 lb 12.8 oz (70.7 kg)   SpO2 98%   BMI 25.15 kg/m     Wt Readings from Last 3 Encounters:  08/27/19 155 lb 12.8 oz (70.7 kg)  08/12/19 155 lb 9.6 oz (70.6 kg)  03/04/19 155 lb 6.4 oz (70.5 kg)     GEN:  Well nourished, well developed in no acute distress HEENT: Normal NECK: No JVD; No carotid bruits LYMPHATICS: No lymphadenopathy CARDIAC: RRR, no murmurs, rubs, gallops RESPIRATORY:  Clear to auscultation without rales, wheezing or rhonchi  ABDOMEN: Soft, non-tender, non-distended MUSCULOSKELETAL:  No edema; No deformity  SKIN: Warm and dry NEUROLOGIC:  Alert and oriented x 3 PSYCHIATRIC:  Normal affect  There are weak pulses in the right lower extremity and nonpalpable in the left lower extremity.  ASSESSMENT:    1. Claudication of both  lower extremities ([place])   2. Precordial pain   3. Palpitations    PLAN:    In order of problems listed above:  1. Palpitations -this is improved, the patient is asking if she can possibly discontinue metoprolol, and yes she can try to discontinue and see if she has recurrent palpitations. 2. Claudications -these are new, she has weak pulses in the right lower extremity and nonpalpable pulses on the left.  We will place an order for bilateral arterial duplex. 3. Chest pain -with some typical and atypical features however  multiple risk factors including diabetes, hypertension, hyperlipidemia and family history of coronary artery disease in both parents, her father had bypass surgery in his 12s and mother had myocardial infarction in her 90s.  We will order coronary CTA to evaluate her coronary anatomy and possible obstructive CAD. 4. Hypertension -well-controlled on current regimen 5. Hyperlipidemia she is taking low-dose rosuvastatin and her lipids are at goal except for borderline triglycerides.   Medication Adjustments/Labs and Tests Ordered: Current medicines are reviewed at length with the patient today.  Concerns regarding medicines are outlined above.  Orders Placed This Encounter  Procedures  . CT CORONARY MORPH W/CTA COR W/SCORE W/CA W/CM &/OR WO/CM  . CT CORONARY FRACTIONAL FLOW RESERVE DATA PREP  . CT CORONARY FRACTIONAL FLOW RESERVE FLUID ANALYSIS  . Basic metabolic panel  . EKG 12-Lead  . VAS [place] LOWER EXTREMITY ARTERIAL DUPLEX   No orders of the defined types were placed in this encounter.   Patient Instructions  Medication Instructions:   Your physician recommends that you continue on your current medications as directed. Please refer to the Current Medication list given to you today.  *If you need a refill on your cardiac medications before your next appointment, please call your pharmacy*  Testing/Procedures:  Your physician has requested that you have a lower extremity arterial duplex. This test is an ultrasound of the arteries in the legs or arms. It looks at arterial blood flow in the legs and arms. Allow one hour for Lower and Upper Arterial scans. There are no restrictions or special instructions   Your cardiac CT will be scheduled at one of the below locations:   [facility] [address] [place], [place] [address] [phone]  If scheduled at [facility], please arrive at the [facility] main entrance of [facility] 30 minutes prior to test  start time. Proceed to the [facility] Radiology Department (first floor) to check-in and test prep.  Please follow these instructions carefully (unless otherwise directed):  On the Night Before the Test: . Be sure to Drink plenty of water. . Do not consume any caffeinated/decaffeinated beverages or chocolate 12 hours prior to your test. . Do not take any antihistamines 12 hours prior to your test. . If you take Metformin do not take 24 hours prior to test.  On the Day of the Test: . Drink plenty of water. Do not drink any water within one hour of the test. . Do not eat any food 4 hours prior to the test. . You may take your regular medications prior to the test.  . Take your metoprolol 25 mg by mouth (Lopressor) two hours prior to test. . FEMALES- please wear underwire-free bra if available      After the Test: . Drink plenty of water. . After receiving IV contrast, you may experience a mild flushed feeling. This is normal. . On occasion, you may experience a  mild rash up to 24 hours after the test. This is not dangerous. If this occurs, you can take Benadryl 25 mg and increase your fluid intake. . If you experience trouble breathing, this can be serious. If it is severe call 911 IMMEDIATELY. If it is mild, please call our office. . If you take any of these medications: Glipizide/Metformin, Avandament, Glucavance, please do not take 48 hours after completing test unless otherwise instructed.  Once we have confirmed authorization from your insurance company, we will call you to set up a date and time for your test.   For non-scheduling related questions, please contact the cardiac imaging nurse navigator should you have any questions/concerns: Marchia Bond, Cardiac Imaging Nurse Navigator Burley Saver, Interim Cardiac Imaging Nurse [place] and Vascular Services Direct Office Dial: [phone]   For scheduling needs, including cancellations and rescheduling, please  call [phone].    Follow-Up: At [facility], you and your health needs are our priority.  As part of our continuing mission to provide you with exceptional heart care, we have created designated Provider Care Teams.  These Care Teams include your primary Cardiologist (physician) and Advanced Practice Providers (APPs -  Physician Assistants and Nurse Practitioners) who all work together to provide you with the care you need, when you need it.  We recommend signing up for the patient portal called "MyChart".  Sign up information is provided on this After Visit Summary.  MyChart is used to connect with patients for Virtual Visits (Telemedicine).  Patients are able to view lab/test results, encounter notes, upcoming appointments, etc.  Non-urgent messages can be sent to your provider as well.   To learn more about what you can do with MyChart, go to [email or web address].    Your next appointment:   6 month(s)  The format for your next appointment:   In Person  Provider:   Ena Dawley, MD       Signed, Ena Dawley, MD  08/27/2019 9:31 AM    [place]

## 2019-08-31 ENCOUNTER — Other Ambulatory Visit (HOSPITAL_COMMUNITY): Payer: Self-pay | Admitting: Cardiology

## 2019-08-31 DIAGNOSIS — I739 Peripheral vascular disease, unspecified: Secondary | ICD-10-CM

## 2019-09-09 ENCOUNTER — Ambulatory Visit: Payer: Medicare HMO | Admitting: Podiatry

## 2019-09-16 ENCOUNTER — Ambulatory Visit (HOSPITAL_COMMUNITY)
Admission: RE | Admit: 2019-09-16 | Discharge: 2019-09-16 | Disposition: A | Discharge status: Home / Self Care | Payer: Medicare HMO | Source: Ambulatory Visit | Attending: Cardiology | Admitting: Cardiology

## 2019-09-16 ENCOUNTER — Other Ambulatory Visit: Payer: Self-pay

## 2019-09-16 DIAGNOSIS — I739 Peripheral vascular disease, unspecified: Secondary | ICD-10-CM | POA: Diagnosis not present

## 2019-09-16 DIAGNOSIS — R6889 Other general symptoms and signs: Secondary | ICD-10-CM | POA: Diagnosis not present

## 2019-09-16 DIAGNOSIS — R072 Precordial pain: Secondary | ICD-10-CM

## 2019-09-21 ENCOUNTER — Telehealth: Payer: Self-pay | Admitting: *Deleted

## 2019-09-21 MED ORDER — METOPROLOL TARTRATE 25 MG PO TABS: 12.5000 mg | ORAL_TABLET | Freq: Two times a day (BID) | ORAL | 1 refills | Status: DC

## 2019-09-21 NOTE — Telephone Encounter (Signed)
cardiac ct Received: Today Message Contents  Cam Hai, LPN; Zeb Comfort, RN  Patient scheduled for Friday 09/25/19 830a , having labs done on 09/23/19 -   Karlene Einstein can you call the metoprolol into the patients mail order pharmacy so that she can get it before the test ??    Pt already has metoprolol tartrate 12.5 mg po bid, that she takes on a scheduled basis.  She is very aware that she is to take the whole tablet of metoprolol tartrate 25 mg by mouth 2 hours prior to her CT, on the day of her Cardiac CT.  We do not need to send this into [facility] for a one time fill. I will however refill her scheduled metoprolol 12.5 mg po bid to [facility] for a 90 day supply, for I see this medication is almost out of refills. Pt confirmed that she still does have the copy of her Coronary CT instructions I typed and provided her at her last OV.  She is aware to follow those instructions carefully, to prepare for her scheduled Coronary CT.  Pt verbalized understanding and agrees with this plan.   Your cardiac CT will be scheduled at one of the below locations:   [facility] [address] [place], [place] [address] [phone]  If scheduled at [facility], please arrive at the [facility] main entrance of [facility] 30 minutes prior to test start time. Proceed to the [facility] Radiology Department (first floor) to check-in and test prep.  Please follow these instructions carefully (unless otherwise directed):  On the Night Before the Test:  Be sure to Drink plenty of water.  Do not consume any caffeinated/decaffeinated beverages or chocolate 12 hours prior to your test.  Do not take any antihistamines 12 hours prior to your test.  If you take Metformin do not take 24 hours prior to test.  On the Day of the Test:  Drink plenty of water. Do not drink any water within one hour of the test.  Do not eat any food 4 hours prior to the test.  You  may take your regular medications prior to the test.   Take your metoprolol 25 mg by mouth (Lopressor) two hours prior to test.  FEMALES- please wear underwire-free bra if available      After the Test:  Drink plenty of water.  After receiving IV contrast, you may experience a mild flushed feeling. This is normal.  On occasion, you may experience a mild rash up to 24 hours after the test. This is not dangerous. If this occurs, you can take Benadryl 25 mg and increase your fluid intake.  If you experience trouble breathing, this can be serious. If it is severe call 911 IMMEDIATELY. If it is mild, please call our office.  If you take any of these medications: Glipizide/Metformin, Avandament, Glucavance, please do not take 48 hours after completing test unless otherwise instructed.  Once we have confirmed authorization from your insurance company, we will call you to set up a date and time for your test.   For non-scheduling related questions, please contact the cardiac imaging nurse navigator should you have any questions/concerns: Marchia Bond, Cardiac Imaging Nurse Navigator Burley Saver, Interim Cardiac Imaging Nurse [place] and Vascular Services Direct Office Dial: [phone]   For scheduling needs, including cancellations and rescheduling, please call [phone].

## 2019-09-22 ENCOUNTER — Telehealth: Payer: Self-pay | Admitting: *Deleted

## 2019-09-22 NOTE — Telephone Encounter (Signed)
Dr. [name], below is a message received from our [place], about the pt cancelling her Coronary CT on 6/11, for the following information endorsed to her, from getting the COVID Vaccine. Just wanted to let you know. She was advised to push the CT out for 8 weeks, due to vaccination.     cancelled ct Received: Today Message Contents  Cam Hai, LPN  Patient is cancelling ct for 8 weeks , she just got her Covid Vaccine and they told her not to have any radiology testing for 8 weeks after?   [name]

## 2019-09-23 ENCOUNTER — Other Ambulatory Visit: Payer: Medicare HMO

## 2019-09-25 ENCOUNTER — Other Ambulatory Visit (HOSPITAL_COMMUNITY): Payer: Medicare HMO

## 2019-09-25 ENCOUNTER — Ambulatory Visit: Payer: Medicare HMO | Admitting: Podiatry

## 2019-10-01 ENCOUNTER — Telehealth: Payer: Self-pay | Admitting: *Deleted

## 2019-10-01 NOTE — Telephone Encounter (Signed)
Patient stated that she is having a cleaning and extraction. Stated that she has Mitral Valve Prolapse.  She will call her Dentist to have them call in antibiotic.

## 2019-10-01 NOTE — Telephone Encounter (Signed)
Patient called and stated that she has a dental procedure scheduled for July and is always PreMedicated before. Stated that she needs a script for an antibiotic called into [facility] before her appointment. Stated that she would like to go ahead and get it sent so she can receive it in time.  Please Advise.  (pended and sent to [facility] for approval due to [name] out of office)

## 2019-10-01 NOTE — Telephone Encounter (Signed)
What procedure is she having and why does she get medication prior do (what medical condition, diagnosis) is the dentist recommending this? If so they can call in antibiotic based on what she is having done.

## 2019-10-02 DIAGNOSIS — H2513 Age-related nuclear cataract, bilateral: Secondary | ICD-10-CM | POA: Diagnosis not present

## 2019-10-02 DIAGNOSIS — H40013 Open angle with borderline findings, low risk, bilateral: Secondary | ICD-10-CM | POA: Diagnosis not present

## 2019-10-02 DIAGNOSIS — H43811 Vitreous degeneration, right eye: Secondary | ICD-10-CM | POA: Diagnosis not present

## 2019-10-02 DIAGNOSIS — H16223 Keratoconjunctivitis sicca, not specified as Sjogren's, bilateral: Secondary | ICD-10-CM | POA: Diagnosis not present

## 2019-10-02 DIAGNOSIS — H25813 Combined forms of age-related cataract, bilateral: Secondary | ICD-10-CM | POA: Diagnosis not present

## 2019-10-02 DIAGNOSIS — H04123 Dry eye syndrome of bilateral lacrimal glands: Secondary | ICD-10-CM | POA: Diagnosis not present

## 2019-10-02 DIAGNOSIS — E119 Type 2 diabetes mellitus without complications: Secondary | ICD-10-CM | POA: Diagnosis not present

## 2019-10-02 DIAGNOSIS — H25013 Cortical age-related cataract, bilateral: Secondary | ICD-10-CM | POA: Diagnosis not present

## 2019-10-08 ENCOUNTER — Other Ambulatory Visit: Payer: Self-pay

## 2019-10-08 ENCOUNTER — Other Ambulatory Visit: Payer: Medicare HMO | Admitting: *Deleted

## 2019-10-08 DIAGNOSIS — R072 Precordial pain: Secondary | ICD-10-CM | POA: Diagnosis not present

## 2019-10-08 LAB — BASIC METABOLIC PANEL
BUN/Creatinine Ratio: 16 (ref 12–28)
BUN: 16 mg/dL (ref 8–27)
CO2: 25 mmol/L (ref 20–29)
Calcium: 9.5 mg/dL (ref 8.7–10.3)
Chloride: 103 mmol/L (ref 96–106)
Creatinine, Ser: 0.99 mg/dL (ref 0.57–1.00)
GFR calc Af Amer: 66 mL/min/{1.73_m2} (ref >59)
GFR calc non Af Amer: 57 mL/min/{1.73_m2} — ABNORMAL LOW (ref >59)
Glucose: 118 mg/dL — ABNORMAL HIGH (ref 65–99)
Potassium: 4.4 mmol/L (ref 3.5–5.2)
Sodium: 143 mmol/L (ref 134–144)

## 2019-10-13 ENCOUNTER — Ambulatory Visit: Payer: Medicare HMO | Admitting: Cardiology

## 2019-10-13 ENCOUNTER — Telehealth (HOSPITAL_COMMUNITY): Payer: Self-pay | Admitting: *Deleted

## 2019-10-13 NOTE — Telephone Encounter (Signed)

## 2019-10-14 ENCOUNTER — Other Ambulatory Visit: Payer: Self-pay

## 2019-10-14 ENCOUNTER — Ambulatory Visit (HOSPITAL_COMMUNITY)
Admission: RE | Admit: 2019-10-14 | Discharge: 2019-10-14 | Disposition: A | Discharge status: Home / Self Care | Payer: Medicare HMO | Source: Ambulatory Visit | Attending: Cardiology | Admitting: Cardiology

## 2019-10-14 DIAGNOSIS — R072 Precordial pain: Secondary | ICD-10-CM

## 2019-10-14 MED ORDER — NITROGLYCERIN 0.4 MG SL SUBL
SUBLINGUAL_TABLET | SUBLINGUAL | Status: AC
  Filled 2019-10-14: qty 2

## 2019-10-14 MED ORDER — NITROGLYCERIN 0.4 MG SL SUBL
0.8000 mg | SUBLINGUAL_TABLET | Freq: Once | SUBLINGUAL | Status: AC
  Administered 2019-10-14: 0.8 mg via SUBLINGUAL

## 2019-10-14 MED ORDER — IOHEXOL 350 MG/ML SOLN
80.0000 mL | Freq: Once | INTRAVENOUS | Status: AC | PRN
  Administered 2019-10-14: 80 mL via INTRAVENOUS

## 2019-10-14 NOTE — Progress Notes (Signed)
Dr [name] called ar [phone] and reported nothing significant seen on scan and may discharge patient.    Chest pain gone and feels fine. Discharged walking to go home by taxi.vS stable.

## 2019-10-14 NOTE — Progress Notes (Signed)
States has 4/10 burning central chest discomfort, the same as she has at home and the reason for this test. Color pink, skin warm and dry, no distress, VS stable. Dr [name] aware. Dr [name] will have a look at her CT heart scan and call me back and let me know if I can discharge patient.

## 2019-10-14 NOTE — Progress Notes (Signed)
When in CT scanner and body in scanner to abdomen only got hot with chest pressure. Heart rate increased from low 60's to 100 and wanted out of the scanner. History of claustrophobia and used to be medicated but has been under control for some time. Was taken out of scanner and started to settle. Dr [name] called and advised re above. Heart rate now in 60's to 70's. Unable to sedate as will take taxi home. Therefore if patient is willing to try, to scan retrospectively. Was able to tolerate scan and chest pressure resolved as well as hot feeling. On return to nurse's station, VS stable and having a drink of water.

## 2019-10-22 ENCOUNTER — Ambulatory Visit: Payer: Medicare HMO | Admitting: Podiatry

## 2019-10-22 ENCOUNTER — Other Ambulatory Visit: Payer: Self-pay | Admitting: Family

## 2019-10-22 DIAGNOSIS — E1142 Type 2 diabetes mellitus with diabetic polyneuropathy: Secondary | ICD-10-CM

## 2019-10-27 DIAGNOSIS — H43811 Vitreous degeneration, right eye: Secondary | ICD-10-CM | POA: Diagnosis not present

## 2019-10-27 DIAGNOSIS — E119 Type 2 diabetes mellitus without complications: Secondary | ICD-10-CM | POA: Diagnosis not present

## 2019-10-27 DIAGNOSIS — H16223 Keratoconjunctivitis sicca, not specified as Sjogren's, bilateral: Secondary | ICD-10-CM | POA: Diagnosis not present

## 2019-10-27 DIAGNOSIS — H25813 Combined forms of age-related cataract, bilateral: Secondary | ICD-10-CM | POA: Diagnosis not present

## 2019-10-27 DIAGNOSIS — H40013 Open angle with borderline findings, low risk, bilateral: Secondary | ICD-10-CM | POA: Diagnosis not present

## 2019-10-27 LAB — HM DIABETES EYE EXAM

## 2019-10-30 ENCOUNTER — Ambulatory Visit (INDEPENDENT_AMBULATORY_CARE_PROVIDER_SITE_OTHER): Payer: Medicare HMO

## 2019-10-30 ENCOUNTER — Ambulatory Visit: Payer: Medicare HMO | Admitting: Podiatry

## 2019-10-30 ENCOUNTER — Encounter: Payer: Self-pay | Admitting: Podiatry

## 2019-10-30 ENCOUNTER — Other Ambulatory Visit: Payer: Self-pay

## 2019-10-30 DIAGNOSIS — E1149 Type 2 diabetes mellitus with other diabetic neurological complication: Secondary | ICD-10-CM

## 2019-10-30 DIAGNOSIS — R6889 Other general symptoms and signs: Secondary | ICD-10-CM | POA: Diagnosis not present

## 2019-10-30 DIAGNOSIS — M79672 Pain in left foot: Secondary | ICD-10-CM

## 2019-10-30 DIAGNOSIS — E1140 Type 2 diabetes mellitus with diabetic neuropathy, unspecified: Secondary | ICD-10-CM

## 2019-10-30 DIAGNOSIS — M2062 Acquired deformities of toe(s), unspecified, left foot: Secondary | ICD-10-CM | POA: Diagnosis not present

## 2019-10-30 DIAGNOSIS — D169 Benign neoplasm of bone and articular cartilage, unspecified: Secondary | ICD-10-CM

## 2019-10-30 NOTE — Progress Notes (Signed)
Subjective:   Patient ID: Sierra Galvan V., female   DOB: 74 y.o.   MRN: [id number]   HPI Patient states she is dealing with some pain on the inside of her left big toe joint and also has long-term diabetes and she wanted a full foot exam.   ROS      Objective:  Physical Exam  Neurovascular status unchanged with slight diminishment sharp dull vibratory bilateral with good range of motion of the subtalar midtarsal joint with patient noted to have slight discomfort on the inside of the big toe joint left that is nonpainful currently but was painful before and patient is been using Epson salts     Assessment:  Appears stable from the standpoint of having had diabetes with inflammation left     Plan:  H&P x-ray reviewed diabetic education rendered and recommended continued Epson salt soaks and if these were to get worse on the left foot will consider other treatment  X-rays indicate that there is no signs of fracture or other pathology associated with the discomfort she is experiencing

## 2019-11-03 ENCOUNTER — Other Ambulatory Visit: Payer: Self-pay | Admitting: Podiatry

## 2019-11-03 DIAGNOSIS — M2062 Acquired deformities of toe(s), unspecified, left foot: Secondary | ICD-10-CM

## 2019-11-05 ENCOUNTER — Ambulatory Visit: Payer: Medicare HMO

## 2019-11-06 ENCOUNTER — Other Ambulatory Visit: Payer: Self-pay

## 2019-11-06 ENCOUNTER — Ambulatory Visit
Admission: RE | Admit: 2019-11-06 | Discharge: 2019-11-06 | Disposition: A | Discharge status: Home / Self Care | Payer: Medicare HMO | Source: Ambulatory Visit | Attending: Family | Admitting: Family

## 2019-11-06 DIAGNOSIS — Z1231 Encounter for screening mammogram for malignant neoplasm of breast: Secondary | ICD-10-CM | POA: Diagnosis not present

## 2019-11-25 DIAGNOSIS — H16223 Keratoconjunctivitis sicca, not specified as Sjogren's, bilateral: Secondary | ICD-10-CM | POA: Diagnosis not present

## 2019-11-25 DIAGNOSIS — H401132 Primary open-angle glaucoma, bilateral, moderate stage: Secondary | ICD-10-CM | POA: Diagnosis not present

## 2019-11-25 DIAGNOSIS — H25813 Combined forms of age-related cataract, bilateral: Secondary | ICD-10-CM | POA: Diagnosis not present

## 2019-11-25 DIAGNOSIS — H43811 Vitreous degeneration, right eye: Secondary | ICD-10-CM | POA: Diagnosis not present

## 2019-11-25 DIAGNOSIS — E119 Type 2 diabetes mellitus without complications: Secondary | ICD-10-CM | POA: Diagnosis not present

## 2019-12-10 ENCOUNTER — Other Ambulatory Visit: Payer: Self-pay | Admitting: *Deleted

## 2019-12-10 MED ORDER — ACCU-CHEK SOFTCLIX LANCET DEV KIT: PACK | 0 refills | Status: DC

## 2019-12-10 NOTE — Telephone Encounter (Signed)
Patient requested Rx.

## 2019-12-15 ENCOUNTER — Telehealth: Payer: Self-pay | Admitting: Podiatry

## 2019-12-15 NOTE — Telephone Encounter (Signed)
Patient called stating she received a bill for xrays from 10/30/2019 visit. States she called insurance company and they told her she needs pre-authorization for [employer].

## 2019-12-16 NOTE — Telephone Encounter (Signed)
Elmyra Ricks, I don't have Vicki's address in here. Would you mind asking her about this and whether this is misunderstanding on patients part. Thanks, N

## 2019-12-25 ENCOUNTER — Ambulatory Visit (INDEPENDENT_AMBULATORY_CARE_PROVIDER_SITE_OTHER): Payer: Medicare HMO | Admitting: Family

## 2019-12-25 ENCOUNTER — Telehealth: Payer: Self-pay

## 2019-12-25 ENCOUNTER — Encounter: Payer: Self-pay | Admitting: Family

## 2019-12-25 ENCOUNTER — Other Ambulatory Visit: Payer: Self-pay

## 2019-12-25 DIAGNOSIS — Z0000 Encounter for general adult medical examination without abnormal findings: Secondary | ICD-10-CM | POA: Diagnosis not present

## 2019-12-25 DIAGNOSIS — Z780 Asymptomatic menopausal state: Secondary | ICD-10-CM | POA: Diagnosis not present

## 2019-12-25 NOTE — Telephone Encounter (Signed)
Sierra Ray, Sierra Ray are scheduled for a virtual visit with your provider today.    Just as we do with appointments in the office, we must obtain your consent to participate.  Your consent will be active for this visit and any virtual visit you may have with one of our providers in the next 365 days.    If you have a MyChart account, I can also send a copy of this consent to you electronically.  All virtual visits are billed to your insurance company just like a traditional visit in the office.  As this is a virtual visit, video technology does not allow for your provider to perform a traditional examination.  This may limit your provider's ability to fully assess your condition.  If your provider identifies any concerns that need to be evaluated in person or the need to arrange testing such as labs, EKG, etc, we will make arrangements to do so.    Although advances in technology are sophisticated, we cannot ensure that it will always work on either your end or our end.  If the connection with a video visit is poor, we may have to switch to a telephone visit.  With either a video or telephone visit, we are not always able to ensure that we have a secure connection.   I need to obtain your verbal consent now.   Are you willing to proceed with your visit today?   Sierra Hendricksen V. has provided verbal consent on 12/25/2019 for a virtual visit (video or telephone).   Sierra Ray, [place] 12/25/2019  8:30 AM

## 2019-12-25 NOTE — Patient Instructions (Signed)
Sierra Ray , Thank you for taking time to come for your Medicare Wellness Visit. I appreciate your ongoing commitment to your health goals. Please review the following plan we discussed and let me know if I can assist you in the future.   Screening recommendations/referrals: Colonoscopy: Up to date  Mammogram : Up to date Bone Density: Ordered today  Recommended yearly ophthalmology/optometry visit for glaucoma screening and checkup Recommended yearly dental visit for hygiene and checkup  Vaccinations: Influenza vaccine : plan to get flu shot in oct,2021  Pneumococcal vaccine : Up to date Tdap vaccine : Up to date Shingles vaccine : Declined     Advanced directives: Yes   Conditions/risks identified: *Advance age female > 72 yrs,type 2 DM,dyslipidemia,Hx of premature cardiovascular disease,Hypertension   Next appointment: 1 year    Preventive Care 72 Years and Older, Female Preventive care refers to lifestyle choices and visits with your health care provider that can promote health and wellness. What does preventive care include?  A yearly physical exam. This is also called an annual well check.  Dental exams once or twice a year.  Routine eye exams. Ask your health care provider how often you should have your eyes checked.  Personal lifestyle choices, including:  Daily care of your teeth and gums.  Regular physical activity.  Eating a healthy diet.  Avoiding tobacco and drug use.  Limiting alcohol use.  Practicing safe sex.  Taking low-dose aspirin every day.  Taking vitamin and mineral supplements as recommended by your health care provider. What happens during an annual well check? The services and screenings done by your health care provider during your annual well check will depend on your age, overall health, lifestyle risk factors, and family history of disease. Counseling  Your health care provider may ask you questions about your:  Alcohol use.   Tobacco use.  Drug use.  Emotional well-being.  Home and relationship well-being.  Sexual activity.  Eating habits.  History of falls.  Memory and ability to understand (cognition).  Work and work Statistician.  Reproductive health. Screening  You may have the following tests or measurements:  Height, weight, and BMI.  Blood pressure.  Lipid and cholesterol levels. These may be checked every 5 years, or more frequently if you are over 37 years old.  Skin check.  Lung cancer screening. You may have this screening every year starting at age 72 if you have a 30-pack-year history of smoking and currently smoke or have quit within the past 15 years.  Fecal occult blood test (FOBT) of the stool. You may have this test every year starting at age 72.  Flexible sigmoidoscopy or colonoscopy. You may have a sigmoidoscopy every 5 years or a colonoscopy every 10 years starting at age 72.  Hepatitis C blood test.  Hepatitis B blood test.  Sexually transmitted disease (STD) testing.  Diabetes screening. This is done by checking your blood sugar (glucose) after you have not eaten for a while (fasting). You may have this done every 1-3 years.  Bone density scan. This is done to screen for osteoporosis. You may have this done starting at age 72.  Mammogram. This may be done every 1-2 years. Talk to your health care provider about how often you should have regular mammograms. Talk with your health care provider about your test results, treatment options, and if necessary, the need for more tests. Vaccines  Your health care provider may recommend certain vaccines, such as:  Influenza vaccine. This  is recommended every year.  Tetanus, diphtheria, and acellular pertussis (Tdap, Td) vaccine. You may need a Td booster every 10 years.  Zoster vaccine. You may need this after age 72.  Pneumococcal 13-valent conjugate (PCV13) vaccine. One dose is recommended after age 72.  Pneumococcal  polysaccharide (PPSV23) vaccine. One dose is recommended after age 72. Talk to your health care provider about which screenings and vaccines you need and how often you need them. This information is not intended to replace advice given to you by your health care provider. Make sure you discuss any questions you have with your health care provider. Document Released: 04/29/2015 Document Revised: 12/21/2015 Document Reviewed: 02/01/2015 Elsevier Interactive Patient Education  2017 [employer] Prevention in the Home Falls can cause injuries. They can happen to people of all ages. There are many things you can do to make your home safe and to help prevent falls. What can I do on the outside of my home?  Regularly fix the edges of walkways and driveways and fix any cracks.  Remove anything that might make you trip as you walk through a door, such as a raised step or threshold.  Trim any bushes or trees on the path to your home.  Use bright outdoor lighting.  Clear any walking paths of anything that might make someone trip, such as rocks or tools.  Regularly check to see if handrails are loose or broken. Make sure that both sides of any steps have handrails.  Any raised decks and porches should have guardrails on the edges.  Have any leaves, snow, or ice cleared regularly.  Use sand or salt on walking paths during winter.  Clean up any spills in your garage right away. This includes oil or grease spills. What can I do in the bathroom?  Use night lights.  Install grab bars by the toilet and in the tub and shower. Do not use towel bars as grab bars.  Use non-skid mats or decals in the tub or shower.  If you need to sit down in the shower, use a plastic, non-slip stool.  Keep the floor dry. Clean up any water that spills on the floor as soon as it happens.  Remove soap buildup in the tub or shower regularly.  Attach bath mats securely with double-sided non-slip rug tape.   Do not have throw rugs and other things on the floor that can make you trip. What can I do in the bedroom?  Use night lights.  Make sure that you have a light by your bed that is easy to reach.  Do not use any sheets or blankets that are too big for your bed. They should not hang down onto the floor.  Have a firm chair that has side arms. You can use this for support while you get dressed.  Do not have throw rugs and other things on the floor that can make you trip. What can I do in the kitchen?  Clean up any spills right away.  Avoid walking on wet floors.  Keep items that you use a lot in easy-to-reach places.  If you need to reach something above you, use a strong step stool that has a grab bar.  Keep electrical cords out of the way.  Do not use floor polish or wax that makes floors slippery. If you must use wax, use non-skid floor wax.  Do not have throw rugs and other things on the floor that can make you  trip. What can I do with my stairs?  Do not leave any items on the stairs.  Make sure that there are handrails on both sides of the stairs and use them. Fix handrails that are broken or loose. Make sure that handrails are as long as the stairways.  Check any carpeting to make sure that it is firmly attached to the stairs. Fix any carpet that is loose or worn.  Avoid having throw rugs at the top or bottom of the stairs. If you do have throw rugs, attach them to the floor with carpet tape.  Make sure that you have a light switch at the top of the stairs and the bottom of the stairs. If you do not have them, ask someone to add them for you. What else can I do to help prevent falls?  Wear shoes that:  Do not have high heels.  Have rubber bottoms.  Are comfortable and fit you well.  Are closed at the toe. Do not wear sandals.  If you use a stepladder:  Make sure that it is fully opened. Do not climb a closed stepladder.  Make sure that both sides of the  stepladder are locked into place.  Ask someone to hold it for you, if possible.  Clearly mark and make sure that you can see:  Any grab bars or handrails.  First and last steps.  Where the edge of each step is.  Use tools that help you move around (mobility aids) if they are needed. These include:  Canes.  Walkers.  Scooters.  Crutches.  Turn on the lights when you go into a dark area. Replace any light bulbs as soon as they burn out.  Set up your furniture so you have a clear path. Avoid moving your furniture around.  If any of your floors are uneven, fix them.  If there are any pets around you, be aware of where they are.  Review your medicines with your doctor. Some medicines can make you feel dizzy. This can increase your chance of falling. Ask your doctor what other things that you can do to help prevent falls. This information is not intended to replace advice given to you by your health care provider. Make sure you discuss any questions you have with your health care provider. Document Released: 01/27/2009 Document Revised: 09/08/2015 Document Reviewed: 05/07/2014 Elsevier Interactive Patient Education  2017 [employer].

## 2019-12-25 NOTE — Progress Notes (Signed)
Subjective:   Sierra Kneale V. is a 74 y.o. female who presents for Medicare Annual (Subsequent) preventive examination.  Review of Systems     Cardiac Risk Factors include: advanced age (>26mn, >>63women);diabetes mellitus;dyslipidemia;family history of premature cardiovascular disease;hypertension     Objective:    Today's Vitals   12/25/19 0822  PainSc: 8    There is no height or weight on file to calculate BMI.  Advanced Directives 12/25/2019 08/12/2019 02/11/2019 07/24/2017 07/24/2017 08/20/2016 07/09/2016  Does Patient Have a Medical Advance Directive? Yes Yes Yes Yes Yes Yes Yes  Type of Advance Directive Living will;Healthcare Power of Attorney Living will [place] Living will Living will Living will;Healthcare Power of AFort [place]  Does patient want to make changes to medical advance directive? No - Patient declined No - Patient declined No - Patient declined No - Patient declined No - Patient declined - -  Copy of HOsceolain Chart? Yes - validated most recent copy scanned in chart (See row information) - Yes - validated most recent copy scanned in chart (See row information) - - No - copy requested -    Current Medications (verified) Outpatient Encounter Medications as of 12/25/2019  Medication Sig  . Accu-Chek Softclix Lancets lancets USE  TO TEST BLOOD SUGAR ONE TIME DAILY FOR DIABETES  . amLODipine (NORVASC) 5 MG tablet Take 1 tablet daily  . Blood Glucose Monitoring Suppl (ACCU-CHEK AVIVA PLUS) w/Device KIT Use to test blood sugar twice daily. Dx E11.42  . cholecalciferol (VITAMIN D) 1000 UNITS tablet Take 1,000 Units by mouth daily.   . fluticasone (FLONASE) 50 MCG/ACT nasal spray Place 2 sprays into both nostrils as needed.   .Marland Kitchenglucose blood (ACCU-CHEK AVIVA PLUS) test strip USE TO TEST BLOOD SUGAR TWICE DAILY  . Lancets Misc. (ACCU-CHEK SOFTCLIX LANCET DEV) KIT Use to test blood sugar. Dx: E11.42  .  loratadine (CLARITIN) 10 MG tablet Take 10 mg by mouth daily.  . metFORMIN (GLUCOPHAGE) 500 MG tablet TAKE 2 TABLETS (1,000 MG TOTAL) BY MOUTH DAILY WITH BREAKFAST.  . Multiple Vitamins-Minerals (MULTIVITAMIN WITH MINERALS) tablet Take 1 tablet by mouth daily.  . multivitamin-lutein (OCUVITE-LUTEIN) CAPS capsule Take 1 capsule by mouth daily.  . Omega-3 Fatty Acids (FISH OIL) 1000 MG CAPS Take 2 capsules by mouth 2 (two) times a week.   . polyethylene glycol (MIRALAX / GLYCOLAX) packet Take 17 g by mouth daily as needed. For constipation  . Probiotic Product (ALIGN) 4 MG CAPS Take 1 capsule by mouth daily.   .Marland KitchenPropylene Glycol (SYSTANE COMPLETE OP) Place 1 drop into both eyes in the morning, at noon, in the evening, and at bedtime.  . rosuvastatin (CRESTOR) 5 MG tablet Take 1 tablet (5 mg total) by mouth daily.  .Marland Kitchensenna-docusate (SENOKOT-S) 8.6-50 MG tablet Take 1 tablet by mouth at bedtime.  . traZODone (DESYREL) 50 MG tablet Take 0.5-1 tablets (25-50 mg total) by mouth at bedtime as needed for sleep.  . [DISCONTINUED] metoprolol tartrate (LOPRESSOR) 25 MG tablet Take 0.5 tablets (12.5 mg total) by mouth 2 (two) times daily.  . [DISCONTINUED] omeprazole (PRILOSEC) 40 MG capsule Take 40 mg by mouth daily.  . [DISCONTINUED] OVER THE COUNTER MEDICATION Place 1 drop into both eyes 2 (two) times daily as needed. For dry eyes. Equate Brand Restore Tears Lubricant Eye Drops    No facility-administered encounter medications on file as of 12/25/2019.    Allergies (verified) Aspirin and Pravastatin sodium  History: Past Medical History:  Diagnosis Date  . Abnormal Pap smear 05/08/2011   ASC-US +HPV  . Arthritis   . Asthma   . Constipation   . Diabetes mellitus type 2 with neurological manifestations ([place])   . Diabetic peripheral neuropathy ([place])   . Generalized osteoarthritis   . GERD (gastroesophageal reflux disease)   . Hx of seasonal allergies   . Hyperlipidemia   . Hypertension   . IBS  (irritable bowel syndrome)   . S/P total hysterectomy 06/21/2011   Past Surgical History:  Procedure Laterality Date  . EYE SURGERY Right 01/2018   Removed raised area on eye. Dr.Groat   . OVARIAN CYST SURGERY  1986  . PELVIC LAPAROSCOPY    . TONSILECTOMY, ADENOIDECTOMY, BILATERAL MYRINGOTOMY AND TUBES  1974  . VAGINAL HYSTERECTOMY  1983   Family History  Problem Relation Age of Onset  . Diabetes Father   . Heart disease Father   . Diabetes Mother   . Cancer Mother        colon   . Pancreatic cancer Brother   . Pancreatic cancer Sister   . Cancer Paternal Grandmother        liver   . Breast cancer Cousin   . Breast cancer Maternal Aunt    Social History   Socioeconomic History  . Marital status: Single    Spouse name: Not on file  . Number of children: 4  . Years of education: Not on file  . Highest education level: Not on file  Occupational History    Employer: RETIRED  Tobacco Use  . Smoking status: Never Smoker  . Smokeless tobacco: Never Used  Vaping Use  . Vaping Use: Never used  Substance and Sexual Activity  . Alcohol use: No  . Drug use: No  . Sexual activity: Not Currently    Birth control/protection: Surgical    Comment: 1st intercourse- 20, partners- 8,   Other Topics Concern  . Not on file  Social History Narrative   Diet?    Low fat, low carb, low salt      Do you drink/eat things with caffeine?      Marital status?  single                                  What year were you married?      Do you live in a house, apartment, assisted living, condo, trailer, etc.?      Is it one or more stories?      How many persons live in your home?      Do you have any pets in your home? (please list)      Current or past profession:      Do you exercise?   yes                 Type & how often? Yoga, water aerobics 1-2x/week      Do you have a living will?      Do you have a DNR form?                                  If not, do you want to discuss one?       Do you have signed POA/HPOA for forms?       Social Determinants of Health  Financial Resource Strain:   . Difficulty of Paying Living Expenses: Not on file  Food Insecurity:   . Worried About Charity fundraiser in the Last Year: Not on file  . Ran Out of Food in the Last Year: Not on file  Transportation Needs:   . Lack of Transportation (Medical): Not on file  . Lack of Transportation (Non-Medical): Not on file  Physical Activity:   . Days of Exercise per Week: Not on file  . Minutes of Exercise per Session: Not on file  Stress:   . Feeling of Stress : Not on file  Social Connections:   . Frequency of Communication with Friends and Family: Not on file  . Frequency of Social Gatherings with Friends and Family: Not on file  . Attends Religious Services: Not on file  . Active Member of Clubs or Organizations: Not on file  . Attends Archivist Meetings: Not on file  . Marital Status: Not on file    Tobacco Counseling Counseling given: Not Answered   Clinical Intake:  Pre-visit preparation completed: No  Pain : 0-10 Pain Score: 8  Pain Type: Chronic pain Pain Location: Back (knees) Pain Orientation: Lower Pain Radiating Towards: no Pain Descriptors / Indicators: Aching Pain Onset: Other (comment) (20+ yrs) Pain Frequency: Intermittent Pain Relieving Factors: rest,Tylenol,heating pad,Epsom salt Effect of Pain on Daily Activities: standing  Pain Relieving Factors: rest,Tylenol,heating pad,Epsom salt  BMI - recorded: 25.16 Nutritional Status: BMI 25 -29 Overweight Nutritional Risks: None Diabetes: Yes CBG done?: Yes (114) CBG resulted in Enter/ Edit results?: No Did pt. bring in CBG monitor from home?: No  How often do you need to have someone help you when you read instructions, pamphlets, or other written materials from your doctor or pharmacy?: 3 - Sometimes What is the last grade level you completed in school?: 2 yrs college  Diabetic?yes   Interpreter Needed?: No  Information entered by :: [place] FNP-C   Activities of Daily Living In your present state of health, do you have any difficulty performing the following activities: 12/25/2019  Hearing? N  Vision? Y  Comment scheduled for cataract surgery with Dr.Groat  Difficulty concentrating or making decisions? Y  Comment Remembering  Walking or climbing stairs? Y  Comment knee pain  Dressing or bathing? N  Doing errands, shopping? Y  Comment [place] and eating ? 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 Y  Comment constipation  In the past six months, have you accidently leaked urine? N  Do you have problems with loss of bowel control? N  Managing your Medications? N  Managing your Finances? N  Housekeeping or managing your Housekeeping? N  Some recent data might be hidden    Patient Care Team: Sher Hellinger, [name], NP as PCP - General (Family Medicine) Dorothy Spark, MD as Consulting Physician (Cardiology) Clent Jacks, MD as Consulting Physician (Ophthalmology)  Indicate any recent Medical Services you may have received from other than [facility] providers in the past year (date may be approximate).     Assessment:   This is a routine wellness examination for Genny.  Hearing/Vision screen  Hearing Screening   '125Hz'  '250Hz'  '500Hz'  '1000Hz'  '2000Hz'  '3000Hz'  '4000Hz'  '6000Hz'  '8000Hz'   Right ear:           Left ear:           Comments: No Hearing Concerns.   Vision Screening Comments: No Vision Concerns. Patient wears prescription glasses.  Dietary issues and exercise activities discussed: Current Exercise Habits: Home exercise routine, Type of exercise: walking, Time (Minutes): 60, Frequency (Times/Week): 3, Weekly Exercise (Minutes/Week): 180, Intensity: Moderate  Goals    . Exercise 3x per week (30 min per time)     Starting 07/03/16 I will start doing exercise three days a week.I would like to walk a total of 30  min three times a week and eventually get to aquatics.    . Patient Stated     Patient will like to get back into aquatics with her friends    . Patient Stated     Be able sleep 6-8 hrs       Depression Screen PHQ 2/9 Scores 12/25/2019 02/11/2019 07/24/2017 07/24/2017 01/23/2017 07/09/2016 07/03/2016  PHQ - 2 Score 0 0 0 0 0 4 3  PHQ- 9 Score - - - - - 15 10    Fall Risk Fall Risk  12/25/2019 08/12/2019 03/04/2019 02/11/2019 01/28/2018  Falls in the past year? 0 0 0 0 No  Comment - Feels off balance most of the time, but no falls - - -  Number falls in past yr: 0 - 0 - -  Injury with Fall? 0 - 0 - -    Any stairs in or around the home? yes If so, are there any without handrails? Yes  Home free of loose throw rugs in walkways, pet beds, electrical cords, etc? No  Adequate lighting in your home to reduce risk of falls? Yes   ASSISTIVE DEVICES UTILIZED TO PREVENT FALLS:  Life alert? No  Use of a cane, walker or w/c? Yes  Grab bars in the bathroom? Yes  Shower chair or bench in shower? Yes  Elevated toilet seat or a handicapped toilet? No   TIMED UP AND GO:  Was the test performed? No .  Length of time to ambulate 10 feet: N/A  sec.   Gait steady and fast with assistive device  Cognitive Function: MMSE - Mini Mental State Exam 07/24/2017 07/03/2016 07/01/2015  Not completed: - - (No Data)  Orientation to time '5 5 5  ' Orientation to Place '5 5 5  ' Registration '3 3 3  ' Attention/ Calculation '5 5 5  ' Recall '1 2 3  ' Language- name 2 objects '2 2 2  ' Language- repeat '1 1 1  ' Language- follow 3 step command '3 3 3  ' Language- read & follow direction '1 1 1  ' Write a sentence '1 1 1  ' Copy design '1 1 1  ' Total score '28 29 30     ' 6CIT Screen 12/25/2019  What Year? 0 points  What month? 0 points  What time? 0 points  Count back from 20 0 points  Months in reverse 0 points  Repeat phrase 0 points  Total Score 0    Immunizations Immunization History  Administered Date(s) Administered   . Fluad Quad(high Dose 65+) 02/11/2019  . Influenza, High Dose Seasonal PF 01/28/2018  . Influenza,inj,Quad PF,6+ Mos 02/03/2016, 01/23/2017  . Moderna SARS-COVID-2 Vaccination 06/22/2019, 07/24/2019  . Pneumococcal Conjugate-13 07/30/2014  . Pneumococcal Polysaccharide-23 11/17/2005, 05/20/2012  . Td 08/18/2010    TDAP status: Up to date Flu Vaccine status: Up to date Pneumococcal vaccine status: Up to date Covid-19 vaccine status: Completed vaccines  Qualifies for Shingles Vaccine? Yes   Zostavax completed No   Shingrix Completed?: No.    Education has been provided regarding the importance of this vaccine. Patient has been advised to call insurance company to determine  out of pocket expense if they have not yet received this vaccine. Advised may also receive vaccine at local pharmacy or Health Dept. Verbalized acceptance and understanding.  Screening Tests Health Maintenance  Topic Date Due  . Hepatitis C Screening  Never done  . FOOT EXAM  01/23/2018  . INFLUENZA VACCINE  11/15/2019  . HEMOGLOBIN A1C  02/06/2020  . URINE MICROALBUMIN  08/11/2020  . TETANUS/TDAP  08/17/2020  . OPHTHALMOLOGY EXAM  10/26/2020  . MAMMOGRAM  11/05/2021  . COLONOSCOPY  09/05/2025  . DEXA SCAN  Completed  . COVID-19 Vaccine  Completed  . PNA vac Low Risk Adult  Completed    Health Maintenance  Health Maintenance Due  Topic Date Due  . Hepatitis C Screening  Never done  . FOOT EXAM  01/23/2018  . INFLUENZA VACCINE  11/15/2019    Colorectal cancer screening: Completed 09/06/2015. Repeat every 5 years Mammogram status: Completed 11/06/2019. Repeat every year Bone Density status: Completed 02/02/2015. Results reflect: Bone density results: OSTEOPENIA. Repeat every 2 years.  Lung Cancer Screening: (Low Dose CT Chest recommended if Age 69-80 years, 30 pack-year currently smoking OR have quit w/in 15years.) does not qualify.   Lung Cancer Screening Referral: no  Additional Screening:   Hepatitis C Screening: does not qualify; Completed Yes   Vision Screening: Recommended annual ophthalmology exams for early detection of glaucoma and other disorders of the eye. Is the patient up to date with their annual eye exam?  Yes  Who is the provider or what is the name of the office in which the patient attends annual eye exams? Dr.Groat  If pt is not established with a provider, would they like to be referred to a provider to establish care? No .   Dental Screening: Recommended annual dental exams for proper oral hygiene  Community Resource Referral / Chronic Care Management: CRR required this visit?  No   CCM required this visit?  No      Plan:    - Declined shingrix vaccine  - Dexa Scan   I have personally reviewed and noted the following in the patient's chart:   . Medical and social history . Use of alcohol, tobacco or illicit drugs  . Current medications and supplements . Functional ability and status . Nutritional status . Physical activity . Advanced directives . List of other physicians . Hospitalizations, surgeries, and ER visits in previous 12 months . Vitals . Screenings to include cognitive, depression, and falls . Referrals and appointments  In addition, I have reviewed and discussed with patient certain preventive protocols, quality metrics, and best practice recommendations. A written personalized care plan for preventive services as well as general preventive health recommendations were provided to patient.     [name], NP   12/25/2019   Nurse Notes: Dexa Scan

## 2019-12-25 NOTE — Progress Notes (Signed)
    This service is provided via telemedicine  No vital signs collected/recorded due to the encounter was a telemedicine visit.   Location of patient (ex: home, work): Home.  Patient consents to a telephone visit: Yes.  Location of the provider (ex: office, home):  [facility].  Name of any referring provider: N/A  Names of all persons participating in the telemedicine service and their role in the encounter:  Patient, Tyshika Baldridge, RMA, [name], [name], NP.    Time spent on call: 8 minutes spent on the phone with Medical Assistant.   

## 2019-12-31 DIAGNOSIS — H2512 Age-related nuclear cataract, left eye: Secondary | ICD-10-CM | POA: Diagnosis not present

## 2020-01-05 ENCOUNTER — Encounter: Payer: Medicare HMO | Admitting: Obstetrics & Gynecology

## 2020-01-12 DIAGNOSIS — R6889 Other general symptoms and signs: Secondary | ICD-10-CM | POA: Diagnosis not present

## 2020-02-08 ENCOUNTER — Other Ambulatory Visit: Payer: Self-pay

## 2020-02-08 ENCOUNTER — Other Ambulatory Visit: Payer: Medicare HMO

## 2020-02-08 DIAGNOSIS — Z1159 Encounter for screening for other viral diseases: Secondary | ICD-10-CM

## 2020-02-08 DIAGNOSIS — E785 Hyperlipidemia, unspecified: Secondary | ICD-10-CM | POA: Diagnosis not present

## 2020-02-08 DIAGNOSIS — E1142 Type 2 diabetes mellitus with diabetic polyneuropathy: Secondary | ICD-10-CM

## 2020-02-08 DIAGNOSIS — I152 Hypertension secondary to endocrine disorders: Secondary | ICD-10-CM | POA: Diagnosis not present

## 2020-02-08 DIAGNOSIS — E1169 Type 2 diabetes mellitus with other specified complication: Secondary | ICD-10-CM | POA: Diagnosis not present

## 2020-02-08 DIAGNOSIS — E1159 Type 2 diabetes mellitus with other circulatory complications: Secondary | ICD-10-CM | POA: Diagnosis not present

## 2020-02-09 LAB — CBC WITH DIFFERENTIAL/PLATELET
Absolute Monocytes: 615 cells/uL (ref 200–950)
Basophils Absolute: 58 cells/uL (ref 0–200)
Basophils Relative: 1.1 %
Eosinophils Absolute: 514 cells/uL — ABNORMAL HIGH (ref 15–500)
Eosinophils Relative: 9.7 %
HCT: 43 % (ref 35.0–45.0)
Hemoglobin: 14.3 g/dL (ref 11.7–15.5)
Lymphs Abs: 2427 cells/uL (ref 850–3900)
MCH: 28.9 pg (ref 27.0–33.0)
MCHC: 33.3 g/dL (ref 32.0–36.0)
MCV: 86.9 fL (ref 80.0–100.0)
MPV: 10.2 fL (ref 7.5–12.5)
Monocytes Relative: 11.6 %
Neutro Abs: 1685 cells/uL (ref 1500–7800)
Neutrophils Relative %: 31.8 %
Platelets: 185 10*3/uL (ref 140–400)
RBC: 4.95 10*6/uL (ref 3.80–5.10)
RDW: 13 % (ref 11.0–15.0)
Total Lymphocyte: 45.8 %
WBC: 5.3 10*3/uL (ref 3.8–10.8)

## 2020-02-09 LAB — COMPLETE METABOLIC PANEL WITH GFR
AG Ratio: 1.4 (calc) (ref 1.0–2.5)
ALT: 15 U/L (ref 6–29)
AST: 19 U/L (ref 10–35)
Albumin: 4.2 g/dL (ref 3.6–5.1)
Alkaline phosphatase (APISO): 56 U/L (ref 37–153)
BUN/Creatinine Ratio: 19 (calc) (ref 6–22)
BUN: 19 mg/dL (ref 7–25)
CO2: 28 mmol/L (ref 20–32)
Calcium: 9.7 mg/dL (ref 8.6–10.4)
Chloride: 105 mmol/L (ref 98–110)
Creat: 0.98 mg/dL — ABNORMAL HIGH (ref 0.60–0.93)
GFR, Est African American: 67 mL/min/{1.73_m2} (ref >=60)
GFR, Est Non African American: 58 mL/min/{1.73_m2} — ABNORMAL LOW (ref >=60)
Globulin: 3 g/dL (calc) (ref 1.9–3.7)
Glucose, Bld: 132 mg/dL — ABNORMAL HIGH (ref 65–99)
Potassium: 4.4 mmol/L (ref 3.5–5.3)
Sodium: 142 mmol/L (ref 135–146)
Total Bilirubin: 0.5 mg/dL (ref 0.2–1.2)
Total Protein: 7.2 g/dL (ref 6.1–8.1)

## 2020-02-09 LAB — LIPID PANEL
Cholesterol: 176 mg/dL (ref <200)
HDL: 67 mg/dL (ref >=50)
LDL Cholesterol (Calc): 83 mg/dL (calc)
Non-HDL Cholesterol (Calc): 109 mg/dL (calc) (ref <130)
Total CHOL/HDL Ratio: 2.6 (calc) (ref <5.0)
Triglycerides: 162 mg/dL — ABNORMAL HIGH (ref <150)

## 2020-02-09 LAB — HEMOGLOBIN A1C
Hgb A1c MFr Bld: 6.7 % of total Hgb — ABNORMAL HIGH (ref <5.7)
Mean Plasma Glucose: 146 (calc)
eAG (mmol/L): 8.1 (calc)

## 2020-02-09 LAB — HEPATITIS C ANTIBODY
Hepatitis C Ab: NONREACTIVE
SIGNAL TO CUT-OFF: 0.02 (ref <1.00)

## 2020-02-09 LAB — TSH: TSH: 2.1 mIU/L (ref 0.40–4.50)

## 2020-02-12 ENCOUNTER — Other Ambulatory Visit: Payer: Self-pay

## 2020-02-12 ENCOUNTER — Encounter: Payer: Self-pay | Admitting: Family

## 2020-02-12 ENCOUNTER — Ambulatory Visit (INDEPENDENT_AMBULATORY_CARE_PROVIDER_SITE_OTHER): Payer: Medicare HMO | Admitting: Family

## 2020-02-12 VITALS — BP 98/58 | HR 89 | Temp 98.2°F | Resp 16 | Ht 66.0 in | Wt 149.2 lb

## 2020-02-12 DIAGNOSIS — G4700 Insomnia, unspecified: Secondary | ICD-10-CM | POA: Diagnosis not present

## 2020-02-12 DIAGNOSIS — I152 Hypertension secondary to endocrine disorders: Secondary | ICD-10-CM

## 2020-02-12 DIAGNOSIS — E785 Hyperlipidemia, unspecified: Secondary | ICD-10-CM

## 2020-02-12 DIAGNOSIS — Z23 Encounter for immunization: Secondary | ICD-10-CM

## 2020-02-12 DIAGNOSIS — R2681 Unsteadiness on feet: Secondary | ICD-10-CM

## 2020-02-12 DIAGNOSIS — E1169 Type 2 diabetes mellitus with other specified complication: Secondary | ICD-10-CM

## 2020-02-12 DIAGNOSIS — E1159 Type 2 diabetes mellitus with other circulatory complications: Secondary | ICD-10-CM

## 2020-02-12 DIAGNOSIS — K219 Gastro-esophageal reflux disease without esophagitis: Secondary | ICD-10-CM

## 2020-02-12 DIAGNOSIS — E1142 Type 2 diabetes mellitus with diabetic polyneuropathy: Secondary | ICD-10-CM

## 2020-02-12 DIAGNOSIS — J302 Other seasonal allergic rhinitis: Secondary | ICD-10-CM | POA: Diagnosis not present

## 2020-02-12 NOTE — Progress Notes (Addendum)
Provider: [name]   [place], [name], NP  Patient Care Team: Alexi Dorminey, [name], NP as PCP - General (Family Medicine) Dorothy Spark, MD as Consulting Physician (Cardiology) Clent Jacks, MD as Consulting Physician (Ophthalmology)  Extended Emergency Contact Information Primary Emergency Contact: [name],Mark Address: [place], [place] Phone: [phone] Relation: Son  Code Status: Full Code  Goals of care: Advanced Directive information Advanced Directives 02/12/2020  Does Patient Have a Medical Advance Directive? Yes  Type of Advance Directive Living will;Healthcare Power of Attorney  Does patient want to make changes to medical advance directive? No - Patient declined  Copy of [place] in Chart? Yes - validated most recent copy scanned in chart (See row information)     Chief Complaint  Patient presents with  . Medical Management of Chronic Issues    6 Month Follow Up.  Marland Kitchen Health Maintenance    Discuss the need for Foot Exam.  . Immunizations    Dicsuss the need for Influenza Vaccine.     HPI:  Pt is a 74 y.o. female seen today for 6 months follow up for medical management of chronic diseases. She states Trazodone has helped with her insomnia. sleeping well at night. Type 2 DM - on metformin 1000 mg tablet daily.No CBG log for review.she dneie any signs of hypo/hyperglycemia. Recent Hgb A1C was 6.7 slight improvement from 6 months ago 6.9   Right Shoulder pain - takes extra strength Tylenol 500 mg tablet every 6 hrs as needed. Has also used Voltaren and salon pas.Pain  Seasonal allergies - states OTC Claritin daily helps. Also uses Flonase nasal spray.  Hyperlipidemia - on rosuvastatin 5 mg tablet daily. Recent chol and LD are within normal range.TRG still slightly high but has improved compared to 6 months ago.States trying to eat a healthy diet.  She is due for her annual influenza  vaccine today. She denies any acute illness, fever,achness or chills.     Past Medical History:  Diagnosis Date  . Abnormal Pap smear 05/08/2011   ASC-US +HPV  . Arthritis   . Asthma   . Constipation   . Diabetes mellitus type 2 with neurological manifestations ([place])   . Diabetic peripheral neuropathy ([place])   . Generalized osteoarthritis   . GERD (gastroesophageal reflux disease)   . Hx of seasonal allergies   . Hyperlipidemia   . Hypertension   . IBS (irritable bowel syndrome)   . S/P total hysterectomy 06/21/2011   Past Surgical History:  Procedure Laterality Date  . EYE SURGERY Right 01/2018   Removed raised area on eye. Dr.Groat   . OVARIAN CYST SURGERY  1986  . PELVIC LAPAROSCOPY    . TONSILECTOMY, ADENOIDECTOMY, BILATERAL MYRINGOTOMY AND TUBES  1974  . VAGINAL HYSTERECTOMY  1983    Allergies  Allergen Reactions  . Aspirin   . Pravastatin Sodium Other (See Comments)    Muscle aches    Allergies as of 02/12/2020      Reactions   Aspirin    Pravastatin Sodium Other (See Comments)   Muscle aches      Medication List       Accurate as of February 12, 2020  1:52 PM. If you have any questions, ask your nurse or doctor.        STOP taking these medications   traZODone 50 MG tablet Commonly known as: DESYREL Stopped  by: [name], NP     TAKE these medications   Accu-Chek Aviva Plus test strip Generic drug: glucose blood USE TO TEST BLOOD SUGAR TWICE DAILY   Accu-Chek Aviva Plus w/Device Kit Use to test blood sugar twice daily. Dx E11.42   Accu-Chek Softclix Lancet Dev Kit Use to test blood sugar. Dx: E11.42   Accu-Chek Softclix Lancets lancets USE  TO TEST BLOOD SUGAR ONE TIME DAILY FOR DIABETES   Align 4 MG Caps Take 1 capsule by mouth daily.   amLODipine 5 MG tablet Commonly known as: NORVASC Take 1 tablet daily   cholecalciferol 1000 units tablet Commonly known as: VITAMIN D Take 1,000 Units by mouth daily.   Fish Oil 1000 MG  Caps Take 2 capsules by mouth 2 (two) times a week.   fluticasone 50 MCG/ACT nasal spray Commonly known as: FLONASE Place 2 sprays into both nostrils as needed.   loratadine 10 MG tablet Commonly known as: CLARITIN Take 10 mg by mouth daily.   Lumigan 0.01 % Soln Generic drug: bimatoprost Place 1 drop into the right eye at bedtime.   metFORMIN 500 MG tablet Commonly known as: GLUCOPHAGE TAKE 2 TABLETS (1,000 MG TOTAL) BY MOUTH DAILY WITH BREAKFAST.   multivitamin-lutein Caps capsule Take 1 capsule by mouth daily.   multivitamin with minerals tablet Take 1 tablet by mouth daily.   polyethylene glycol 17 g packet Commonly known as: MIRALAX / GLYCOLAX Take 17 g by mouth daily as needed. For constipation   prednisoLONE acetate 1 % ophthalmic suspension Commonly known as: PRED FORTE Place 1 drop into the left eye in the morning, at noon, in the evening, and at bedtime.   rosuvastatin 5 MG tablet Commonly known as: CRESTOR Take 1 tablet (5 mg total) by mouth daily.   senna-docusate 8.6-50 MG tablet Commonly known as: Senokot-S Take 1 tablet by mouth at bedtime.   SYSTANE COMPLETE OP Place 1 drop into both eyes in the morning, at noon, in the evening, and at bedtime.       Review of Systems  Constitutional: Negative for appetite change, chills, fatigue and fever.  HENT: Negative for congestion, rhinorrhea, sinus pressure, sinus pain, sneezing, sore throat and trouble swallowing.   Eyes: Positive for visual disturbance. Negative for discharge, redness and itching.  Respiratory: Negative for cough, chest tightness, shortness of breath and wheezing.   Cardiovascular: Negative for chest pain, palpitations and leg swelling.  Gastrointestinal: Positive for constipation. Negative for abdominal distention, abdominal pain, diarrhea, nausea and vomiting.  Genitourinary: Negative for difficulty urinating, dysuria, flank pain, frequency and urgency.  Musculoskeletal: Positive for  arthralgias and gait problem. Negative for joint swelling and myalgias.  Skin: Negative for color change and pallor.  Neurological: Negative for dizziness, seizures, speech difficulty, weakness, light-headedness, numbness and headaches.  Hematological: Does not bruise/bleed easily.  Psychiatric/Behavioral: Positive for sleep disturbance. Negative for agitation, behavioral problems and confusion. The patient is not nervous/anxious.     Immunization History  Administered Date(s) Administered  . Fluad Quad(high Dose 65+) 02/11/2019  . Influenza, High Dose Seasonal PF 01/28/2018  . Influenza,inj,Quad PF,6+ Mos 02/03/2016, 01/23/2017  . Moderna SARS-COVID-2 Vaccination 06/22/2019, 07/24/2019  . Pneumococcal Conjugate-13 07/30/2014  . Pneumococcal Polysaccharide-23 11/17/2005, 05/20/2012  . Td 08/18/2010   Pertinent  Health Maintenance Due  Topic Date Due  . FOOT EXAM  01/23/2018  . INFLUENZA VACCINE  11/15/2019  . HEMOGLOBIN A1C  08/08/2020  . URINE MICROALBUMIN  08/11/2020  . OPHTHALMOLOGY EXAM  10/26/2020  .  MAMMOGRAM  11/05/2021  . COLONOSCOPY  09/05/2025  . DEXA SCAN  Completed  . PNA vac Low Risk Adult  Completed   Fall Risk  02/12/2020 12/25/2019 08/12/2019 03/04/2019 02/11/2019  Falls in the past year? 0 0 0 0 0  Comment - - Feels off balance most of the time, but no falls - -  Number falls in past yr: 0 0 - 0 -  Injury with Fall? 0 0 - 0 -   Functional Status Survey:    Vitals:   02/12/20 1332  BP: (!) 98/58  Pulse: 89  Resp: 16  Temp: 98.2 F (36.8 C)  SpO2: 98%  Weight: 149 lb 3.2 oz (67.7 kg)  Height: '5\' 6"'  (1.676 m)   Body mass index is 24.08 kg/m. Physical Exam Vitals reviewed.  Constitutional:      General: She is not in acute distress.    Appearance: She is normal weight. She is not ill-appearing.  HENT:     Head: Normocephalic.     Right Ear: Tympanic membrane, ear canal and external ear normal. There is no impacted cerumen.     Left Ear: Tympanic  membrane, ear canal and external ear normal. There is no impacted cerumen.     Nose: Nose normal. No congestion or rhinorrhea.     Mouth/Throat:     Mouth: Mucous membranes are moist.     Pharynx: Oropharynx is clear. No oropharyngeal exudate or posterior oropharyngeal erythema.  Eyes:     General: No scleral icterus.       Right eye: No discharge.        Left eye: No discharge.     Extraocular Movements: Extraocular movements intact.     Conjunctiva/sclera: Conjunctivae normal.     Pupils: Pupils are equal, round, and reactive to light.  Neck:     Vascular: No carotid bruit.  Cardiovascular:     Rate and Rhythm: Normal rate and regular rhythm.     Pulses: Normal pulses.     Heart sounds: Normal heart sounds. No murmur heard.  No friction rub. No gallop.   Pulmonary:     Effort: Pulmonary effort is normal. No respiratory distress.     Breath sounds: Normal breath sounds. No wheezing, rhonchi or rales.  Chest:     Chest wall: No tenderness.  Abdominal:     General: Bowel sounds are normal. There is no distension.     Palpations: Abdomen is soft. There is no mass.     Tenderness: There is no abdominal tenderness. There is no right CVA tenderness, left CVA tenderness, guarding or rebound.  Musculoskeletal:        General: No swelling or tenderness.     Cervical back: Normal range of motion. No rigidity or tenderness.     Right lower leg: No edema.     Left lower leg: No edema.     Comments: Unsteady gait,knee pain   Lymphadenopathy:     Cervical: No cervical adenopathy.  Skin:    General: Skin is warm and dry.     Coloration: Skin is not jaundiced or pale.     Findings: No bruising, erythema or rash.  Neurological:     Mental Status: She is alert and oriented to person, place, and time.     Cranial Nerves: No cranial nerve deficit.     Sensory: No sensory deficit.     Motor: No weakness.     Coordination: Coordination normal.     Gait:  Gait abnormal.  Psychiatric:         Mood and Affect: Mood normal.        Speech: Speech normal.        Behavior: Behavior normal.        Thought Content: Thought content normal.        Judgment: Judgment normal.     Labs reviewed: Recent Labs    08/07/19 0801 10/08/19 0751 02/08/20 0805  NA 142 143 142  K 4.0 4.4 4.4  CL 105 103 105  CO2 '26 25 28  ' GLUCOSE 138* 118* 132*  BUN '20 16 19  ' CREATININE 1.05* 0.99 0.98*  CALCIUM 9.0 9.5 9.7   Recent Labs    08/07/19 0801 02/08/20 0805  AST 18 19  ALT 12 15  BILITOT 0.4 0.5  PROT 6.5 7.2   Recent Labs    08/07/19 0801 02/08/20 0805  WBC 5.5 5.3  NEUTROABS 1,837 1,685  HGB 13.3 14.3  HCT 39.9 43.0  MCV 85.4 86.9  PLT 186 185   Lab Results  Component Value Date   TSH 2.10 02/08/2020   Lab Results  Component Value Date   HGBA1C 6.7 (H) 02/08/2020   Lab Results  Component Value Date   CHOL 176 02/08/2020   HDL 67 02/08/2020   LDLCALC 83 02/08/2020   TRIG 162 (H) 02/08/2020   CHOLHDL 2.6 02/08/2020    Significant Diagnostic Results in last 30 days:  No results found.  Assessment/Plan 1. Hypertension associated with diabetes ([place]) B/p well under controlled.Encouraged to check blood pressure at home and Notify provider for any SBP consistently  < 100 or > 140 Continue on Amlodipine and Metoprolol - continue on heart healthy diet and exercise    - CBC with Differential/Platelet; Future - CMP with eGFR([facility]); Future  2. Type 2 diabetes mellitus with diabetic polyneuropathy, without long-term current use of insulin ([facility]) Lab Results  Component Value Date   HGBA1C 6.7 (H) 02/08/2020  Continue on metformin  Continue with low carbohydrates and low sugary food diet.Exercise as tolerated. - On ASA and Statin. Influenza vaccine administered by CMA today. - continue to follow up with Ophthalmologist Dr.Groat for eye exam. - continue to follow up with Podiatrist.  - TSH; Future - Hgb A1C,Future    3. Gastroesophageal reflux disease without  esophagitis Symptoms under controlled. Continue on Omeprazole 40 mg capsule daily.   4. Insomnia, unspecified type Continue on Trazodone   5. Seasonal allergies Continue on OTC Claritin and Flonase   6. Unsteady gait Fall and safety precaution discussed.continue to walk with a cane.   7. Hyperlipidemia associated with type 2 diabetes mellitus ([place]) Latest LDL at goal.TRG slightly high but has improved compared to previous.Advised to continue on a low carbo,low sats and high vegetable diet.  - Lipid panel; Future  8. Need for influenza vaccination Asymptomatic.Afebrile. Flu vaccine administered by CMA today.No reaction noted.  - Flu Vaccine QUAD High Dose(Fluad)  Family/ staff Communication: Reviewed plan of care with patient  Labs/tests ordered: - CBC with Differential/Platelet; Future - CMP with eGFR([facility]); Future - TSH; Future - Lipid panel; Future - Hgb A1C,Future   Next Appointment : 6 months for medical management of chronic issues   [name], NP

## 2020-02-15 ENCOUNTER — Other Ambulatory Visit: Payer: Self-pay | Admitting: *Deleted

## 2020-02-15 DIAGNOSIS — E1142 Type 2 diabetes mellitus with diabetic polyneuropathy: Secondary | ICD-10-CM

## 2020-02-15 DIAGNOSIS — I152 Hypertension secondary to endocrine disorders: Secondary | ICD-10-CM

## 2020-02-15 DIAGNOSIS — E1159 Type 2 diabetes mellitus with other circulatory complications: Secondary | ICD-10-CM

## 2020-02-15 DIAGNOSIS — E785 Hyperlipidemia, unspecified: Secondary | ICD-10-CM

## 2020-02-15 DIAGNOSIS — E1169 Type 2 diabetes mellitus with other specified complication: Secondary | ICD-10-CM

## 2020-02-15 MED ORDER — ROSUVASTATIN CALCIUM 5 MG PO TABS: 5.0000 mg | ORAL_TABLET | Freq: Every day | ORAL | 1 refills | Status: DC

## 2020-02-15 MED ORDER — AMLODIPINE BESYLATE 5 MG PO TABS: ORAL_TABLET | ORAL | 1 refills | Status: DC

## 2020-02-15 MED ORDER — METFORMIN HCL 500 MG PO TABS: 1000.0000 mg | ORAL_TABLET | Freq: Every day | ORAL | 1 refills | Status: DC

## 2020-02-15 NOTE — Telephone Encounter (Signed)
Patient called requesting refills to be sent to [facility] and sent to [facility] for approval due to [place].

## 2020-02-18 DIAGNOSIS — H524 Presbyopia: Secondary | ICD-10-CM | POA: Diagnosis not present

## 2020-02-18 DIAGNOSIS — H52223 Regular astigmatism, bilateral: Secondary | ICD-10-CM | POA: Diagnosis not present

## 2020-03-03 ENCOUNTER — Other Ambulatory Visit: Payer: Self-pay

## 2020-03-03 ENCOUNTER — Ambulatory Visit: Payer: Medicare HMO | Admitting: Obstetrics & Gynecology

## 2020-03-03 ENCOUNTER — Encounter: Payer: Self-pay | Admitting: Obstetrics & Gynecology

## 2020-03-03 VITALS — BP 122/70 | Ht 65.5 in | Wt 150.0 lb

## 2020-03-03 DIAGNOSIS — M8589 Other specified disorders of bone density and structure, multiple sites: Secondary | ICD-10-CM

## 2020-03-03 DIAGNOSIS — Z9189 Other specified personal risk factors, not elsewhere classified: Secondary | ICD-10-CM | POA: Diagnosis not present

## 2020-03-03 DIAGNOSIS — Z01419 Encounter for gynecological examination (general) (routine) without abnormal findings: Secondary | ICD-10-CM | POA: Diagnosis not present

## 2020-03-03 DIAGNOSIS — Z780 Asymptomatic menopausal state: Secondary | ICD-10-CM

## 2020-03-03 DIAGNOSIS — Z90710 Acquired absence of both cervix and uterus: Secondary | ICD-10-CM

## 2020-03-03 NOTE — Progress Notes (Signed)
Sierra Natal V. [birth year] [id number]   History:    74 y.o. [id number] Single  RP:  Established patient presenting for annual gyn exam   HPI: S/P Total Hysterectomy for menorrhagia.  Menopause on no HRT.  No change in occasional lower abdominal discomfort with tendency for constipation.  Urine normal. Abstinent.  BMI 24.58.  Walking 3-5 times a week.  Health labs with Fam MD.  Sierra Ray 2017.  Past medical history,surgical history, family history and social history were all reviewed and documented in the EPIC chart.  Gynecologic History No LMP recorded. Patient has had a hysterectomy.  Obstetric History OB History  Gravida Para Term Preterm AB Living  5 4 4   1 4   SAB TAB Ectopic Multiple Live Births  1            # Outcome Date GA Lbr Len/2nd Weight Sex Delivery Anes PTL Lv  5 Term           4 Term           3 Term           2 Term           1 SAB             Obstetric Comments  I adopted child     ROS: A ROS was performed and pertinent positives and negatives are included in the history.  GENERAL: No fevers or chills. HEENT: No change in vision, no earache, sore throat or sinus congestion. NECK: No pain or stiffness. CARDIOVASCULAR: No chest pain or pressure. No palpitations. PULMONARY: No shortness of breath, cough or wheeze. GASTROINTESTINAL: No abdominal pain, nausea, vomiting or diarrhea, melena or bright red blood per rectum. GENITOURINARY: No urinary frequency, urgency, hesitancy or dysuria. MUSCULOSKELETAL: No joint or muscle pain, no back pain, no recent trauma. DERMATOLOGIC: No rash, no itching, no lesions. ENDOCRINE: No polyuria, polydipsia, no heat or cold intolerance. No recent change in weight. HEMATOLOGICAL: No anemia or easy bruising or bleeding. NEUROLOGIC: No headache, seizures, numbness, tingling or weakness. PSYCHIATRIC: No depression, no loss of interest in normal activity or change in sleep pattern.     Exam:   BP 122/70   Ht 5' 5.5" (1.664 m)    Wt 150 lb (68 kg)   BMI 24.58 kg/m   Body mass index is 24.58 kg/m.  General appearance : Well developed well nourished female. No acute distress HEENT: Eyes: no retinal hemorrhage or exudates,  Neck supple, trachea midline, no carotid bruits, no thyroidmegaly Lungs: Clear to auscultation, no rhonchi or wheezes, or rib retractions  Heart: Regular rate and rhythm, no murmurs or gallops Breast:Examined in sitting and supine position were symmetrical in appearance, no palpable masses or tenderness,  no skin retraction, no nipple inversion, no nipple discharge, no skin discoloration, no axillary or supraclavicular lymphadenopathy Abdomen: no palpable masses or tenderness, no rebound or guarding Extremities: no edema or skin discoloration or tenderness  Pelvic: Vulva: Normal             Vagina: No gross lesions or discharge  Cervix/Uterus absent  Adnexa  Without masses or tenderness  Anus: Normal   Assessment/Plan:  74 y.o. female for annual exam   1. Well female exam with routine gynecological exam Gynecologic exam status post total hysterectomy.  No indication to repeat a Pap test.  Breast exam normal.  Screening mammogram July 2021 was negative.  Colonoscopy 2017.  Health labs with family physician.  Body mass  index is good at 24.58.  Continue with fitness and healthy nutrition.  2. S/P total hysterectomy  3. Postmenopause Well on no HRT.    4. Osteopenia of multiple sites Bone Density scheduled at [facility].  Vit D supplements, Ca++ intake of 1500 mg daily and regular weightbearing physical activities.  Sierra Bruins MD, 9:27 AM 03/03/2020

## 2020-03-04 ENCOUNTER — Telehealth: Payer: Self-pay | Admitting: *Deleted

## 2020-03-04 NOTE — Telephone Encounter (Signed)
Received [employer] [phone] Paperwork.   Placed Paperwork in [name]'s folder to review, Fill out and sign.   Call patient once completed because she has to sign form.

## 2020-03-07 NOTE — Telephone Encounter (Signed)
Patient notified form ready. Patient to pick up and send in because it requires her signature.  Copy sent for scanning.  Original left up front for pick up.

## 2020-03-08 DIAGNOSIS — R2681 Unsteadiness on feet: Secondary | ICD-10-CM | POA: Diagnosis not present

## 2020-03-08 DIAGNOSIS — M1990 Unspecified osteoarthritis, unspecified site: Secondary | ICD-10-CM | POA: Diagnosis not present

## 2020-03-08 DIAGNOSIS — E1169 Type 2 diabetes mellitus with other specified complication: Secondary | ICD-10-CM | POA: Diagnosis not present

## 2020-03-08 DIAGNOSIS — I10 Essential (primary) hypertension: Secondary | ICD-10-CM | POA: Diagnosis not present

## 2020-03-08 DIAGNOSIS — J309 Allergic rhinitis, unspecified: Secondary | ICD-10-CM | POA: Diagnosis not present

## 2020-03-08 DIAGNOSIS — E1136 Type 2 diabetes mellitus with diabetic cataract: Secondary | ICD-10-CM | POA: Diagnosis not present

## 2020-03-08 DIAGNOSIS — E1140 Type 2 diabetes mellitus with diabetic neuropathy, unspecified: Secondary | ICD-10-CM | POA: Diagnosis not present

## 2020-03-08 DIAGNOSIS — E663 Overweight: Secondary | ICD-10-CM | POA: Diagnosis not present

## 2020-03-08 DIAGNOSIS — E785 Hyperlipidemia, unspecified: Secondary | ICD-10-CM | POA: Diagnosis not present

## 2020-03-22 ENCOUNTER — Encounter (INDEPENDENT_AMBULATORY_CARE_PROVIDER_SITE_OTHER): Payer: Self-pay | Admitting: Ophthalmology

## 2020-03-22 ENCOUNTER — Other Ambulatory Visit: Payer: Self-pay

## 2020-03-22 ENCOUNTER — Ambulatory Visit (INDEPENDENT_AMBULATORY_CARE_PROVIDER_SITE_OTHER): Payer: Medicare HMO | Admitting: Ophthalmology

## 2020-03-22 DIAGNOSIS — H43811 Vitreous degeneration, right eye: Secondary | ICD-10-CM | POA: Diagnosis not present

## 2020-03-22 DIAGNOSIS — H2511 Age-related nuclear cataract, right eye: Secondary | ICD-10-CM | POA: Diagnosis not present

## 2020-03-22 DIAGNOSIS — H43822 Vitreomacular adhesion, left eye: Secondary | ICD-10-CM

## 2020-03-22 DIAGNOSIS — E1142 Type 2 diabetes mellitus with diabetic polyneuropathy: Secondary | ICD-10-CM | POA: Diagnosis not present

## 2020-03-22 DIAGNOSIS — E119 Type 2 diabetes mellitus without complications: Secondary | ICD-10-CM

## 2020-03-22 NOTE — Assessment & Plan Note (Signed)
No detectable diabetic retinopathy at this time 

## 2020-03-22 NOTE — Progress Notes (Signed)
03/22/2020     CHIEF COMPLAINT Patient presents for Retina Evaluation   HISTORY OF PRESENT ILLNESS: Sierra Cauble V. is a 74 y.o. female who presents to the clinic today for:   HPI    Retina Evaluation    In left eye.  This started 3 months ago.  Duration of 3 months.  Associated Symptoms Flashes.  Context:  distance vision, mid-range vision and near vision.          Comments    NP VMT OS - Ref'd by Dr. Midge Aver  Pt c/o flashes of light inferiorly OS off and on x 3 months since cataract surgery in 12/2019. Pt reports stable VA OD. Pt c/o intermittent blur when reading or watching words on the TV OS. Pt is using Xiidra for dryness OU.  Patient is having visual difficulties distance vision left eye even with her new prescription lenses       Last edited by Hurman Horn, MD on 03/22/2020  3:26 PM. (History)      Referring physician: [name], NP [place],  [place] [address]  HISTORICAL INFORMATION:   Selected notes from the MEDICAL RECORD NUMBER    Lab Results  Component Value Date   HGBA1C 6.7 (H) 02/08/2020     CURRENT MEDICATIONS: Current Outpatient Medications (Ophthalmic Drugs)  Medication Sig  . LUMIGAN 0.01 % SOLN Place 1 drop into the right eye at bedtime.  . prednisoLONE acetate (PRED FORTE) 1 % ophthalmic suspension Place 1 drop into the left eye in the morning, at noon, in the evening, and at bedtime.  Marland Kitchen Propylene Glycol (SYSTANE COMPLETE OP) Place 1 drop into both eyes in the morning, at noon, in the evening, and at bedtime.   No current facility-administered medications for this visit. (Ophthalmic Drugs)   Current Outpatient Medications (Other)  Medication Sig  . Accu-Chek Softclix Lancets lancets USE  TO TEST BLOOD SUGAR ONE TIME DAILY FOR DIABETES  . amLODipine (NORVASC) 5 MG tablet Take 1 tablet daily  . Blood Glucose Monitoring Suppl (ACCU-CHEK AVIVA PLUS) w/Device KIT Use to test blood sugar twice daily. Dx E11.42  .  cholecalciferol (VITAMIN D) 1000 UNITS tablet Take 1,000 Units by mouth daily.   . fluticasone (FLONASE) 50 MCG/ACT nasal spray Place 2 sprays into both nostrils as needed.   Marland Kitchen glucose blood (ACCU-CHEK AVIVA PLUS) test strip USE TO TEST BLOOD SUGAR TWICE DAILY  . Lancets Misc. (ACCU-CHEK SOFTCLIX LANCET DEV) KIT Use to test blood sugar. Dx: E11.42  . loratadine (CLARITIN) 10 MG tablet Take 10 mg by mouth daily.  . metFORMIN (GLUCOPHAGE) 500 MG tablet Take 2 tablets (1,000 mg total) by mouth daily with breakfast.  . Multiple Vitamins-Minerals (MULTIVITAMIN WITH MINERALS) tablet Take 1 tablet by mouth daily.  . multivitamin-lutein (OCUVITE-LUTEIN) CAPS capsule Take 1 capsule by mouth daily.  . Omega-3 Fatty Acids (FISH OIL) 1000 MG CAPS Take 2 capsules by mouth 2 (two) times a week.   . polyethylene glycol (MIRALAX / GLYCOLAX) packet Take 17 g by mouth daily as needed. For constipation  . Probiotic Product (ALIGN) 4 MG CAPS Take 1 capsule by mouth daily.   . rosuvastatin (CRESTOR) 5 MG tablet Take 1 tablet (5 mg total) by mouth daily.  Marland Kitchen senna-docusate (SENOKOT-S) 8.6-50 MG tablet Take 1 tablet by mouth at bedtime.   No current facility-administered medications for this visit. (Other)      REVIEW OF SYSTEMS:    ALLERGIES Allergies  Allergen Reactions  . Aspirin   . Pravastatin Sodium Other (See Comments)    Muscle aches  . Restasis [Cyclosporine] Other (See Comments)    PAST MEDICAL HISTORY Past Medical History:  Diagnosis Date  . Abnormal Pap smear 05/08/2011   ASC-US +HPV  . Arthritis   . Asthma   . Constipation   . Diabetes mellitus type 2 with neurological manifestations ([facility])   . Diabetic peripheral neuropathy ([place])   . Generalized osteoarthritis   . GERD (gastroesophageal reflux disease)   . Hx of seasonal allergies   . Hyperlipidemia   . Hypertension   . IBS (irritable bowel syndrome)   . S/P total hysterectomy 06/21/2011   Past Surgical History:  Procedure  Laterality Date  . CATARACT EXTRACTION Left   . EYE SURGERY Right 01/2018   Removed raised area on eye. Dr.Groat   . OVARIAN CYST SURGERY  1986  . PELVIC LAPAROSCOPY    . TONSILECTOMY, ADENOIDECTOMY, BILATERAL MYRINGOTOMY AND TUBES  1974  . VAGINAL HYSTERECTOMY  1983    FAMILY HISTORY Family History  Problem Relation Age of Onset  . Diabetes Father   . Heart disease Father   . Diabetes Mother   . Cancer Mother        colon   . Pancreatic cancer Brother   . Pancreatic cancer Sister   . Cancer Paternal Grandmother        liver   . Breast cancer Cousin   . Breast cancer Maternal Aunt     SOCIAL HISTORY Social History   Tobacco Use  . Smoking status: Never Smoker  . Smokeless tobacco: Never Used  Vaping Use  . Vaping Use: Never used  Substance Use Topics  . Alcohol use: No  . Drug use: No         OPHTHALMIC EXAM: Base Eye Exam    Visual Acuity (ETDRS)      Right Left   Dist cc 20/25 -2 20/30 +2   Dist ph cc  NI   Correction: Glasses       Tonometry (Tonopen, 2:25 PM)      Right Left   Pressure 16 14       Pupils      Pupils Shape React APD   Right PERRL Round Brisk None   Left PERRL Round Brisk None       Visual Fields (Counting fingers)      Left Right    Full Full       Extraocular Movement      Right Left    Full Full       Neuro/Psych    Oriented x3: Yes   Mood/Affect: Normal       Dilation    Both eyes: 1.0% Mydriacyl, 2.5% Phenylephrine @ 2:29 PM        Slit Lamp and Fundus Exam    External Exam      Right Left   External Normal Normal       Slit Lamp Exam      Right Left   Lids/Lashes Normal Normal   Conjunctiva/Sclera White and quiet White and quiet   Cornea Clear Clear   Anterior Chamber Deep and quiet Deep and quiet   Iris Round and reactive Round and reactive   Lens 3+ Nuclear sclerosis Centered posterior chamber intraocular lens   Anterior Vitreous Normal Normal       Fundus Exam      Right Left   Posterior  Vitreous Posterior  vitreous detachment Central vitreous floaters, no Weiss ring seen at this time   Disc Normal Normal   C/D Ratio 0.55 0.55   Macula Normal Normal   Vessels Normal, , no DR Normal, , no DR   Periphery Normal Normal          IMAGING AND PROCEDURES  Imaging and Procedures for 03/22/20  OCT, Retina - OU - Both Eyes       Right Eye Quality was good. Scan locations included subfoveal. Central Foveal Thickness: 208. Progression has no prior data. Findings include central retinal atrophy.   Left Eye Quality was good. Scan locations included subfoveal. Central Foveal Thickness: 216. Progression has no prior data. Findings include vitreomacular adhesion , vitreous traction, central retinal atrophy.   Notes OS with vitreal macular traction, inner foveal change and distortion, no outer retinal changes  OD with incidental posterior vitreous detachment                ASSESSMENT/PLAN:  Type 2 diabetes mellitus with diabetic polyneuropathy, without long-term current use of insulin (HCC) No detectable diabetic retinopathy at this time  Vitreomacular traction, left Minor inner foveal traction of the left eye with no outer retinal distortion.  This type of vitreomacular traction may in fact have some visual acuity impact.  I explained to the patient that observation may allow for spontaneous release of the vitreous traction, observation may also disclose no change over the coming weeks and months and more rarely observation with this form of VMT is not likely to trigger a macular hole at this stage.  Therefore I have recommended the patient in fact proceed with cataract extraction with intraocular lens placement of the right eye confidently knowing that there is no traction on the macula right eye, moreover if acuity improves, she would have then ample time to simply observe and we can both monitor the macular findings in the left eye over the coming weeks months or  longer.  I did explain the patient that there is probably a 50% chance of visual acuity improvement with surgical intervention since there is no significant outer retinal distortion on the VMT OS.  I have also explained the patient if this was was my eye, in my body, I would have cataract surgery on the right eye first, to enjoy the improved acuity and then have the freedom to make a decision later on about the left eye      ICD-10-CM   1. Vitreomacular traction, left  H43.822 OCT, Retina - OU - Both Eyes  2. Posterior vitreous detachment of right eye  H43.811 OCT, Retina - OU - Both Eyes  3. Diabetes mellitus without complication ([facility])  I75.7   4. Type 2 diabetes mellitus with diabetic polyneuropathy, without long-term current use of insulin (HCC)  E11.42   5. Nuclear sclerotic cataract of right eye  H25.11     1.  Patient asked to return to see Dr. Carolynn Sayers for consideration to proceed with cataract extraction with intraocular lens placement OD  2.  Recommend simple monitoring in the macular findings of the left eye.  Should visual acuity declined further, certainly surgical intervention would be appropriate if visual acuity, OU, improved substantially post cataract, she will have plenty of time to simply observe the left eye to look for spontaneous resolution, as well as we can all contemplate surgical intervention.  3.  As an aside I did explain to the patient that surgery for vitreomacular traction while shorten duration, less than  15 minutes, is very effective particularly when visual acuity is already impacted in a substantial way  Ophthalmic Meds Ordered this visit:  No orders of the defined types were placed in this encounter.      Return in about 6 months (around 09/20/2020) for DILATE OU, OCT.  There are no Patient Instructions on file for this visit.   Explained the diagnoses, plan, and follow up with the patient and they expressed understanding.  Patient expressed understanding  of the importance of proper follow up care.   Clent Demark Derral Colucci M.D. Diseases & Surgery of the Retina and Vitreous [facility]ina [facility] Diabetic [place] 03/22/20     Abbreviations: M myopia (nearsighted); A astigmatism; H hyperopia (farsighted); P presbyopia; Mrx spectacle prescription;  CTL contact lenses; OD right eye; OS left eye; OU both eyes  XT exotropia; ET esotropia; PEK punctate epithelial keratitis; PEE punctate epithelial erosions; DES dry eye syndrome; MGD meibomian gland dysfunction; ATs artificial tears; PFAT's preservative free artificial tears; [place] nuclear sclerotic cataract; PSC posterior subcapsular cataract; ERM epi-retinal membrane; PVD posterior vitreous detachment; RD retinal detachment; DM diabetes mellitus; DR diabetic retinopathy; NPDR non-proliferative diabetic retinopathy; PDR proliferative diabetic retinopathy; CSME clinically significant macular edema; DME diabetic macular edema; dbh dot blot hemorrhages; CWS cotton wool spot; POAG primary open angle glaucoma; C/D cup-to-disc ratio; HVF humphrey visual field; GVF goldmann visual field; OCT optical coherence tomography; IOP intraocular pressure; BRVO Branch retinal vein occlusion; CRVO central retinal vein occlusion; CRAO central retinal artery occlusion; BRAO branch retinal artery occlusion; RT retinal tear; SB scleral buckle; PPV pars plana vitrectomy; VH Vitreous hemorrhage; PRP panretinal laser photocoagulation; IVK intravitreal kenalog; VMT vitreomacular traction; MH Macular hole;  NVD neovascularization of the disc; NVE neovascularization elsewhere; AREDS age related eye disease study; ARMD age related macular degeneration; POAG primary open angle glaucoma; EBMD epithelial/anterior basement membrane dystrophy; ACIOL anterior chamber intraocular lens; IOL intraocular lens; PCIOL posterior chamber intraocular lens; Phaco/IOL phacoemulsification with intraocular lens placement; [place] photorefractive keratectomy; LASIK laser assisted in  situ keratomileusis; HTN hypertension; DM diabetes mellitus; COPD chronic obstructive pulmonary disease

## 2020-03-22 NOTE — Assessment & Plan Note (Signed)
Minor inner foveal traction of the left eye with no outer retinal distortion.  This type of vitreomacular traction may in fact have some visual acuity impact.  I explained to the patient that observation may allow for spontaneous release of the vitreous traction, observation may also disclose no change over the coming weeks and months and more rarely observation with this form of VMT is not likely to trigger a macular hole at this stage.  Therefore I have recommended the patient in fact proceed with cataract extraction with intraocular lens placement of the right eye confidently knowing that there is no traction on the macula right eye, moreover if acuity improves, she would have then ample time to simply observe and we can both monitor the macular findings in the left eye over the coming weeks months or longer.  I did explain the patient that there is probably a 50% chance of visual acuity improvement with surgical intervention since there is no significant outer retinal distortion on the VMT OS.  I have also explained the patient if this was was my eye, in my body, I would have cataract surgery on the right eye first, to enjoy the improved acuity and then have the freedom to make a decision later on about the left eye

## 2020-03-22 NOTE — Assessment & Plan Note (Signed)
Cataract(s) account for the patient's complaint. I discussed the risks and benefits of cataract surgery. Options were explained to the patient. The patient understands that new glasses may not improve their vision and desires to have cataract surgery. I have recommended follow up with their general eye care doctor for evaluation and consideration of cataract extraction with new intraocular lens insertion.  OD, I will recommend that Dr. Richardson Chiquito proceed with cataract extraction with intraocular lens placement right eye in order to maximize visual potential, the patient thoroughly agrees and is eager to have this done as she now notices the right eye is triggering visual difficulties in her life.  See the comments and vitreous traction of the left eye for further discussion regarding this issue

## 2020-04-06 ENCOUNTER — Telehealth: Payer: Self-pay | Admitting: *Deleted

## 2020-04-06 NOTE — Telephone Encounter (Signed)
Pam with [employer] called requesting refill of Trazodone 50mg  1/2 -1 tablet daily as needed and Metoprolol Tartrate 25mg  daily.   Medications is not on patient's current medication list.   I called patient to confirm medications and patient stated that she DOES NOT want the refills. Stated the Cardiologist took her off of the Metoprolol.  Stated that she is taking the Trazodone as needed but doesn't need refilled.   Is it ok to add the Trazodone back to patient's current medication list?  Please Advise.

## 2020-04-06 NOTE — Telephone Encounter (Signed)
Medication list updated.

## 2020-04-06 NOTE — Telephone Encounter (Signed)
Add Trazodone to current medication list.

## 2020-04-07 ENCOUNTER — Encounter: Payer: Medicare HMO | Admitting: Obstetrics & Gynecology

## 2020-04-12 ENCOUNTER — Other Ambulatory Visit: Payer: Self-pay

## 2020-04-12 DIAGNOSIS — E1142 Type 2 diabetes mellitus with diabetic polyneuropathy: Secondary | ICD-10-CM

## 2020-04-12 MED ORDER — ACCU-CHEK AVIVA PLUS VI STRP: ORAL_STRIP | 1 refills | Status: DC

## 2020-04-19 ENCOUNTER — Ambulatory Visit (INDEPENDENT_AMBULATORY_CARE_PROVIDER_SITE_OTHER): Payer: Medicare HMO | Admitting: Ophthalmology

## 2020-04-19 ENCOUNTER — Other Ambulatory Visit: Payer: Self-pay

## 2020-04-19 ENCOUNTER — Encounter (INDEPENDENT_AMBULATORY_CARE_PROVIDER_SITE_OTHER): Payer: Self-pay | Admitting: Ophthalmology

## 2020-04-19 DIAGNOSIS — H43822 Vitreomacular adhesion, left eye: Secondary | ICD-10-CM

## 2020-04-19 DIAGNOSIS — H43812 Vitreous degeneration, left eye: Secondary | ICD-10-CM | POA: Diagnosis not present

## 2020-04-19 DIAGNOSIS — H43392 Other vitreous opacities, left eye: Secondary | ICD-10-CM | POA: Insufficient documentation

## 2020-04-19 DIAGNOSIS — H43811 Vitreous degeneration, right eye: Secondary | ICD-10-CM | POA: Diagnosis not present

## 2020-04-19 DIAGNOSIS — H43813 Vitreous degeneration, bilateral: Secondary | ICD-10-CM

## 2020-04-19 NOTE — Progress Notes (Signed)
04/19/2020     CHIEF COMPLAINT Patient presents for Flashes/floaters (WIP - floaters OS//Pt c/o constant new floaters "like a bug" OS x 4 days. Pt denies flashes or ocular pain. [facility] stable.)   HISTORY OF PRESENT ILLNESS: [place] V. is a 75 y.o. female who presents to the clinic today for:   HPI    Flashes/floaters    In left eye.  This started 4 days ago.  Duration of 4 days.  Duration Constant.  Characterized as spots.  Since onset it is stable.  Associated Symptoms Floaters.  Context:  distance vision, mid-range vision and near vision. Additional comments: WIP - floaters OS  Pt c/o constant new floaters "like a bug" OS x 4 days. Pt denies flashes or ocular pain. [facility] stable.       Last edited by Rockie Neighbours, [place] on 04/19/2020  8:37 AM. (History)      Referring physician: [name], NP [place],  [place] [id number]  HISTORICAL INFORMATION:   Selected notes from the MEDICAL RECORD NUMBER    Lab Results  Component Value Date   HGBA1C 6.7 (H) 02/08/2020     CURRENT MEDICATIONS: Current Outpatient Medications (Ophthalmic Drugs)  Medication Sig  . LUMIGAN 0.01 % SOLN Place 1 drop into the right eye at bedtime.  . prednisoLONE acetate (PRED FORTE) 1 % ophthalmic suspension Place 1 drop into the left eye in the morning, at noon, in the evening, and at bedtime.  Marland Kitchen Propylene Glycol (SYSTANE COMPLETE OP) Place 1 drop into both eyes in the morning, at noon, in the evening, and at bedtime.   No current facility-administered medications for this visit. (Ophthalmic Drugs)   Current Outpatient Medications (Other)  Medication Sig  . Accu-Chek Softclix Lancets lancets USE  TO TEST BLOOD SUGAR ONE TIME DAILY FOR DIABETES  . amLODipine (NORVASC) 5 MG tablet Take 1 tablet daily  . Blood Glucose Monitoring Suppl (ACCU-CHEK AVIVA PLUS) w/Device KIT Use to test blood sugar twice daily. Dx E11.42  . cholecalciferol (VITAMIN D) 1000 UNITS tablet Take 1,000 Units by  mouth daily.   . fluticasone (FLONASE) 50 MCG/ACT nasal spray Place 2 sprays into both nostrils as needed.   Marland Kitchen glucose blood (ACCU-CHEK AVIVA PLUS) test strip USE TO TEST BLOOD SUGAR TWICE DAILY  . Lancets Misc. (ACCU-CHEK SOFTCLIX LANCET DEV) KIT Use to test blood sugar. Dx: E11.42  . loratadine (CLARITIN) 10 MG tablet Take 10 mg by mouth daily.  . metFORMIN (GLUCOPHAGE) 500 MG tablet Take 2 tablets (1,000 mg total) by mouth daily with breakfast.  . Multiple Vitamins-Minerals (MULTIVITAMIN WITH MINERALS) tablet Take 1 tablet by mouth daily.  . multivitamin-lutein (OCUVITE-LUTEIN) CAPS capsule Take 1 capsule by mouth daily.  . Omega-3 Fatty Acids (FISH OIL) 1000 MG CAPS Take 2 capsules by mouth 2 (two) times a week.   . polyethylene glycol (MIRALAX / GLYCOLAX) packet Take 17 g by mouth daily as needed. For constipation  . Probiotic Product (ALIGN) 4 MG CAPS Take 1 capsule by mouth daily.   . rosuvastatin (CRESTOR) 5 MG tablet Take 1 tablet (5 mg total) by mouth daily.  Marland Kitchen senna-docusate (SENOKOT-S) 8.6-50 MG tablet Take 1 tablet by mouth at bedtime.  . traZODone (DESYREL) 50 MG tablet Take 1/2 to 1 tablet by mouth once daily as needed   No current facility-administered medications for this visit. (Other)      REVIEW OF SYSTEMS:    ALLERGIES Allergies  Allergen Reactions  .  Aspirin   . Pravastatin Sodium Other (See Comments)    Muscle aches  . Restasis [Cyclosporine] Other (See Comments)    PAST MEDICAL HISTORY Past Medical History:  Diagnosis Date  . Abnormal Pap smear 05/08/2011   ASC-US +HPV  . Arthritis   . Asthma   . Constipation   . Diabetes mellitus type 2 with neurological manifestations ([place])   . Diabetic peripheral neuropathy ([place])   . Generalized osteoarthritis   . GERD (gastroesophageal reflux disease)   . Hx of seasonal allergies   . Hyperlipidemia   . Hypertension   . IBS (irritable bowel syndrome)   . S/P total hysterectomy 06/21/2011   Past Surgical  History:  Procedure Laterality Date  . CATARACT EXTRACTION Left   . EYE SURGERY Right 01/2018   Removed raised area on eye. Dr.Groat   . OVARIAN CYST SURGERY  1986  . PELVIC LAPAROSCOPY    . TONSILECTOMY, ADENOIDECTOMY, BILATERAL MYRINGOTOMY AND TUBES  1974  . VAGINAL HYSTERECTOMY  1983    FAMILY HISTORY Family History  Problem Relation Age of Onset  . Diabetes Father   . Heart disease Father   . Diabetes Mother   . Cancer Mother        colon   . Pancreatic cancer Brother   . Pancreatic cancer Sister   . Cancer Paternal Grandmother        liver   . Breast cancer Cousin   . Breast cancer Maternal Aunt     SOCIAL HISTORY Social History   Tobacco Use  . Smoking status: Never Smoker  . Smokeless tobacco: Never Used  Vaping Use  . Vaping Use: Never used  Substance Use Topics  . Alcohol use: No  . Drug use: No         OPHTHALMIC EXAM: Base Eye Exam    Visual Acuity (ETDRS)      Right Left   Dist cc 20/30 +1 20/30 +1   Dist ph cc NI 20/25 +1   Correction: Glasses       Tonometry (Tonopen, 8:31 AM)      Right Left   Pressure 11 11       Pupils      Pupils Dark Light Shape React APD   Right PERRL 4 3 Round Brisk None   Left PERRL 4 3 Round Brisk None       Visual Fields (Counting fingers)      Left Right    Full Full       Extraocular Movement      Right Left    Full Full       Neuro/Psych    Oriented x3: Yes   Mood/Affect: Normal       Dilation    Left eye: 1.0% Mydriacyl, 2.5% Phenylephrine @ 8:41 AM        Slit Lamp and Fundus Exam    External Exam      Right Left   External Normal Normal       Slit Lamp Exam      Right Left   Lids/Lashes Normal Normal   Conjunctiva/Sclera White and quiet White and quiet   Cornea Clear Clear   Anterior Chamber Deep and quiet Deep and quiet   Iris Round and reactive Round and reactive   Lens 3+ Nuclear sclerosis Centered posterior chamber intraocular lens   Anterior Vitreous Normal Normal        Fundus Exam      Right Left  Posterior Vitreous  Central vitreous floaters,    Disc  Normal   C/D Ratio  0.55   Macula  Normal   Vessels  Normal, , no DR   Periphery  Normal, no holes and tears, 2.2D and 20 8D lenses          IMAGING AND PROCEDURES  Imaging and Procedures for 04/19/20  Color Fundus Photography Optos - OU - Both Eyes       Right Eye Progression has no prior data. Disc findings include normal observations, increased cup to disc ratio. Macula : normal observations. Vessels : normal observations. Periphery : normal observations.   Left Eye Progression has no prior data. Disc findings include normal observations, increased cup to disc ratio. Macula : normal observations. Vessels : normal observations. Periphery : normal observations.   Notes Minor media opacity OD  Normal retina OU       OCT, Retina - OU - Both Eyes       Right Eye Quality was good. Scan locations included subfoveal. Central Foveal Thickness: 208. Progression has been stable. Findings include normal foveal contour.   Left Eye Quality was good. Scan locations included subfoveal. Central Foveal Thickness: 238. Progression has been stable. Findings include normal foveal contour.   Notes OS, new vitreous floater, perifoveal above and since nasal to the macula, coincident with posterior vitreous detachment and new onset floaters, also no inner foveal distortion from VMT as present on December 2021 OCT.  OD stable                ASSESSMENT/PLAN:  Vitreous floaters, left Onset recently, likely related to release of VMT documented early December 2021 with new onset PVD.  No retinal holes  Posterior vitreous detachment, left eye   The nature of posterior vitreous detachment was discussed with the patient as well as its physiology, its age prevalence, and its possible implication regarding retinal breaks and detachment.  An informational brochure was given to the patient.  All the  patient's questions were answered.  The patient was asked to return if new or different flashes or floaters develops.   Patient was instructed to contact office immediately if any changes were noticed. I explained to the patient that vitreous inside the eye is similar to jello inside a bowl. As the jello melts it can start to pull away from the bowl, similarly the vitreous throughout our lives can begin to pull away from the retina. That process is called a posterior vitreous detachment. In some cases, the vitreous can tug hard enough on the retina to form a retinal tear. I discussed with the patient the signs and symptoms of a retinal detachment.  Do not rub the eye.Posterior vitreous detachment OS, confirmed by OCT with a vitreous floater new on the superior aspect of the macula, will observe      ICD-10-CM   1. Vitreomacular traction, left  H43.822 Color Fundus Photography Optos - OU - Both Eyes    OCT, Retina - OU - Both Eyes  2. Posterior vitreous detachment of right eye  H43.811 Color Fundus Photography Optos - OU - Both Eyes  3. Posterior vitreous detachment, left eye  H43.812   4. Vitreous floaters, left  H43.392     1.  Vitreous floaters left eye causative from posterior vitreous detachment, VMT now released with no inner foveal traction by OCT.  Coincident with posterior vitreous detachment.  Patient informed of symptoms and signs of retinal tear detachment none are present at  this time today  2.  3.  Ophthalmic Meds Ordered this visit:  No orders of the defined types were placed in this encounter.      Return for As scheduled, DILATE OU, OCT.  There are no Patient Instructions on file for this visit.   Explained the diagnoses, plan, and follow up with the patient and they expressed understanding.  Patient expressed understanding of the importance of proper follow up care.   Clent Demark Hyde Sires M.D. Diseases & Surgery of the Retina and Vitreous Retina & Diabetic [place] 04/19/20     Abbreviations: M myopia (nearsighted); A astigmatism; H hyperopia (farsighted); P presbyopia; Mrx spectacle prescription;  CTL contact lenses; OD right eye; OS left eye; OU both eyes  XT exotropia; ET esotropia; PEK punctate epithelial keratitis; PEE punctate epithelial erosions; DES dry eye syndrome; MGD meibomian gland dysfunction; ATs artificial tears; PFAT's preservative free artificial tears; [place] nuclear sclerotic cataract; PSC posterior subcapsular cataract; ERM epi-retinal membrane; PVD posterior vitreous detachment; RD retinal detachment; DM diabetes mellitus; DR diabetic retinopathy; NPDR non-proliferative diabetic retinopathy; PDR proliferative diabetic retinopathy; CSME clinically significant macular edema; DME diabetic macular edema; dbh dot blot hemorrhages; CWS cotton wool spot; POAG primary open angle glaucoma; C/D cup-to-disc ratio; HVF humphrey visual field; GVF goldmann visual field; OCT optical coherence tomography; IOP intraocular pressure; BRVO Branch retinal vein occlusion; CRVO central retinal vein occlusion; CRAO central retinal artery occlusion; BRAO branch retinal artery occlusion; RT retinal tear; SB scleral buckle; PPV pars plana vitrectomy; VH Vitreous hemorrhage; PRP panretinal laser photocoagulation; IVK intravitreal kenalog; VMT vitreomacular traction; MH Macular hole;  NVD neovascularization of the disc; NVE neovascularization elsewhere; AREDS age related eye disease study; ARMD age related macular degeneration; POAG primary open angle glaucoma; EBMD epithelial/anterior basement membrane dystrophy; ACIOL anterior chamber intraocular lens; IOL intraocular lens; PCIOL posterior chamber intraocular lens; Phaco/IOL phacoemulsification with intraocular lens placement; [place] photorefractive keratectomy; LASIK laser assisted in situ keratomileusis; HTN hypertension; DM diabetes mellitus; COPD chronic obstructive pulmonary disease

## 2020-04-19 NOTE — Assessment & Plan Note (Addendum)
The nature of posterior vitreous detachment was discussed with the patient as well as its physiology, its age prevalence, and its possible implication regarding retinal breaks and detachment.  An informational brochure was given to the patient.  All the patient's questions were answered.  The patient was asked to return if new or different flashes or floaters develops.   Patient was instructed to contact office immediately if any changes were noticed. I explained to the patient that vitreous inside the eye is similar to jello inside a bowl. As the jello melts it can start to pull away from the bowl, similarly the vitreous throughout our lives can begin to pull away from the retina. That process is called a posterior vitreous detachment. In some cases, the vitreous can tug hard enough on the retina to form a retinal tear. I discussed with the patient the signs and symptoms of a retinal detachment.  Do not rub the eye.Posterior vitreous detachment OS, confirmed by OCT with a vitreous floater new on the superior aspect of the macula, will observe

## 2020-04-19 NOTE — Assessment & Plan Note (Signed)
Onset recently, likely related to release of VMT documented early December 2021 with new onset PVD.  No retinal holes

## 2020-05-05 ENCOUNTER — Other Ambulatory Visit: Payer: Medicare HMO

## 2020-06-16 ENCOUNTER — Encounter (INDEPENDENT_AMBULATORY_CARE_PROVIDER_SITE_OTHER): Payer: Self-pay

## 2020-08-01 ENCOUNTER — Other Ambulatory Visit: Payer: Self-pay

## 2020-08-01 ENCOUNTER — Other Ambulatory Visit: Payer: Medicare HMO

## 2020-08-01 DIAGNOSIS — E1169 Type 2 diabetes mellitus with other specified complication: Secondary | ICD-10-CM

## 2020-08-01 DIAGNOSIS — E785 Hyperlipidemia, unspecified: Secondary | ICD-10-CM

## 2020-08-01 DIAGNOSIS — E1142 Type 2 diabetes mellitus with diabetic polyneuropathy: Secondary | ICD-10-CM

## 2020-08-01 DIAGNOSIS — I152 Hypertension secondary to endocrine disorders: Secondary | ICD-10-CM

## 2020-08-01 DIAGNOSIS — E1159 Type 2 diabetes mellitus with other circulatory complications: Secondary | ICD-10-CM

## 2020-08-02 LAB — CBC WITH DIFFERENTIAL/PLATELET
Absolute Monocytes: 545 cells/uL (ref 200–950)
Basophils Absolute: 60 cells/uL (ref 0–200)
Basophils Relative: 1.2 %
Eosinophils Absolute: 220 cells/uL (ref 15–500)
Eosinophils Relative: 4.4 %
HCT: 43.8 % (ref 35.0–45.0)
Hemoglobin: 14.5 g/dL (ref 11.7–15.5)
Lymphs Abs: 2275 cells/uL (ref 850–3900)
MCH: 28.5 pg (ref 27.0–33.0)
MCHC: 33.1 g/dL (ref 32.0–36.0)
MCV: 86.2 fL (ref 80.0–100.0)
MPV: 10 fL (ref 7.5–12.5)
Monocytes Relative: 10.9 %
Neutro Abs: 1900 cells/uL (ref 1500–7800)
Neutrophils Relative %: 38 %
Platelets: 173 10*3/uL (ref 140–400)
RBC: 5.08 10*6/uL (ref 3.80–5.10)
RDW: 12.5 % (ref 11.0–15.0)
Total Lymphocyte: 45.5 %
WBC: 5 10*3/uL (ref 3.8–10.8)

## 2020-08-02 LAB — COMPLETE METABOLIC PANEL WITH GFR
AG Ratio: 1.6 (calc) (ref 1.0–2.5)
ALT: 15 U/L (ref 6–29)
AST: 19 U/L (ref 10–35)
Albumin: 4.5 g/dL (ref 3.6–5.1)
Alkaline phosphatase (APISO): 57 U/L (ref 37–153)
BUN: 20 mg/dL (ref 7–25)
CO2: 29 mmol/L (ref 20–32)
Calcium: 9.4 mg/dL (ref 8.6–10.4)
Chloride: 104 mmol/L (ref 98–110)
Creat: 0.92 mg/dL (ref 0.60–0.93)
GFR, Est African American: 72 mL/min/{1.73_m2} (ref >=60)
GFR, Est Non African American: 62 mL/min/{1.73_m2} (ref >=60)
Globulin: 2.8 g/dL (calc) (ref 1.9–3.7)
Glucose, Bld: 126 mg/dL — ABNORMAL HIGH (ref 65–99)
Potassium: 4.2 mmol/L (ref 3.5–5.3)
Sodium: 142 mmol/L (ref 135–146)
Total Bilirubin: 0.5 mg/dL (ref 0.2–1.2)
Total Protein: 7.3 g/dL (ref 6.1–8.1)

## 2020-08-02 LAB — HEMOGLOBIN A1C
Hgb A1c MFr Bld: 6.9 % of total Hgb — ABNORMAL HIGH (ref <5.7)
Mean Plasma Glucose: 151 mg/dL
eAG (mmol/L): 8.4 mmol/L

## 2020-08-02 LAB — LIPID PANEL
Cholesterol: 174 mg/dL (ref <200)
HDL: 78 mg/dL (ref >=50)
LDL Cholesterol (Calc): 73 mg/dL (calc)
Non-HDL Cholesterol (Calc): 96 mg/dL (calc) (ref <130)
Total CHOL/HDL Ratio: 2.2 (calc) (ref <5.0)
Triglycerides: 156 mg/dL — ABNORMAL HIGH (ref <150)

## 2020-08-02 LAB — TSH: TSH: 1.95 mIU/L (ref 0.40–4.50)

## 2020-08-04 ENCOUNTER — Encounter: Payer: Self-pay | Admitting: Family

## 2020-08-04 ENCOUNTER — Ambulatory Visit (INDEPENDENT_AMBULATORY_CARE_PROVIDER_SITE_OTHER): Payer: Medicare HMO | Admitting: Family

## 2020-08-04 ENCOUNTER — Other Ambulatory Visit: Payer: Self-pay

## 2020-08-04 VITALS — BP 146/64 | HR 70 | Temp 97.3°F | Resp 16 | Ht 65.5 in | Wt 151.2 lb

## 2020-08-04 DIAGNOSIS — E785 Hyperlipidemia, unspecified: Secondary | ICD-10-CM

## 2020-08-04 DIAGNOSIS — E1159 Type 2 diabetes mellitus with other circulatory complications: Secondary | ICD-10-CM

## 2020-08-04 DIAGNOSIS — E1169 Type 2 diabetes mellitus with other specified complication: Secondary | ICD-10-CM | POA: Diagnosis not present

## 2020-08-04 DIAGNOSIS — J302 Other seasonal allergic rhinitis: Secondary | ICD-10-CM

## 2020-08-04 DIAGNOSIS — K219 Gastro-esophageal reflux disease without esophagitis: Secondary | ICD-10-CM | POA: Diagnosis not present

## 2020-08-04 DIAGNOSIS — E1142 Type 2 diabetes mellitus with diabetic polyneuropathy: Secondary | ICD-10-CM

## 2020-08-04 DIAGNOSIS — G4700 Insomnia, unspecified: Secondary | ICD-10-CM | POA: Diagnosis not present

## 2020-08-04 DIAGNOSIS — I739 Peripheral vascular disease, unspecified: Secondary | ICD-10-CM | POA: Diagnosis not present

## 2020-08-04 DIAGNOSIS — K5901 Slow transit constipation: Secondary | ICD-10-CM

## 2020-08-04 DIAGNOSIS — I152 Hypertension secondary to endocrine disorders: Secondary | ICD-10-CM | POA: Diagnosis not present

## 2020-08-04 MED ORDER — AMLODIPINE BESYLATE 5 MG PO TABS: ORAL_TABLET | ORAL | 1 refills | Status: DC

## 2020-08-04 NOTE — Progress Notes (Signed)
Provider: [name]   Daphna Lafuente, [name], NP  Patient Care Team: Jeiry Birnbaum, [name], NP as PCP - General (Family Medicine) Dorothy Spark, MD as Consulting Physician (Cardiology) Clent Jacks, MD as Consulting Physician (Ophthalmology)  Extended Emergency Contact Information Primary Emergency Contact: [name],Mark Address: [place], [place] Phone: [phone] Relation: Son  Code Status: Full Code  Goals of care: Advanced Directive information Advanced Directives 08/04/2020  Does Patient Have a Medical Advance Directive? Yes  Type of Advance Directive Living will  Does patient want to make changes to medical advance directive? No - Patient declined  Copy of [place] in Chart? -     Chief Complaint  Patient presents with  . Medical Management of Chronic Issues    6 month follow up  . Health Maintenance    Discuss the need for Foot exam, and Urine microalbumin.  . Immunizations    Discuss the need for Covid Booster.    HPI:  Pt is a 75 y.o. female seen today for 6 months follow up for medical management of chronic diseases.Her recent lab results reviewed and discussed during visit. She complains of ongoing right knee pain.Has used topical analgesic and tylenol.would like to see Orthopedic but aware might need a knee replacement but would like to visit her grandchildren in a few weeks then will consider follow up when she returns. Reports no weight changes,fall episode or hospital admission since last visit.   Hypertension - reports home B/p readings in the 100's/50's - 120's/60 denies any headache,dizziness,vision changes,fatigue,chest tightness,palpitation,chest pain or shortness of breath. Currently on amlodipine 5 mg tablet daily.Has had some generalized edema on both legs.    Type 2 DM - Hgb A1C 6.9 previous 6.7 and glucose 126 follows up with podiatrist.has not been exercising much now days  due to narrow sneakers that she bought affecting her feet.Has small area bunion on her right foot.Awaiting podiatrist appointment to request some diabetic shoes.  She is due for urine microalbuminuria     Allergies - has worsen due to increased tree pollen.Saline spray,flonase and Claritin has been effective.   Constipation  - miralax has not been effective so has senna.Includes veggies and fruits in her diet.    Hyperlipidemia - chol 174,TRG 156,LDL 73  Has completed her COVID-19 including 1st booster.will request records from pharmacy.she did not bring her immunization to visit.    Past Medical History:  Diagnosis Date  . Abnormal Pap smear 05/08/2011   ASC-US +HPV  . Arthritis   . Asthma   . Constipation   . Diabetes mellitus type 2 with neurological manifestations ([place])   . Diabetic peripheral neuropathy ([place])   . Generalized osteoarthritis   . GERD (gastroesophageal reflux disease)   . Hx of seasonal allergies   . Hyperlipidemia   . Hypertension   . IBS (irritable bowel syndrome)   . S/P total hysterectomy 06/21/2011   Past Surgical History:  Procedure Laterality Date  . CATARACT EXTRACTION Left   . EYE SURGERY Right 01/2018   Removed raised area on eye. Dr.Groat   . OVARIAN CYST SURGERY  1986  . PELVIC LAPAROSCOPY    . TONSILECTOMY, ADENOIDECTOMY, BILATERAL MYRINGOTOMY AND TUBES  1974  . VAGINAL HYSTERECTOMY  1983    Allergies  Allergen Reactions  . Aspirin   . Pravastatin Sodium Other (See Comments)    Muscle aches  .  Restasis [Cyclosporine] Other (See Comments)    Allergies as of 08/04/2020      Reactions   Aspirin    Pravastatin Sodium Other (See Comments)   Muscle aches   Restasis [cyclosporine] Other (See Comments)      Medication List       Accurate as of August 04, 2020  1:09 PM. If you have any questions, ask your nurse or doctor.        Accu-Chek Aviva Plus test strip Generic drug: glucose blood USE TO TEST BLOOD SUGAR TWICE DAILY    Accu-Chek Aviva Plus w/Device Kit Use to test blood sugar twice daily. Dx E11.42   Accu-Chek Softclix Lancet Dev Kit Use to test blood sugar. Dx: E11.42   Accu-Chek Softclix Lancets lancets USE  TO TEST BLOOD SUGAR ONE TIME DAILY FOR DIABETES   Align 4 MG Caps Take 1 capsule by mouth daily.   amLODipine 5 MG tablet Commonly known as: NORVASC Take 1 tablet daily   cholecalciferol 1000 units tablet Commonly known as: VITAMIN D Take 1,000 Units by mouth daily.   Fish Oil 1000 MG Caps Take 2 capsules by mouth 2 (two) times a week.   fluticasone 50 MCG/ACT nasal spray Commonly known as: FLONASE Place 2 sprays into both nostrils as needed.   loratadine 10 MG tablet Commonly known as: CLARITIN Take 10 mg by mouth daily.   Lumigan 0.01 % Soln Generic drug: bimatoprost Place 1 drop into the right eye at bedtime.   metFORMIN 500 MG tablet Commonly known as: GLUCOPHAGE Take 2 tablets (1,000 mg total) by mouth daily with breakfast.   multivitamin-lutein Caps capsule Take 1 capsule by mouth daily.   multivitamin with minerals tablet Take 1 tablet by mouth daily.   polyethylene glycol 17 g packet Commonly known as: MIRALAX / GLYCOLAX Take 17 g by mouth daily as needed. For constipation   prednisoLONE acetate 1 % ophthalmic suspension Commonly known as: PRED FORTE Place 1 drop into the left eye in the morning, at noon, in the evening, and at bedtime.   rosuvastatin 5 MG tablet Commonly known as: CRESTOR Take 1 tablet (5 mg total) by mouth daily.   senna-docusate 8.6-50 MG tablet Commonly known as: Senokot-S Take 1 tablet by mouth at bedtime.   SYSTANE COMPLETE OP Place 1 drop into both eyes in the morning, at noon, in the evening, and at bedtime.   traZODone 50 MG tablet Commonly known as: DESYREL Take 1/2 to 1 tablet by mouth once daily as needed       Review of Systems  Constitutional: Negative for appetite change, chills, fatigue, fever and unexpected  weight change.  HENT: Negative for congestion, dental problem, ear discharge, ear pain, facial swelling, hearing loss, nosebleeds, postnasal drip, rhinorrhea, sinus pressure, sinus pain, sneezing, sore throat, tinnitus and trouble swallowing.        Stuffed up nose   Eyes: Positive for visual disturbance. Negative for pain, discharge, redness and itching.       Glaucoma follows up with Ophthalmology.  Respiratory: Negative for cough, chest tightness, shortness of breath and wheezing.   Cardiovascular: Negative for chest pain, palpitations and leg swelling.  Gastrointestinal: Positive for constipation. Negative for abdominal distention, abdominal pain, blood in stool, diarrhea, nausea and vomiting.       Acid reflux   Endocrine: Negative for cold intolerance, heat intolerance, polydipsia, polyphagia and polyuria.  Genitourinary: Negative for difficulty urinating, dysuria, flank pain, frequency and urgency.  Musculoskeletal: Positive for arthralgias.  Negative for back pain, gait problem, joint swelling, myalgias, neck pain and neck stiffness.       Right knee pain topical analgesic effect.would like to see Orthopedic after she goes to see Grandchildren   Skin: Negative for color change, pallor, rash and wound.  Neurological: Positive for numbness. Negative for dizziness, syncope, speech difficulty, weakness, light-headedness and headaches.       On hands and feet   Hematological: Does not bruise/bleed easily.  Psychiatric/Behavioral: Negative for agitation, behavioral problems, confusion, hallucinations, self-injury, sleep disturbance and suicidal ideas. The patient is not nervous/anxious.     Immunization History  Administered Date(s) Administered  . Fluad Quad(high Dose 65+) 02/11/2019, 02/12/2020  . Influenza, High Dose Seasonal PF 01/28/2018  . Influenza,inj,Quad PF,6+ Mos 02/03/2016, 01/23/2017  . Moderna Sars-Covid-2 Vaccination 06/22/2019, 07/24/2019  . Pneumococcal Conjugate-13  07/30/2014  . Pneumococcal Polysaccharide-23 11/17/2005, 05/20/2012  . Td 08/18/2010   Pertinent  Health Maintenance Due  Topic Date Due  . FOOT EXAM  01/23/2018  . URINE MICROALBUMIN  08/11/2020  . OPHTHALMOLOGY EXAM  10/26/2020  . INFLUENZA VACCINE  11/14/2020  . HEMOGLOBIN A1C  01/31/2021  . MAMMOGRAM  11/05/2021  . COLONOSCOPY (Pts 45-29yr Insurance coverage will need to be confirmed)  09/05/2025  . DEXA SCAN  Completed  . PNA vac Low Risk Adult  Completed   Fall Risk  08/04/2020 02/12/2020 12/25/2019 08/12/2019 03/04/2019  Falls in the past year? 0 0 0 0 0  Comment - - - Feels off balance most of the time, but no falls -  Number falls in past yr: 0 0 0 - 0  Injury with Fall? 0 0 0 - 0   Functional Status Survey:    Vitals:   08/04/20 1304  Height: 5' 5.5" (1.664 m)   Body mass index is 24.58 kg/m. Physical Exam Vitals reviewed.  Constitutional:      General: She is not in acute distress.    Appearance: Normal appearance. She is normal weight. She is not ill-appearing or diaphoretic.  HENT:     Head: Normocephalic.     Right Ear: Tympanic membrane, ear canal and external ear normal. There is no impacted cerumen.     Left Ear: Tympanic membrane, ear canal and external ear normal. There is no impacted cerumen.     Nose: Nose normal. No congestion or rhinorrhea.     Mouth/Throat:     Mouth: Mucous membranes are moist.     Pharynx: Oropharynx is clear. No oropharyngeal exudate or posterior oropharyngeal erythema.  Eyes:     General: No scleral icterus.       Right eye: No discharge.        Left eye: No discharge.     Extraocular Movements: Extraocular movements intact.     Conjunctiva/sclera: Conjunctivae normal.     Pupils: Pupils are equal, round, and reactive to light.  Neck:     Vascular: No carotid bruit.  Cardiovascular:     Rate and Rhythm: Normal rate and regular rhythm.     Pulses: Normal pulses.     Heart sounds: Normal heart sounds. No murmur  heard. No friction rub. No gallop.      Comments: Bilateral leg varicose veins to thigh areas.  Pulmonary:     Effort: Pulmonary effort is normal. No respiratory distress.     Breath sounds: Normal breath sounds. No wheezing, rhonchi or rales.  Chest:     Chest wall: No tenderness.  Abdominal:  General: Bowel sounds are normal. There is no distension.     Palpations: Abdomen is soft. There is no mass.     Tenderness: There is no abdominal tenderness. There is no right CVA tenderness, left CVA tenderness, guarding or rebound.  Musculoskeletal:        General: No swelling or tenderness.     Cervical back: Normal range of motion. No rigidity or tenderness.     Right knee: Crepitus present. No effusion, erythema or ecchymosis. Normal range of motion. No tenderness.     Left knee: Normal.     Comments: Bilateral lower extremities 1+ edema   Lymphadenopathy:     Cervical: No cervical adenopathy.  Skin:    General: Skin is warm and dry.     Coloration: Skin is not pale.     Findings: No bruising, erythema, lesion or rash.  Neurological:     Mental Status: She is alert and oriented to person, place, and time.     Cranial Nerves: No cranial nerve deficit.     Sensory: No sensory deficit.     Motor: No weakness.     Coordination: Coordination normal.     Gait: Gait normal.  Psychiatric:        Mood and Affect: Mood normal.        Speech: Speech normal.        Behavior: Behavior normal.        Thought Content: Thought content normal.        Judgment: Judgment normal.     Labs reviewed: Recent Labs    10/08/19 0751 02/08/20 0805 08/01/20 0807  NA 143 142 142  K 4.4 4.4 4.2  CL 103 105 104  CO2 _0 GLUCOSE 118* 132* 126*  BUN _1 CREATININE 0.99 0.98* 0.92  CALCIUM 9.5 9.7 9.4   Recent Labs    08/07/19 0801 02/08/20 0805 08/01/20 0807  AST _2 ALT _3 BILITOT 0.4 0.5 0.5  PROT 6.5 7.2 7.3   Recent Labs    08/07/19 0801 02/08/20 0805  08/01/20 0807  WBC 5.5 5.3 5.0  NEUTROABS 1,837 1,685 1,900  HGB 13.3 14.3 14.5  HCT 39.9 43.0 43.8  MCV 85.4 86.9 86.2  PLT 186 185 173   Lab Results  Component Value Date   TSH 1.95 08/01/2020   Lab Results  Component Value Date   HGBA1C 6.9 (H) 08/01/2020   Lab Results  Component Value Date   CHOL 174 08/01/2020   HDL 78 08/01/2020   LDLCALC 73 08/01/2020   TRIG 156 (H) 08/01/2020   CHOLHDL 2.2 08/01/2020    Significant Diagnostic Results in last 30 days:  No results found.  Assessment/Plan 1. Type 2 diabetes mellitus with diabetic polyneuropathy, without long-term current use of insulin ([facility]) Lab Results  Component Value Date   HGBA1C 6.9 (H) 08/01/2020  - continue on metformin  - continue on statin  - Hemoglobin A1c; Future - Microalbumin / creatinine urine ratio - then will add low dose of ACE inhibitor or ARB  2. Hypertension associated with diabetes ([place]) Soft blood pressure readings. Advised to reduce Amlodipine from 5 mg tablet to 2.5  - CBC with Differential/Platelet; Future - CMP with eGFR([facility]); Future - TSH; Future - amLODipine (NORVASC) 5 MG tablet; Take 0.5 tablet daily  Dispense: 90 tablet; Refill: 1  3. Hyperlipidemia associated with type 2 diabetes mellitus ([place]) LDL at goal  - continue on  rosuvastatin  - Lipid panel; Future  4. Gastroesophageal reflux disease without esophagitis Symptoms stable  Reports no signs of GI bleed  H/H stable   5. Seasonal allergies Continue on Saline spray,loratadine and flonase.   6. Slow transit constipation Continue on miralax and senna  - encouraged to increase fiber in diet  - increase water intake to 6-8 glasses of water daily and exercise as tolerated   7. Insomnia, unspecified type Continue on Trazodone   8. Claudication [facility]) Not walking much now days but still has some cramping of legs when she walks which improves with rest. Advised to weak thigh high compression stockings on in the  morning and off at bedtime.  - Compression stockings  Family/ staff Communication: Reviewed plan of care with patient verbalized understanding  Labs/tests ordered:  - CBC with Differential/Platelet; Future - CMP with eGFR([facility]); Future - TSH; Future - Hemoglobin A1c; Future - Microalbumin / creatinine urine ratio  Next Appointment : 6 months for medical management of chronic issues.Fasting Labs prior to visit.    [name], NP

## 2020-08-04 NOTE — Patient Instructions (Signed)
-   Wear Thigh high compression stockings on in the morning and off at bedtime.  - Reduce Amlodipine from 5 mg tablet to 2.5 mg tablet one by mouth daily

## 2020-08-05 ENCOUNTER — Telehealth: Payer: Self-pay

## 2020-08-05 LAB — MICROALBUMIN / CREATININE URINE RATIO
Creatinine, Urine: 27 mg/dL (ref 20–275)
Microalb, Ur: <0.2 mg/dL

## 2020-08-05 MED ORDER — LISINOPRIL 2.5 MG PO TABS: 2.5000 mg | ORAL_TABLET | Freq: Every day | ORAL | 3 refills | Status: DC

## 2020-08-05 NOTE — Telephone Encounter (Signed)
-----   Message from [name], NP sent at 08/05/2020  9:30 AM EDT ----- Urine micro Albumin is normal start on Lisinopril 2.5 mg tablet one by mouth daily for kidney protection due to Type 2 diabetes. Monitor B/p daily and notify provide if B/p < 100/60

## 2020-08-05 NOTE — Telephone Encounter (Signed)
Discussed results with patient, patient verbalized understanding of results. RX sent to mail order pharmacy per patient request

## 2020-08-08 ENCOUNTER — Other Ambulatory Visit: Payer: Medicare HMO

## 2020-08-23 ENCOUNTER — Other Ambulatory Visit: Payer: Self-pay

## 2020-08-23 ENCOUNTER — Ambulatory Visit
Admission: RE | Admit: 2020-08-23 | Discharge: 2020-08-23 | Disposition: A | Discharge status: Home / Self Care | Payer: Medicare HMO | Source: Ambulatory Visit | Attending: Family | Admitting: Family

## 2020-08-23 DIAGNOSIS — Z780 Asymptomatic menopausal state: Secondary | ICD-10-CM

## 2020-08-23 DIAGNOSIS — M85851 Other specified disorders of bone density and structure, right thigh: Secondary | ICD-10-CM | POA: Diagnosis not present

## 2020-08-25 ENCOUNTER — Telehealth: Payer: Self-pay | Admitting: *Deleted

## 2020-08-25 NOTE — Telephone Encounter (Signed)
Patient called and stated that her blood pressure has been running low since 07/25/2020 (103/50). Stated that when she wakes up it is in the 90's/50's. She waits alittle while and it will come up into the 120's/60's.   Today she took it and it was 90/51 and she waited 2 hours and re took it and it only came up to 99/50.  Patient is wondering if she needs to hold her blood pressure medications.  Currently taking: -Lisinopril 2.5 daily -Amlodipine 5mg   1/2 daily  Please Advise. (forwarded to [name] due to [name] out of office)

## 2020-08-25 NOTE — Telephone Encounter (Signed)
Patient notified and agreed. Medication list updated.  

## 2020-08-25 NOTE — Telephone Encounter (Signed)
Yes lets have her stop norvasc (amlodipine) and continue lisinipril (since she has diabetes this helps protect her kidneys) have her continue to monitor her blood pressure and make sure she is drinking enough water.

## 2020-08-30 ENCOUNTER — Ambulatory Visit: Payer: Medicare HMO | Admitting: Family

## 2020-08-31 ENCOUNTER — Other Ambulatory Visit: Payer: Self-pay | Admitting: Family

## 2020-08-31 DIAGNOSIS — E1142 Type 2 diabetes mellitus with diabetic polyneuropathy: Secondary | ICD-10-CM

## 2020-09-20 ENCOUNTER — Encounter (INDEPENDENT_AMBULATORY_CARE_PROVIDER_SITE_OTHER): Payer: Medicare HMO | Admitting: Ophthalmology

## 2020-11-07 ENCOUNTER — Ambulatory Visit (INDEPENDENT_AMBULATORY_CARE_PROVIDER_SITE_OTHER): Payer: Medicare HMO

## 2020-11-07 ENCOUNTER — Encounter (HOSPITAL_COMMUNITY): Payer: Self-pay

## 2020-11-07 ENCOUNTER — Ambulatory Visit (HOSPITAL_COMMUNITY)
Admission: EM | Admit: 2020-11-07 | Discharge: 2020-11-07 | Disposition: A | Discharge status: Home / Self Care | Payer: Medicare HMO | Attending: Student | Admitting: Student

## 2020-11-07 ENCOUNTER — Other Ambulatory Visit: Payer: Self-pay

## 2020-11-07 DIAGNOSIS — R0602 Shortness of breath: Secondary | ICD-10-CM

## 2020-11-07 DIAGNOSIS — U071 COVID-19: Secondary | ICD-10-CM

## 2020-11-07 DIAGNOSIS — J0110 Acute frontal sinusitis, unspecified: Secondary | ICD-10-CM

## 2020-11-07 MED ORDER — PREDNISONE 20 MG PO TABS: 40.0000 mg | ORAL_TABLET | Freq: Every day | ORAL | 0 refills | Status: AC

## 2020-11-07 MED ORDER — AMOXICILLIN 875 MG PO TABS: 875.0000 mg | ORAL_TABLET | Freq: Two times a day (BID) | ORAL | 0 refills | Status: AC

## 2020-11-07 NOTE — ED Provider Notes (Signed)
[place]    CSN: [id number] Arrival date & time: 11/07/20  1016      History   Chief Complaint Chief Complaint  Patient presents with   Covid +    HPI Sierra Rome V. is a 75 y.o. female presenting with continued cough following testing positive for COVID19. Medical history arthritis, asthma, diabetes type 2, GERD, allergic rhinitis.  States that she first developed symptoms about 2 weeks ago, she had a COVID test after 7 days of symptoms and this was positive.  She states that her symptoms overall improved, but now she is having facial pressure with nasal congestion and hacking cough productive of white sputum.  Denies any chest pain, dizziness, weakness.  Shortness of breath during coughing episodes and due to the nasal congestion. Denies fevers/chills, n/v/d, shortness of breath, chest pain, cough, congestion, facial pain, teeth pain, headaches, sore throat, loss of taste/smell, swollen lymph nodes, ear pain.    HPI  Past Medical History:  Diagnosis Date   Abnormal Pap smear 05/08/2011   ASC-US +HPV   Arthritis    Asthma    Constipation    Diabetes mellitus type 2 with neurological manifestations ([place])    Diabetic peripheral neuropathy (H[facility])    Generalized osteoarthritis    GERD (gastroesophageal reflux disease)    Hx of seasonal allergies    Hyperlipidemia    Hypertension    IBS (irritable bowel syndrome)    S/P total hysterectomy 06/21/2011    Patient Active Problem List   Diagnosis Date Noted   Claudication ([place]) 08/04/2020   Vitreous floaters, left 04/19/2020   Posterior vitreous detachment, left eye 04/19/2020   Vitreomacular traction, left 03/22/2020   Posterior vitreous detachment of right eye 03/22/2020   Nuclear sclerotic cataract of right eye 03/22/2020   Chronic pain of both knees 08/12/2019   Unsteady gait 08/12/2019   Slow transit constipation 08/12/2019   High risk medication use 07/24/2017   Depression 01/23/2017    Costochondritis 07/25/2016   Varicose veins of both lower extremities with complications 68/03/7516   Short-term memory loss 02/03/2016   Pain in joint, multiple sites 02/03/2016   Chronic seasonal allergic rhinitis due to pollen 02/03/2016   Gastroesophageal reflux disease without esophagitis 07/03/2015   Type 2 diabetes mellitus with diabetic polyneuropathy, without long-term current use of insulin ([place]) 07/03/2015   Hyperlipidemia LDL goal <100 07/03/2015   Benign essential HTN 07/03/2015   Essential hypertension 01/15/2014   Palpitations 01/15/2014   Hyperlipidemia 01/15/2014   DOE (dyspnea on exertion) 01/15/2014   PAC (premature atrial contraction) 01/15/2014   S/P total hysterectomy 06/21/2011    Past Surgical History:  Procedure Laterality Date   CATARACT EXTRACTION Left    EYE SURGERY Right 01/2018   Removed raised area on eye. Dr.Groat    OVARIAN CYST SURGERY  1986   PELVIC LAPAROSCOPY     TONSILECTOMY, ADENOIDECTOMY, BILATERAL MYRINGOTOMY AND TUBES  1974   VAGINAL HYSTERECTOMY  1983    OB History     Gravida  5   Para  4   Term  4   Preterm      AB  1   Living  4      SAB  1   IAB      Ectopic      Multiple      Live Births           Obstetric Comments  I adopted child  Home Medications    Prior to Admission medications   Medication Sig Start Date End Date Taking? Authorizing Provider  amoxicillin (AMOXIL) 875 MG tablet Take 1 tablet (875 mg total) by mouth 2 (two) times daily for 7 days. 11/07/20 11/14/20 Yes Hazel Sams, PA-C  predniSONE (DELTASONE) 20 MG tablet Take 2 tablets (40 mg total) by mouth daily for 5 days. 11/07/20 11/12/20 Yes Hazel Sams, PA-C  ACCU-CHEK AVIVA PLUS test strip USE TO TEST BLOOD SUGAR TWICE DAILY 08/31/20   [name], [name] C, NP  Accu-Chek Softclix Lancets lancets USE  TO TEST BLOOD SUGAR ONE TIME DAILY FOR DIABETES 08/12/19   [name], [name] C, NP  Blood Glucose Monitoring Suppl (ACCU-CHEK AVIVA  PLUS) w/Device KIT Use to test blood sugar twice daily. Dx E11.42 05/22/17   Gildardo Cranker, DO  cholecalciferol (VITAMIN D) 1000 UNITS tablet Take 1,000 Units by mouth daily.    [provider]  fluticasone (FLONASE) 50 MCG/ACT nasal spray Place 2 sprays into both nostrils as needed.     [provider]  Lancets Misc. (ACCU-CHEK SOFTCLIX LANCET DEV) KIT Use to test blood sugar. Dx: E11.42 12/10/19   [name], [name] C, NP  lisinopril (ZESTRIL) 2.5 MG tablet Take 1 tablet (2.5 mg total) by mouth daily. 08/05/20   [name], [name] C, NP  loratadine (CLARITIN) 10 MG tablet Take 10 mg by mouth daily.    [provider]  LUMIGAN 0.01 % SOLN Place 1 drop into the right eye at bedtime. 01/04/20   [provider]  metFORMIN (GLUCOPHAGE) 500 MG tablet Take 2 tablets (1,000 mg total) by mouth daily with breakfast. 02/15/20   [name], [name] C, NP  Multiple Vitamins-Minerals (MULTIVITAMIN WITH MINERALS) tablet Take 1 tablet by mouth daily.    [provider]  multivitamin-lutein (OCUVITE-LUTEIN) CAPS capsule Take 1 capsule by mouth daily.    [provider]  Omega-3 Fatty Acids (FISH OIL) 1000 MG CAPS Take 2 capsules by mouth 2 (two) times a week.     [provider]  polyethylene glycol (MIRALAX / GLYCOLAX) packet Take 17 g by mouth daily as needed. For constipation    [provider]  prednisoLONE acetate (PRED FORTE) 1 % ophthalmic suspension Place 1 drop into the left eye in the morning, at noon, in the evening, and at bedtime. 12/24/19   [provider]  Probiotic Product (ALIGN) 4 MG CAPS Take 1 capsule by mouth daily.     [provider]  Propylene Glycol (SYSTANE COMPLETE OP) Place 1 drop into both eyes in the morning, at noon, in the evening, and at bedtime.    [provider]  rosuvastatin (CRESTOR) 5 MG tablet Take 1 tablet (5 mg total) by mouth daily. 02/15/20   [name], [name] C, NP  senna-docusate (SENOKOT-S)  8.6-50 MG tablet Take 1 tablet by mouth at bedtime. 08/12/19   [name], [name] C, NP  traZODone (DESYREL) 50 MG tablet Take 1/2 to 1 tablet by mouth once daily as needed    [provider]    Family History Family History  Problem Relation Age of Onset   Diabetes Father    Heart disease Father    Diabetes Mother    Cancer Mother        colon    Pancreatic cancer Brother    Pancreatic cancer Sister    Cancer Paternal Grandmother        liver    Breast cancer Cousin    Breast cancer  Maternal Aunt     Social History Social History   Tobacco Use   Smoking status: Never   Smokeless tobacco: Never  Vaping Use   Vaping Use: Never used  Substance Use Topics   Alcohol use: No   Drug use: No     Allergies   Aspirin, Pravastatin sodium, and Restasis [cyclosporine]   Review of Systems Review of Systems  Constitutional:  Negative for appetite change, chills and fever.  HENT:  Positive for congestion and sinus pressure. Negative for ear pain, rhinorrhea, sinus pain and sore throat.   Eyes:  Negative for redness and visual disturbance.  Respiratory:  Positive for cough. Negative for chest tightness, shortness of breath and wheezing.   Cardiovascular:  Negative for chest pain and palpitations.  Gastrointestinal:  Negative for abdominal pain, constipation, diarrhea, nausea and vomiting.  Genitourinary:  Negative for dysuria, frequency and urgency.  Musculoskeletal:  Negative for myalgias.  Neurological:  Negative for dizziness, weakness and headaches.  Psychiatric/Behavioral:  Negative for confusion.   All other systems reviewed and are negative.   Physical Exam Triage Vital Signs ED Triage Vitals  Enc Vitals Group     BP 11/07/20 1259 (!) 164/79     Pulse Rate 11/07/20 1259 71     Resp 11/07/20 1259 17     Temp 11/07/20 1259 97.8 F (36.6 C)     Temp Source 11/07/20 1259 Oral     SpO2 11/07/20 1259 98 %     Weight --      Height --      Head Circumference --       Peak Flow --      Pain Score 11/07/20 1302 3     Pain Loc --      Pain Edu? --      Excl. in [place]? --    No data found.  Updated Vital Signs BP (!) 164/79 (BP Location: Right Arm)   Pulse 71   Temp 97.8 F (36.6 C) (Oral)   Resp 17   SpO2 98%   Visual Acuity Right Eye Distance:   Left Eye Distance:   Bilateral Distance:    Right Eye Near:   Left Eye Near:    Bilateral Near:     Physical Exam Vitals reviewed.  Constitutional:      General: She is not in acute distress.    Appearance: Normal appearance. She is not ill-appearing.  HENT:     Head: Normocephalic and atraumatic.     Right Ear: Tympanic membrane, ear canal and external ear normal. No tenderness. No middle ear effusion. There is no impacted cerumen. Tympanic membrane is not perforated, erythematous, retracted or bulging.     Left Ear: Tympanic membrane, ear canal and external ear normal. No tenderness.  No middle ear effusion. There is no impacted cerumen. Tympanic membrane is not perforated, erythematous, retracted or bulging.     Nose: No congestion.     Right Sinus: Frontal sinus tenderness present.     Left Sinus: Frontal sinus tenderness present.     Mouth/Throat:     Mouth: Mucous membranes are moist.     Pharynx: Uvula midline. No oropharyngeal exudate or posterior oropharyngeal erythema.  Eyes:     Extraocular Movements: Extraocular movements intact.     Pupils: Pupils are equal, round, and reactive to light.  Cardiovascular:     Rate and Rhythm: Normal rate and regular rhythm.     Heart sounds: Normal heart sounds.  Pulmonary:  Effort: Pulmonary effort is normal.     Breath sounds: Normal breath sounds. No decreased breath sounds, wheezing, rhonchi or rales.     Comments: Frequent hacking cough Abdominal:     Palpations: Abdomen is soft.     Tenderness: There is no abdominal tenderness. There is no guarding or rebound.  Neurological:     General: No focal deficit present.     Mental  Status: She is alert and oriented to person, place, and time.  Psychiatric:        Mood and Affect: Mood normal.        Behavior: Behavior normal.        Thought Content: Thought content normal.        Judgment: Judgment normal.     UC Treatments / Results  Labs (all labs ordered are listed, but only abnormal results are displayed) Labs Reviewed - No data to display  EKG   Radiology DG Chest 2 View  Result Date: 11/07/2020 CLINICAL DATA:  Shortness of breath.  COVID 1 week ago. EXAM: CHEST - 2 VIEW COMPARISON:  04/28/2009. FINDINGS: Mild enlargement the cardiac silhouette. Both lungs are clear. No visible pleural effusions or pneumothorax. No acute osseous abnormality. Polyarticular degenerative change. IMPRESSION: 1. No evidence of active cardiopulmonary disease. 2. Mild cardiomegaly. Electronically Signed   By: Margaretha Sheffield MD   On: 11/07/2020 13:27    Procedures Procedures (including critical care time)  Medications Ordered in UC Medications - No data to display  Initial Impression / Assessment and Plan / UC Course  I have reviewed the triage vital signs and the nursing notes.  Pertinent labs & imaging results that were available during my care of the patient were reviewed by me and considered in my medical decision making (see chart for details).     This patient is a very pleasant 75 y.o. year old female presenting with acute sinusitis and acute bronchitis following viral URI/covid. Today this pt is afebrile nontachycardic nontachypneic, oxygenating well on room air, no wheezes rhonchi or rales.    Tested positive for covid 10/31/20; she is out of paxlovid/antiviral window. A1c 6.9 08/01/20 CXR- no cardiopulmonary disease  EKG unchanged from 2021 EKG  Prednisone and amoxicillin as below.  ED return precautions discussed. Patient verbalizes understanding and agreement.   Coding this visit a Level 4 for acute illness with systemic symptoms and prescription drug  management. Also review of past notes and labs and order and interpretation of labs today  Final Clinical Impressions(s) / UC Diagnoses   Final diagnoses:  Acute non-recurrent frontal sinusitis  COVID-19     Discharge Instructions      -Start the antibiotic-Amoxicillin, 1 pill every 12 hours for 7 days.  You can take this with food like with breakfast and dinner. -Prednisone, 2 pills taken at the same time for 5 days in a row.  Try taking this earlier in the day as it can give you energy. Avoid NSAIDs like ibuprofen and alleve while taking this medication as they can increase your risk of stomach upset and even GI bleeding when in combination with a steroid. You can continue tylenol (acetaminophen) up to 1084m 3x daily. -Seek additional medical attention if symptoms worsen instead of improve (shortness of breath, chest pain, dizziness)     ED Prescriptions     Medication Sig Dispense Auth. Provider   amoxicillin (AMOXIL) 875 MG tablet Take 1 tablet (875 mg total) by mouth 2 (two) times daily for 7 days. [place]  tablet Hazel Sams, PA-C   predniSONE (DELTASONE) 20 MG tablet Take 2 tablets (40 mg total) by mouth daily for 5 days. 10 tablet Hazel Sams, PA-C      PDMP not reviewed this encounter.   Hazel Sams, PA-C 11/07/20 1441

## 2020-11-07 NOTE — ED Triage Notes (Signed)
Pt presents covid + AS OF Monday 10/31/2020; Pt states she is still having ongoing cough and severe congestion.

## 2020-11-07 NOTE — Discharge Instructions (Addendum)
-  Start the antibiotic-Amoxicillin, 1 pill every 12 hours for 7 days.  You can take this with food like with breakfast and dinner. -Prednisone, 2 pills taken at the same time for 5 days in a row.  Try taking this earlier in the day as it can give you energy. Avoid NSAIDs like ibuprofen and alleve while taking this medication as they can increase your risk of stomach upset and even GI bleeding when in combination with a steroid. You can continue tylenol (acetaminophen) up to '1000mg'$  3x daily. -Seek additional medical attention if symptoms worsen instead of improve (shortness of breath, chest pain, dizziness)

## 2020-11-14 ENCOUNTER — Other Ambulatory Visit: Payer: Self-pay | Admitting: Family

## 2020-11-14 DIAGNOSIS — E1142 Type 2 diabetes mellitus with diabetic polyneuropathy: Secondary | ICD-10-CM

## 2020-11-14 DIAGNOSIS — E785 Hyperlipidemia, unspecified: Secondary | ICD-10-CM

## 2020-11-14 DIAGNOSIS — E1169 Type 2 diabetes mellitus with other specified complication: Secondary | ICD-10-CM

## 2020-11-14 NOTE — Telephone Encounter (Signed)
Patient has request refill on medications "Metformin '500mg'$ ", and "Crestor '5mg'$ ". Patient last refill on both medications was 02/15/2020. Patient medications have warnings and allergy contraindications. Medications pend and sent to PCP [name], [name], NP . Please Advise.

## 2020-11-15 ENCOUNTER — Other Ambulatory Visit: Payer: Self-pay | Admitting: *Deleted

## 2020-11-15 MED ORDER — METOPROLOL TARTRATE 25 MG PO TABS: 25.0000 mg | ORAL_TABLET | ORAL | 0 refills | Status: DC | PRN

## 2020-11-16 ENCOUNTER — Other Ambulatory Visit: Payer: Self-pay | Admitting: *Deleted

## 2020-11-16 MED ORDER — METOPROLOL TARTRATE 25 MG PO TABS: 25.0000 mg | ORAL_TABLET | ORAL | 0 refills | Status: DC | PRN

## 2020-11-23 ENCOUNTER — Telehealth: Payer: Self-pay | Admitting: Cardiology

## 2020-11-23 MED ORDER — METOPROLOL TARTRATE 25 MG PO TABS: 12.5000 mg | ORAL_TABLET | Freq: Two times a day (BID) | ORAL | 1 refills | Status: DC | PRN

## 2020-11-23 NOTE — Telephone Encounter (Signed)
   Pt c/o medication issue:  1. Name of Medication: metoprolol tartrate (LOPRESSOR) 25 MG tablet  2. How are you currently taking this medication (dosage and times per day)? Take 1 tablet (25 mg total) by mouth as needed (for palpations).  3. Are you having a reaction (difficulty breathing--STAT)?   4. What is your medication issue? Charmaine with [facility] pharmacy called need to clarify dosage of this medication ref# [id number]

## 2020-11-23 NOTE — Telephone Encounter (Signed)
Called the pt to confirm how she is taking her metoprolol tartrate, for her last OV with Dr. [name], she was instructed that she could wean taking this medication at 12.5 mg po bid as needed for palpitations. Confirmed with the pt if she is taking her metoprolol as Dr. [name] advised, and she said yes, she is taking metoprolol tartrate 12.5 mg po bid as needed for palpitations.  Will call her pharmacy now and provided them this clarification so they can refill this for the pt, for when our office refilled on 8/3, incorrect instructions were sent to them. Also went ahead and made the pt an appt to establish and see Dr. Johney Frame for early Jan.  Pt requested 04/21/21 at 0840 see Dr. Johney Frame.  She wanted this date due to insurance coverage.  She states she will see her PCP in the interim, and follow-up with as needed in the meantime, until that appt. Pt verbalized understanding and agrees with this plan.   Called the pts pharmacy and spoke with Charmaine in Pharmacy to provide her clarification order on the pts metoprolol tartrate, for 12.5 mg po bid as needed for palpitations. Pharmacist verbalized understanding of clarification order, and was gracious for all the assistance provided.

## 2020-12-06 ENCOUNTER — Ambulatory Visit (INDEPENDENT_AMBULATORY_CARE_PROVIDER_SITE_OTHER): Payer: Medicare HMO | Admitting: Family

## 2020-12-06 ENCOUNTER — Encounter: Payer: Self-pay | Admitting: Family

## 2020-12-06 ENCOUNTER — Other Ambulatory Visit: Payer: Self-pay

## 2020-12-06 VITALS — BP 138/80 | HR 70 | Temp 96.9°F | Resp 16 | Ht 65.5 in

## 2020-12-06 DIAGNOSIS — Z20822 Contact with and (suspected) exposure to covid-19: Secondary | ICD-10-CM

## 2020-12-06 DIAGNOSIS — R002 Palpitations: Secondary | ICD-10-CM

## 2020-12-06 DIAGNOSIS — Z1211 Encounter for screening for malignant neoplasm of colon: Secondary | ICD-10-CM

## 2020-12-06 DIAGNOSIS — Z23 Encounter for immunization: Secondary | ICD-10-CM

## 2020-12-06 DIAGNOSIS — R5383 Other fatigue: Secondary | ICD-10-CM

## 2020-12-06 DIAGNOSIS — E1142 Type 2 diabetes mellitus with diabetic polyneuropathy: Secondary | ICD-10-CM | POA: Diagnosis not present

## 2020-12-06 MED ORDER — TETANUS-DIPHTH-ACELL PERTUSSIS 5-2.5-18.5 LF-MCG/0.5 IM SUSP: 0.5000 mL | Freq: Once | INTRAMUSCULAR | 0 refills | Status: AC

## 2020-12-06 NOTE — Progress Notes (Signed)
Provider: [name]  Renn Stille, [name], NP  Patient Care Team: Asheton Scheffler, [name], NP as PCP - General (Family Medicine) Dorothy Spark, MD (Inactive) as Consulting Physician (Cardiology) Clent Jacks, MD as Consulting Physician (Ophthalmology)  Extended Emergency Contact Information Primary Emergency Contact: [name],Mark Address: [address], [place] Phone: [phone] Relation: Son  Code Status: Full Code  Goals of care: Advanced Directive information Advanced Directives 12/06/2020  Does Patient Have a Medical Advance Directive? Yes  Type of Advance Directive Living will  Does patient want to make changes to medical advance directive? No - Patient declined  Copy of [place] in Chart? -     Chief Complaint  Patient presents with   Acute Visit    Complains of fatigue, and weakness is chest.    HPI:  Pt is a 74 y.o. female seen today for an acute visit for evaluation of fatigue  Woke up on Saturday with feeling weird on the chest for a short period of time. Chronic fatigue is ongoing.Latest TSH level was within normal range.Has to catch her breath.has hx of Asthma.   States a friend had Covid but was with her after she completed her quarantine days.she was wearing her mask.Home kit test was negative today.she had COVID-19 in July,20222.  She denies any fever,chills,cough,,body aches,runny nose,chest tightness,chest pain or shortness of breath.  She would like referral to GI for colonoscopy request referral to [facility] # [phone]   Also request another podiatrist states checked with her Insurance already would like to see Dr. Allena Katz tel # [phone]    Would also like to follow up with cardiologist Dr.Heather Johney Frame tel # [phone].states might be on leave but willing to see another provider in the practice.   Dr.Groat for her eye exam.     Past Medical  History:  Diagnosis Date   Abnormal Pap smear 05/08/2011   ASC-US +HPV   Arthritis    Asthma    Constipation    Diabetes mellitus type 2 with neurological manifestations (HCC)    Diabetic peripheral neuropathy (HCC)    Generalized osteoarthritis    GERD (gastroesophageal reflux disease)    Hx of seasonal allergies    Hyperlipidemia    Hypertension    IBS (irritable bowel syndrome)    S/P total hysterectomy 06/21/2011   Past Surgical History:  Procedure Laterality Date   CATARACT EXTRACTION Left    EYE SURGERY Right 01/2018   Removed raised area on eye. Dr.Groat    OVARIAN CYST SURGERY  1986   PELVIC LAPAROSCOPY     TONSILECTOMY, ADENOIDECTOMY, BILATERAL MYRINGOTOMY AND TUBES  1974   VAGINAL HYSTERECTOMY  1983    Allergies  Allergen Reactions   Aspirin    Pravastatin Sodium Other (See Comments)    Muscle aches   Restasis [Cyclosporine] Other (See Comments)    Outpatient Encounter Medications as of 12/06/2020  Medication Sig   ACCU-CHEK AVIVA PLUS test strip USE TO TEST BLOOD SUGAR TWICE DAILY   Accu-Chek Softclix Lancets lancets USE  TO TEST BLOOD SUGAR ONE TIME DAILY FOR DIABETES   Blood Glucose Monitoring Suppl (ACCU-CHEK AVIVA PLUS) w/Device KIT Use to test blood sugar twice daily. Dx E11.42   cholecalciferol (VITAMIN D) 1000 UNITS tablet Take 1,000 Units by mouth daily.   fluticasone (FLONASE) 50 MCG/ACT nasal spray Place 2  sprays into both nostrils as needed.    Lancets Misc. (ACCU-CHEK SOFTCLIX LANCET DEV) KIT Use to test blood sugar. Dx: E11.42   lisinopril (ZESTRIL) 2.5 MG tablet Take 1 tablet (2.5 mg total) by mouth daily.   loratadine (CLARITIN) 10 MG tablet Take 10 mg by mouth daily as needed.   LUMIGAN 0.01 % SOLN Place 1 drop into the right eye at bedtime.   metFORMIN (GLUCOPHAGE) 500 MG tablet TAKE 2 TABLETS EVERY DAY WITH BREAKFAST   metoprolol tartrate (LOPRESSOR) 25 MG tablet Take 0.5 tablets (12.5 mg total) by mouth 2 (two) times daily as needed  (palpitations).   Multiple Vitamins-Minerals (MULTIVITAMIN WITH MINERALS) tablet Take 1 tablet by mouth daily.   multivitamin-lutein (OCUVITE-LUTEIN) CAPS capsule Take 1 capsule by mouth daily.   Omega-3 Fatty Acids (FISH OIL) 1000 MG CAPS Take 2 capsules by mouth 2 (two) times a week.    polyethylene glycol (MIRALAX / GLYCOLAX) packet Take 17 g by mouth daily as needed. For constipation   prednisoLONE acetate (PRED FORTE) 1 % ophthalmic suspension Place 1 drop into the left eye in the morning, at noon, in the evening, and at bedtime.   Probiotic Product (ALIGN) 4 MG CAPS Take 1 capsule by mouth daily.    Propylene Glycol (SYSTANE COMPLETE OP) Place 1 drop into both eyes in the morning, at noon, in the evening, and at bedtime.   rosuvastatin (CRESTOR) 5 MG tablet TAKE 1 TABLET EVERY DAY   senna-docusate (SENOKOT-S) 8.6-50 MG tablet Take 1 tablet by mouth at bedtime.   traZODone (DESYREL) 50 MG tablet Take 1/2 to 1 tablet by mouth once daily as needed   No facility-administered encounter medications on file as of 12/06/2020.    Review of Systems  Constitutional:  Positive for fatigue. Negative for appetite change, chills, fever and unexpected weight change.  HENT:  Negative for congestion, dental problem, ear discharge, ear pain, facial swelling, hearing loss, nosebleeds, postnasal drip, rhinorrhea, sinus pressure, sinus pain, sneezing, sore throat, tinnitus and trouble swallowing.   Eyes:  Negative for pain, discharge, redness, itching and visual disturbance.  Respiratory:  Negative for cough, chest tightness, shortness of breath and wheezing.   Cardiovascular:  Negative for chest pain and leg swelling.       Occasional palpitation   Gastrointestinal:  Negative for abdominal distention, abdominal pain, blood in stool, constipation, diarrhea, nausea and vomiting.  Endocrine: Negative for cold intolerance, heat intolerance, polydipsia, polyphagia and polyuria.  Genitourinary:  Negative for  difficulty urinating, dysuria, flank pain, frequency and urgency.  Musculoskeletal:  Negative for arthralgias, back pain, gait problem, joint swelling, myalgias, neck pain and neck stiffness.  Skin:  Negative for color change, pallor, rash and wound.  Neurological:  Negative for dizziness, syncope, speech difficulty, weakness, light-headedness, numbness and headaches.  Hematological:  Does not bruise/bleed easily.  Psychiatric/Behavioral:  Negative for agitation, behavioral problems, confusion, hallucinations, self-injury, sleep disturbance and suicidal ideas. The patient is not nervous/anxious.    Immunization History  Administered Date(s) Administered   Fluad Quad(high Dose 65+) 02/11/2019, 02/12/2020   Influenza, High Dose Seasonal PF 01/28/2018   Influenza,inj,Quad PF,6+ Mos 02/03/2016, 01/23/2017   Moderna Sars-Covid-2 Vaccination 06/22/2019, 07/24/2019   Pneumococcal Conjugate-13 07/30/2014   Pneumococcal Polysaccharide-23 11/17/2005, 05/20/2012   Td 08/18/2010   Pertinent  Health Maintenance Due  Topic Date Due   FOOT EXAM  01/23/2018   OPHTHALMOLOGY EXAM  10/26/2020   INFLUENZA VACCINE  11/14/2020   HEMOGLOBIN A1C  01/31/2021   MAMMOGRAM  11/05/2021   COLONOSCOPY (Pts 45-18yr Insurance coverage will need to be confirmed)  09/05/2025   DEXA SCAN  Completed   PNA vac Low Risk Adult  Completed   Fall Risk  12/06/2020 08/04/2020 02/12/2020 12/25/2019 08/12/2019  Falls in the past year? 0 0 0 0 0  Comment - - - - Feels off balance most of the time, but no falls  Number falls in past yr: 0 0 0 0 -  Injury with Fall? 0 0 0 0 -  Risk for fall due to : No Fall Risks - - - -  Follow up Falls evaluation completed - - - -   Functional Status Survey:    Vitals:   12/06/20 1007  BP: 138/80  Pulse: 70  Resp: 16  Temp: (!) 96.9 F (36.1 C)  SpO2: 97%  Height: 5' 5.5" (1.664 m)   Body mass index is 24.78 kg/m. Physical Exam Vitals reviewed.  Constitutional:      General: She  is not in acute distress.    Appearance: Normal appearance. She is normal weight. She is not ill-appearing or diaphoretic.  HENT:     Head: Normocephalic.     Nose: Nose normal. No congestion or rhinorrhea.     Mouth/Throat:     Mouth: Mucous membranes are moist.     Pharynx: Oropharynx is clear. No oropharyngeal exudate or posterior oropharyngeal erythema.  Eyes:     General: No scleral icterus.       Right eye: No discharge.        Left eye: No discharge.     Extraocular Movements: Extraocular movements intact.     Conjunctiva/sclera: Conjunctivae normal.     Pupils: Pupils are equal, round, and reactive to light.  Neck:     Vascular: No carotid bruit.  Cardiovascular:     Rate and Rhythm: Normal rate and regular rhythm.     Pulses: Normal pulses.     Heart sounds: Normal heart sounds. No murmur heard.   No friction rub. No gallop.  Pulmonary:     Effort: Pulmonary effort is normal. No respiratory distress.     Breath sounds: Normal breath sounds. No wheezing, rhonchi or rales.  Chest:     Chest wall: No tenderness.  Abdominal:     General: Bowel sounds are normal. There is no distension.     Palpations: Abdomen is soft. There is no mass.     Tenderness: There is no abdominal tenderness. There is no right CVA tenderness, left CVA tenderness, guarding or rebound.  Musculoskeletal:        General: No swelling or tenderness. Normal range of motion.     Cervical back: Normal range of motion. No rigidity or tenderness.     Right lower leg: No edema.     Left lower leg: No edema.  Lymphadenopathy:     Cervical: No cervical adenopathy.  Skin:    General: Skin is warm and dry.     Coloration: Skin is not pale.     Findings: No bruising, erythema, lesion or rash.  Neurological:     Mental Status: She is alert and oriented to person, place, and time.     Cranial Nerves: No cranial nerve deficit.     Sensory: No sensory deficit.     Motor: No weakness.     Coordination:  Coordination normal.     Gait: Gait normal.  Psychiatric:        Mood and Affect: Mood normal.  Speech: Speech normal.        Behavior: Behavior normal.        Thought Content: Thought content normal.        Judgment: Judgment normal.    Labs reviewed: Recent Labs    02/08/20 0805 08/01/20 0807  NA 142 142  K 4.4 4.2  CL 105 104  CO2 28 29  GLUCOSE 132* 126*  BUN 19 20  CREATININE 0.98* 0.92  CALCIUM 9.7 9.4   Recent Labs    02/08/20 0805 08/01/20 0807  AST 19 19  ALT 15 15  BILITOT 0.5 0.5  PROT 7.2 7.3   Recent Labs    02/08/20 0805 08/01/20 0807  WBC 5.3 5.0  NEUTROABS 1,685 1,900  HGB 14.3 14.5  HCT 43.0 43.8  MCV 86.9 86.2  PLT 185 173   Lab Results  Component Value Date   TSH 1.95 08/01/2020   Lab Results  Component Value Date   HGBA1C 6.9 (H) 08/01/2020   Lab Results  Component Value Date   CHOL 174 08/01/2020   HDL 78 08/01/2020   LDLCALC 73 08/01/2020   TRIG 156 (H) 08/01/2020   CHOLHDL 2.2 08/01/2020    Significant Diagnostic Results in last 30 days:  DG Chest 2 View  Result Date: 11/07/2020 CLINICAL DATA:  Shortness of breath.  COVID 1 week ago. EXAM: CHEST - 2 VIEW COMPARISON:  04/28/2009. FINDINGS: Mild enlargement the cardiac silhouette. Both lungs are clear. No visible pleural effusions or pneumothorax. No acute osseous abnormality. Polyarticular degenerative change. IMPRESSION: 1. No evidence of active cardiopulmonary disease. 2. Mild cardiomegaly. Electronically Signed   By: Margaretha Sheffield MD   On: 11/07/2020 13:27    Assessment/Plan 1. Fatigue, unspecified type Repots chronic fatigue. Latest TSH level and Hemoglobin within normal range. - SARS-COV-2 RNA,(COVID-19) QUAL NAAT - Ambulatory referral to Cardiology  2. Exposure to COVID-19 virus COVID-19 test at home was negative. - SARS-COV-2 RNA,(COVID-19) QUAL NAAT  3. Palpitations Occasional palpitation with fatigue  - EKG 12-Lead indicates Sinus Rhythm with HR 67  b/min  - Ambulatory referral to Cardiology for further evaluation of occasional palpitation   4. Need for Tdap vaccination Advised to get Tdap vaccine at the pharmacy. Script send to pharmacy today - Tdap ([place]) 5-2.5-18.5 LF-MCG/0.5 injection; Inject 0.5 mLs into the muscle once for 1 dose.  Dispense: 0.5 mL; Refill: 0  5. Type 2 diabetes mellitus with diabetic polyneuropathy, without long-term current use of insulin (HCC) Lab Results  Component Value Date   HGBA1C 6.9 (H) 08/01/2020  Well controlled  Reports no signs of hypoglycemia - Ambulatory referral to Podiatry request referral to Dr. Allena Katz tel # [phone]  - Ambulatory referral to Ophthalmology referral to Dr.Groat per pt's request   6. Colon cancer screening Asymptomatic  - Ambulatory referral to Gastroenterology request referral to Dr.Hung Polo Riley # [phone]   Family/ staff Communication: Reviewed plan of care with patient verbalized understanding   Labs/tests ordered: - SARS-COV-2 RNA,(COVID-19) QUAL NAAT  Next Appointment: Has appointment 02/03/2021  [name], NP

## 2020-12-07 LAB — SARS-COV-2 RNA,(COVID-19) QUALITATIVE NAAT: SARS CoV2 RNA: NOT DETECTED

## 2020-12-12 ENCOUNTER — Telehealth: Payer: Self-pay

## 2020-12-12 ENCOUNTER — Other Ambulatory Visit: Payer: Self-pay

## 2020-12-12 DIAGNOSIS — Z23 Encounter for immunization: Secondary | ICD-10-CM

## 2020-12-12 MED ORDER — TETANUS-DIPHTH-ACELL PERTUSSIS 5-2.5-18.5 LF-MCG/0.5 IM SUSP: 0.5000 mL | Freq: Once | INTRAMUSCULAR | 0 refills | Status: AC

## 2020-12-12 NOTE — Addendum Note (Signed)
Addended by: Carroll Kinds on: 12/12/2020 10:22 AM   Modules accepted: Orders, Level of Service, SmartSet

## 2020-12-12 NOTE — Progress Notes (Signed)
This encounter was created in error - please disregard.

## 2020-12-12 NOTE — Telephone Encounter (Signed)
Patient called stating that prescription for tetanus/tdap was not sent to pharmacy. Routed to Sherrie Mustache, NP (covering for [employer]) for approval. Note from 12/06/2020 stated it would be sent to pharmacy  Need for Tdap vaccination Advised to get Tdap vaccine at the pharmacy. Script send to pharmacy today - Tdap ([place]) 5-2.5-18.5 LF-MCG/0.5 injection; Inject 0.5 mLs into the muscle once for 1 dose.  Dispense: 0.5 mL; Refill: 0

## 2020-12-12 NOTE — Addendum Note (Signed)
Addended by: Carroll Kinds on: 12/12/2020 10:21 AM   Modules accepted: Orders

## 2020-12-21 DIAGNOSIS — R079 Chest pain, unspecified: Secondary | ICD-10-CM | POA: Diagnosis not present

## 2020-12-21 DIAGNOSIS — R1084 Generalized abdominal pain: Secondary | ICD-10-CM | POA: Diagnosis not present

## 2020-12-21 DIAGNOSIS — Z86010 Personal history of colonic polyps: Secondary | ICD-10-CM | POA: Diagnosis not present

## 2020-12-21 DIAGNOSIS — K5900 Constipation, unspecified: Secondary | ICD-10-CM | POA: Diagnosis not present

## 2020-12-23 ENCOUNTER — Encounter: Payer: Self-pay | Admitting: Podiatrist

## 2020-12-23 ENCOUNTER — Other Ambulatory Visit: Payer: Self-pay

## 2020-12-23 ENCOUNTER — Ambulatory Visit: Payer: Medicare HMO | Admitting: Podiatrist

## 2020-12-23 ENCOUNTER — Ambulatory Visit (INDEPENDENT_AMBULATORY_CARE_PROVIDER_SITE_OTHER): Payer: Medicare HMO

## 2020-12-23 DIAGNOSIS — M205X1 Other deformities of toe(s) (acquired), right foot: Secondary | ICD-10-CM | POA: Diagnosis not present

## 2020-12-23 DIAGNOSIS — M79675 Pain in left toe(s): Secondary | ICD-10-CM

## 2020-12-23 DIAGNOSIS — M79674 Pain in right toe(s): Secondary | ICD-10-CM | POA: Diagnosis not present

## 2020-12-23 DIAGNOSIS — E1142 Type 2 diabetes mellitus with diabetic polyneuropathy: Secondary | ICD-10-CM

## 2020-12-23 DIAGNOSIS — M205X2 Other deformities of toe(s) (acquired), left foot: Secondary | ICD-10-CM

## 2020-12-26 NOTE — Progress Notes (Signed)
Chief Complaint  Patient presents with   Toe Pain    PT states she has bilateral toe pain, numbness and cramps.     HPI: Patient is 75 y.o. female who presents today for the concerns as listed above.  She relates pain in the toes with cramping and numbness as well.  Pain at the great toe right greater than left most sigficant area of discomfort.   Patient Active Problem List   Diagnosis Date Noted   Claudication ([place]) 08/04/2020   Vitreous floaters, left 04/19/2020   Posterior vitreous detachment, left eye 04/19/2020   Vitreomacular traction, left 03/22/2020   Posterior vitreous detachment of right eye 03/22/2020   Nuclear sclerotic cataract of right eye 03/22/2020   Chronic pain of both knees 08/12/2019   Unsteady gait 08/12/2019   Slow transit constipation 08/12/2019   High risk medication use 07/24/2017   Depression 01/23/2017   Costochondritis 07/25/2016   Varicose veins of both lower extremities with complications 81/15/7262   Short-term memory loss 02/03/2016   Pain in joint, multiple sites 02/03/2016   Chronic seasonal allergic rhinitis due to pollen 02/03/2016   Gastroesophageal reflux disease without esophagitis 07/03/2015   Type 2 diabetes mellitus with diabetic polyneuropathy, without long-term current use of insulin ([place]) 07/03/2015   Hyperlipidemia LDL goal <100 07/03/2015   Benign essential HTN 07/03/2015   Essential hypertension 01/15/2014   Palpitations 01/15/2014   Hyperlipidemia 01/15/2014   DOE (dyspnea on exertion) 01/15/2014   PAC (premature atrial contraction) 01/15/2014   S/P total hysterectomy 06/21/2011    Current Outpatient Medications on File Prior to Visit  Medication Sig Dispense Refill   ACCU-CHEK AVIVA PLUS test strip USE TO TEST BLOOD SUGAR TWICE DAILY 200 strip 6   Accu-Chek Softclix Lancets lancets USE  TO TEST BLOOD SUGAR ONE TIME DAILY FOR DIABETES 100 each 6   Blood Glucose Monitoring Suppl (ACCU-CHEK AVIVA PLUS) w/Device KIT Use to  test blood sugar twice daily. Dx E11.42 1 kit 0   cholecalciferol (VITAMIN D) 1000 UNITS tablet Take 1,000 Units by mouth daily.     fluticasone (FLONASE) 50 MCG/ACT nasal spray Place 2 sprays into both nostrils as needed.      Lancets Misc. (ACCU-CHEK SOFTCLIX LANCET DEV) KIT Use to test blood sugar. Dx: E11.42 1 kit 0   lisinopril (ZESTRIL) 2.5 MG tablet Take 1 tablet (2.5 mg total) by mouth daily. 90 tablet 3   loratadine (CLARITIN) 10 MG tablet Take 10 mg by mouth daily as needed.     LUMIGAN 0.01 % SOLN Place 1 drop into the right eye at bedtime.     metFORMIN (GLUCOPHAGE) 500 MG tablet TAKE 2 TABLETS EVERY DAY WITH BREAKFAST 180 tablet 1   metoprolol tartrate (LOPRESSOR) 25 MG tablet Take 0.5 tablets (12.5 mg total) by mouth 2 (two) times daily as needed (palpitations). 90 tablet 1   Multiple Vitamins-Minerals (MULTIVITAMIN WITH MINERALS) tablet Take 1 tablet by mouth daily.     multivitamin-lutein (OCUVITE-LUTEIN) CAPS capsule Take 1 capsule by mouth daily.     Omega-3 Fatty Acids (FISH OIL) 1000 MG CAPS Take 2 capsules by mouth 2 (two) times a week.      polyethylene glycol (MIRALAX / GLYCOLAX) packet Take 17 g by mouth daily as needed. For constipation     prednisoLONE acetate (PRED FORTE) 1 % ophthalmic suspension Place 1 drop into the left eye in the morning, at noon, in the evening, and at bedtime.     Probiotic Product (ALIGN) 4  MG CAPS Take 1 capsule by mouth daily.      Propylene Glycol (SYSTANE COMPLETE OP) Place 1 drop into both eyes in the morning, at noon, in the evening, and at bedtime.     rosuvastatin (CRESTOR) 5 MG tablet TAKE 1 TABLET EVERY DAY 90 tablet 1   senna-docusate (SENOKOT-S) 8.6-50 MG tablet Take 1 tablet by mouth at bedtime. 90 tablet 3   traZODone (DESYREL) 50 MG tablet Take 1/2 to 1 tablet by mouth once daily as needed     No current facility-administered medications on file prior to visit.    Allergies  Allergen Reactions   Aspirin    Pravastatin Sodium  Other (See Comments)    Muscle aches   Restasis [Cyclosporine] Other (See Comments)    Review of Systems No fevers, chills, nausea, muscle aches, no difficulty breathing, no calf pain, no chest pain or shortness of breath.   Physical Exam  GENERAL APPEARANCE: Alert, conversant. Appropriately groomed. No acute distress.   VASCULAR: Pedal pulses palpable DP and PT bilateral.  Capillary refill time is immediate to all digits,  Proximal to distal cooling it warm to warm.  Digital perfusion adequate.   NEUROLOGIC: sensation is decreased to 5.07 monofilament at 3/5 sites bilateral.  Light touch is intact bilateral, vibratory sensation intact bilateral  MUSCULOSKELETAL: acceptable muscle strength, tone and stability bilateral.  No gross boney pedal deformities noted. Mild decrease in range of motion at the first metatarsal phalangeal joint bilateral is noted.  Some discomfort at the hallux ip joint as well.    DERMATOLOGIC: skin is warm, supple, and dry.  No open lesions noted.  No rash, no pre ulcerative lesions. Digital nails are asymptomatic.    Xrays evaluation:  3 views bilateral feet obtained.  Decrease in joint space at the first mp joint as well as the hallux ip joint noted.  No osteophytic spurring at these joints seen.  Osseous mineralization is normal.  No acute osseous abnormalities seen.   Assessment   1. Toe pain, bilateral   2. Type 2 diabetes mellitus with diabetic polyneuropathy, without long-term current use of insulin (HCC)   3. Hallux limitus of right foot      Plan  Discussed exam and xray findings with the patient.  I do feel like she would benefit from diabetic shoes with accommodative insoles for her hallux limitus as well as her general foot health.  Will make an appointment for her to be measured for diabetic shoes and inserts.  She will let us know how the shoes do and if her symptoms do not improve, she will call.

## 2020-12-27 ENCOUNTER — Ambulatory Visit (INDEPENDENT_AMBULATORY_CARE_PROVIDER_SITE_OTHER): Payer: Medicare HMO | Admitting: Family

## 2020-12-27 ENCOUNTER — Other Ambulatory Visit: Payer: Self-pay

## 2020-12-27 ENCOUNTER — Ambulatory Visit (INDEPENDENT_AMBULATORY_CARE_PROVIDER_SITE_OTHER): Payer: Medicare HMO | Admitting: *Deleted

## 2020-12-27 ENCOUNTER — Encounter: Payer: Self-pay | Admitting: Family

## 2020-12-27 DIAGNOSIS — Z0000 Encounter for general adult medical examination without abnormal findings: Secondary | ICD-10-CM

## 2020-12-27 DIAGNOSIS — E1142 Type 2 diabetes mellitus with diabetic polyneuropathy: Secondary | ICD-10-CM

## 2020-12-27 DIAGNOSIS — M205X1 Other deformities of toe(s) (acquired), right foot: Secondary | ICD-10-CM

## 2020-12-27 NOTE — Patient Instructions (Signed)
Sierra Ray , Thank you for taking time to come for your Medicare Wellness Visit. I appreciate your ongoing commitment to your health goals. Please review the following plan we discussed and let me know if I can assist you in the future.   Screening recommendations/referrals: Colonoscopy: up to date  Mammogram : Up to date  Bone Density : Up to date  Recommended yearly ophthalmology/optometry visit for glaucoma screening and checkup Recommended yearly dental visit for hygiene and checkup  Vaccinations: Influenza vaccine : Due  Pneumococcal vaccine : Up to date  Tdap vaccine : Please get vaccine at your pharmacy  Shingles vaccine : declined     Advanced directives: Yes   Conditions/risks identified: Type 2 DM,Hypertension,Sedentary  Next appointment: 1 year    Preventive Care 74 Years and Older, Female Preventive care refers to lifestyle choices and visits with your health care provider that can promote health and wellness. What does preventive care include? A yearly physical exam. This is also called an annual well check. Dental exams once or twice a year. Routine eye exams. Ask your health care provider how often you should have your eyes checked. Personal lifestyle choices, including: Daily care of your teeth and gums. Regular physical activity. Eating a healthy diet. Avoiding tobacco and drug use. Limiting alcohol use. Practicing safe sex. Taking low-dose aspirin every day. Taking vitamin and mineral supplements as recommended by your health care provider. What happens during an annual well check? The services and screenings done by your health care provider during your annual well check will depend on your age, overall health, lifestyle risk factors, and family history of disease. Counseling  Your health care provider may ask you questions about your: Alcohol use. Tobacco use. Drug use. Emotional well-being. Home and relationship well-being. Sexual  activity. Eating habits. History of falls. Memory and ability to understand (cognition). Work and work Statistician. Reproductive health. Screening  You may have the following tests or measurements: Height, weight, and BMI. Blood pressure. Lipid and cholesterol levels. These may be checked every 5 years, or more frequently if you are over 4 years old. Skin check. Lung cancer screening. You may have this screening every year starting at age 22 if you have a 30-pack-year history of smoking and currently smoke or have quit within the past 15 years. Fecal occult blood test (FOBT) of the stool. You may have this test every year starting at age 71. Flexible sigmoidoscopy or colonoscopy. You may have a sigmoidoscopy every 5 years or a colonoscopy every 10 years starting at age 68. Hepatitis C blood test. Hepatitis B blood test. Sexually transmitted disease (STD) testing. Diabetes screening. This is done by checking your blood sugar (glucose) after you have not eaten for a while (fasting). You may have this done every 1-3 years. Bone density scan. This is done to screen for osteoporosis. You may have this done starting at age 10. Mammogram. This may be done every 1-2 years. Talk to your health care provider about how often you should have regular mammograms. Talk with your health care provider about your test results, treatment options, and if necessary, the need for more tests. Vaccines  Your health care provider may recommend certain vaccines, such as: Influenza vaccine. This is recommended every year. Tetanus, diphtheria, and acellular pertussis (Tdap, Td) vaccine. You may need a Td booster every 10 years. Zoster vaccine. You may need this after age 72. Pneumococcal 13-valent conjugate (PCV13) vaccine. One dose is recommended after age 93. Pneumococcal polysaccharide (PPSV23)  vaccine. One dose is recommended after age 66. Talk to your health care provider about which screenings and vaccines  you need and how often you need them. This information is not intended to replace advice given to you by your health care provider. Make sure you discuss any questions you have with your health care provider. Document Released: 04/29/2015 Document Revised: 12/21/2015 Document Reviewed: 02/01/2015 Elsevier Interactive Patient Education  2017 [employer] Prevention in the Home Falls can cause injuries. They can happen to people of all ages. There are many things you can do to make your home safe and to help prevent falls. What can I do on the outside of my home? Regularly fix the edges of walkways and driveways and fix any cracks. Remove anything that might make you trip as you walk through a door, such as a raised step or threshold. Trim any bushes or trees on the path to your home. Use bright outdoor lighting. Clear any walking paths of anything that might make someone trip, such as rocks or tools. Regularly check to see if handrails are loose or broken. Make sure that both sides of any steps have handrails. Any raised decks and porches should have guardrails on the edges. Have any leaves, snow, or ice cleared regularly. Use sand or salt on walking paths during winter. Clean up any spills in your garage right away. This includes oil or grease spills. What can I do in the bathroom? Use night lights. Install grab bars by the toilet and in the tub and shower. Do not use towel bars as grab bars. Use non-skid mats or decals in the tub or shower. If you need to sit down in the shower, use a plastic, non-slip stool. Keep the floor dry. Clean up any water that spills on the floor as soon as it happens. Remove soap buildup in the tub or shower regularly. Attach bath mats securely with double-sided non-slip rug tape. Do not have throw rugs and other things on the floor that can make you trip. What can I do in the bedroom? Use night lights. Make sure that you have a light by your bed that  is easy to reach. Do not use any sheets or blankets that are too big for your bed. They should not hang down onto the floor. Have a firm chair that has side arms. You can use this for support while you get dressed. Do not have throw rugs and other things on the floor that can make you trip. What can I do in the kitchen? Clean up any spills right away. Avoid walking on wet floors. Keep items that you use a lot in easy-to-reach places. If you need to reach something above you, use a strong step stool that has a grab bar. Keep electrical cords out of the way. Do not use floor polish or wax that makes floors slippery. If you must use wax, use non-skid floor wax. Do not have throw rugs and other things on the floor that can make you trip. What can I do with my stairs? Do not leave any items on the stairs. Make sure that there are handrails on both sides of the stairs and use them. Fix handrails that are broken or loose. Make sure that handrails are as long as the stairways. Check any carpeting to make sure that it is firmly attached to the stairs. Fix any carpet that is loose or worn. Avoid having throw rugs at the top or bottom  of the stairs. If you do have throw rugs, attach them to the floor with carpet tape. Make sure that you have a light switch at the top of the stairs and the bottom of the stairs. If you do not have them, ask someone to add them for you. What else can I do to help prevent falls? Wear shoes that: Do not have high heels. Have rubber bottoms. Are comfortable and fit you well. Are closed at the toe. Do not wear sandals. If you use a stepladder: Make sure that it is fully opened. Do not climb a closed stepladder. Make sure that both sides of the stepladder are locked into place. Ask someone to hold it for you, if possible. Clearly mark and make sure that you can see: Any grab bars or handrails. First and last steps. Where the edge of each step is. Use tools that help you  move around (mobility aids) if they are needed. These include: Canes. Walkers. Scooters. Crutches. Turn on the lights when you go into a dark area. Replace any light bulbs as soon as they burn out. Set up your furniture so you have a clear path. Avoid moving your furniture around. If any of your floors are uneven, fix them. If there are any pets around you, be aware of where they are. Review your medicines with your doctor. Some medicines can make you feel dizzy. This can increase your chance of falling. Ask your doctor what other things that you can do to help prevent falls. This information is not intended to replace advice given to you by your health care provider. Make sure you discuss any questions you have with your health care provider. Document Released: 01/27/2009 Document Revised: 09/08/2015 Document Reviewed: 05/07/2014 Elsevier Interactive Patient Education  2017 [employer].

## 2020-12-27 NOTE — Progress Notes (Signed)
This service is provided via telemedicine  No vital signs collected/recorded due to the encounter was a telemedicine visit.   Location of patient (ex: home, work):  Home.  Patient consents to a telephone visit:  Yes.  Location of the provider (ex: office, home):  [employer].   Name of any referring provider:  Davieon Stockham, [name], NP   Names of all persons participating in the telemedicine service and their role in the encounter:  Patient, Sierra Ray, [name], [name], [name], NP.    Time spent on call: 8 minutes spent on the phone with Medical Assistant.     Subjective:   Sierra Mazzarella V. is a 75 y.o. female who presents for Medicare Annual (Subsequent) preventive examination.  Review of Systems     Cardiac Risk Factors include: diabetes mellitus;hypertension;sedentary lifestyle     Objective:    Today's Vitals   12/27/20 1324  PainSc: 7    There is no height or weight on file to calculate BMI.  Advanced Directives 12/27/2020 12/06/2020 08/04/2020 02/12/2020 12/25/2019 08/12/2019 02/11/2019  Does Patient Have a Medical Advance Directive? Yes Yes Yes Yes Yes Yes Yes  Type of Advance Directive Living will Living will Living will Living will;Healthcare Power of [place] will [place];Living will  Does patient want to make changes to medical advance directive? No - Patient declined No - Patient declined No - Patient declined No - Patient declined No - Patient declined No - Patient declined No - Patient declined  Copy of [place] in Chart? - - - Yes - validated most recent copy scanned in chart (See row information) Yes - validated most recent copy scanned in chart (See row information) - Yes - validated most recent copy scanned in chart (See row information)    Current Medications (verified) Outpatient Encounter Medications as of 12/27/2020  Medication Sig   ACCU-CHEK AVIVA  PLUS test strip USE TO TEST BLOOD SUGAR TWICE DAILY   Accu-Chek Softclix Lancets lancets USE  TO TEST BLOOD SUGAR ONE TIME DAILY FOR DIABETES   Blood Glucose Monitoring Suppl (ACCU-CHEK AVIVA PLUS) w/Device KIT Use to test blood sugar twice daily. Dx E11.42   cholecalciferol (VITAMIN D) 1000 UNITS tablet Take 1,000 Units by mouth daily.   fluticasone (FLONASE) 50 MCG/ACT nasal spray Place 2 sprays into both nostrils as needed.    Lancets Misc. (ACCU-CHEK SOFTCLIX LANCET DEV) KIT Use to test blood sugar. Dx: E11.42   lisinopril (ZESTRIL) 2.5 MG tablet Take 1 tablet (2.5 mg total) by mouth daily.   loratadine (CLARITIN) 10 MG tablet Take 10 mg by mouth daily as needed.   LUMIGAN 0.01 % SOLN Place 1 drop into the right eye at bedtime.   metFORMIN (GLUCOPHAGE) 500 MG tablet TAKE 2 TABLETS EVERY DAY WITH BREAKFAST   metoprolol tartrate (LOPRESSOR) 25 MG tablet Take 0.5 tablets (12.5 mg total) by mouth 2 (two) times daily as needed (palpitations).   Multiple Vitamins-Minerals (MULTIVITAMIN WITH MINERALS) tablet Take 1 tablet by mouth daily.   multivitamin-lutein (OCUVITE-LUTEIN) CAPS capsule Take 1 capsule by mouth daily.   Omega-3 Fatty Acids (FISH OIL) 1000 MG CAPS Take 2 capsules by mouth 2 (two) times a week.    polyethylene glycol (MIRALAX / GLYCOLAX) packet Take 17 g by mouth daily as needed. For constipation   prednisoLONE acetate (PRED FORTE) 1 % ophthalmic suspension Place 1 drop into the left eye in the morning, at noon, in the  evening, and at bedtime.   Probiotic Product (ALIGN) 4 MG CAPS Take 1 capsule by mouth daily.    Propylene Glycol (SYSTANE COMPLETE OP) Place 1 drop into both eyes in the morning, at noon, in the evening, and at bedtime.   rosuvastatin (CRESTOR) 5 MG tablet TAKE 1 TABLET EVERY DAY   senna-docusate (SENOKOT-S) 8.6-50 MG tablet Take 1 tablet by mouth at bedtime.   traZODone (DESYREL) 50 MG tablet Take 1/2 to 1 tablet by mouth once daily as needed   No  facility-administered encounter medications on file as of 12/27/2020.    Allergies (verified) Aspirin, Pravastatin sodium, and Restasis [cyclosporine]   History: Past Medical History:  Diagnosis Date   Abnormal Pap smear 05/08/2011   ASC-US +HPV   Arthritis    Asthma    Constipation    Diabetes mellitus type 2 with neurological manifestations (HCC)    Diabetic peripheral neuropathy (HCC)    Generalized osteoarthritis    GERD (gastroesophageal reflux disease)    Hx of seasonal allergies    Hyperlipidemia    Hypertension    IBS (irritable bowel syndrome)    S/P total hysterectomy 06/21/2011   Past Surgical History:  Procedure Laterality Date   CATARACT EXTRACTION Left    EYE SURGERY Right 01/2018   Removed raised area on eye. Dr.Groat    OVARIAN CYST SURGERY  1986   PELVIC LAPAROSCOPY     TONSILECTOMY, ADENOIDECTOMY, BILATERAL MYRINGOTOMY AND TUBES  1974   VAGINAL HYSTERECTOMY  1983   Family History  Problem Relation Age of Onset   Diabetes Father    Heart disease Father    Diabetes Mother    Cancer Mother        colon    Pancreatic cancer Brother    Pancreatic cancer Sister    Cancer Paternal Grandmother        liver    Breast cancer Cousin    Breast cancer Maternal Aunt    Social History   Socioeconomic History   Marital status: Single    Spouse name: Not on file   Number of children: 4   Years of education: Not on file   Highest education level: Not on file  Occupational History    Employer: RETIRED  Tobacco Use   Smoking status: Never   Smokeless tobacco: Never  Vaping Use   Vaping Use: Never used  Substance and Sexual Activity   Alcohol use: No   Drug use: No   Sexual activity: Not Currently    Birth control/protection: Surgical    Comment: 1st intercourse- 20, partners- 20,   Other Topics Concern   Not on file  Social History Narrative   Diet?    Low fat, low carb, low salt      Do you drink/eat things with caffeine?      Marital status?   single                                  What year were you married?      Do you live in a house, apartment, assisted living, condo, trailer, etc.?      Is it one or more stories?      How many persons live in your home?      Do you have any pets in your home? (please list)      Current or past profession:      Do  you exercise?   yes                 Type & how often? Yoga, water aerobics 1-2x/week      Do you have a living will?      Do you have a DNR form?                                  If not, do you want to discuss one?      Do you have signed POA/HPOA for forms?       Social Determinants of Health   Financial Resource Strain: Not on file  Food Insecurity: Not on file  Transportation Needs: Not on file  Physical Activity: Not on file  Stress: Not on file  Social Connections: Not on file    Tobacco Counseling Counseling given: Not Answered   Clinical Intake:  Pre-visit preparation completed: No  Pain : 0-10 Pain Score: 7  Pain Type: Chronic pain Pain Location: Knee (neck,lower back) Pain Orientation: Left, Right Pain Radiating Towards: No Pain Descriptors / Indicators: Sharp Pain Onset:  (on and off for > 20 yrs) Pain Frequency: Intermittent Pain Relieving Factors: Topicall patch,tylenol Effect of Pain on Daily Activities: yes especially sleep and walking  Pain Relieving Factors: Topicall patch,tylenol  BMI - recorded: 24.78 Nutritional Status: BMI of 19-24  Normal Nutritional Risks: None Diabetes: Yes CBG done?: Yes (reports 100 this morning) CBG resulted in Enter/ Edit results?: No Did pt. bring in CBG monitor from home?: No (state in the 100's)  How often do you need to have someone help you when you read instructions, pamphlets, or other written materials from your doctor or pharmacy?: 1 - Never What is the last grade level you completed in school?: 2 yrs College  Diabetic?Yes   Interpreter Needed?: No  Information entered by :: Malayja Freund,FNP-C   Activities of Daily Living In your present state of health, do you have any difficulty performing the following activities: 12/27/2020  Hearing? N  Vision? N  Difficulty concentrating or making decisions? Y  Comment Remembering  Walking or climbing stairs? Y  Dressing or bathing? N  Doing errands, shopping? Y  Comment [place] and eating ? N  Using the Toilet? Y  Comment constipation.follows up with GI  In the past six months, have you accidently leaked urine? N  Do you have problems with loss of bowel control? N  Managing your Medications? N  Managing your Finances? N  Housekeeping or managing your Housekeeping? N  Some recent data might be hidden    Patient Care Team: Earsie Humm, [name], NP as PCP - General (Family Medicine) Dorothy Spark, MD (Inactive) as Consulting Physician (Cardiology) Clent Jacks, MD as Consulting Physician (Ophthalmology)  Indicate any recent Medical Services you may have received from other than [facility] providers in the past year (date may be approximate).     Assessment:   This is a routine wellness examination for Ersilia.  Hearing/Vision screen Hearing Screening - Comments:: No Hearing Concerns. Patient doesn't wear hearing aids.  Vision Screening - Comments:: No vision concerns. Patient wears prescription glasses. Patient last eye exam January 2022  Dietary issues and exercise activities discussed: Current Exercise Habits: The patient does not participate in regular exercise at present, Exercise limited by: Other - see comments (chronic knee and lower back)   Goals Addressed  This Visit's Progress    Exercise 3x per week (30 min per time)   Not on track    Starting 07/03/16 I will start doing exercise three days a week.I would like to walk a total of 30 min three times a week and eventually get to aquatics.     Patient Stated   Not on track    Patient will like to get back into  aquatics with her friends     Patient Stated   Not on track    Be able sleep 6-8 hrs        Depression Screen PHQ 2/9 Scores 12/27/2020 12/25/2019 02/11/2019 07/24/2017 07/24/2017 01/23/2017 07/09/2016  PHQ - 2 Score 0 0 0 0 0 0 4  PHQ- 9 Score - - - - - - 15    Fall Risk Fall Risk  12/27/2020 12/06/2020 08/04/2020 02/12/2020 12/25/2019  Falls in the past year? 0 0 0 0 0  Comment - - - - -  Number falls in past yr: 0 0 0 0 0  Injury with Fall? 0 0 0 0 0  Risk for fall due to : No Fall Risks No Fall Risks - - -  Follow up Falls evaluation completed Falls evaluation completed - - -    FALL RISK PREVENTION PERTAINING TO THE HOME:  Any stairs in or around the home? No  If so, are there any without handrails? No  Home free of loose throw rugs in walkways, pet beds, electrical cords, etc? No  Adequate lighting in your home to reduce risk of falls? Yes   ASSISTIVE DEVICES UTILIZED TO PREVENT FALLS:  Life alert? No  Use of a cane, walker or w/c? Yes  Grab bars in the bathroom? Yes  Shower chair or bench in shower? Yes  Elevated toilet seat or a handicapped toilet? Yes   TIMED UP AND GO:  Was the test performed? No .  Length of time to ambulate 10 feet: N/A  sec.   Gait slow and steady with assistive device  Cognitive Function: MMSE - Mini Mental State Exam 07/24/2017 07/03/2016 07/01/2015  Not completed: - - (No Data)  Orientation to time '5 5 5  ' Orientation to Place '5 5 5  ' Registration '3 3 3  ' Attention/ Calculation '5 5 5  ' Recall '1 2 3  ' Language- name 2 objects '2 2 2  ' Language- repeat '1 1 1  ' Language- follow 3 step command '3 3 3  ' Language- read & follow direction '1 1 1  ' Write a sentence '1 1 1  ' Copy design '1 1 1  ' Total score '28 29 30     ' 6CIT Screen 12/27/2020 12/25/2019  What Year? 0 points 0 points  What month? 0 points 0 points  What time? 0 points 0 points  Count back from 20 0 points 0 points  Months in reverse 0 points 0 points  Repeat phrase 4 points 0 points   Total Score 4 0    Immunizations Immunization History  Administered Date(s) Administered   Fluad Quad(high Dose 65+) 02/11/2019, 02/12/2020   Influenza, High Dose Seasonal PF 01/28/2018   Influenza,inj,Quad PF,6+ Mos 02/03/2016, 01/23/2017   Moderna Sars-Covid-2 Vaccination 06/22/2019, 07/24/2019   Pneumococcal Conjugate-13 07/30/2014   Pneumococcal Polysaccharide-23 11/17/2005, 05/20/2012   Td 08/18/2010    TDAP status: Due, Education has been provided regarding the importance of this vaccine. Advised may receive this vaccine at local pharmacy or Health Dept. Aware to provide a copy of the vaccination record  if obtained from local pharmacy or Health Dept. Verbalized acceptance and understanding.  Flu Vaccine status: Due, Education has been provided regarding the importance of this vaccine. Advised may receive this vaccine at local pharmacy or Health Dept. Aware to provide a copy of the vaccination record if obtained from local pharmacy or Health Dept. Verbalized acceptance and understanding.  Pneumococcal vaccine status: Up to date  Covid-19 vaccine status: Information provided on how to obtain vaccines.   Qualifies for Shingles Vaccine? Yes   Zostavax completed No   Shingrix Completed?: No.    Education has been provided regarding the importance of this vaccine. Patient has been advised to call insurance company to determine out of pocket expense if they have not yet received this vaccine. Advised may also receive vaccine at local pharmacy or Health Dept. Verbalized acceptance and understanding.  Screening Tests Health Maintenance  Topic Date Due   Zoster Vaccines- Shingrix (1 of 2) Never done   FOOT EXAM  01/23/2018   COVID-19 Vaccine (3 - Moderna risk series) 08/21/2019   TETANUS/TDAP  08/17/2020   OPHTHALMOLOGY EXAM  10/26/2020   INFLUENZA VACCINE  11/14/2020   HEMOGLOBIN A1C  01/31/2021   MAMMOGRAM  11/05/2021   COLONOSCOPY (Pts 45-15yr Insurance coverage will need to  be confirmed)  09/05/2025   DEXA SCAN  Completed   Hepatitis C Screening  Completed   PNA vac Low Risk Adult  Completed   HPV VACCINES  Aged Out    Health Maintenance  Health Maintenance Due  Topic Date Due   Zoster Vaccines- Shingrix (1 of 2) Never done   FOOT EXAM  01/23/2018   COVID-19 Vaccine (3 - Moderna risk series) 08/21/2019   TETANUS/TDAP  08/17/2020   OPHTHALMOLOGY EXAM  10/26/2020   INFLUENZA VACCINE  11/14/2020    Colorectal cancer screening: Type of screening: Colonoscopy. Completed 09/06/2015. Repeat every 5 years  Mammogram status: Completed 11/06/2019. Repeat every year  Bone Density status: Completed 08/23/2020. Results reflect: Bone density results: OSTEOPENIA. Repeat every 2 years.  Lung Cancer Screening: (Low Dose CT Chest recommended if Age 73-80years, 30 pack-year currently smoking OR have quit w/in 15years.) does not qualify.   Lung Cancer Screening Referral: No   Additional Screening:  Hepatitis C Screening: does not qualify; Completed Yes   Vision Screening: Recommended annual ophthalmology exams for early detection of glaucoma and other disorders of the eye. Is the patient up to date with their annual eye exam?  Yes  Who is the provider or what is the name of the office in which the patient attends annual eye exams? Dr.Groat  If pt is not established with a provider, would they like to be referred to a provider to establish care? No .   Dental Screening: Recommended annual dental exams for proper oral hygiene  Community Resource Referral / Chronic Care Management: CRR required this visit?  No   CCM required this visit?  No      Plan:     I have personally reviewed and noted the following in the patient's chart:   Medical and social history Use of alcohol, tobacco or illicit drugs  Current medications and supplements including opioid prescriptions.  Functional ability and status Nutritional status Physical activity Advanced  directives List of other physicians Hospitalizations, surgeries, and ER visits in previous 12 months Vitals Screenings to include cognitive, depression, and falls Referrals and appointments  In addition, I have reviewed and discussed with patient certain preventive protocols, quality metrics, and best practice  recommendations. A written personalized care plan for preventive services as well as general preventive health recommendations were provided to patient.     [name], NP   12/27/2020   Nurse Notes: Advised to get COVID-19,Tdap vaccine at her pharmacy.tdap vaccine script send on previous visit.will get influenza vaccine on upcoming visit here at the office. Declined Zoster vaccine.

## 2020-12-27 NOTE — Progress Notes (Signed)
Patient presents to the office today for diabetic shoe and insole measuring.  Patient was measured with brannock device to determine size and width for 1 pair of extra depth shoes and foam casted for 3 pair of insoles.   Documentation of medical necessity will be sent to patient's treating diabetic doctor to verify and sign.   Patient's diabetic provider: Marlowe Sax, NP (Dr. Unice Cobble)  Shoes and insoles will be ordered at that time and patient will be notified for an appointment for fitting when they arrive.   Shoe size (per patient): 11   Brannock measurement: RIGHT - 10 A, LEFT - 9.5 B  Patient shoe selection-   1st choice:   [id number]  2nd choice:  [id number]  Shoe size ordered: Women's 10 Medium

## 2020-12-28 ENCOUNTER — Ambulatory Visit (HOSPITAL_BASED_OUTPATIENT_CLINIC_OR_DEPARTMENT_OTHER): Payer: Medicare HMO | Admitting: General Practice

## 2021-01-03 NOTE — Progress Notes (Signed)
Cardiology Office Note:    Date:  01/03/2021   ID:  Sierra Whitwell V., DOB 24-Jun-1945, MRN [id number]  PCP:  [name], NP   [facility] Providers Cardiologist:  Freada Bergeron, MD      Referring MD: [name], NP   Follow-up for palpitations and lower extremity claudication  History of Present Illness:    Sierra Rausch V. is a 75 y.o. female with a hx of DOE, precordial pain, palpitations, essential hypertension, HLD.  She had a nuclear stress test in 2015 that showed normal EF and no ischemia.  She wore a cardiac event monitor which showed sinus bradycardia, sinus rhythm, PAC, and no sustained arrhythmias.  She has previously mentioned chest pain which was felt to be costochondritis.  Anti-inflammatory medication was recommended.  She followed up 08/27/2019 with Dr. [name].  During that time she reported that her palpitations were almost gone.  She did notice occasional episodes of palpitations.  They were not associated with chest pain, dizziness, shortness of breath, or syncope.  She continued to have right-sided chest pain at rest and with exertion.  She continued to be very physically active and was walking 3 times per week.  She did report some cramping in her lower extremities.  Lower extremity ABIs were ordered and were normal bilaterally.  She presents to the clinic today for follow-up evaluation states over the last 6 months she has noticed dizziness, increased weakness, and dyspnea with exertion.  She reports that her symptoms have slowly started to improve.  She denies bleeding issues.  She has been staying well-hydrated.  She continues to walk 3 days/week, use a stationary pedaling machine, and generally stays very physically active.  She has been watching her diet and has lost around 20 pounds over the last 6 months.  This is been achieved through reducing her caloric intake and maintaining her physical activity.  She continues to notice a dull  and fuzzy type sensation in her left chest.  She reports that the symptoms come on at rest but she does not typically notice them with increased physical activity.  We will order a CBC, BMP, 7-day cardiac event monitor, and echocardiogram.  I will have her maintain her physical activity and diet.  We will have her follow-up in 6 to 8 weeks.  Today she denies chest pain, increased shortness of breath, lower extremity edema, fatigue, palpitations, melena, hematuria, hemoptysis, diaphoresis, weakness, presyncope, syncope, orthopnea, and PND.     Past Medical History:  Diagnosis Date   Abnormal Pap smear 05/08/2011   ASC-US +HPV   Arthritis    Asthma    Constipation    Diabetes mellitus type 2 with neurological manifestations (HCC)    Diabetic peripheral neuropathy (HCC)    Generalized osteoarthritis    GERD (gastroesophageal reflux disease)    Hx of seasonal allergies    Hyperlipidemia    Hypertension    IBS (irritable bowel syndrome)    S/P total hysterectomy 06/21/2011    Past Surgical History:  Procedure Laterality Date   CATARACT EXTRACTION Left    EYE SURGERY Right 01/2018   Removed raised area on eye. Dr.Groat    OVARIAN CYST SURGERY  1986   PELVIC LAPAROSCOPY     TONSILECTOMY, ADENOIDECTOMY, BILATERAL MYRINGOTOMY AND TUBES  1974   VAGINAL HYSTERECTOMY  1983    Current Medications: No outpatient medications have been marked as taking for the 01/04/21 encounter (Appointment) with Deberah Pelton, NP.  Allergies:   Aspirin, Pravastatin sodium, and Restasis [cyclosporine]   Social History   Socioeconomic History   Marital status: Single    Spouse name: Not on file   Number of children: 4   Years of education: Not on file   Highest education level: Not on file  Occupational History    Employer: RETIRED  Tobacco Use   Smoking status: Never   Smokeless tobacco: Never  Vaping Use   Vaping Use: Never used  Substance and Sexual Activity   Alcohol use: No   Drug  use: No   Sexual activity: Not Currently    Birth control/protection: Surgical    Comment: 1st intercourse- 20, partners- 24,   Other Topics Concern   Not on file  Social History Narrative   Diet?    Low fat, low carb, low salt      Do you drink/eat things with caffeine?      Marital status?  single                                  What year were you married?      Do you live in a house, apartment, assisted living, condo, trailer, etc.?      Is it one or more stories?      How many persons live in your home?      Do you have any pets in your home? (please list)      Current or past profession:      Do you exercise?   yes                 Type & how often? Yoga, water aerobics 1-2x/week      Do you have a living will?      Do you have a DNR form?                                  If not, do you want to discuss one?      Do you have signed POA/HPOA for forms?       Social Determinants of Health   Financial Resource Strain: Not on file  Food Insecurity: Not on file  Transportation Needs: Not on file  Physical Activity: Not on file  Stress: Not on file  Social Connections: Not on file     Family History: The patient's family history includes Breast cancer in her cousin and maternal aunt; Cancer in her mother and paternal grandmother; Diabetes in her father and mother; Heart disease in her father; Pancreatic cancer in her brother and sister.  ROS:   Please see the history of present illness.     All other systems reviewed and are negative.   Risk Assessment/Calculations:           Physical Exam:    VS:  There were no vitals taken for this visit.    Wt Readings from Last 3 Encounters:  08/04/20 151 lb 3.2 oz (68.6 kg)  03/03/20 150 lb (68 kg)  02/12/20 149 lb 3.2 oz (67.7 kg)     GEN:  Well nourished, well developed in no acute distress HEENT: Normal NECK: No JVD; No carotid bruits LYMPHATICS: No lymphadenopathy CARDIAC: RRR, no murmurs, rubs,  gallops RESPIRATORY:  Clear to auscultation without rales, wheezing or rhonchi  ABDOMEN: Soft, non-tender, non-distended MUSCULOSKELETAL:  No  edema; No deformity  SKIN: Warm and dry NEUROLOGIC:  Alert and oriented x 3 PSYCHIATRIC:  Normal affect    EKGs/Labs/Other Studies Reviewed:    The following studies were reviewed today:  Nuclear stress test 01/25/2014  Notes Recorded by Dorothy Spark, MD on 01/29/2014 at 6:11 AM Normal stress test, normal LVEF, please let her know.  Echocardiogram 07/16/2017 Study Conclusions   - Left ventricle: The cavity size was normal. There was mild    concentric hypertrophy. Systolic function was normal. The    estimated ejection fraction was in the range of 60% to 65%. Wall    motion was normal; there were no regional wall motion    abnormalities. Doppler parameters are consistent with abnormal    left ventricular relaxation (grade 1 diastolic dysfunction).    Doppler parameters are consistent with high ventricular filling    pressure.  - Aortic valve: Transvalvular velocity was within the normal range.    There was no stenosis. There was no regurgitation.  - Mitral valve: Transvalvular velocity was within the normal range.    There was no evidence for stenosis. There was trivial    regurgitation.  - Right ventricle: The cavity size was normal. Wall thickness was    normal. Systolic function was normal.  - Atrial septum: No defect or patent foramen ovale was identified    by color flow Doppler.  - Tricuspid valve: There was trivial regurgitation.  - Pulmonary arteries: Systolic pressure was within the normal    range. PA peak pressure: 28 mm Hg (S).  EKG:  EKG is  ordered today.  The ekg ordered today demonstrates normal sinus rhythm anterior infarct undetermined age 32 bpm  Recent Labs: 08/01/2020: ALT 15; BUN 20; Creat 0.92; Hemoglobin 14.5; Platelets 173; Potassium 4.2; Sodium 142; TSH 1.95  Recent Lipid Panel    Component Value  Date/Time   CHOL 174 08/01/2020 0807   CHOL 139 09/26/2015 0837   TRIG 156 (H) 08/01/2020 0807   HDL 78 08/01/2020 0807   HDL 57 09/26/2015 0837   CHOLHDL 2.2 08/01/2020 0807   VLDL 29 07/03/2016 0825   LDLCALC 73 08/01/2020 0807    ASSESSMENT & PLAN    Palpitations-EKG today shows normal sinus rhythm anterior infarct undetermined age 74 bpm.  Only occasional episodes of palpitations, about 1-2 times per day.  She is also now noted dizziness with laying down.  Previous cardiac event monitor showed sinus rhythm and sinus bradycardia.  No atrial fibrillation noted. Continue metoprolol Heart healthy low-sodium diet-salty 6 given Increase physical activity as tolerated Order CBC, BMP, 7-day cardiac event monitor  Weakness/DOE/fatigue-reports increased fatigue and weakness over the last 6 months.  Denies bleeding issues.  Has lost around 20 pounds over the last 6 months.  She has been following a reduced calorie diet. Order echocardiogram  Dizziness-reports dizziness over the last 6 months.  Mainly but laying back.  Unclear whether this is related to cardiac issues or neurological in origin. Order echocardiogram Begin Epley maneuvers  Hyperlipidemia-08/01/2020: Cholesterol 174; HDL 78; LDL Cholesterol (Calc) 73; Triglycerides 156 Continue rosuvastatin, omega-3 fatty acids Heart healthy low-sodium high-fiber diet Increase physical activity as tolerated  Essential hypertension-BP today 138/60.  Well-controlled on. Continue metoprolol, lisinopril Heart healthy low-sodium diet-salty 6 given Increase physical activity as tolerated   Precordial chest pain-continues to have brief occasional episodes of chest discomfort both at rest and with physical activity.  Low risk nuclear stress test 2015. Continue to monitor.  Lower extremity claudication-continues  to have occasional episodes of lower extremity cramping.  ABIs 09/16/2019 were normal. Increase physical activity as tolerated Maintain  hydration Continue heat therapy  Disposition: Follow-up with Dr. Johney Frame or me in 6-8 weeks.       Medication Adjustments/Labs and Tests Ordered: Current medicines are reviewed at length with the patient today.  Concerns regarding medicines are outlined above.  No orders of the defined types were placed in this encounter.  No orders of the defined types were placed in this encounter.   There are no Patient Instructions on file for this visit.   Signed, Deberah Pelton, NP  01/03/2021 9:51 AM      Notice: This dictation was prepared with Dragon dictation along with smaller phrase technology. Any transcriptional errors that result from this process are unintentional and may not be corrected upon review.  I spent 13 minutes examining this patient, reviewing medications, and using patient centered shared decision making involving her cardiac care.  Prior to her visit I spent greater than 20 minutes reviewing her past medical history,  medications, and prior cardiac tests.

## 2021-01-04 ENCOUNTER — Other Ambulatory Visit: Payer: Self-pay

## 2021-01-04 ENCOUNTER — Ambulatory Visit (HOSPITAL_BASED_OUTPATIENT_CLINIC_OR_DEPARTMENT_OTHER): Payer: Medicare HMO | Admitting: General Practice

## 2021-01-04 ENCOUNTER — Encounter (HOSPITAL_BASED_OUTPATIENT_CLINIC_OR_DEPARTMENT_OTHER): Payer: Self-pay | Admitting: General Practice

## 2021-01-04 ENCOUNTER — Ambulatory Visit (INDEPENDENT_AMBULATORY_CARE_PROVIDER_SITE_OTHER): Payer: Medicare HMO

## 2021-01-04 VITALS — BP 138/60 | HR 70 | Ht 65.5 in | Wt 148.0 lb

## 2021-01-04 DIAGNOSIS — E782 Mixed hyperlipidemia: Secondary | ICD-10-CM | POA: Diagnosis not present

## 2021-01-04 DIAGNOSIS — I739 Peripheral vascular disease, unspecified: Secondary | ICD-10-CM | POA: Diagnosis not present

## 2021-01-04 DIAGNOSIS — R072 Precordial pain: Secondary | ICD-10-CM

## 2021-01-04 DIAGNOSIS — I10 Essential (primary) hypertension: Secondary | ICD-10-CM

## 2021-01-04 DIAGNOSIS — R002 Palpitations: Secondary | ICD-10-CM

## 2021-01-04 NOTE — Patient Instructions (Signed)
Medication Instructions:  Your physician recommends that you continue on your current medications as directed. Please refer to the Current Medication list given to you today.  *If you need a refill on your cardiac medications before your next appointment, please call your pharmacy*   Lab Work: Your physician recommends that you have labs drawn today: BMET, CBC  If you have labs (blood work) drawn today and your tests are completely normal, you will receive your results only by: [place] (if you have MyChart) OR A paper copy in the mail If you have any lab test that is abnormal or we need to change your treatment, we will call you to review the results.   Testing/Procedures: Your physician has requested that you have an echocardiogram. Echocardiography is a painless test that uses sound waves to create images of your heart. It provides your doctor with information about the size and shape of your heart and how well your heart's chambers and valves are working. This procedure takes approximately one hour. There are no restrictions for this procedure.  Your physician has recommended that you wear a Zio monitor XT for 7 days.   This monitor is a medical device that records the heart's electrical activity. Doctors most often use these monitors to diagnose arrhythmias. Arrhythmias are problems with the speed or rhythm of the heartbeat. The monitor is a small device applied to your chest. You can wear one while you do your normal daily activities. While wearing this monitor if you have any symptoms to push the button and record what you felt. Once you have worn this monitor for the period of time provider prescribed (Usually 14 days), you will return the monitor device in the postage paid box. Once it is returned they will download the data collected and provide [place] with a report which the provider will then review and we will call you with those results. Important tips:  Avoid showering during  the first 24 hours of wearing the monitor. Avoid excessive sweating to help maximize wear time. Do not submerge the device, no hot tubs, and no swimming pools. Keep any lotions or oils away from the patch. After 24 hours you may shower with the patch on. Take brief showers with your back facing the shower head.  Do not remove patch once it has been placed because that will interrupt data and decrease adhesive wear time. Push the button when you have any symptoms and write down what you were feeling. Once you have completed wearing your monitor, remove and place into box which has postage paid and place in your outgoing mailbox.  If for some reason you have misplaced your box then call our office and we can provide another box and/or mail it off for you.    Follow-Up: At [facility], you and your health needs are our priority.  As part of our continuing mission to provide you with exceptional heart care, we have created designated Provider Care Teams.  These Care Teams include your primary Cardiologist (physician) and Advanced Practice Providers (APPs -  Physician Assistants and Nurse Practitioners) who all work together to provide you with the care you need, when you need it.  We recommend signing up for the patient portal called "MyChart".  Sign up information is provided on this After Visit Summary.  MyChart is used to connect with patients for Virtual Visits (Telemedicine).  Patients are able to view lab/test results, encounter notes, upcoming appointments, etc.  Non-urgent messages can be sent  to your provider as well.   To learn more about what you can do with MyChart, go to [email or web address].    Your next appointment:   6-8 week(s)  The format for your next appointment:   In Person  Provider:   [name], NP   Other Instructions Sierra Ray has recommended maintaining good hydration and physical activity.

## 2021-01-05 ENCOUNTER — Other Ambulatory Visit: Payer: Medicare HMO

## 2021-01-05 ENCOUNTER — Other Ambulatory Visit: Payer: Self-pay | Admitting: Podiatrist

## 2021-01-05 DIAGNOSIS — M205X1 Other deformities of toe(s) (acquired), right foot: Secondary | ICD-10-CM

## 2021-01-05 DIAGNOSIS — M205X2 Other deformities of toe(s) (acquired), left foot: Secondary | ICD-10-CM

## 2021-01-05 LAB — BASIC METABOLIC PANEL
BUN/Creatinine Ratio: 22 (ref 12–28)
BUN: 20 mg/dL (ref 8–27)
CO2: 28 mmol/L (ref 20–29)
Calcium: 9.8 mg/dL (ref 8.7–10.3)
Chloride: 101 mmol/L (ref 96–106)
Creatinine, Ser: 0.9 mg/dL (ref 0.57–1.00)
Glucose: 105 mg/dL — ABNORMAL HIGH (ref 65–99)
Potassium: 4.4 mmol/L (ref 3.5–5.2)
Sodium: 142 mmol/L (ref 134–144)
eGFR: 68 mL/min/{1.73_m2} (ref >59)

## 2021-01-05 LAB — CBC
Hematocrit: 42.6 % (ref 34.0–46.6)
Hemoglobin: 14.2 g/dL (ref 11.1–15.9)
MCH: 28.6 pg (ref 26.6–33.0)
MCHC: 33.3 g/dL (ref 31.5–35.7)
MCV: 86 fL (ref 79–97)
Platelets: 167 10*3/uL (ref 150–450)
RBC: 4.96 x10E6/uL (ref 3.77–5.28)
RDW: 13.5 % (ref 11.7–15.4)
WBC: 5.6 10*3/uL (ref 3.4–10.8)

## 2021-01-11 DIAGNOSIS — H401132 Primary open-angle glaucoma, bilateral, moderate stage: Secondary | ICD-10-CM | POA: Diagnosis not present

## 2021-01-11 DIAGNOSIS — Z961 Presence of intraocular lens: Secondary | ICD-10-CM | POA: Diagnosis not present

## 2021-01-11 DIAGNOSIS — H26492 Other secondary cataract, left eye: Secondary | ICD-10-CM | POA: Diagnosis not present

## 2021-01-11 DIAGNOSIS — H43811 Vitreous degeneration, right eye: Secondary | ICD-10-CM | POA: Diagnosis not present

## 2021-01-11 DIAGNOSIS — E119 Type 2 diabetes mellitus without complications: Secondary | ICD-10-CM | POA: Diagnosis not present

## 2021-01-11 DIAGNOSIS — H16223 Keratoconjunctivitis sicca, not specified as Sjogren's, bilateral: Secondary | ICD-10-CM | POA: Diagnosis not present

## 2021-01-11 DIAGNOSIS — H25811 Combined forms of age-related cataract, right eye: Secondary | ICD-10-CM | POA: Diagnosis not present

## 2021-01-11 LAB — HM DIABETES EYE EXAM

## 2021-01-12 ENCOUNTER — Encounter: Payer: Self-pay | Admitting: *Deleted

## 2021-01-13 DIAGNOSIS — R072 Precordial pain: Secondary | ICD-10-CM | POA: Diagnosis not present

## 2021-01-13 DIAGNOSIS — R002 Palpitations: Secondary | ICD-10-CM | POA: Diagnosis not present

## 2021-01-17 ENCOUNTER — Ambulatory Visit (INDEPENDENT_AMBULATORY_CARE_PROVIDER_SITE_OTHER): Payer: Medicare HMO

## 2021-01-17 ENCOUNTER — Other Ambulatory Visit: Payer: Self-pay

## 2021-01-17 DIAGNOSIS — R002 Palpitations: Secondary | ICD-10-CM

## 2021-01-17 DIAGNOSIS — R072 Precordial pain: Secondary | ICD-10-CM | POA: Diagnosis not present

## 2021-01-17 LAB — ECHOCARDIOGRAM COMPLETE
Area-P 1/2: 2.43 cm2
Calc EF: 68.4 %
S' Lateral: 1.99 cm
Single Plane A2C EF: 69.1 %
Single Plane A4C EF: 67.1 %

## 2021-01-18 ENCOUNTER — Telehealth: Payer: Self-pay | Admitting: *Deleted

## 2021-01-18 DIAGNOSIS — R079 Chest pain, unspecified: Secondary | ICD-10-CM | POA: Diagnosis not present

## 2021-01-18 DIAGNOSIS — K5900 Constipation, unspecified: Secondary | ICD-10-CM | POA: Diagnosis not present

## 2021-01-18 DIAGNOSIS — Z86010 Personal history of colonic polyps: Secondary | ICD-10-CM | POA: Diagnosis not present

## 2021-01-18 NOTE — Telephone Encounter (Signed)
   Patient Name: Sierra Ray Clayton Bibles  DOB: 1945-08-17 MRN: [id number]  Primary Cardiologist: Freada Bergeron, MD  Chart reviewed as part of pre-operative protocol coverage. Per review with [name], NP, who recently evaluated patient, from a cardiac standpoint she may have her colonoscopy. Will route this bundled recommendation to requesting provider via Epic fax function. Please call with questions.  Charlie Pitter, PA-C 01/18/2021, 3:34 PM

## 2021-01-18 NOTE — Telephone Encounter (Signed)
   [facility] Pre-operative Risk Assessment    Patient Name: Sierra Batts V.  DOB: Aug 08, 1945 MRN: [id number]  HEARTCARE STAFF:  - IMPORTANT!!!!!! Under Visit Info/Reason for Call, type in Other and utilize the format Clearance MM/DD/YY or Clearance TBD. Do not use dashes or single digits. - Please review there is not already an duplicate clearance open for this procedure. - If request is for dental extraction, please clarify the # of teeth to be extracted. - If the patient is currently at the dentist's office, call Pre-Op Callback Staff (MA/nurse) to input urgent request.  - If the patient is not currently in the dentist office, please route to the Pre-Op pool.  Request for surgical clearance:  What type of surgery is being performed?  COLONOSCOPY   When is this surgery scheduled?  TBD  What type of clearance is required (medical clearance vs. Pharmacy clearance to hold med vs. Both)?  MEDICAL  Are there any medications that need to be held prior to surgery and how long?  N/A  Practice name and name of physician performing surgery?  [facility] [place] / DR. HUNG  What is the office phone number?  [phone]   7.   What is the office fax number?  [phone]  8.   Anesthesia type (None, local, MAC, general) ?  PROPOFOL   Sierra Ray 01/18/2021, 1:01 PM  _________________________________________________________________   (provider comments below)

## 2021-01-18 NOTE — Telephone Encounter (Addendum)
   Patient Name: Sierra Ray  DOB: 06-29-1945 MRN: [id number]  Primary Cardiologist: Freada Bergeron, MD  Chart reviewed as part of pre-operative protocol coverage. Patient just recently saw [name], NP, on 01/04/21 with various complaints - weakness, DOE, fatigue, dizziness amongst other issuees outlined. Monitor and echo were ordered, results available in EMR finalized yesterday. Echo with normal LVEF, grade 1 DD.  Monitor showed 28 runs of SVT, awaiting further result note. Will route to [place] to find out if he feels patient may be cleared for colonoscopy without further workup or needs additional intervention regarding monitor.  Charlie Pitter, PA-C 01/18/2021, 1:59 PM

## 2021-01-25 ENCOUNTER — Other Ambulatory Visit: Payer: Self-pay | Admitting: *Deleted

## 2021-01-25 DIAGNOSIS — E1142 Type 2 diabetes mellitus with diabetic polyneuropathy: Secondary | ICD-10-CM

## 2021-01-25 MED ORDER — ACCU-CHEK SOFTCLIX LANCETS MISC: 1 refills | Status: DC

## 2021-01-25 NOTE — Telephone Encounter (Signed)
[facility] requested refill.  

## 2021-01-30 ENCOUNTER — Other Ambulatory Visit: Payer: Medicare HMO

## 2021-01-30 ENCOUNTER — Other Ambulatory Visit: Payer: Self-pay

## 2021-01-30 DIAGNOSIS — E1159 Type 2 diabetes mellitus with other circulatory complications: Secondary | ICD-10-CM

## 2021-01-30 DIAGNOSIS — E1169 Type 2 diabetes mellitus with other specified complication: Secondary | ICD-10-CM

## 2021-01-30 DIAGNOSIS — I152 Hypertension secondary to endocrine disorders: Secondary | ICD-10-CM

## 2021-01-30 DIAGNOSIS — E1142 Type 2 diabetes mellitus with diabetic polyneuropathy: Secondary | ICD-10-CM | POA: Diagnosis not present

## 2021-01-30 DIAGNOSIS — E785 Hyperlipidemia, unspecified: Secondary | ICD-10-CM

## 2021-01-31 LAB — COMPLETE METABOLIC PANEL WITH GFR
AG Ratio: 1.5 (calc) (ref 1.0–2.5)
ALT: 11 U/L (ref 6–29)
AST: 16 U/L (ref 10–35)
Albumin: 4.1 g/dL (ref 3.6–5.1)
Alkaline phosphatase (APISO): 52 U/L (ref 37–153)
BUN: 23 mg/dL (ref 7–25)
CO2: 26 mmol/L (ref 20–32)
Calcium: 9.4 mg/dL (ref 8.6–10.4)
Chloride: 103 mmol/L (ref 98–110)
Creat: 0.88 mg/dL (ref 0.60–1.00)
Globulin: 2.8 g/dL (calc) (ref 1.9–3.7)
Glucose, Bld: 101 mg/dL — ABNORMAL HIGH (ref 65–99)
Potassium: 4.4 mmol/L (ref 3.5–5.3)
Sodium: 139 mmol/L (ref 135–146)
Total Bilirubin: 0.4 mg/dL (ref 0.2–1.2)
Total Protein: 6.9 g/dL (ref 6.1–8.1)
eGFR: 69 mL/min/{1.73_m2} (ref >=60)

## 2021-01-31 LAB — LIPID PANEL
Cholesterol: 133 mg/dL (ref <200)
HDL: 67 mg/dL (ref >=50)
LDL Cholesterol (Calc): 48 mg/dL (calc)
Non-HDL Cholesterol (Calc): 66 mg/dL (calc) (ref <130)
Total CHOL/HDL Ratio: 2 (calc) (ref <5.0)
Triglycerides: 97 mg/dL (ref <150)

## 2021-01-31 LAB — TSH: TSH: 1.85 mIU/L (ref 0.40–4.50)

## 2021-01-31 LAB — HEMOGLOBIN A1C
Hgb A1c MFr Bld: 6.7 % of total Hgb — ABNORMAL HIGH (ref <5.7)
Mean Plasma Glucose: 146 mg/dL
eAG (mmol/L): 8.1 mmol/L

## 2021-02-02 ENCOUNTER — Telehealth: Payer: Self-pay | Admitting: General Practice

## 2021-02-02 NOTE — Telephone Encounter (Signed)
Patient reviewed her echo results, but she noticed NP notes mentioned to continue on medication regimen. Patient states she doesn't take any medications and she would like to know whether or not she should be put on any medication. Please advise.

## 2021-02-02 NOTE — Telephone Encounter (Signed)
Deberah Pelton, NP  01/18/2021  2:43 PM EDT     Please contact [name] and let her know that her cardiac event monitor is been reviewed.  It showed mainly sinus rhythm with an average heart rate of 74 bpm.  She was noted to have a short burst of SVT with rare PVCs and PACs.  We will continue with her current medication regimen at this time.  No further testing is needed.  Thank you.     ECHOCARDIOGRAM COMPLETE: Result Notes          Deberah Pelton, NP  01/17/2021  4:26 PM EDT     Please contact Ms. Sierra Danser V.  And let her know that her echocardiogram has been reviewed.  She was noted to have normal pumping function and grade 1 diastolic dysfunction/impaired relaxation.  Her mitral valve showed trace mitral valve regurgitation and no other significant valvular abnormalities were noted.  We will continue with her current medication regimen.  No further testing is needed at this time.  Thank you.    The patient has been notified of the result and verbalized understanding.  All questions (if any) were answered. Sierra Alpha, LPN 79/53/6922 3:00 AM    She was advised to continue her current med regimen and follow-up as planned.  Pt verbalized understanding and agrees with this plan.

## 2021-02-03 ENCOUNTER — Other Ambulatory Visit: Payer: Self-pay

## 2021-02-03 ENCOUNTER — Encounter: Payer: Self-pay | Admitting: Family

## 2021-02-03 ENCOUNTER — Ambulatory Visit (INDEPENDENT_AMBULATORY_CARE_PROVIDER_SITE_OTHER): Payer: Medicare HMO | Admitting: Family

## 2021-02-03 VITALS — BP 120/73 | HR 67 | Temp 97.7°F | Resp 18 | Ht 65.5 in | Wt 146.8 lb

## 2021-02-03 DIAGNOSIS — E1159 Type 2 diabetes mellitus with other circulatory complications: Secondary | ICD-10-CM

## 2021-02-03 DIAGNOSIS — K219 Gastro-esophageal reflux disease without esophagitis: Secondary | ICD-10-CM

## 2021-02-03 DIAGNOSIS — I152 Hypertension secondary to endocrine disorders: Secondary | ICD-10-CM | POA: Diagnosis not present

## 2021-02-03 DIAGNOSIS — E1169 Type 2 diabetes mellitus with other specified complication: Secondary | ICD-10-CM

## 2021-02-03 DIAGNOSIS — E1142 Type 2 diabetes mellitus with diabetic polyneuropathy: Secondary | ICD-10-CM | POA: Diagnosis not present

## 2021-02-03 DIAGNOSIS — E785 Hyperlipidemia, unspecified: Secondary | ICD-10-CM | POA: Diagnosis not present

## 2021-02-03 NOTE — Progress Notes (Signed)
Provider: [name]   Sierra Ray, [name], NP  Patient Care Team: Jaziya Obarr, [name], NP as PCP - General (Family Medicine) Freada Bergeron, MD as PCP - Cardiology (Cardiology) Dorothy Spark, MD (Inactive) as Consulting Physician (Cardiology) Clent Jacks, MD as Consulting Physician (Ophthalmology)  Extended Emergency Contact Information Primary Emergency Contact: [name],Mark Address: [place], [place] Phone: [phone] Relation: Son  Code Status:  Full Code  Goals of care: Advanced Directive information Advanced Directives 02/03/2021  Does Patient Have a Medical Advance Directive? Yes  Type of Advance Directive Living will  Does patient want to make changes to medical advance directive? No - Patient declined  Copy of [place] in Chart? -     Chief Complaint  Patient presents with   Medical Management of Chronic Issues    6 month follow up.   Health Maintenance    COVID booster,tetanus/tdap    HPI:  Pt is a 75 y.o. female seen today for  6 months follow up for medical management of chronic diseases. Has medical history as below. States blood pressure at home varies.brought B/p log to visit. Readings ranging in the 100's/50's  - 130's /60's . Has some heart flutter and missed beat .Has weakness whenever she takes her metoprolol 25 mg tablet.No dizziness or syncope.  No CBG log for evaluation.denies any hypo/hyperglycemia symptoms.discussed to check CBG at least once daily and record.   Constipation - has soft stool but are small like rocks. Strains sometimes. Has a colonscopy schedule November ,1st 2022  Due for COVID-19 booster and Tetanus vaccine   Past Medical History:  Diagnosis Date   Abnormal Pap smear 05/08/2011   ASC-US +HPV   Arthritis    Asthma    Constipation    Diabetes mellitus type 2 with neurological manifestations ([place])    Diabetic peripheral neuropathy  (HCC)    Generalized osteoarthritis    GERD (gastroesophageal reflux disease)    Hx of seasonal allergies    Hyperlipidemia    Hypertension    IBS (irritable bowel syndrome)    S/P total hysterectomy 06/21/2011   Past Surgical History:  Procedure Laterality Date   CATARACT EXTRACTION Left    EYE SURGERY Right 01/2018   Removed raised area on eye. Dr.Groat    OVARIAN CYST SURGERY  1986   PELVIC LAPAROSCOPY     TONSILECTOMY, ADENOIDECTOMY, BILATERAL MYRINGOTOMY AND TUBES  1974   VAGINAL HYSTERECTOMY  1983    Allergies  Allergen Reactions   Aspirin    Pravastatin Sodium Other (See Comments)    Muscle aches   Restasis [Cyclosporine] Other (See Comments)    Allergies as of 02/03/2021       Reactions   Aspirin    Pravastatin Sodium Other (See Comments)   Muscle aches   Restasis [cyclosporine] Other (See Comments)        Medication List        Accurate as of February 03, 2021  1:11 PM. If you have any questions, ask your nurse or doctor.          STOP taking these medications    Lumigan 0.01 % Soln Generic drug: bimatoprost Stopped by: [name] Zakry Caso, NP   prednisoLONE acetate 1 % ophthalmic suspension Commonly known as: PRED FORTE Stopped by: [name] Woodley Petzold, NP   senna-docusate 8.6-50 MG tablet Commonly known as: Senokot-S Stopped  by: [name], NP       TAKE these medications    Accu-Chek Aviva Plus test strip Generic drug: glucose blood USE TO TEST BLOOD SUGAR TWICE DAILY   Accu-Chek Aviva Plus w/Device Kit Use to test blood sugar twice daily. Dx E11.42   Accu-Chek Softclix Lancet Dev Kit Use to test blood sugar. Dx: E11.42   Accu-Chek Softclix Lancets lancets Use to test blood sugar one time daily for diabetes. Dx:E11.42   Align 4 MG Caps Take 1 capsule by mouth daily.   amLODipine 5 MG tablet Commonly known as: NORVASC Take 5 mg by mouth daily. 1/2 tablet (2.20m)   b complex vitamins capsule Take 1 capsule by mouth daily.    cholecalciferol 1000 units tablet Commonly known as: VITAMIN D Take 1,000 Units by mouth daily.   Fish Oil 1000 MG Caps Take 2 capsules by mouth 2 (two) times a week.   fluticasone 50 MCG/ACT nasal spray Commonly known as: FLONASE Place 2 sprays into both nostrils as needed.   latanoprost 0.005 % ophthalmic solution Commonly known as: XALATAN 1 drop at bedtime.   lisinopril 2.5 MG tablet Commonly known as: Zestril Take 1 tablet (2.5 mg total) by mouth daily.   loratadine 10 MG tablet Commonly known as: CLARITIN Take 10 mg by mouth daily as needed.   metFORMIN 500 MG tablet Commonly known as: GLUCOPHAGE TAKE 2 TABLETS EVERY DAY WITH BREAKFAST   metoprolol tartrate 25 MG tablet Commonly known as: LOPRESSOR Take 0.5 tablets (12.5 mg total) by mouth 2 (two) times daily as needed (palpitations).   multivitamin-lutein Caps capsule Take 1 capsule by mouth daily.   multivitamin with minerals tablet Take 1 tablet by mouth daily.   polyethylene glycol 17 g packet Commonly known as: MIRALAX / GLYCOLAX Take 17 g by mouth daily as needed. For constipation   rosuvastatin 5 MG tablet Commonly known as: CRESTOR TAKE 1 TABLET EVERY DAY   SYSTANE COMPLETE OP Place 1 drop into both eyes in the morning, at noon, in the evening, and at bedtime.   traZODone 50 MG tablet Commonly known as: DESYREL Take 1/2 to 1 tablet by mouth once daily as needed        Review of Systems  Constitutional:  Negative for appetite change, chills, fatigue, fever and unexpected weight change.  HENT:  Negative for congestion, dental problem, ear discharge, ear pain, facial swelling, hearing loss, nosebleeds, postnasal drip, rhinorrhea, sinus pressure, sinus pain, sneezing, sore throat, tinnitus and trouble swallowing.   Eyes:  Positive for visual disturbance. Negative for pain, discharge, redness and itching.       Follows up with Dr.Rankin   Respiratory:  Negative for cough, chest tightness,  shortness of breath and wheezing.   Cardiovascular:  Negative for chest pain, palpitations and leg swelling.  Gastrointestinal:  Negative for abdominal distention, abdominal pain, blood in stool, constipation, diarrhea, nausea and vomiting.  Endocrine: Negative for cold intolerance, heat intolerance, polydipsia, polyphagia and polyuria.  Genitourinary:  Negative for difficulty urinating, dysuria, flank pain, frequency and urgency.  Musculoskeletal:  Positive for gait problem. Negative for arthralgias, back pain, joint swelling, myalgias, neck pain and neck stiffness.  Skin:  Negative for color change, pallor, rash and wound.  Neurological:  Negative for dizziness, syncope, speech difficulty, light-headedness, numbness and headaches.       Generalized weakness when she takes Metoprolol   Hematological:  Does not bruise/bleed easily.  Psychiatric/Behavioral:  Negative for agitation, behavioral problems, confusion, hallucinations and  sleep disturbance. The patient is not nervous/anxious.    Immunization History  Administered Date(s) Administered   Fluad Quad(high Dose 65+) 02/11/2019, 02/12/2020, 01/27/2021   Influenza, High Dose Seasonal PF 01/28/2018   Influenza,inj,Quad PF,6+ Mos 02/03/2016, 01/23/2017   Moderna Sars-Covid-2 Vaccination 06/22/2019, 07/24/2019   Pneumococcal Conjugate-13 07/30/2014   Pneumococcal Polysaccharide-23 11/17/2005, 05/20/2012   Td 08/18/2010   Pertinent  Health Maintenance Due  Topic Date Due   FOOT EXAM  01/23/2018   HEMOGLOBIN A1C  07/31/2021   MAMMOGRAM  11/05/2021   OPHTHALMOLOGY EXAM  01/11/2022   COLONOSCOPY (Pts 45-60yr Insurance coverage will need to be confirmed)  09/05/2025   INFLUENZA VACCINE  Completed   DEXA SCAN  Completed   Fall Risk  02/03/2021 12/27/2020 12/06/2020 08/04/2020 02/12/2020  Falls in the past year? 0 0 0 0 0  Comment - - - - -  Number falls in past yr: 0 0 0 0 0  Injury with Fall? 0 0 0 0 0  Risk for fall due to : No Fall  Risks No Fall Risks No Fall Risks - -  Follow up Falls evaluation completed Falls evaluation completed Falls evaluation completed - -   Functional Status Survey:    Vitals:   02/03/21 1256  BP: 120/73  Pulse: 67  Resp: 18  Temp: 97.7 F (36.5 C)  SpO2: 98%  Weight: 146 lb 12.8 oz (66.6 kg)  Height: 5' 5.5" (1.664 m)   Body mass index is 24.06 kg/m. Physical Exam Vitals reviewed.  Constitutional:      General: She is not in acute distress.    Appearance: Normal appearance. She is normal weight. She is not ill-appearing or diaphoretic.  HENT:     Head: Normocephalic.     Right Ear: Tympanic membrane, ear canal and external ear normal. There is no impacted cerumen.     Left Ear: Tympanic membrane, ear canal and external ear normal. There is no impacted cerumen.     Nose: Nose normal. No congestion or rhinorrhea.     Mouth/Throat:     Mouth: Mucous membranes are moist.     Pharynx: Oropharynx is clear. No oropharyngeal exudate or posterior oropharyngeal erythema.  Eyes:     General: No scleral icterus.       Right eye: No discharge.        Left eye: No discharge.     Extraocular Movements: Extraocular movements intact.     Conjunctiva/sclera: Conjunctivae normal.     Pupils: Pupils are equal, round, and reactive to light.     Comments: Corrective lens   Neck:     Vascular: No carotid bruit.  Cardiovascular:     Rate and Rhythm: Normal rate and regular rhythm.     Pulses: Normal pulses.     Heart sounds: Normal heart sounds. No murmur heard.   No friction rub. No gallop.  Pulmonary:     Effort: Pulmonary effort is normal. No respiratory distress.     Breath sounds: Normal breath sounds. No wheezing, rhonchi or rales.  Chest:     Chest wall: No tenderness.  Abdominal:     General: Bowel sounds are normal. There is no distension.     Palpations: Abdomen is soft. There is no mass.     Tenderness: There is no abdominal tenderness. There is no right CVA tenderness, left  CVA tenderness, guarding or rebound.  Musculoskeletal:        General: No swelling or tenderness. Normal range of motion.  Cervical back: Normal range of motion. No rigidity or tenderness.     Right lower leg: No edema.     Left lower leg: No edema.     Comments: Unsteady gait uses a cane   Lymphadenopathy:     Cervical: No cervical adenopathy.  Skin:    General: Skin is warm and dry.     Coloration: Skin is not pale.     Findings: No bruising, erythema, lesion or rash.  Neurological:     Mental Status: She is alert and oriented to person, place, and time.     Cranial Nerves: No cranial nerve deficit.     Sensory: No sensory deficit.     Motor: No weakness.     Coordination: Coordination normal.     Gait: Gait normal.  Psychiatric:        Mood and Affect: Mood normal.        Speech: Speech normal.        Behavior: Behavior normal.        Thought Content: Thought content normal.        Judgment: Judgment normal.    Labs reviewed: Recent Labs    08/01/20 0807 01/04/21 1043 01/30/21 0827  NA 142 142 139  K 4.2 4.4 4.4  CL 104 101 103  CO2 _0 GLUCOSE 126* 105* 101*  BUN _1 CREATININE 0.92 0.90 0.88  CALCIUM 9.4 9.8 9.4   Recent Labs    02/08/20 0805 08/01/20 0807 01/30/21 0827  AST _2 ALT _3 BILITOT 0.5 0.5 0.4  PROT 7.2 7.3 6.9   Recent Labs    02/08/20 0805 08/01/20 0807 01/04/21 1043  WBC 5.3 5.0 5.6  NEUTROABS 1,685 1,900  --   HGB 14.3 14.5 14.2  HCT 43.0 43.8 42.6  MCV 86.9 86.2 86  PLT 185 173 167   Lab Results  Component Value Date   TSH 1.85 01/30/2021   Lab Results  Component Value Date   HGBA1C 6.7 (H) 01/30/2021   Lab Results  Component Value Date   CHOL 133 01/30/2021   HDL 67 01/30/2021   LDLCALC 48 01/30/2021   TRIG 97 01/30/2021   CHOLHDL 2.0 01/30/2021    Significant Diagnostic Results in last 30 days:  ECHOCARDIOGRAM COMPLETE  Result Date: 01/17/2021    ECHOCARDIOGRAM REPORT   Patient  Name:   Sierra Ray V. Date of Exam: 01/17/2021 Medical Rec #:  [id number]              Height:       65.5 in Accession #:    [id number]             Weight:       148.0 lb Date of Birth:  [birth year]               BSA:          1.750 m Patient Age:    5 years               BP:           138/60 mmHg Patient Gender: F                      HR:           62 bpm. Exam Location:  Outpatient Procedure: 2D Echo, Strain Analysis, Color Doppler and Cardiac Doppler Indications:    R00.2 (ICD-10-CM) -  Palpitations                 R07.2 (ICD-10-CM) - Precordial pain  History:        Patient has prior history of Echocardiogram examinations, most                 recent 07/16/2017. Risk Factors:Diabetes, Hypertension,                 Dyslipidemia and Non-Smoker. GERD, PAC.  Sonographer:    Leavy Cella RDCS Referring Phys: [id number] [place]  1. Left ventricular ejection fraction, by estimation, is 65 to 70%. The left ventricle has normal function. The left ventricle has no regional wall motion abnormalities. There is mild left ventricular hypertrophy. Left ventricular diastolic parameters are consistent with Grade I diastolic dysfunction (impaired relaxation).  2. Right ventricular systolic function is normal. The right ventricular size is normal. There is normal pulmonary artery systolic pressure. The estimated right ventricular systolic pressure is 70.1 mmHg.  3. The mitral valve is abnormal. Trivial mitral valve regurgitation.  4. The aortic valve is tricuspid. Aortic valve regurgitation is not visualized.  5. The inferior vena cava is normal in size with greater than 50% respiratory variability, suggesting right atrial pressure of 3 mmHg. Comparison(s): 07/16/2017 prior EF 60%. FINDINGS  Left Ventricle: Left ventricular ejection fraction, by estimation, is 65 to 70%. The left ventricle has normal function. The left ventricle has no regional wall motion abnormalities. The left ventricular internal cavity  size was normal in size. There is  mild left ventricular hypertrophy. Left ventricular diastolic parameters are consistent with Grade I diastolic dysfunction (impaired relaxation). Indeterminate filling pressures. Right Ventricle: The right ventricular size is normal. No increase in right ventricular wall thickness. Right ventricular systolic function is normal. There is normal pulmonary artery systolic pressure. The tricuspid regurgitant velocity is 1.86 m/s, and  with an assumed right atrial pressure of 3 mmHg, the estimated right ventricular systolic pressure is 77.9 mmHg. Left Atrium: Left atrial size was normal in size. Right Atrium: Right atrial size was normal in size. Pericardium: There is no evidence of pericardial effusion. Mitral Valve: The mitral valve is abnormal. There is mild thickening of the mitral valve leaflet(s). Trivial mitral valve regurgitation. Tricuspid Valve: The tricuspid valve is grossly normal. Tricuspid valve regurgitation is trivial. Aortic Valve: The aortic valve is tricuspid. Aortic valve regurgitation is not visualized. Pulmonic Valve: The pulmonic valve was grossly normal. Pulmonic valve regurgitation is trivial. Aorta: The aortic root and ascending aorta are structurally normal, with no evidence of dilitation. Venous: The inferior vena cava is normal in size with greater than 50% respiratory variability, suggesting right atrial pressure of 3 mmHg. IAS/Shunts: No atrial level shunt detected by color flow Doppler.  LEFT VENTRICLE PLAX 2D LVIDd:         3.46 cm     Diastology LVIDs:         1.99 cm     LV e' medial:    5.44 cm/s LV PW:         1.26 cm     LV E/e' medial:  13.5 LV IVS:        1.06 cm     LV e' lateral:   8.05 cm/s LVOT diam:     2.15 cm     LV E/e' lateral: 9.1 LV SV:         78 LV SV Index:   45 LVOT Area:  3.63 cm  LV Volumes (MOD) LV vol d, MOD A2C: 51.8 ml LV vol d, MOD A4C: 72.3 ml LV vol s, MOD A2C: 16.0 ml LV vol s, MOD A4C: 23.8 ml LV SV MOD A2C:     35.8  ml LV SV MOD A4C:     72.3 ml LV SV MOD BP:      43.4 ml RIGHT VENTRICLE RV Basal diam:  4.01 cm RV Mid diam:    3.25 cm RV S prime:     11.30 cm/s TAPSE (M-mode): 1.8 cm LEFT ATRIUM             Index       RIGHT ATRIUM           Index LA diam:        3.20 cm 1.83 cm/m  RA Area:     12.00 cm LA Vol (A2C):   46.2 ml 26.39 ml/m RA Volume:   27.40 ml  15.65 ml/m LA Vol (A4C):   35.9 ml 20.51 ml/m LA Biplane Vol: 41.1 ml 23.48 ml/m  AORTIC VALVE             PULMONIC VALVE LVOT Vmax:   84.60 cm/s  PR End Diast Vel: 5.20 msec LVOT Vmean:  61.600 cm/s LVOT VTI:    0.215 m  AORTA Ao Root diam: 3.20 cm Ao Asc diam:  2.80 cm MITRAL VALVE               TRICUSPID VALVE MV Area (PHT): 2.43 cm    TR Peak grad:   13.8 mmHg MV Decel Time: 312 msec    TR Vmax:        186.00 cm/s MV E velocity: 73.40 cm/s MV A velocity: 84.90 cm/s  SHUNTS MV E/A ratio:  0.86        Systemic VTI:  0.22 m                            Systemic Diam: 2.15 cm Lyman Bishop MD Electronically signed by Lyman Bishop MD Signature Date/Time: 01/17/2021/4:05:11 PM    Final    LONG TERM MONITOR (3-14 DAYS)  Result Date: 01/18/2021 Patch Wear Time:  7 days and 3 hours (2022-09-21T10:34:05-398 to 2022-09-28T14:08:52-0400) Predominant rhythm:   SInus rhythm   Rate 48 to 128 bpm  Average HR 74 bpm    Short burst of SVT longest 18 beats at 117 bpm; fastest 4 beats at 184 bpm    Rare PVCs, PACs   No triggered events     Assessment/Plan 1. Type 2 diabetes mellitus with diabetic polyneuropathy, without long-term current use of insulin (HCC) Lab Results  Component Value Date   HGBA1C 6.7 (H) 01/30/2021  No home CBG log for evaluation  - continue on Metformin  - continue on ASA and Statin for cardiac event prophylaxis - on ACE inhibitor for renal protection  - up to date on annual eye.Has follow up appointment for foot exam  - TSH; Future - Hemoglobin A1c; Future  2. Hyperlipidemia associated with type 2 diabetes mellitus (HCC) LDL at goal   Continue on Rosuvastatin  - Lipid panel; Future  3. Hypertension associated with diabetes ([place]) B/p well controlled Will reduce Metoprolol from 25 mg tablet to 12.5 mg tablet twice daily as needed  - CBC with Differential/Platelet; Future - CMP with eGFR([facility]); Future  4. Gastroesophageal reflux disease without esophagitis Stable  No signs of GI bleed reported -  CBC with Differential/Platelet; Future  Family/ staff Communication: Reviewed plan of care with patient verbalized understanding   Labs/tests ordered:  - CBC with Differential/Platelet - CMP with eGFR([facility]) - TSH - Hgb A1C - Lipid panel  Next Appointment : 6 months for medical management of chronic issues.Fasting Labs prior to visit.    [name], NP

## 2021-02-14 NOTE — Progress Notes (Signed)
Cardiology Office Note:    Date:  02/15/2021   ID:  Sierra Martos V., DOB [birth year], MRN [id number]  PCP:  [name], NP   [facility] Providers Cardiologist:  Freada Bergeron, MD      Referring MD: [name], NP   Follow-up for palpitations and lower extremity claudication  History of Present Illness:    Sierra Worst V. is a 75 y.o. female with a hx of DOE, precordial pain, palpitations, essential hypertension, HLD.  She had a nuclear stress test in 2015 that showed normal EF and no ischemia.  She wore a cardiac event monitor which showed sinus bradycardia, sinus rhythm, PAC, and no sustained arrhythmias.  She has previously mentioned chest pain which was felt to be costochondritis.  Anti-inflammatory medication was recommended.   She followed up 08/27/2019 with Dr. [name].  During that time she reported that her palpitations were almost gone.  She did notice occasional episodes of palpitations.  They were not associated with chest pain, dizziness, shortness of breath, or syncope.  She continued to have right-sided chest pain at rest and with exertion.  She continued to be very physically active and was walking 3 times per week.  She did report some cramping in her lower extremities.  Lower extremity ABIs were ordered and were normal bilaterally.   She presented to the clinic 01/04/21 for follow-up evaluation stated over the last 6 months she had noticed dizziness, increased weakness, and dyspnea with exertion.  She reported that her symptoms had slowly started to improve.  She denied bleeding issues.  She had been staying well-hydrated.  She continued to walk 3 days/week, use a stationary pedaling machine, and generally stayed very physically active.  She had been watching her diet and had lost around 20 pounds over the last 6 months.  This was achieved through reducing her caloric intake and maintaining her physical activity.  She continued to notice a dull  and fuzzy type sensation in her left chest.  She reported that the symptoms came on at rest but she did not typically notice them with increased physical activity.  We ordered a CBC, BMP, 7-day cardiac event monitor, and echocardiogram.  I asked her to maintain her physical activity and diet.  We planned  follow-up in 6 to 8 weeks.  Her echocardiogram showed LVEF 65-70%, G1 DD, trivial mitral valve regurgitation and no other significant valvular abnormalities.  Her cardiac event monitor showed sinus rhythm, short burst of SVT and rare PVCs with/PACs.  She presents the clinic today for follow-up evaluation and states she feels well.  We reviewed her cardiac event monitor and echocardiogram results.  She expressed understanding.  She continues to have episodes of intermittent cramping after bouts of physical activity.  She remains very physically active walking several times per week and using her stationary bike.  I will continue her current medication regimen, continue her physical activity, and Continue her current diet.  Recent labs from her PCP.  I have plan follow-up for 6 months with Dr. Johney Frame.   Today she denies chest pain, increased shortness of breath, lower extremity edema, fatigue, palpitations, melena, hematuria, hemoptysis, diaphoresis, weakness, presyncope, syncope, orthopnea, and PND.  Past Medical History:  Diagnosis Date   Abnormal Pap smear 05/08/2011   ASC-US +HPV   Arthritis    Asthma    Constipation    Diabetes mellitus type 2 with neurological manifestations (HCC)    Diabetic peripheral neuropathy (HCC)    Generalized  osteoarthritis    GERD (gastroesophageal reflux disease)    Hx of seasonal allergies    Hyperlipidemia    Hypertension    IBS (irritable bowel syndrome)    S/P total hysterectomy 06/21/2011    Past Surgical History:  Procedure Laterality Date   CATARACT EXTRACTION Left    EYE SURGERY Right 01/2018   Removed raised area on eye. Dr.Groat    OVARIAN  CYST SURGERY  1986   PELVIC LAPAROSCOPY     TONSILECTOMY, ADENOIDECTOMY, BILATERAL MYRINGOTOMY AND TUBES  1974   VAGINAL HYSTERECTOMY  1983    Current Medications: Current Meds  Medication Sig   ACCU-CHEK AVIVA PLUS test strip USE TO TEST BLOOD SUGAR TWICE DAILY   Accu-Chek Softclix Lancets lancets Use to test blood sugar one time daily for diabetes. Dx:E11.42   amLODipine (NORVASC) 5 MG tablet Take 5 mg by mouth daily. 1/2 tablet (2.63m)   b complex vitamins capsule Take 1 capsule by mouth daily.   Blood Glucose Monitoring Suppl (ACCU-CHEK AVIVA PLUS) w/Device KIT Use to test blood sugar twice daily. Dx E11.42   cholecalciferol (VITAMIN D) 1000 UNITS tablet Take 1,000 Units by mouth daily.   fluticasone (FLONASE) 50 MCG/ACT nasal spray Place 2 sprays into both nostrils as needed.    Lancets Misc. (ACCU-CHEK SOFTCLIX LANCET DEV) KIT Use to test blood sugar. Dx: E11.42   latanoprost (XALATAN) 0.005 % ophthalmic solution 1 drop at bedtime.   lisinopril (ZESTRIL) 2.5 MG tablet Take 1 tablet (2.5 mg total) by mouth daily.   loratadine (CLARITIN) 10 MG tablet Take 10 mg by mouth daily as needed.   metFORMIN (GLUCOPHAGE) 500 MG tablet TAKE 2 TABLETS EVERY DAY WITH BREAKFAST   metoprolol tartrate (LOPRESSOR) 25 MG tablet Take 0.5 tablets (12.5 mg total) by mouth 2 (two) times daily as needed (palpitations).   Multiple Vitamins-Minerals (MULTIVITAMIN WITH MINERALS) tablet Take 1 tablet by mouth daily.   multivitamin-lutein (OCUVITE-LUTEIN) CAPS capsule Take 1 capsule by mouth daily.   Omega-3 Fatty Acids (FISH OIL) 1000 MG CAPS Take 2 capsules by mouth 2 (two) times a week.    polyethylene glycol (MIRALAX / GLYCOLAX) packet Take 17 g by mouth daily as needed. For constipation   Probiotic Product (ALIGN) 4 MG CAPS Take 1 capsule by mouth daily.    Propylene Glycol (SYSTANE COMPLETE OP) Place 1 drop into both eyes in the morning, at noon, in the evening, and at bedtime.   rosuvastatin (CRESTOR) 5  MG tablet TAKE 1 TABLET EVERY DAY   traZODone (DESYREL) 50 MG tablet Take 1/2 to 1 tablet by mouth once daily as needed     Allergies:   Aspirin, Pravastatin sodium, and Restasis [cyclosporine]   Social History   Socioeconomic History   Marital status: Single    Spouse name: Not on file   Number of children: 4   Years of education: Not on file   Highest education level: Not on file  Occupational History    Employer: RETIRED  Tobacco Use   Smoking status: Never   Smokeless tobacco: Never  Vaping Use   Vaping Use: Never used  Substance and Sexual Activity   Alcohol use: No   Drug use: No   Sexual activity: Not Currently    Birth control/protection: Surgical    Comment: 1st intercourse- 20, partners- 868   Other Topics Concern   Not on file  Social History Narrative   Diet?    Low fat, low carb, low salt  Do you drink/eat things with caffeine?      Marital status?  single                                  What year were you married?      Do you live in a house, apartment, assisted living, condo, trailer, etc.?      Is it one or more stories?      How many persons live in your home?      Do you have any pets in your home? (please list)      Current or past profession:      Do you exercise?   yes                 Type & how often? Yoga, water aerobics 1-2x/week      Do you have a living will?      Do you have a DNR form?                                  If not, do you want to discuss one?      Do you have signed POA/HPOA for forms?       Social Determinants of Health   Financial Resource Strain: Not on file  Food Insecurity: Not on file  Transportation Needs: Not on file  Physical Activity: Not on file  Stress: Not on file  Social Connections: Not on file     Family History: The patient's family history includes Breast cancer in her cousin and maternal aunt; Cancer in her mother and paternal grandmother; Diabetes in her father and mother; Heart disease in  her father; Pancreatic cancer in her brother and sister.  ROS:   Please see the history of present illness.     All other systems reviewed and are negative.   Risk Assessment/Calculations:           Physical Exam:    VS:  BP 110/64   Pulse 76   Ht _0  (1.676 m)   Wt 145 lb 12.8 oz (66.1 kg)   SpO2 96%   BMI 23.53 kg/m     Wt Readings from Last 3 Encounters:  02/15/21 145 lb 12.8 oz (66.1 kg)  02/03/21 146 lb 12.8 oz (66.6 kg)  01/04/21 148 lb (67.1 kg)     GEN:  Well nourished, well developed in no acute distress HEENT: Normal NECK: No JVD; No carotid bruits LYMPHATICS: No lymphadenopathy CARDIAC: RRR, no murmurs, rubs, gallops RESPIRATORY:  Clear to auscultation without rales, wheezing or rhonchi  ABDOMEN: Soft, non-tender, non-distended MUSCULOSKELETAL:  No edema; No deformity  SKIN: Warm and dry NEUROLOGIC:  Alert and oriented x 3 PSYCHIATRIC:  Normal affect    EKGs/Labs/Other Studies Reviewed:    The following studies were reviewed today: Nuclear stress test 01/25/2014   Notes Recorded by Dorothy Spark, MD on 01/29/2014 at 6:11 AM Normal stress test, normal LVEF, please let her know.   Echocardiogram 07/16/2017 Study Conclusions   - Left ventricle: The cavity size was normal. There was mild    concentric hypertrophy. Systolic function was normal. The    estimated ejection fraction was in the range of 60% to 65%. Wall    motion was normal; there were no regional wall motion    abnormalities. Doppler parameters are consistent with  abnormal    left ventricular relaxation (grade 1 diastolic dysfunction).    Doppler parameters are consistent with high ventricular filling    pressure.  - Aortic valve: Transvalvular velocity was within the normal range.    There was no stenosis. There was no regurgitation.  - Mitral valve: Transvalvular velocity was within the normal range.    There was no evidence for stenosis. There was trivial    regurgitation.  -  Right ventricle: The cavity size was normal. Wall thickness was    normal. Systolic function was normal.  - Atrial septum: No defect or patent foramen ovale was identified    by color flow Doppler.  - Tricuspid valve: There was trivial regurgitation.  - Pulmonary arteries: Systolic pressure was within the normal    range. PA peak pressure: 28 mm Hg (S).  Echocardiogram 01/17/2021 IMPRESSIONS     1. Left ventricular ejection fraction, by estimation, is 65 to 70%. The  left ventricle has normal function. The left ventricle has no regional  wall motion abnormalities. There is mild left ventricular hypertrophy.  Left ventricular diastolic parameters  are consistent with Grade I diastolic dysfunction (impaired relaxation).   2. Right ventricular systolic function is normal. The right ventricular  size is normal. There is normal pulmonary artery systolic pressure. The  estimated right ventricular systolic pressure is 40.9 mmHg.   3. The mitral valve is abnormal. Trivial mitral valve regurgitation.   4. The aortic valve is tricuspid. Aortic valve regurgitation is not  visualized.   5. The inferior vena cava is normal in size with greater than 50%  respiratory variability, suggesting right atrial pressure of 3 mmHg.   Comparison(s): 07/16/2017 prior EF 60%.  Cardiac event monitor 01/17/2021 Predominant rhythm:   SInus rhythm   Rate 48 to 128 bpm  Average HR 74 bpm    Short burst of SVT longest 18 beats at 117 bpm; fastest 4 beats at 184 bpm    Rare PVCs, PACs    No triggered events    EKG: None today.  Recent Labs: 01/04/2021: Hemoglobin 14.2; Platelets 167 01/30/2021: ALT 11; BUN 23; Creat 0.88; Potassium 4.4; Sodium 139; TSH 1.85  Recent Lipid Panel    Component Value Date/Time   CHOL 133 01/30/2021 0827   CHOL 139 09/26/2015 0837   TRIG 97 01/30/2021 0827   HDL 67 01/30/2021 0827   HDL 57 09/26/2015 0837   CHOLHDL 2.0 01/30/2021 0827   VLDL 29 07/03/2016 0825   LDLCALC 48  01/30/2021 0827    ASSESSMENT & PLAN    Palpitations-continue to be occasional.  Dizziness resolved.  Cardiac event monitor showed mainly sinus rhythm with short burst of SVT and PVCs/PACs.  Previous EKG today shows normal sinus rhythm anterior infarct undetermined age 72 bpm.   Continue metoprolol Heart healthy low-sodium diet-salty 6 given Increase physical activity as tolerated Maintain p.o. hydration   Weakness/DOE/fatigue-stable.  Echocardiogram showed EF 60-65% and G1 DD.  Continues to deny  bleeding issues.  Continues to lose weight gradually.  She has been following a reduced calorie diet. No plans for further evaluation at this time.   Dizziness-reports dizziness over the last 6 months.  Has noted some improvement with Epley maneuvers.  Mainly but laying back.  Unclear whether this is related to cardiac issues or neurological in origin. Maintain p.o. hydration   Hyperlipidemia-LDL 48 on 01/30/2021 Continue rosuvastatin, omega-3 fatty acids Heart healthy low-sodium high-fiber diet Increase physical activity as tolerated   Essential hypertension-BP  today 110/64 .  Well-controlled on. Continue metoprolol, lisinopril Heart healthy low-sodium diet-salty 6 given Increase physical activity as tolerated     Precordial chest pain-stable/chronic.  Continues to have brief occasional episodes of chest discomfort both at rest and with physical activity.  Low risk nuclear stress test 2015. Continue to monitor.  No plans for ischemic evaluation at this time.   Lower extremity claudication-stable.  Continues  with occasional episodes of lower extremity cramping.  ABIs 09/16/2019 were normal. Increase physical activity as tolerated Maintain hydration Continue heat therapy   Disposition: Follow-up with Dr. Johney Frame or me 4 to 6 months.       Medication Adjustments/Labs and Tests Ordered: Current medicines are reviewed at length with the patient today.  Concerns regarding medicines  are outlined above.  No orders of the defined types were placed in this encounter.  No orders of the defined types were placed in this encounter.      Signed, Sierra Pelton, NP  02/15/2021 10:09 AM      Notice: This dictation was prepared with Dragon dictation along with smaller phrase technology. Any transcriptional errors that result from this process are unintentional and may not be corrected upon review.  I spent 13 minutes examining this patient, reviewing medications, and using patient centered shared decision making involving her cardiac care.  Prior to her visit I spent greater than 20 minutes reviewing her past medical history,  medications, and prior cardiac tests.

## 2021-02-15 ENCOUNTER — Encounter (HOSPITAL_BASED_OUTPATIENT_CLINIC_OR_DEPARTMENT_OTHER): Payer: Self-pay | Admitting: General Practice

## 2021-02-15 ENCOUNTER — Other Ambulatory Visit: Payer: Self-pay

## 2021-02-15 ENCOUNTER — Ambulatory Visit (HOSPITAL_BASED_OUTPATIENT_CLINIC_OR_DEPARTMENT_OTHER): Payer: Medicare HMO | Admitting: General Practice

## 2021-02-15 VITALS — BP 110/64 | HR 76 | Ht 66.0 in | Wt 145.8 lb

## 2021-02-15 DIAGNOSIS — R42 Dizziness and giddiness: Secondary | ICD-10-CM | POA: Diagnosis not present

## 2021-02-15 DIAGNOSIS — I739 Peripheral vascular disease, unspecified: Secondary | ICD-10-CM | POA: Diagnosis not present

## 2021-02-15 DIAGNOSIS — R002 Palpitations: Secondary | ICD-10-CM | POA: Diagnosis not present

## 2021-02-15 DIAGNOSIS — E782 Mixed hyperlipidemia: Secondary | ICD-10-CM | POA: Diagnosis not present

## 2021-02-15 DIAGNOSIS — R5383 Other fatigue: Secondary | ICD-10-CM | POA: Diagnosis not present

## 2021-02-15 DIAGNOSIS — R072 Precordial pain: Secondary | ICD-10-CM

## 2021-02-15 DIAGNOSIS — I10 Essential (primary) hypertension: Secondary | ICD-10-CM

## 2021-02-15 NOTE — Patient Instructions (Signed)
Medication Instructions:  Continue current medications  *If you need a refill on your cardiac medications before your next appointment, please call your pharmacy*   Lab Work: None Ordered   Testing/Procedures: None Ordered   Follow-Up: At [facility], you and your health needs are our priority.  As part of our continuing mission to provide you with exceptional heart care, we have created designated Provider Care Teams.  These Care Teams include your primary Cardiologist (physician) and Advanced Practice Providers (APPs -  Physician Assistants and Nurse Practitioners) who all work together to provide you with the care you need, when you need it.  We recommend signing up for the patient portal called "MyChart".  Sign up information is provided on this After Visit Summary.  MyChart is used to connect with patients for Virtual Visits (Telemedicine).  Patients are able to view lab/test results, encounter notes, upcoming appointments, etc.  Non-urgent messages can be sent to your provider as well.   To learn more about what you can do with MyChart, go to [email or web address].    Your next appointment:   6 month(s)  The format for your next appointment:   In Person  Provider:   You may see Freada Bergeron, MD or one of the following Advanced Practice Providers on your designated Care Team:   Richardson Dopp, PA-C [place], [place]

## 2021-02-21 ENCOUNTER — Telehealth: Payer: Self-pay | Admitting: *Deleted

## 2021-02-21 ENCOUNTER — Telehealth: Payer: Self-pay | Admitting: Podiatrist

## 2021-02-21 NOTE — Telephone Encounter (Signed)
Pt called and left message checking on diabetic shoes.  I returned call and we have not gotten the medicare documents from the pcp office and it was faxed multiple times and I did refax it again today. She is calling the pcp office.

## 2021-02-21 NOTE — Telephone Encounter (Signed)
Received fax from [place] [facility] [place] for [place].  To be signed and faxed back to: [phone]  Placed in Dr. Ammie Ferrier folder to review and sign because it has to be signed by a MD or DO.

## 2021-03-07 DIAGNOSIS — D122 Benign neoplasm of ascending colon: Secondary | ICD-10-CM | POA: Diagnosis not present

## 2021-03-07 DIAGNOSIS — D123 Benign neoplasm of transverse colon: Secondary | ICD-10-CM | POA: Diagnosis not present

## 2021-03-07 DIAGNOSIS — K635 Polyp of colon: Secondary | ICD-10-CM | POA: Diagnosis not present

## 2021-03-07 DIAGNOSIS — K5730 Diverticulosis of large intestine without perforation or abscess without bleeding: Secondary | ICD-10-CM | POA: Diagnosis not present

## 2021-03-07 DIAGNOSIS — Z86010 Personal history of colonic polyps: Secondary | ICD-10-CM | POA: Diagnosis not present

## 2021-03-20 ENCOUNTER — Telehealth: Payer: Self-pay | Admitting: Podiatrist

## 2021-03-20 NOTE — Telephone Encounter (Signed)
Pt called earlier today asking about status of diabetic shoes.  Upon checking we did get documents but they did not approve it so I talked with Joneen Caraway @ [employer] and they approved them now.  I  left message for pt that we do have the documents and I will get the inserts rushed.

## 2021-04-04 ENCOUNTER — Other Ambulatory Visit: Payer: Self-pay

## 2021-04-04 ENCOUNTER — Ambulatory Visit: Payer: Medicare HMO

## 2021-04-04 DIAGNOSIS — E1142 Type 2 diabetes mellitus with diabetic polyneuropathy: Secondary | ICD-10-CM

## 2021-04-04 NOTE — Progress Notes (Signed)
Attempted to fit diabetic shoes and insoles to patient. Shoes ordered were too tight across her toes. Showed patient the A3200W lycra stretch shoe and patient would like to get those instead. Patient understands it will take between one to two weeks for shoes to arrive. Resized patient for A3200W 10W. All questions answered and concerns addressed. Inserts are good and will be fit to new shoes.

## 2021-04-18 ENCOUNTER — Other Ambulatory Visit: Payer: Self-pay

## 2021-04-18 ENCOUNTER — Ambulatory Visit: Payer: Medicare HMO

## 2021-04-18 DIAGNOSIS — E1142 Type 2 diabetes mellitus with diabetic polyneuropathy: Secondary | ICD-10-CM

## 2021-04-18 DIAGNOSIS — M205X1 Other deformities of toe(s) (acquired), right foot: Secondary | ICD-10-CM

## 2021-04-18 DIAGNOSIS — M216X2 Other acquired deformities of left foot: Secondary | ICD-10-CM | POA: Diagnosis not present

## 2021-04-18 DIAGNOSIS — M216X1 Other acquired deformities of right foot: Secondary | ICD-10-CM | POA: Diagnosis not present

## 2021-04-18 NOTE — Progress Notes (Signed)
SITUATION Reason for Visit: Fitting of Diabetic Shoes & Insoles Patient / Caregiver Report:  Patient reports comfort  OBJECTIVE DATA: Patient History / Diagnosis:     ICD-10-CM   1. Type 2 diabetes mellitus with diabetic polyneuropathy, without long-term current use of insulin (HCC)  E11.42     2. Hallux limitus of right foot  M20.5X1       Change in Status:   None  ACTIONS PERFORMED: In-Person Delivery, patient was fit with: - 1x pair A5500 PDAC approved prefabricated Diabetic Shoes: A3200W accomodative shoes - 3x pair [id number] PDAC approved CAM milled custom diabetic insoles  Shoes and insoles were verified for structural integrity and safety. Patient wore shoes and insoles in office. Skin was inspected and free of areas of concern after wearing shoes and inserts. Shoes and inserts fit properly. Patient / Caregiver provided with ferbal instruction and demonstration regarding donning, doffing, wear, care, proper fit, function, purpose, cleaning, and use of shoes and insoles ' and in all related precautions and risks and benefits regarding shoes and insoles. Patient / Caregiver was instructed to wear properly fitting socks with shoes at all times. Patient was also provided with verbal instruction regarding how to report any failures or malfunctions of shoes or inserts, and necessary follow up care. Patient / Caregiver was also instructed to contact physician regarding change in status that may affect function of shoes and inserts.   Patient / Caregiver verbalized undersatnding of instruction provided. Patient / Caregiver demonstrated independence with proper donning and doffing of shoes and inserts.  PLAN Patient to follow up as needed. Plan of care was discussed with and agreed upon by patient and/or caregiver. All questions were answered and concerns addressed.

## 2021-04-21 ENCOUNTER — Ambulatory Visit: Payer: Medicare HMO | Admitting: Cardiology

## 2021-05-04 ENCOUNTER — Telehealth: Payer: Self-pay

## 2021-05-04 DIAGNOSIS — E1142 Type 2 diabetes mellitus with diabetic polyneuropathy: Secondary | ICD-10-CM

## 2021-05-04 MED ORDER — ACCU-CHEK AVIVA PLUS W/DEVICE KIT: PACK | 0 refills | Status: AC

## 2021-05-04 NOTE — Telephone Encounter (Signed)
Patient called back and [facility] requesting referral for ophthalmologist.  To [name], NP

## 2021-05-04 NOTE — Telephone Encounter (Signed)
Patient called stating her diabetic machine is no longer working and need a prescription for another one sent to [facility] for a Soft Clix Accu Check machine.  Unable to locate soft clix, reordered previous.

## 2021-05-04 NOTE — Telephone Encounter (Signed)
Referral ordered

## 2021-05-05 ENCOUNTER — Ambulatory Visit (INDEPENDENT_AMBULATORY_CARE_PROVIDER_SITE_OTHER): Payer: Medicare HMO | Admitting: Family

## 2021-05-05 ENCOUNTER — Encounter: Payer: Self-pay | Admitting: Family

## 2021-05-05 ENCOUNTER — Other Ambulatory Visit: Payer: Self-pay

## 2021-05-05 VITALS — BP 130/80 | HR 83 | Temp 97.2°F | Resp 16 | Ht 66.0 in | Wt 146.8 lb

## 2021-05-05 DIAGNOSIS — R350 Frequency of micturition: Secondary | ICD-10-CM

## 2021-05-05 DIAGNOSIS — N898 Other specified noninflammatory disorders of vagina: Secondary | ICD-10-CM | POA: Diagnosis not present

## 2021-05-05 DIAGNOSIS — E1142 Type 2 diabetes mellitus with diabetic polyneuropathy: Secondary | ICD-10-CM | POA: Diagnosis not present

## 2021-05-05 DIAGNOSIS — R300 Dysuria: Secondary | ICD-10-CM

## 2021-05-05 DIAGNOSIS — R8290 Unspecified abnormal findings in urine: Secondary | ICD-10-CM | POA: Diagnosis not present

## 2021-05-05 LAB — POCT URINALYSIS DIPSTICK
Bilirubin, UA: NEGATIVE
Glucose, UA: NEGATIVE
Ketones, UA: NEGATIVE
Nitrite, UA: NEGATIVE
Protein, UA: NEGATIVE
Spec Grav, UA: <=1.005 — AB (ref 1.010–1.025)
Urobilinogen, UA: NEGATIVE E.U./dL — AB
pH, UA: 6 (ref 5.0–8.0)

## 2021-05-05 MED ORDER — CRANBERRY 475 MG PO CAPS: 475.0000 mg | ORAL_CAPSULE | Freq: Two times a day (BID) | ORAL | 0 refills | Status: AC

## 2021-05-05 MED ORDER — ACCU-CHEK SOFTCLIX LANCETS MISC: 1 refills | Status: DC

## 2021-05-05 NOTE — Progress Notes (Signed)
Provider: [name]  Saleha Kalp, [name], NP  Patient Care Team: Aurelie Dicenzo, [name], NP as PCP - General (Family Medicine) Freada Bergeron, MD as PCP - Cardiology (Cardiology) Dorothy Spark, MD as Consulting Physician (Cardiology) Clent Jacks, MD as Consulting Physician (Ophthalmology)  Extended Emergency Contact Information Primary Emergency Contact: [name],Mark Address: [address], [place] Phone: [phone] Relation: Son  Code Status:  Full Code  Goals of care: Advanced Directive information Advanced Directives 05/05/2021  Does Patient Have a Medical Advance Directive? Yes  Type of Advance Directive Living will  Does patient want to make changes to medical advance directive? No - Patient declined  Copy of [place] in Chart? -     Chief Complaint  Patient presents with   Acute Visit    Patient complains of lower abdominal pain for two days.    HPI:  Pt is a 76 y.o. female seen today for an acute visit for evaluation of lower abdominal pain,urine frequency and burning sensation x 2. denies any fever,chills,nausea,vomiting,abdominal pain,flank pain,dysuria,difficult urination or hematuria. Request refills for lancet for her glucometer.   Past Medical History:  Diagnosis Date   Abnormal Pap smear 05/08/2011   ASC-US +HPV   Arthritis    Asthma    Constipation    Diabetes mellitus type 2 with neurological manifestations (HCC)    Diabetic peripheral neuropathy (HCC)    Generalized osteoarthritis    GERD (gastroesophageal reflux disease)    Hx of seasonal allergies    Hyperlipidemia    Hypertension    IBS (irritable bowel syndrome)    S/P total hysterectomy 06/21/2011   Past Surgical History:  Procedure Laterality Date   CATARACT EXTRACTION Left    EYE SURGERY Right 01/2018   Removed raised area on eye. Dr.Groat    OVARIAN CYST SURGERY  1986   PELVIC LAPAROSCOPY      TONSILECTOMY, ADENOIDECTOMY, BILATERAL MYRINGOTOMY AND TUBES  1974   VAGINAL HYSTERECTOMY  1983    Allergies  Allergen Reactions   Aspirin    Pravastatin Sodium Other (See Comments)    Muscle aches   Restasis [Cyclosporine] Other (See Comments)    Outpatient Encounter Medications as of 05/05/2021  Medication Sig   ACCU-CHEK AVIVA PLUS test strip USE TO TEST BLOOD SUGAR TWICE DAILY   Accu-Chek Softclix Lancets lancets Use to test blood sugar one time daily for diabetes. Dx:E11.42   amLODipine (NORVASC) 5 MG tablet Take 5 mg by mouth daily. 1/2 tablet (2.65m)   b complex vitamins capsule Take 1 capsule by mouth daily.   Blood Glucose Monitoring Suppl (ACCU-CHEK AVIVA PLUS) w/Device KIT Use to test blood sugar twice daily. Dx E11.42   cholecalciferol (VITAMIN D) 1000 UNITS tablet Take 1,000 Units by mouth daily.   fluticasone (FLONASE) 50 MCG/ACT nasal spray Place 2 sprays into both nostrils as needed.    Lancets Misc. (ACCU-CHEK SOFTCLIX LANCET DEV) KIT Use to test blood sugar. Dx: E11.42   latanoprost (XALATAN) 0.005 % ophthalmic solution 1 drop at bedtime.   lisinopril (ZESTRIL) 2.5 MG tablet Take 1 tablet (2.5 mg total) by mouth daily.   loratadine (CLARITIN) 10 MG tablet Take 10 mg by mouth daily as needed.   metFORMIN (GLUCOPHAGE) 500 MG tablet TAKE 2 TABLETS EVERY DAY WITH BREAKFAST   metoprolol tartrate (LOPRESSOR) 25 MG tablet Take 0.5 tablets (12.5 mg total) by mouth 2 (two)  times daily as needed (palpitations).   Multiple Vitamins-Minerals (MULTIVITAMIN WITH MINERALS) tablet Take 1 tablet by mouth daily.   multivitamin-lutein (OCUVITE-LUTEIN) CAPS capsule Take 1 capsule by mouth daily.   Omega-3 Fatty Acids (FISH OIL) 1000 MG CAPS Take 2 capsules by mouth 2 (two) times a week.    polyethylene glycol (MIRALAX / GLYCOLAX) packet Take 17 g by mouth daily as needed. For constipation   Probiotic Product (ALIGN) 4 MG CAPS Take 1 capsule by mouth daily.    Propylene Glycol (SYSTANE  COMPLETE OP) Place 1 drop into both eyes in the morning, at noon, in the evening, and at bedtime.   rosuvastatin (CRESTOR) 5 MG tablet TAKE 1 TABLET EVERY DAY   traZODone (DESYREL) 50 MG tablet Take 1/2 to 1 tablet by mouth once daily as needed   No facility-administered encounter medications on file as of 05/05/2021.    Review of Systems  Constitutional:  Negative for appetite change, chills and fatigue.  Respiratory:  Negative for cough, chest tightness, shortness of breath and wheezing.   Cardiovascular:  Negative for chest pain, palpitations and leg swelling.  Gastrointestinal:  Positive for abdominal pain. Negative for abdominal distention, constipation, diarrhea, nausea and vomiting.  Musculoskeletal:  Positive for arthralgias and back pain.  Skin:  Negative for color change, pallor and rash.   Immunization History  Administered Date(s) Administered   Fluad Quad(high Dose 65+) 02/11/2019, 02/12/2020, 01/27/2021   Influenza, High Dose Seasonal PF 01/28/2018   Influenza,inj,Quad PF,6+ Mos 02/03/2016, 01/23/2017   Moderna Sars-Covid-2 Vaccination 06/22/2019, 07/24/2019   Pneumococcal Conjugate-13 07/30/2014   Pneumococcal Polysaccharide-23 11/17/2005, 05/20/2012   Td 08/18/2010   Pertinent  Health Maintenance Due  Topic Date Due   FOOT EXAM  01/23/2018   HEMOGLOBIN A1C  07/31/2021   MAMMOGRAM  11/05/2021   OPHTHALMOLOGY EXAM  01/11/2022   COLONOSCOPY (Pts 45-61yr Insurance coverage will need to be confirmed)  09/05/2025   INFLUENZA VACCINE  Completed   DEXA SCAN  Completed   Fall Risk 11/07/2020 12/06/2020 12/27/2020 02/03/2021 05/05/2021  Falls in the past year? - 0 0 0 0  Was there an injury with Fall? - 0 0 0 0  Fall Risk Category Calculator - 0 0 0 0  Fall Risk Category - Low Low Low Low  Patient Fall Risk Level Low fall risk Low fall risk Low fall risk - Low fall risk  Patient at Risk for Falls Due to - No Fall Risks No Fall Risks No Fall Risks No Fall Risks  Fall risk  Follow up - Falls evaluation completed Falls evaluation completed Falls evaluation completed Falls evaluation completed   Functional Status Survey:    Vitals:   05/05/21 1033  BP: 130/80  Pulse: 83  Resp: 16  Temp: (!) 97.2 F (36.2 C)  SpO2: 98%  Weight: 146 lb 12.8 oz (66.6 kg)  Height: '5\' 6"'  (1.676 m)   Body mass index is 23.69 kg/m. Physical Exam Vitals reviewed.  Constitutional:      General: She is not in acute distress.    Appearance: Normal appearance. She is normal weight. She is not ill-appearing or diaphoretic.  HENT:     Head: Normocephalic.  Eyes:     General: No scleral icterus.       Right eye: No discharge.        Left eye: No discharge.     Extraocular Movements: Extraocular movements intact.     Conjunctiva/sclera: Conjunctivae normal.     Pupils: Pupils are  equal, round, and reactive to light.  Cardiovascular:     Rate and Rhythm: Normal rate and regular rhythm.     Pulses: Normal pulses.     Heart sounds: Normal heart sounds. No murmur heard.   No friction rub. No gallop.  Pulmonary:     Effort: Pulmonary effort is normal. No respiratory distress.     Breath sounds: Normal breath sounds. No wheezing, rhonchi or rales.  Chest:     Chest wall: No tenderness.  Abdominal:     General: Bowel sounds are normal. There is no distension.     Palpations: Abdomen is soft. There is no mass.     Tenderness: There is abdominal tenderness in the suprapubic area. There is no right CVA tenderness, left CVA tenderness, guarding or rebound.  Skin:    General: Skin is warm and dry.     Coloration: Skin is not pale.     Findings: No erythema or rash.  Neurological:     Mental Status: She is alert and oriented to person, place, and time.     Motor: No weakness.     Gait: Gait abnormal.  Psychiatric:        Mood and Affect: Mood normal.        Speech: Speech normal.        Behavior: Behavior normal.        Thought Content: Thought content normal.         Judgment: Judgment normal.    Labs reviewed: Recent Labs    08/01/20 0807 01/04/21 1043 01/30/21 0827  NA 142 142 139  K 4.2 4.4 4.4  CL 104 101 103  CO2 '29 28 26  ' GLUCOSE 126* 105* 101*  BUN '20 20 23  ' CREATININE 0.92 0.90 0.88  CALCIUM 9.4 9.8 9.4   Recent Labs    08/01/20 0807 01/30/21 0827  AST 19 16  ALT 15 11  BILITOT 0.5 0.4  PROT 7.3 6.9   Recent Labs    08/01/20 0807 01/04/21 1043  WBC 5.0 5.6  NEUTROABS 1,900  --   HGB 14.5 14.2  HCT 43.8 42.6  MCV 86.2 86  PLT 173 167   Lab Results  Component Value Date   TSH 1.85 01/30/2021   Lab Results  Component Value Date   HGBA1C 6.7 (H) 01/30/2021   Lab Results  Component Value Date   CHOL 133 01/30/2021   HDL 67 01/30/2021   LDLCALC 48 01/30/2021   TRIG 97 01/30/2021   CHOLHDL 2.0 01/30/2021    Significant Diagnostic Results in last 30 days:  No results found.  Assessment/Plan 1. Urinary frequency Afebrile  - POC Urinalysis Dipstick indicates yellow clear urine positive for trace blood and Leukocytes 1+ but negative for nitrites.  - Urine Culture  2. Abnormal urine odor Encouraged to increase water intake  - POC Urinalysis Dipstick - Urine Culture  3. Vaginal itching Possible UTI no vaginal discharge reported. - POC Urinalysis Dipstick - Urine Culture  4. Dysuria Afebrile  Suprapubic tenderness on exam  - POC Urinalysis Dipstick indicates yellow clear urine positive for trace blood and Leukocytes 1+ but negative for nitrites.  - Urine Culture  Family/ staff Communication: Reviewed plan of care with patient verbalized understanding.  Labs/tests ordered: - POC Urinalysis Dipstick - Urine Culture  Next Appointment: As needed if symptoms worsen or fail to improve    [name], NP

## 2021-05-05 NOTE — Patient Instructions (Signed)
Urinary Tract Infection, Adult A urinary tract infection (UTI) is an infection of any part of the urinary tract. The urinary tract includes the kidneys, ureters, bladder, and urethra. These organs make, store, and get rid of urine in the body. An upper UTI affects the ureters and kidneys. A lower UTI affects the bladder and urethra. What are the causes? Most urinary tract infections are caused by bacteria in your genital area around your urethra, where urine leaves your body. These bacteria grow and cause inflammation of your urinary tract. What increases the risk? You are more likely to develop this condition if: You have a urinary catheter that stays in place. You are not able to control when you urinate or have a bowel movement (incontinence). You are female and you: Use a spermicide or diaphragm for birth control. Have low estrogen levels. Are pregnant. You have certain genes that increase your risk. You are sexually active. You take antibiotic medicines. You have a condition that causes your flow of urine to slow down, such as: An enlarged prostate, if you are female. Blockage in your urethra. A kidney stone. A nerve condition that affects your bladder control (neurogenic bladder). Not getting enough to drink, or not urinating often. You have certain medical conditions, such as: Diabetes. A weak disease-fighting system (immunesystem). Sickle cell disease. Gout. Spinal cord injury. What are the signs or symptoms? Symptoms of this condition include: Needing to urinate right away (urgency). Frequent urination. This may include small amounts of urine each time you urinate. Pain or burning with urination. Blood in the urine. Urine that smells bad or unusual. Trouble urinating. Cloudy urine. Vaginal discharge, if you are female. Pain in the abdomen or the lower back. You may also have: Vomiting or a decreased appetite. Confusion. Irritability or tiredness. A fever or  chills. Diarrhea. The first symptom in older adults may be confusion. In some cases, they may not have any symptoms until the infection has worsened. How is this diagnosed? This condition is diagnosed based on your medical history and a physical exam. You may also have other tests, including: Urine tests. Blood tests. Tests for STIs (sexually transmitted infections). If you have had more than one UTI, a cystoscopy or imaging studies may be done to determine the cause of the infections. How is this treated? Treatment for this condition includes: Antibiotic medicine. Over-the-counter medicines to treat discomfort. Drinking enough water to stay hydrated. If you have frequent infections or have other conditions such as a kidney stone, you may need to see a health care provider who specializes in the urinary tract (urologist). In rare cases, urinary tract infections can cause sepsis. Sepsis is a life-threatening condition that occurs when the body responds to an infection. Sepsis is treated in the hospital with IV antibiotics, fluids, and other medicines. Follow these instructions at home: Medicines Take over-the-counter and prescription medicines only as told by your health care provider. If you were prescribed an antibiotic medicine, take it as told by your health care provider. Do not stop using the antibiotic even if you start to feel better. General instructions Make sure you: Empty your bladder often and completely. Do not hold urine for long periods of time. Empty your bladder after sex. Wipe from front to back after urinating or having a bowel movement if you are female. Use each tissue only one time when you wipe. Drink enough fluid to keep your urine pale yellow. Keep all follow-up visits. This is important. Contact a health care provider   if: Your symptoms do not get better after 1-2 days. Your symptoms go away and then return. Get help right away if: You have severe pain in your  back or your lower abdomen. You have a fever or chills. You have nausea or vomiting. Summary A urinary tract infection (UTI) is an infection of any part of the urinary tract, which includes the kidneys, ureters, bladder, and urethra. Most urinary tract infections are caused by bacteria in your genital area. Treatment for this condition often includes antibiotic medicines. If you were prescribed an antibiotic medicine, take it as told by your health care provider. Do not stop using the antibiotic even if you start to feel better. Keep all follow-up visits. This is important. This information is not intended to replace advice given to you by your health care provider. Make sure you discuss any questions you have with your health care provider. Document Revised: 11/13/2019 Document Reviewed: 11/13/2019 Elsevier Patient Education  2022 [employer]  

## 2021-05-06 LAB — URINE CULTURE
MICRO NUMBER:: 12899077
Result:: NO GROWTH
SPECIMEN QUALITY:: ADEQUATE

## 2021-05-08 ENCOUNTER — Other Ambulatory Visit: Payer: Self-pay | Admitting: *Deleted

## 2021-05-08 DIAGNOSIS — E1142 Type 2 diabetes mellitus with diabetic polyneuropathy: Secondary | ICD-10-CM

## 2021-05-08 MED ORDER — ACCU-CHEK AVIVA PLUS VI STRP: ORAL_STRIP | 3 refills | Status: DC

## 2021-05-08 MED ORDER — BD SWAB SINGLE USE REGULAR PADS: MEDICATED_PAD | 3 refills | Status: DC

## 2021-05-08 NOTE — Telephone Encounter (Signed)
Pharmacy requested refills

## 2021-06-19 ENCOUNTER — Other Ambulatory Visit: Payer: Self-pay

## 2021-06-19 ENCOUNTER — Ambulatory Visit (INDEPENDENT_AMBULATORY_CARE_PROVIDER_SITE_OTHER): Payer: Medicare HMO | Admitting: Family

## 2021-06-19 ENCOUNTER — Encounter: Payer: Self-pay | Admitting: Family

## 2021-06-19 VITALS — BP 138/82 | HR 93 | Temp 97.3°F | Resp 16 | Ht 66.0 in | Wt 149.8 lb

## 2021-06-19 DIAGNOSIS — M79601 Pain in right arm: Secondary | ICD-10-CM | POA: Diagnosis not present

## 2021-06-19 DIAGNOSIS — M79641 Pain in right hand: Secondary | ICD-10-CM | POA: Diagnosis not present

## 2021-06-19 DIAGNOSIS — M25551 Pain in right hip: Secondary | ICD-10-CM

## 2021-06-19 MED ORDER — ACETAMINOPHEN 500 MG PO TABS: 500.0000 mg | ORAL_TABLET | Freq: Three times a day (TID) | ORAL | 0 refills | Status: AC | PRN

## 2021-06-19 NOTE — Patient Instructions (Signed)
[facility]  ?
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
## 2021-06-20 ENCOUNTER — Ambulatory Visit (INDEPENDENT_AMBULATORY_CARE_PROVIDER_SITE_OTHER): Payer: Medicare HMO | Admitting: Ophthalmology

## 2021-06-20 ENCOUNTER — Encounter (INDEPENDENT_AMBULATORY_CARE_PROVIDER_SITE_OTHER): Payer: Self-pay | Admitting: Ophthalmology

## 2021-06-20 DIAGNOSIS — H43822 Vitreomacular adhesion, left eye: Secondary | ICD-10-CM | POA: Diagnosis not present

## 2021-06-20 DIAGNOSIS — E1142 Type 2 diabetes mellitus with diabetic polyneuropathy: Secondary | ICD-10-CM

## 2021-06-20 DIAGNOSIS — H2511 Age-related nuclear cataract, right eye: Secondary | ICD-10-CM | POA: Diagnosis not present

## 2021-06-20 DIAGNOSIS — H43811 Vitreous degeneration, right eye: Secondary | ICD-10-CM | POA: Diagnosis not present

## 2021-06-20 DIAGNOSIS — R6889 Other general symptoms and signs: Secondary | ICD-10-CM | POA: Diagnosis not present

## 2021-06-20 NOTE — Assessment & Plan Note (Signed)
No detectable DR 

## 2021-06-20 NOTE — Assessment & Plan Note (Signed)
Appears to have spontaneously released ?

## 2021-06-20 NOTE — Assessment & Plan Note (Signed)
Progressive cataract OD ?

## 2021-06-20 NOTE — Progress Notes (Signed)
06/20/2021     CHIEF COMPLAINT Patient presents for  Chief Complaint  Patient presents with   Retina Follow Up      HISTORY OF PRESENT ILLNESS: Sierra Schwartz V. is a 76 y.o. female who presents to the clinic today for:   HPI     Retina Follow Up           Diagnosis: Other   Laterality: left eye         Comments   1 yr fu, dilate OU, OCT Pt states" Dr. Zadie Ray referred me for cataract surgery on OD but I have a couple of questions before going forward with cataract surgery. I did not want to have cataract surgery because I still have a couple of questions for my left eye. Dr. Midge Ray performed laser on me due to cloudy lens but I did not want to continue with the procedure. I have not had cataract surgery performed on me because I want to talk to Dr. Zadie Ray first."  Pt states flashes of light (described it as top of the pin needle and not its a tack) and a few floaters that have diminished over time and has been reduced to 1 floater on OS. Pt is stating to have blurry vision.        Last edited by [name] on 06/20/2021 11:32 AM.      Referring physician: Warden Fillers, MD [address] [place],  [place] [id number]  HISTORICAL INFORMATION:   Selected notes from the MEDICAL RECORD NUMBER    Lab Results  Component Value Date   HGBA1C 6.7 (H) 01/30/2021     CURRENT MEDICATIONS: Current Outpatient Medications (Ophthalmic Drugs)  Medication Sig   latanoprost (XALATAN) 0.005 % ophthalmic solution 1 drop at bedtime.   Propylene Glycol (SYSTANE COMPLETE OP) Place 1 drop into both eyes in the morning, at noon, in the evening, and at bedtime.   No current facility-administered medications for this visit. (Ophthalmic Drugs)   Current Outpatient Medications (Other)  Medication Sig   Accu-Chek Softclix Lancets lancets Use to test blood sugar one time daily for diabetes. Dx:E11.42   acetaminophen (TYLENOL) 500 MG tablet Take 1 tablet (500 mg  total) by mouth every 8 (eight) hours as needed for moderate pain.   Alcohol Swabs (B-D SINGLE USE SWABS REGULAR) PADS Use to test blood sugar twice daily. Dx: E11.42   amLODipine (NORVASC) 5 MG tablet Take 5 mg by mouth daily. 1/2 tablet (2.42m)   b complex vitamins capsule Take 1 capsule by mouth daily.   Blood Glucose Monitoring Suppl (ACCU-CHEK AVIVA PLUS) w/Device KIT Use to test blood sugar twice daily. Dx E11.42   cholecalciferol (VITAMIN D) 1000 UNITS tablet Take 1,000 Units by mouth daily.   fluticasone (FLONASE) 50 MCG/ACT nasal spray Place 2 sprays into both nostrils as needed.    glucose blood (ACCU-CHEK AVIVA PLUS) test strip Use to test blood sugar twice daily. Dx:E11.42   Lancets Misc. (ACCU-CHEK SOFTCLIX LANCET DEV) KIT Use to test blood sugar. Dx: E11.42   lisinopril (ZESTRIL) 2.5 MG tablet Take 1 tablet (2.5 mg total) by mouth daily.   loratadine (CLARITIN) 10 MG tablet Take 10 mg by mouth daily as needed.   metFORMIN (GLUCOPHAGE) 500 MG tablet TAKE 2 TABLETS EVERY DAY WITH BREAKFAST   metoprolol tartrate (LOPRESSOR) 25 MG tablet Take 0.5 tablets (12.5 mg total) by mouth 2 (two) times daily as needed (palpitations).   Multiple Vitamins-Minerals (MULTIVITAMIN WITH  MINERALS) tablet Take 1 tablet by mouth daily.   multivitamin-lutein (OCUVITE-LUTEIN) CAPS capsule Take 1 capsule by mouth daily.   polyethylene glycol (MIRALAX / GLYCOLAX) packet Take 17 g by mouth daily as needed. For constipation   Probiotic Product (ALIGN) 4 MG CAPS Take 1 capsule by mouth daily.    rosuvastatin (CRESTOR) 5 MG tablet TAKE 1 TABLET EVERY DAY   traZODone (DESYREL) 50 MG tablet Take 1/2 to 1 tablet by mouth once daily as needed   No current facility-administered medications for this visit. (Other)      REVIEW OF SYSTEMS: ROS   Negative for: Constitutional, Gastrointestinal, Neurological, Skin, Genitourinary, Musculoskeletal, HENT, Endocrine, Cardiovascular, Eyes, Respiratory, Psychiatric,  Allergic/Imm, Heme/Lymph Last edited by Hurman Horn, MD on 06/20/2021 11:50 AM.       ALLERGIES Allergies  Allergen Reactions   Aspirin    Pravastatin Sodium Other (See Comments)    Muscle aches   Restasis [Cyclosporine] Other (See Comments)    PAST MEDICAL HISTORY Past Medical History:  Diagnosis Date   Abnormal Pap smear 05/08/2011   ASC-US +HPV   Arthritis    Asthma    Constipation    Diabetes mellitus type 2 with neurological manifestations ([place])    Diabetic peripheral neuropathy (HCC)    Generalized osteoarthritis    GERD (gastroesophageal reflux disease)    Hx of seasonal allergies    Hyperlipidemia    Hypertension    IBS (irritable bowel syndrome)    S/P total hysterectomy 06/21/2011   Past Surgical History:  Procedure Laterality Date   CATARACT EXTRACTION Left    EYE SURGERY Right 01/2018   Removed raised area on eye. Dr.Groat    OVARIAN CYST SURGERY  1986   PELVIC LAPAROSCOPY     TONSILECTOMY, ADENOIDECTOMY, BILATERAL MYRINGOTOMY AND TUBES  1974   VAGINAL HYSTERECTOMY  1983    FAMILY HISTORY Family History  Problem Relation Age of Onset   Diabetes Father    Heart disease Father    Diabetes Mother    Cancer Mother        colon    Pancreatic cancer Brother    Pancreatic cancer Sister    Cancer Paternal Grandmother        liver    Breast cancer Cousin    Breast cancer Maternal Aunt     SOCIAL HISTORY Social History   Tobacco Use   Smoking status: Never   Smokeless tobacco: Never  Vaping Use   Vaping Use: Never used  Substance Use Topics   Alcohol use: No   Drug use: No         OPHTHALMIC EXAM:  Base Eye Exam     Visual Acuity (ETDRS)       Right Left   Dist cc 20/30 -1 20/30 -2   Dist ph cc NI 20/25 -2    Correction: Glasses         Tonometry (Tonopen, 11:18 AM)       Right Left   Pressure 10 11         Pupils       Pupils Dark Light Shape React APD   Right PERRL 4 3 Round Slow None   Left PERRL 4 3 Round Slow  None         Visual Fields       Left Right    Full Full         Extraocular Movement       Right Left  Full Full         Neuro/Psych     Oriented x3: Yes   Mood/Affect: Normal         Dilation     Both eyes: 1.0% Mydriacyl, 2.5% Phenylephrine @ 11:16 AM           Slit Lamp and Fundus Exam     External Exam       Right Left   External Normal Normal         Slit Lamp Exam       Right Left   Lids/Lashes Normal Normal   Conjunctiva/Sclera White and quiet White and quiet   Cornea Clear Clear   Anterior Chamber Deep and quiet Deep and quiet   Iris Round and reactive Round and reactive   Lens 2+ Nuclear sclerosis Centered posterior chamber intraocular lens   Anterior Vitreous Normal Normal         Fundus Exam       Right Left   Posterior Vitreous Normal Central vitreous floaters,    Disc Normal Normal   C/D Ratio 0.55 0.55   Macula Normal Normal   Vessels Normal, no DR Normal, , no DR   Periphery , No holes or tears, watch region nasally for traction Normal, no holes and tears, 2.2D and 20 8D lenses            IMAGING AND PROCEDURES  Imaging and Procedures for 06/20/21  OCT, Retina - OU - Both Eyes       Right Eye Quality was good. Scan locations included subfoveal. Central Foveal Thickness: 207. Progression has been stable. Findings include abnormal foveal contour.   Left Eye Quality was good. Scan locations included subfoveal. Central Foveal Thickness: 228. Progression has been stable. Findings include abnormal foveal contour.   Notes OS, OS no residual VMA noted OD stable, no PVD noted OD             ASSESSMENT/PLAN:  Vitreomacular traction, left Appears to have spontaneously released  Nuclear sclerotic cataract of right eye Progressive cataract OD  Type 2 diabetes mellitus with diabetic polyneuropathy, without long-term current use of insulin (HCC) No detectable DR     ICD-10-CM   1. Posterior vitreous  detachment of right eye  H43.811 OCT, Retina - OU - Both Eyes    2. Vitreomacular traction, left  H43.822     3. Nuclear sclerotic cataract of right eye  H25.11     4. Type 2 diabetes mellitus with diabetic polyneuropathy, without long-term current use of insulin (HCC)  E11.42       1.  OU no obvious retinopathy  2.  OS looks great pseudophakic  3.  OD progressive NSC changes follow-up with Dr. Warden Fillers as scheduled, okay to proceed with surgery  Ophthalmic Meds Ordered this visit:  No orders of the defined types were placed in this encounter.      Return in about 1 year (around 06/21/2022) for DILATE OU, OCT, COLOR FP.  There are no Patient Instructions on file for this visit.   Explained the diagnoses, plan, and follow up with the patient and they expressed understanding.  Patient expressed understanding of the importance of proper follow up care.   Clent Demark Armonii Sierra Ray M.D. Diseases & Surgery of the Retina and Vitreous [facility] 06/20/21     Abbreviations: M myopia (nearsighted); A astigmatism; H hyperopia (farsighted); P presbyopia; Mrx spectacle prescription;  CTL contact lenses; OD right eye; OS left  eye; OU both eyes  XT exotropia; ET esotropia; PEK punctate epithelial keratitis; PEE punctate epithelial erosions; DES dry eye syndrome; MGD meibomian gland dysfunction; ATs artificial tears; PFAT's preservative free artificial tears; [place] nuclear sclerotic cataract; PSC posterior subcapsular cataract; ERM epi-retinal membrane; PVD posterior vitreous detachment; RD retinal detachment; DM diabetes mellitus; DR diabetic retinopathy; NPDR non-proliferative diabetic retinopathy; PDR proliferative diabetic retinopathy; CSME clinically significant macular edema; DME diabetic macular edema; dbh dot blot hemorrhages; CWS cotton wool spot; POAG primary open angle glaucoma; C/D cup-to-disc ratio; HVF humphrey visual field; GVF goldmann visual field; OCT optical  coherence tomography; IOP intraocular pressure; BRVO Branch retinal vein occlusion; CRVO central retinal vein occlusion; CRAO central retinal artery occlusion; BRAO branch retinal artery occlusion; RT retinal tear; SB scleral buckle; PPV pars plana vitrectomy; VH Vitreous hemorrhage; PRP panretinal laser photocoagulation; IVK intravitreal kenalog; VMT vitreomacular traction; MH Macular hole;  NVD neovascularization of the disc; NVE neovascularization elsewhere; AREDS age related eye disease study; ARMD age related macular degeneration; POAG primary open angle glaucoma; EBMD epithelial/anterior basement membrane dystrophy; ACIOL anterior chamber intraocular lens; IOL intraocular lens; PCIOL posterior chamber intraocular lens; Phaco/IOL phacoemulsification with intraocular lens placement; [place] photorefractive keratectomy; LASIK laser assisted in situ keratomileusis; HTN hypertension; DM diabetes mellitus; COPD chronic obstructive pulmonary disease

## 2021-06-21 ENCOUNTER — Ambulatory Visit
Admission: RE | Admit: 2021-06-21 | Discharge: 2021-06-21 | Disposition: A | Discharge status: Home / Self Care | Payer: Medicare HMO | Source: Ambulatory Visit | Attending: Family | Admitting: Family

## 2021-06-21 DIAGNOSIS — M79641 Pain in right hand: Secondary | ICD-10-CM | POA: Diagnosis not present

## 2021-06-21 DIAGNOSIS — M25551 Pain in right hip: Secondary | ICD-10-CM

## 2021-06-21 DIAGNOSIS — M79601 Pain in right arm: Secondary | ICD-10-CM

## 2021-07-10 ENCOUNTER — Other Ambulatory Visit: Payer: Self-pay | Admitting: Family

## 2021-07-10 DIAGNOSIS — Z1231 Encounter for screening mammogram for malignant neoplasm of breast: Secondary | ICD-10-CM

## 2021-07-25 ENCOUNTER — Ambulatory Visit
Admission: RE | Admit: 2021-07-25 | Discharge: 2021-07-25 | Disposition: A | Discharge status: Home / Self Care | Payer: Medicare HMO | Source: Ambulatory Visit | Attending: Family | Admitting: Family

## 2021-07-25 DIAGNOSIS — Z1231 Encounter for screening mammogram for malignant neoplasm of breast: Secondary | ICD-10-CM | POA: Diagnosis not present

## 2021-08-02 ENCOUNTER — Telehealth: Payer: Self-pay

## 2021-08-02 NOTE — Telephone Encounter (Signed)
Patient refused this recommendation as previously documented. Patient will see PCP [name], [name], NP tomorrow 08/03/2021.  ?

## 2021-08-02 NOTE — Telephone Encounter (Signed)
Patient called the office and states that she's been having severe abdominal pain for the past two days. Patient asked for appointment as soon as possible with PCP [name], [name], NP. Patient notified that PCP has no opening appointments today 08/02/2021 however PCP has availability tomorrow 08/03/2021. I advised patient to not wait for appointment and to visit Urgent Care or Hospital due to severity of abdominal pain. I expressed the importance to not wait until pain becomes unbearable. Patient refused and stated that she doesn't want to visit Urgent Care or Hospital because she doesn't like to see strange people she's not familiar with. Patient agreed to see PCP tomorrow 08/03/2021 at 10:20am. Message routed to PCP [name], [name], NP as Juluis Rainier. No further action is required.  ?

## 2021-08-02 NOTE — Telephone Encounter (Signed)
Recommend ED evaluation if pain is severe.  ?

## 2021-08-02 NOTE — Telephone Encounter (Signed)
Noted  

## 2021-08-03 ENCOUNTER — Other Ambulatory Visit: Payer: Self-pay | Admitting: Family

## 2021-08-03 ENCOUNTER — Ambulatory Visit (INDEPENDENT_AMBULATORY_CARE_PROVIDER_SITE_OTHER): Payer: Medicare HMO | Admitting: Family

## 2021-08-03 ENCOUNTER — Encounter: Payer: Self-pay | Admitting: Family

## 2021-08-03 ENCOUNTER — Telehealth: Payer: Self-pay | Admitting: *Deleted

## 2021-08-03 ENCOUNTER — Other Ambulatory Visit: Payer: Self-pay

## 2021-08-03 VITALS — BP 116/70 | HR 71 | Temp 97.7°F | Resp 16 | Ht 66.0 in | Wt 147.8 lb

## 2021-08-03 DIAGNOSIS — E1142 Type 2 diabetes mellitus with diabetic polyneuropathy: Secondary | ICD-10-CM

## 2021-08-03 DIAGNOSIS — R1084 Generalized abdominal pain: Secondary | ICD-10-CM | POA: Diagnosis not present

## 2021-08-03 DIAGNOSIS — E1169 Type 2 diabetes mellitus with other specified complication: Secondary | ICD-10-CM

## 2021-08-03 MED ORDER — METOPROLOL TARTRATE 25 MG PO TABS: 12.5000 mg | ORAL_TABLET | Freq: Two times a day (BID) | ORAL | 2 refills | Status: DC | PRN

## 2021-08-03 NOTE — Progress Notes (Signed)
? ?Provider: [name] ? ?Miliano Cotten, [name], NP ? ?Patient Care Team: ?Lindsee Labarre, [name], NP as PCP - General (Family Medicine) ?Freada Bergeron, MD as PCP - Cardiology (Cardiology) ?Dorothy Spark, MD as Consulting Physician (Cardiology) ?Clent Jacks, MD as Consulting Physician (Ophthalmology) ? ?Extended Emergency Contact Information ?Primary Emergency Contact: [name],Mark ?Address: [place], [place] ?Home Phone: [phone] ?Relation: Son ? ?Code Status:  Full Code  ?Goals of care: Advanced Directive information ? ?  08/03/2021  ? 10:18 AM  ?Advanced Directives  ?Does Patient Have a Medical Advance Directive? Yes  ?Type of Advance Directive Living will  ?Does patient want to make changes to medical advance directive? No - Patient declined  ? ? ? ?Chief Complaint  ?Patient presents with  ? Acute Visit  ?  Patient complains of severe abdominal pain for three days.   ? ? ?HPI:  ?Pt is a 76 y.o. female seen today for an acute visit for evaluation of severe abdominal pain x 2 days.Pain is described as generalized from lower abdomen to below the ribs.States if she had a gun she could have shot herself yesterday due to severity of the pain.  ?Bowels are small and hard " Like a rock".she did bowel cleansing one week ago. Feels full and distention and has no appetite but tries to eat due to being diabetic.  ?she denies any fever,chills,nausea,vomiting.Also denies any any blood in the stool.  ?Has had a weight loss of 3 lbs over one month.  ?Of note,she was advised to go to ED yesterday when she had severe abdominal pain but declined wanted to follow up at the office today.States still in pain today but no distress noted during visit.  ? ?Past Medical History:  ?Diagnosis Date  ? Abnormal Pap smear 05/08/2011  ? ASC-US +HPV  ? Arthritis   ? Asthma   ? Constipation   ? Diabetes mellitus type 2 with neurological manifestations ([place])   ? Diabetic peripheral  neuropathy ([place])   ? Generalized osteoarthritis   ? GERD (gastroesophageal reflux disease)   ? Hx of seasonal allergies   ? Hyperlipidemia   ? Hypertension   ? IBS (irritable bowel syndrome)   ? S/P total hysterectomy 06/21/2011  ? ?Past Surgical History:  ?Procedure Laterality Date  ? CATARACT EXTRACTION Left   ? EYE SURGERY Right 01/2018  ? Removed raised area on eye. Dr.Groat   ? OVARIAN CYST SURGERY  1986  ? PELVIC LAPAROSCOPY    ? TONSILECTOMY, ADENOIDECTOMY, BILATERAL MYRINGOTOMY AND TUBES  1974  ? VAGINAL HYSTERECTOMY  1983  ? ? ?Allergies  ?Allergen Reactions  ? Aspirin   ? Pravastatin Sodium Other (See Comments)  ?  Muscle aches  ? Restasis [Cyclosporine] Other (See Comments)  ? ? ?Outpatient Encounter Medications as of 08/03/2021  ?Medication Sig  ? Accu-Chek Softclix Lancets lancets Use to test blood sugar one time daily for diabetes. Dx:E11.42  ? acetaminophen (TYLENOL) 500 MG tablet Take 1 tablet (500 mg total) by mouth every 8 (eight) hours as needed for moderate pain.  ? Alcohol Swabs (B-D SINGLE USE SWABS REGULAR) PADS Use to test blood sugar twice daily. Dx: E11.42  ? b complex vitamins capsule Take 1 capsule by mouth daily.  ? Blood Glucose Monitoring Suppl (ACCU-CHEK AVIVA PLUS) w/Device KIT Use to test blood sugar twice daily. Dx E11.42  ? cholecalciferol (VITAMIN D) 1000 UNITS tablet  Take 1,000 Units by mouth daily.  ? fluticasone (FLONASE) 50 MCG/ACT nasal spray Place 2 sprays into both nostrils as needed.   ? glucose blood (ACCU-CHEK AVIVA PLUS) test strip Use to test blood sugar twice daily. Dx:E11.42  ? Lancets Misc. (ACCU-CHEK SOFTCLIX LANCET DEV) KIT Use to test blood sugar. Dx: E11.42  ? latanoprost (XALATAN) 0.005 % ophthalmic solution 1 drop at bedtime.  ? loratadine (CLARITIN) 10 MG tablet Take 10 mg by mouth daily as needed.  ? Multiple Vitamins-Minerals (MULTIVITAMIN WITH MINERALS) tablet Take 1 tablet by mouth daily.  ? multivitamin-lutein (OCUVITE-LUTEIN) CAPS capsule Take 1 capsule  by mouth daily.  ? polyethylene glycol (MIRALAX / GLYCOLAX) packet Take 17 g by mouth daily as needed. For constipation  ? Probiotic Product (ALIGN) 4 MG CAPS Take 1 capsule by mouth daily.   ? Propylene Glycol (SYSTANE COMPLETE OP) Place 1 drop into both eyes in the morning, at noon, in the evening, and at bedtime.  ? rosuvastatin (CRESTOR) 5 MG tablet TAKE 1 TABLET EVERY DAY  ? traZODone (DESYREL) 50 MG tablet Take 1/2 to 1 tablet by mouth once daily as needed  ? [DISCONTINUED] amLODipine (NORVASC) 5 MG tablet Take 5 mg by mouth daily. 1/2 tablet (2.43m)  ? [DISCONTINUED] lisinopril (ZESTRIL) 2.5 MG tablet Take 1 tablet (2.5 mg total) by mouth daily.  ? [DISCONTINUED] metFORMIN (GLUCOPHAGE) 500 MG tablet TAKE 2 TABLETS EVERY DAY WITH BREAKFAST  ? [DISCONTINUED] metoprolol tartrate (LOPRESSOR) 25 MG tablet Take 0.5 tablets (12.5 mg total) by mouth 2 (two) times daily as needed (palpitations).  ? ?No facility-administered encounter medications on file as of 08/03/2021.  ? ? ?Review of Systems  ?Constitutional:  Negative for appetite change, chills, fatigue, fever and unexpected weight change.  ?Eyes:  Negative for pain, discharge, redness, itching and visual disturbance.  ?Respiratory:  Negative for cough, chest tightness, shortness of breath and wheezing.   ?Cardiovascular:  Negative for chest pain, palpitations and leg swelling.  ?Gastrointestinal:  Positive for abdominal pain. Negative for abdominal distention, blood in stool, constipation, diarrhea, nausea and vomiting.  ?Genitourinary:  Negative for difficulty urinating, dysuria, flank pain, frequency and urgency.  ?Musculoskeletal:  Negative for arthralgias, back pain, gait problem, joint swelling, myalgias, neck pain and neck stiffness.  ?Skin:  Negative for color change, pallor and rash.  ?Neurological:  Negative for dizziness, syncope, speech difficulty, weakness, light-headedness, numbness and headaches.  ? ?Immunization History  ?Administered Date(s)  Administered  ? Fluad Quad(high Dose 65+) 02/11/2019, 02/12/2020, 01/27/2021  ? Influenza, High Dose Seasonal PF 01/28/2018  ? Influenza,inj,Quad PF,6+ Mos 02/03/2016, 01/23/2017  ? Moderna Sars-Covid-2 Vaccination 06/22/2019, 07/24/2019  ? Pneumococcal Conjugate-13 07/30/2014  ? Pneumococcal Polysaccharide-23 11/17/2005, 05/20/2012  ? Td 08/18/2010  ? ?Pertinent  Health Maintenance Due  ?Topic Date Due  ? FOOT EXAM  01/23/2018  ? HEMOGLOBIN A1C  07/31/2021  ? INFLUENZA VACCINE  11/14/2021  ? OPHTHALMOLOGY EXAM  01/11/2022  ? MAMMOGRAM  07/26/2023  ? COLONOSCOPY (Pts [id number] coverage will need to be confirmed)  09/05/2025  ? DEXA SCAN  Completed  ? ? ?  12/27/2020  ?  1:15 PM 02/03/2021  ? 12:56 PM 05/05/2021  ? 10:35 AM 06/19/2021  ?  3:26 PM 08/03/2021  ? 10:17 AM  ?Fall Risk  ?Falls in the past year? 0 0 0 1 0  ?Was there an injury with Fall? 0 0 0 1 0  ?Fall Risk Category Calculator 0 0 0 2 0  ?Fall Risk Category Low  Low Low Moderate Low  ?Patient Fall Risk Level Low fall risk  Low fall risk Moderate fall risk Low fall risk  ?Patient at Risk for Falls Due to No Fall Risks No Fall Risks No Fall Risks Impaired balance/gait No Fall Risks  ?Fall risk Follow up Falls evaluation completed Falls evaluation completed Falls evaluation completed Falls evaluation completed;Education provided;Falls prevention discussed Falls evaluation completed  ? ?Functional Status Survey: ?  ? ?Vitals:  ? 08/03/21 1015  ?BP: 116/70  ?Pulse: 71  ?Resp: 16  ?Temp: 97.7 ?F (36.5 ?C)  ?SpO2: 98%  ?Weight: 147 lb 12.8 oz (67 kg)  ?Height: _0  (1.676 m)  ? ?Body mass index is 23.86 kg/m?Marland Kitchen ?Physical Exam ?Vitals reviewed.  ?Constitutional:   ?   General: She is not in acute distress. ?   Appearance: Normal appearance. She is normal weight. She is not ill-appearing or diaphoretic.  ?HENT:  ?   Head: Normocephalic.  ?   Mouth/Throat:  ?   Mouth: Mucous membranes are moist.  ?   Pharynx: Oropharynx is clear. No oropharyngeal exudate or  posterior oropharyngeal erythema.  ?Eyes:  ?   General: No scleral icterus.    ?   Right eye: No discharge.     ?   Left eye: No discharge.  ?   Conjunctiva/sclera: Conjunctivae normal.  ?   Pupils: Pupils are equal

## 2021-08-03 NOTE — Telephone Encounter (Signed)
Left message on voicemail for patient to return call when available  to discuss blood sugar machine, as the one requested slightly differs from the one on active medication list. ? ?After clarification on blood sugar machine given we will need to route refill for crestor to [name], [name], NP ? ? ? ?

## 2021-08-03 NOTE — Telephone Encounter (Signed)
Patient called and stated that she just wanted you to know that her appointment for the CT Scan is scheduled for 08/28/2021 and that was the soonest they had.  ? ?FYI ?

## 2021-08-04 ENCOUNTER — Other Ambulatory Visit: Payer: Self-pay | Admitting: *Deleted

## 2021-08-04 DIAGNOSIS — I152 Hypertension secondary to endocrine disorders: Secondary | ICD-10-CM

## 2021-08-04 DIAGNOSIS — K219 Gastro-esophageal reflux disease without esophagitis: Secondary | ICD-10-CM

## 2021-08-04 DIAGNOSIS — E1169 Type 2 diabetes mellitus with other specified complication: Secondary | ICD-10-CM

## 2021-08-04 DIAGNOSIS — E785 Hyperlipidemia, unspecified: Secondary | ICD-10-CM | POA: Diagnosis not present

## 2021-08-04 DIAGNOSIS — E1159 Type 2 diabetes mellitus with other circulatory complications: Secondary | ICD-10-CM | POA: Diagnosis not present

## 2021-08-04 DIAGNOSIS — E1142 Type 2 diabetes mellitus with diabetic polyneuropathy: Secondary | ICD-10-CM | POA: Diagnosis not present

## 2021-08-04 NOTE — Telephone Encounter (Signed)
Left message on voicemail for patient to return call when available   

## 2021-08-04 NOTE — Telephone Encounter (Signed)
Patient notified and agreed.  

## 2021-08-04 NOTE — Telephone Encounter (Signed)
Noted.If symptoms worsen go to ED.  ?

## 2021-08-07 ENCOUNTER — Other Ambulatory Visit: Payer: Medicare HMO

## 2021-08-07 NOTE — Telephone Encounter (Signed)
Patient was in office for labs and stated she already has a glucometer that was sent to her in January 2023 and does not need another one at this time.  ? ?High risk or very high risk warning populated when attempting to refill medication (Crestor). RX request sent to PCP for review and approval if warranted.  ? ? ? ?

## 2021-08-08 LAB — CBC WITH DIFFERENTIAL/PLATELET
Absolute Monocytes: 578 cells/uL (ref 200–950)
Basophils Absolute: 59 cells/uL (ref 0–200)
Basophils Relative: 1.2 %
Eosinophils Absolute: 323 cells/uL (ref 15–500)
Eosinophils Relative: 6.6 %
HCT: 41.5 % (ref 35.0–45.0)
Hemoglobin: 13.6 g/dL (ref 11.7–15.5)
Lymphs Abs: 2352 cells/uL (ref 850–3900)
MCH: 28.5 pg (ref 27.0–33.0)
MCHC: 32.8 g/dL (ref 32.0–36.0)
MCV: 87 fL (ref 80.0–100.0)
MPV: 10 fL (ref 7.5–12.5)
Monocytes Relative: 11.8 %
Neutro Abs: 1588 cells/uL (ref 1500–7800)
Neutrophils Relative %: 32.4 %
Platelets: 153 10*3/uL (ref 140–400)
RBC: 4.77 10*6/uL (ref 3.80–5.10)
RDW: 12.7 % (ref 11.0–15.0)
Total Lymphocyte: 48 %
WBC: 4.9 10*3/uL (ref 3.8–10.8)

## 2021-08-08 LAB — COMPLETE METABOLIC PANEL WITH GFR
AG Ratio: 1.5 (calc) (ref 1.0–2.5)
ALT: 11 U/L (ref 6–29)
AST: 18 U/L (ref 10–35)
Albumin: 4.1 g/dL (ref 3.6–5.1)
Alkaline phosphatase (APISO): 46 U/L (ref 37–153)
BUN: 21 mg/dL (ref 7–25)
CO2: 29 mmol/L (ref 20–32)
Calcium: 9.5 mg/dL (ref 8.6–10.4)
Chloride: 106 mmol/L (ref 98–110)
Creat: 0.91 mg/dL (ref 0.60–1.00)
Globulin: 2.7 g/dL (calc) (ref 1.9–3.7)
Glucose, Bld: 107 mg/dL — ABNORMAL HIGH (ref 65–99)
Potassium: 4 mmol/L (ref 3.5–5.3)
Sodium: 143 mmol/L (ref 135–146)
Total Bilirubin: 0.4 mg/dL (ref 0.2–1.2)
Total Protein: 6.8 g/dL (ref 6.1–8.1)
eGFR: 66 mL/min/{1.73_m2} (ref >=60)

## 2021-08-08 LAB — HEMOGLOBIN A1C
Hgb A1c MFr Bld: 6.5 % of total Hgb — ABNORMAL HIGH (ref <5.7)
Mean Plasma Glucose: 140 mg/dL
eAG (mmol/L): 7.7 mmol/L

## 2021-08-08 LAB — LIPID PANEL
Cholesterol: 149 mg/dL (ref <200)
HDL: 73 mg/dL (ref >=50)
LDL Cholesterol (Calc): 59 mg/dL (calc)
Non-HDL Cholesterol (Calc): 76 mg/dL (calc) (ref <130)
Total CHOL/HDL Ratio: 2 (calc) (ref <5.0)
Triglycerides: 87 mg/dL (ref <150)

## 2021-08-08 LAB — TSH: TSH: 1.59 mIU/L (ref 0.40–4.50)

## 2021-08-10 ENCOUNTER — Encounter: Payer: Self-pay | Admitting: Family

## 2021-08-10 ENCOUNTER — Ambulatory Visit (INDEPENDENT_AMBULATORY_CARE_PROVIDER_SITE_OTHER): Payer: Medicare HMO | Admitting: Family

## 2021-08-10 VITALS — BP 124/78 | HR 74 | Temp 97.5°F | Ht 66.0 in | Wt 150.2 lb

## 2021-08-10 DIAGNOSIS — I739 Peripheral vascular disease, unspecified: Secondary | ICD-10-CM

## 2021-08-10 DIAGNOSIS — I152 Hypertension secondary to endocrine disorders: Secondary | ICD-10-CM | POA: Diagnosis not present

## 2021-08-10 DIAGNOSIS — E1159 Type 2 diabetes mellitus with other circulatory complications: Secondary | ICD-10-CM | POA: Diagnosis not present

## 2021-08-10 DIAGNOSIS — E1142 Type 2 diabetes mellitus with diabetic polyneuropathy: Secondary | ICD-10-CM | POA: Diagnosis not present

## 2021-08-10 DIAGNOSIS — R2681 Unsteadiness on feet: Secondary | ICD-10-CM | POA: Diagnosis not present

## 2021-08-10 DIAGNOSIS — K219 Gastro-esophageal reflux disease without esophagitis: Secondary | ICD-10-CM | POA: Diagnosis not present

## 2021-08-10 DIAGNOSIS — E039 Hypothyroidism, unspecified: Secondary | ICD-10-CM | POA: Insufficient documentation

## 2021-08-10 DIAGNOSIS — R6889 Other general symptoms and signs: Secondary | ICD-10-CM | POA: Diagnosis not present

## 2021-08-10 NOTE — Progress Notes (Signed)
? ?Provider: [name]  ? ?Aziya Arena, [name], NP ? ?Patient Care Team: ?Raeshaun Simson, [name], NP as PCP - General (Family Medicine) ?Freada Bergeron, MD as PCP - Cardiology (Cardiology) ?Dorothy Spark, MD as Consulting Physician (Cardiology) ?Clent Jacks, MD as Consulting Physician (Ophthalmology) ? ?Extended Emergency Contact Information ?Primary Emergency Contact: Ogden,[name] ?Mobile Phone: [phone] ?Relation: Daughter ? ?Code Status:  Full Code  ?Goals of care: Advanced Directive information ? ?  08/03/2021  ? 10:18 AM  ?Advanced Directives  ?Does Patient Have a Medical Advance Directive? Yes  ?Type of Advance Directive Living will  ?Does patient want to make changes to medical advance directive? No - Patient declined  ? ? ? ?Chief Complaint  ?Patient presents with  ? Medical Management of Chronic Issues  ?  6 month follow up.   ? Lab work  ?  Discuss recent lab work.  ? Health Maintenance  ?  Discuss the need for Foot exam.   ? Immunizations  ?  Discuss the need for Covid Booster.   ? ? ?HPI:  ?Sierra Ray is a 76 y.o. female seen today for medical management of chronic diseases.  Has some medical history of essential hypertension, varicose veins of both lower extremity with complications, asthma without any complication, GERD, type 2 diabetes with peripheral neuropathy, irritable bowel syndrome,Hypothyroidism, hyperlipidemia, short-term memory loss, unsteady gait among other conditions. ? ?She was here recently due to complaints of generalized abdominal pain abdominal CT scan was ordered and was scheduled for Aug 28, 2021.  States still has abdominal pain but not as severe as before.  She denies any diarrhea or constipation though states has to take medication for bowels to move.  She denies any nausea vomiting. ? ?Hgb A 1 C 6.5 previous 6.7 glucose 107 ?Has upcoming appointment with Ophthalmology 08/14/2021.  Has not been seen recently for annual foot exam.  He usually follows up with [facility] with Dr. Trudie Buckler.  She will schedule appointment with the podiatrist.  She continues to complain of neuropathy mostly has not worsened. ?Her main concern today is chronic varicose veins.  Prescription was given on previous visit to obtain thigh-high compression stockings but she did not she request another prescription to be given today.Has seen vein and vascular but states had a bad experience does not want to go back.  ? ? ?Past Medical History:  ?Diagnosis Date  ? Abnormal Pap smear 05/08/2011  ? ASC-US +HPV  ? Arthritis   ? Asthma   ? Constipation   ? Diabetes mellitus type 2 with neurological manifestations ([place])   ? Diabetic peripheral neuropathy ([place])   ? Generalized osteoarthritis   ? GERD (gastroesophageal reflux disease)   ? Hx of seasonal allergies   ? Hyperlipidemia   ? Hypertension   ? IBS (irritable bowel syndrome)   ? S/P total hysterectomy 06/21/2011  ? ?Past Surgical History:  ?Procedure Laterality Date  ? CATARACT EXTRACTION Left   ? EYE SURGERY Right 01/2018  ? Removed raised area on eye. Dr.Groat   ? OVARIAN CYST SURGERY  1986  ? PELVIC LAPAROSCOPY    ? TONSILECTOMY, ADENOIDECTOMY, BILATERAL MYRINGOTOMY AND TUBES  1974  ? VAGINAL HYSTERECTOMY  1983  ? ? ?Allergies  ?Allergen Reactions  ? Aspirin   ? Pravastatin Sodium Other (See Comments)  ?  Muscle aches  ? Restasis [Cyclosporine] Other (See Comments)  ? ? ?Allergies as of 08/10/2021   ? ?   Reactions  ? Aspirin   ?  Pravastatin Sodium Other (See Comments)  ? Muscle aches  ? Restasis [cyclosporine] Other (See Comments)  ? ?  ? ?  ?Medication List  ?  ? ?  ? Accurate as of August 10, 2021  1:02 PM. If you have any questions, ask your nurse or doctor.  ?  ?  ? ?  ? ?STOP taking these medications   ? ?traZODone 50 MG tablet ?Commonly known as: DESYREL ?Stopped by: [name], NP ?  ? ?  ? ?TAKE these medications   ? ?Accu-Chek Aviva Plus test strip ?Generic drug: glucose blood ?Use to test blood sugar twice daily. Dx:E11.42 ?   ?Accu-Chek Aviva Plus w/Device Kit ?Use to test blood sugar twice daily. Dx E11.42 ?  ?Accu-Chek Softclix Lancet Dev Kit ?Use to test blood sugar. Dx: E11.42 ?  ?Accu-Chek Softclix Lancets lancets ?Use to test blood sugar one time daily for diabetes. Dx:E11.42 ?  ?acetaminophen 500 MG tablet ?Commonly known as: TYLENOL ?Take 1 tablet (500 mg total) by mouth every 8 (eight) hours as needed for moderate pain. ?  ?Align 4 MG Caps ?Take 1 capsule by mouth daily. ?  ?amLODipine 5 MG tablet ?Commonly known as: NORVASC ?TAKE 1/2 TABLET EVERY DAY ?  ?b complex vitamins capsule ?Take 1 capsule by mouth daily. ?  ?B-D SINGLE USE SWABS REGULAR Pads ?Use to test blood sugar twice daily. Dx: E11.42 ?  ?cholecalciferol 1000 units tablet ?Commonly known as: VITAMIN D ?Take 1,000 Units by mouth daily. ?  ?fluticasone 50 MCG/ACT nasal spray ?Commonly known as: FLONASE ?Place 2 sprays into both nostrils as needed. ?  ?latanoprost 0.005 % ophthalmic solution ?Commonly known as: XALATAN ?1 drop at bedtime. ?  ?lisinopril 2.5 MG tablet ?Commonly known as: ZESTRIL ?TAKE 1 TABLET EVERY DAY ?  ?loratadine 10 MG tablet ?Commonly known as: CLARITIN ?Take 10 mg by mouth daily as needed. ?  ?metFORMIN 500 MG tablet ?Commonly known as: GLUCOPHAGE ?TAKE 2 TABLETS EVERY DAY WITH BREAKFAST ?  ?metoprolol tartrate 25 MG tablet ?Commonly known as: LOPRESSOR ?Take 0.5 tablets (12.5 mg total) by mouth 2 (two) times daily as needed (palpitations). ?  ?multivitamin-lutein Caps capsule ?Take 1 capsule by mouth daily. ?  ?multivitamin with minerals tablet ?Take 1 tablet by mouth daily. ?  ?polyethylene glycol 17 g packet ?Commonly known as: MIRALAX / GLYCOLAX ?Take 17 g by mouth daily as needed. For constipation ?  ?rosuvastatin 5 MG tablet ?Commonly known as: CRESTOR ?TAKE 1 TABLET EVERY DAY ?  ?SYSTANE COMPLETE OP ?Place 1 drop into both eyes in the morning, at noon, in the evening, and at bedtime. ?  ? ?  ? ? ?Review of Systems  ?Constitutional:   Negative for appetite change, chills, fatigue, fever and unexpected weight change.  ?HENT:  Negative for congestion, dental problem, ear discharge, ear pain, facial swelling, hearing loss, nosebleeds, postnasal drip, rhinorrhea, sinus pressure, sinus pain, sneezing, sore throat, tinnitus and trouble swallowing.   ?Eyes:  Positive for visual disturbance. Negative for pain, discharge, redness and itching.  ?     Follows up with ophthalmologist for eye floaters.  ?Respiratory:  Negative for cough, chest tightness, shortness of breath and wheezing.   ?Cardiovascular:  Negative for chest pain, palpitations and leg swelling.  ?     Varicose veins   ?Gastrointestinal:  Positive for abdominal pain. Negative for abdominal distention, blood in stool, diarrhea, nausea and vomiting.  ?     Has upcoming appointment.  Abdominal CT scan  on 08/28/2021 ?Chronic constipation  ?Endocrine: Negative for cold intolerance, heat intolerance, polydipsia, polyphagia and polyuria.  ?Genitourinary:  Negative for difficulty urinating, dysuria, flank pain, frequency and urgency.  ?Musculoskeletal:  Positive for gait problem. Negative for arthralgias, back pain, joint swelling, myalgias, neck pain and neck stiffness.  ?Skin:  Negative for color change, pallor, rash and wound.  ?Neurological:  Negative for dizziness, syncope, speech difficulty, weakness, light-headedness, numbness and headaches.  ?Hematological:  Does not bruise/bleed easily.  ?Psychiatric/Behavioral:  Negative for agitation, behavioral problems, confusion, hallucinations, self-injury, sleep disturbance and suicidal ideas. The patient is not nervous/anxious.   ? ?Immunization History  ?Administered Date(s) Administered  ? Fluad Quad(high Dose 65+) 02/11/2019, 02/12/2020, 01/27/2021  ? Influenza, High Dose Seasonal PF 01/28/2018  ? Influenza,inj,Quad PF,6+ Mos 02/03/2016, 01/23/2017  ? Moderna Sars-Covid-2 Vaccination 06/22/2019, 07/24/2019, 03/24/2020  ? PFIZER(Purple  Top)SARS-COV-2 Vaccination 04/26/2021  ? Pneumococcal Conjugate-13 07/30/2014  ? Pneumococcal Polysaccharide-23 11/17/2005, 05/20/2012  ? Td 08/18/2010, 02/16/2021  ? Tdap 02/16/2021  ? ?Pertinent  Health Maintenance Due

## 2021-08-14 ENCOUNTER — Encounter (INDEPENDENT_AMBULATORY_CARE_PROVIDER_SITE_OTHER): Payer: Self-pay

## 2021-08-14 DIAGNOSIS — Z961 Presence of intraocular lens: Secondary | ICD-10-CM | POA: Diagnosis not present

## 2021-08-14 DIAGNOSIS — H401132 Primary open-angle glaucoma, bilateral, moderate stage: Secondary | ICD-10-CM | POA: Diagnosis not present

## 2021-08-14 DIAGNOSIS — H25811 Combined forms of age-related cataract, right eye: Secondary | ICD-10-CM | POA: Diagnosis not present

## 2021-08-14 DIAGNOSIS — H43811 Vitreous degeneration, right eye: Secondary | ICD-10-CM | POA: Diagnosis not present

## 2021-08-14 DIAGNOSIS — E119 Type 2 diabetes mellitus without complications: Secondary | ICD-10-CM | POA: Diagnosis not present

## 2021-08-14 DIAGNOSIS — H16223 Keratoconjunctivitis sicca, not specified as Sjogren's, bilateral: Secondary | ICD-10-CM | POA: Diagnosis not present

## 2021-08-14 DIAGNOSIS — R6889 Other general symptoms and signs: Secondary | ICD-10-CM | POA: Diagnosis not present

## 2021-08-21 ENCOUNTER — Encounter: Payer: Self-pay | Admitting: Podiatrist

## 2021-08-21 ENCOUNTER — Ambulatory Visit: Payer: Medicare HMO | Admitting: Podiatrist

## 2021-08-21 DIAGNOSIS — M7751 Other enthesopathy of right foot: Secondary | ICD-10-CM

## 2021-08-21 DIAGNOSIS — R490 Dysphonia: Secondary | ICD-10-CM | POA: Insufficient documentation

## 2021-08-21 DIAGNOSIS — F411 Generalized anxiety disorder: Secondary | ICD-10-CM | POA: Insufficient documentation

## 2021-08-21 DIAGNOSIS — K589 Irritable bowel syndrome without diarrhea: Secondary | ICD-10-CM | POA: Insufficient documentation

## 2021-08-21 DIAGNOSIS — R6889 Other general symptoms and signs: Secondary | ICD-10-CM | POA: Diagnosis not present

## 2021-08-21 DIAGNOSIS — B351 Tinea unguium: Secondary | ICD-10-CM

## 2021-08-21 DIAGNOSIS — M150 Primary generalized (osteo)arthritis: Secondary | ICD-10-CM | POA: Insufficient documentation

## 2021-08-21 DIAGNOSIS — E1142 Type 2 diabetes mellitus with diabetic polyneuropathy: Secondary | ICD-10-CM | POA: Diagnosis not present

## 2021-08-21 DIAGNOSIS — M79609 Pain in unspecified limb: Secondary | ICD-10-CM

## 2021-08-21 DIAGNOSIS — M545 Low back pain, unspecified: Secondary | ICD-10-CM | POA: Insufficient documentation

## 2021-08-21 DIAGNOSIS — E1149 Type 2 diabetes mellitus with other diabetic neurological complication: Secondary | ICD-10-CM | POA: Insufficient documentation

## 2021-08-21 DIAGNOSIS — R109 Unspecified abdominal pain: Secondary | ICD-10-CM | POA: Insufficient documentation

## 2021-08-21 DIAGNOSIS — J309 Allergic rhinitis, unspecified: Secondary | ICD-10-CM | POA: Insufficient documentation

## 2021-08-21 DIAGNOSIS — M205X1 Other deformities of toe(s) (acquired), right foot: Secondary | ICD-10-CM | POA: Diagnosis not present

## 2021-08-21 DIAGNOSIS — R252 Cramp and spasm: Secondary | ICD-10-CM | POA: Insufficient documentation

## 2021-08-21 DIAGNOSIS — M159 Polyosteoarthritis, unspecified: Secondary | ICD-10-CM | POA: Insufficient documentation

## 2021-08-21 NOTE — Patient Instructions (Signed)
Joint Steroid Injection ?A joint steroid injection is a procedure to relieve swelling and pain in a joint. Steroids are medicines that reduce inflammation. In this procedure, your health care provider uses a syringe and a needle to inject a steroid medicine into a painful and inflamed joint. A pain-relieving medicine (anesthetic) may be injected along with the steroid.  ?What can I expect after the treatment? ?You will be able to go home after the treatment. ?It is normal to feel slight flushing for a few days after the injection. ?After the treatment, it is common to have an increase in joint pain after the anesthetic has worn off. This may happen about an hour after a short-acting anesthetic or about 8 hours after a longer-acting anesthetic. ?You should begin to feel relief from joint pain and swelling after 24 to 48 hours. Contact your health care provider if you do not begin to feel relief after 2 days. ?Follow these instructions at home: ?Injection site care ?Leave the adhesive dressing over your injection site in place until your health care provider says you can remove it. ?Check your injection site every day for signs of infection. Check for: ?More redness, swelling, or pain. ?Fluid or blood. ?Warmth. ?Pus or a bad smell. ?Activity ?Return to your normal activities as told by your health care provider. Ask your health care provider what activities are safe for you. You may be asked to limit activities that put stress on the joint for a few days. ?Do joint exercises as told by your health care provider. ?Do not take baths, swim, or use a hot tub until your health care provider approves. Ask your health care provider if you may take showers. You may only be allowed to take sponge baths. ?Managing pain, stiffness, and swelling ? ?If directed, put ice on the joint. To do this: ?Put ice in a plastic bag. ?Place a towel between your skin and the bag. ?Leave the ice on for 20 minutes, 2-3 times a day. ?Remove the  ice if your skin turns bright red. This is very important. If you cannot feel pain, heat, or cold, you have a greater risk of damage to the area. ?Raise (elevate) your joint above the level of your heart when you are sitting or lying down. ?General instructions ?Take over-the-counter and prescription medicines only as told by your health care provider. ?Do not use any products that contain nicotine or tobacco, such as cigarettes, e-cigarettes, and chewing tobacco. These can delay joint healing. If you need help quitting, ask your health care provider. ?If you have diabetes, be aware that your blood sugar may be slightly elevated for several days after the injection. ?Keep all follow-up visits. This is important. ?Contact a health care provider if you have: ?Chills or a fever. ?Any signs of infection at your injection site. ?Increased pain or swelling or no relief after 2 days. ?Summary ?A joint steroid injection is a treatment to relieve pain and swelling in a joint. ?Steroids are medicines that reduce inflammation. Your health care provider may add an anesthetic along with the steroid. ?You may have joint steroid injections as part of your arthritis treatment. ?Joint steroid injections may be repeated, but having them too often can damage a joint or the skin over the joint. ?Contact your health care provider if you have a fever, chills, or signs of infection, or if you get no relief from joint pain or swelling. ?This information is not intended to replace advice given to you by  your health care provider. Make sure you discuss any questions you have with your health care provider. ?Document Revised: 09/11/2019 Document Reviewed: 09/11/2019 ?Elsevier Patient Education ? [place]. ? ?

## 2021-08-21 NOTE — Progress Notes (Signed)
?Chief Complaint  ?Patient presents with  ? Nail Problem  ?  Trim nails   ?  ? ?HPI: Patient is 76 y.o. female who presents today for long and painful toenails which are painful with ambulation and difficult to trim. She also still has pain around her great toe joint which has not improved after getting the diabetic shoes and inserts or use of the Voltaren gel.   ? ?Patient Active Problem List  ? Diagnosis Date Noted  ? Abdominal pain 08/21/2021  ? Adaptive colitis 08/21/2021  ? Allergic rhinitis 08/21/2021  ? Anxiety state 08/21/2021  ? Cramp of limb 08/21/2021  ? Diabetes mellitus with polyneuropathy ([place]) 08/21/2021  ? Dysphonia 08/21/2021  ? Generalized OA 08/21/2021  ? LBP (low back pain) 08/21/2021  ? Primary generalized (osteo)arthritis 08/21/2021  ? Type II diabetes mellitus with neurological manifestations ([place]) 08/21/2021  ? Hypothyroidism (acquired) 08/10/2021  ? Claudication ([place]) 08/04/2020  ? Vitreous floaters, left 04/19/2020  ? Posterior vitreous detachment, left eye 04/19/2020  ? Vitreomacular traction, left 03/22/2020  ? Posterior vitreous detachment of right eye 03/22/2020  ? Nuclear sclerotic cataract of right eye 03/22/2020  ? Chronic pain of both knees 08/12/2019  ? Unsteady gait 08/12/2019  ? Slow transit constipation 08/12/2019  ? High risk medication use 07/24/2017  ? Depression 01/23/2017  ? Costochondritis 07/25/2016  ? Varicose veins of both lower extremities with complications 50/06/7046  ? Short-term memory loss 02/03/2016  ? Pain in joint, multiple sites 02/03/2016  ? Chronic seasonal allergic rhinitis due to pollen 02/03/2016  ? Gastroesophageal reflux disease without esophagitis 07/03/2015  ? Type 2 diabetes mellitus with diabetic polyneuropathy, without long-term current use of insulin ([place]) 07/03/2015  ? Hyperlipidemia LDL goal <100 07/03/2015  ? Benign essential HTN 07/03/2015  ? Encounter for gynecological examination with abnormal finding 10/01/2014  ? Mastodynia 10/01/2014  ?  Carpal tunnel syndrome 05/20/2014  ? Essential hypertension 01/15/2014  ? Palpitations 01/15/2014  ? Hyperlipidemia 01/15/2014  ? DOE (dyspnea on exertion) 01/15/2014  ? PAC (premature atrial contraction) 01/15/2014  ? S/P total hysterectomy 06/21/2011  ? ? ?Current Outpatient Medications on File Prior to Visit  ?Medication Sig Dispense Refill  ? Accu-Chek Softclix Lancets lancets Use to test blood sugar one time daily for diabetes. Dx:E11.42 300 each 1  ? acetaminophen (TYLENOL) 500 MG tablet Take 1 tablet (500 mg total) by mouth every 8 (eight) hours as needed for moderate pain. 30 tablet 0  ? Alcohol Swabs (B-D SINGLE USE SWABS REGULAR) PADS Use to test blood sugar twice daily. Dx: E11.42 200 each 3  ? amLODipine (NORVASC) 5 MG tablet TAKE 1/2 TABLET EVERY DAY 45 tablet 3  ? b complex vitamins capsule Take 1 capsule by mouth daily.    ? Blood Glucose Monitoring Suppl (ACCU-CHEK AVIVA PLUS) w/Device KIT Use to test blood sugar twice daily. Dx E11.42 1 kit 0  ? cholecalciferol (VITAMIN D) 1000 UNITS tablet Take 1,000 Units by mouth daily.    ? fluticasone (FLONASE) 50 MCG/ACT nasal spray Place 2 sprays into both nostrils as needed.     ? glucose blood (ACCU-CHEK AVIVA PLUS) test strip Use to test blood sugar twice daily. Dx:E11.42 200 strip 3  ? Lancets Misc. (ACCU-CHEK SOFTCLIX LANCET DEV) KIT Use to test blood sugar. Dx: E11.42 1 kit 0  ? latanoprost (XALATAN) 0.005 % ophthalmic solution 1 drop at bedtime.    ? lisinopril (ZESTRIL) 2.5 MG tablet TAKE 1 TABLET EVERY DAY 90 tablet 3  ?  loratadine (CLARITIN) 10 MG tablet Take 10 mg by mouth daily as needed.    ? metFORMIN (GLUCOPHAGE) 500 MG tablet TAKE 2 TABLETS EVERY DAY WITH BREAKFAST 180 tablet 1  ? metoprolol tartrate (LOPRESSOR) 25 MG tablet Take 0.5 tablets (12.5 mg total) by mouth 2 (two) times daily as needed (palpitations). 90 tablet 2  ? Multiple Vitamins-Minerals (MULTIVITAMIN WITH MINERALS) tablet Take 1 tablet by mouth daily.    ? multivitamin-lutein  (OCUVITE-LUTEIN) CAPS capsule Take 1 capsule by mouth daily.    ? polyethylene glycol (MIRALAX / GLYCOLAX) packet Take 17 g by mouth daily as needed. For constipation    ? Probiotic Product (ALIGN) 4 MG CAPS Take 1 capsule by mouth daily.     ? Propylene Glycol (SYSTANE COMPLETE OP) Place 1 drop into both eyes in the morning, at noon, in the evening, and at bedtime.    ? rosuvastatin (CRESTOR) 5 MG tablet TAKE 1 TABLET EVERY DAY 90 tablet 1  ? ?No current facility-administered medications on file prior to visit.  ? ? ?Allergies  ?Allergen Reactions  ? Aspirin   ? Pravastatin Sodium Other (See Comments)  ?  Muscle aches  ? Restasis [Cyclosporine] Other (See Comments)  ? ? ?Review of Systems ?No fevers, chills, nausea, muscle aches, no difficulty breathing, no calf pain, no chest pain or shortness of breath. ? ? ?Physical Exam ? ?GENERAL APPEARANCE: Alert, conversant. Appropriately groomed. No acute distress.  ? ?VASCULAR: Pedal pulses palpable DP and PT bilateral.  Capillary refill time is immediate to all digits,  Proximal to distal cooling it warm to warm.  Digital perfusion adequate.  ? ?NEUROLOGIC: sensation is decreased to 5.07 monofilament at 3/5 sites bilateral.  Light touch is intact bilateral, vibratory sensation intact bilateral ? ?MUSCULOSKELETAL: acceptable muscle strength, tone and stability bilateral.  Pain with dorsiflexion plantarflexion of the first metatarsal phalangeal joint of the right foot is noted.  Pain with direct pressure at the joint level itself is also seen. ? ?DERMATOLOGIC: skin is warm, supple, and dry.  Color, texture, and turgor of skin within normal limits.  Digital nails are elongated, thickened, discolored and friable and fragile consistent with onychomycosis x10. ? ? ?Assessment  ? ?  ICD-10-CM   ?1. Hallux limitus of right foot  M20.5X1   ?  ?2. Type 2 diabetes mellitus with diabetic polyneuropathy, without long-term current use of insulin (HCC)  E11.42   ?  ?3. Pain due to  onychomycosis of nail  B35.1   ? M79.609   ?  ? ? ? ?Plan ? ?Discussed exam findings with the patient.  At today's visit I recommended an injection at the first metatarsal phalangeal joint and she would like to try this after a sterile prep was performed I injected dexamethasone and Marcaine mixture into the first metatarsophalangeal joint of the right foot without complication.  She tolerated this well. ?I also trimmed her toenails with a nail nipper and a power bur I recommended follow-up in 3 months.  She will call sooner if any problems arise or if she has any questions. ?

## 2021-08-28 ENCOUNTER — Ambulatory Visit
Admission: RE | Admit: 2021-08-28 | Discharge: 2021-08-28 | Disposition: A | Discharge status: Home / Self Care | Payer: Medicare HMO | Source: Ambulatory Visit | Attending: Family | Admitting: Family

## 2021-08-28 DIAGNOSIS — R6889 Other general symptoms and signs: Secondary | ICD-10-CM | POA: Diagnosis not present

## 2021-08-28 DIAGNOSIS — K561 Intussusception: Secondary | ICD-10-CM | POA: Diagnosis not present

## 2021-08-28 DIAGNOSIS — R1084 Generalized abdominal pain: Secondary | ICD-10-CM

## 2021-08-28 DIAGNOSIS — K5790 Diverticulosis of intestine, part unspecified, without perforation or abscess without bleeding: Secondary | ICD-10-CM | POA: Diagnosis not present

## 2021-08-28 DIAGNOSIS — K429 Umbilical hernia without obstruction or gangrene: Secondary | ICD-10-CM | POA: Diagnosis not present

## 2021-08-28 DIAGNOSIS — K3189 Other diseases of stomach and duodenum: Secondary | ICD-10-CM | POA: Diagnosis not present

## 2021-08-28 MED ORDER — IOPAMIDOL (ISOVUE-300) INJECTION 61%
100.0000 mL | Freq: Once | INTRAVENOUS | Status: AC | PRN
  Administered 2021-08-28: 100 mL via INTRAVENOUS

## 2021-08-31 ENCOUNTER — Telehealth: Payer: Self-pay

## 2021-08-31 ENCOUNTER — Other Ambulatory Visit: Payer: Self-pay | Admitting: Family

## 2021-08-31 DIAGNOSIS — K561 Intussusception: Secondary | ICD-10-CM

## 2021-08-31 NOTE — Telephone Encounter (Signed)
Message received requesting copy of CT scan.  Called patient to confirm preference of route. Spoke to patient. She will pick up. Copy up front for pick up.

## 2021-09-04 ENCOUNTER — Telehealth: Payer: Self-pay | Admitting: *Deleted

## 2021-09-04 NOTE — Telephone Encounter (Signed)
Patient called and wanted to know if you could order a Small Bowel Series to be done before she went to see [facility].  Stated that the CT Scan recommended for the testing to be done.   Please Advise.

## 2021-09-04 NOTE — Telephone Encounter (Signed)
Recommend evaluation by General surgery symptoms of constipation that you have had with findings of Abdominal CT scan .

## 2021-09-04 NOTE — Telephone Encounter (Signed)
Patient notified and agreed.  

## 2021-10-18 ENCOUNTER — Telehealth: Payer: Self-pay

## 2021-10-18 DIAGNOSIS — K561 Intussusception: Secondary | ICD-10-CM

## 2021-10-18 NOTE — Telephone Encounter (Signed)
Discussed with the patient.

## 2021-10-18 NOTE — Telephone Encounter (Signed)
Patient is requesting Order for upper GI and small bowel. Radiologist recommended after CAT was done. She prefers this before seeing surgeon.

## 2021-10-18 NOTE — Telephone Encounter (Signed)
Will refer to Gastroenterology for evaluation for Upper GI and small bowel

## 2021-10-24 ENCOUNTER — Telehealth: Payer: Self-pay | Admitting: *Deleted

## 2021-10-24 NOTE — Telephone Encounter (Signed)
Patient called wanting to know the status of her GI Referral.   Information given to patient: Note:   Referral sent to [facility] they will contact patient.   [facility]   Address: [address], [place], [place] [address]   Phone: [phone] . Type Date User Summary Attachment  Provider Comments 10/18/2021 12:33 PM [name], [name], NP Provider Comments -  Note:   Intussusception of small bowel request Upper GI and small bowel evaluation.

## 2021-10-26 ENCOUNTER — Other Ambulatory Visit: Payer: Self-pay | Admitting: Family

## 2021-10-26 DIAGNOSIS — K561 Intussusception: Secondary | ICD-10-CM

## 2021-10-26 NOTE — Progress Notes (Signed)
Another GI referral ordered per pt's request would like to see Dr.Hung.

## 2021-10-29 IMAGING — MG DIGITAL SCREENING BILAT W/ TOMO W/ CAD
6 of 10 series · 6 of 30 positions shown · non-contrast
Comparison: Previous exam(s).

ACR Breast Density Category a: The breast tissue is almost entirely
fatty.

CLINICAL DATA: Screening.

EXAM:
DIGITAL SCREENING BILATERAL MAMMOGRAM WITH TOMO AND CAD

[R CC synth-2D]
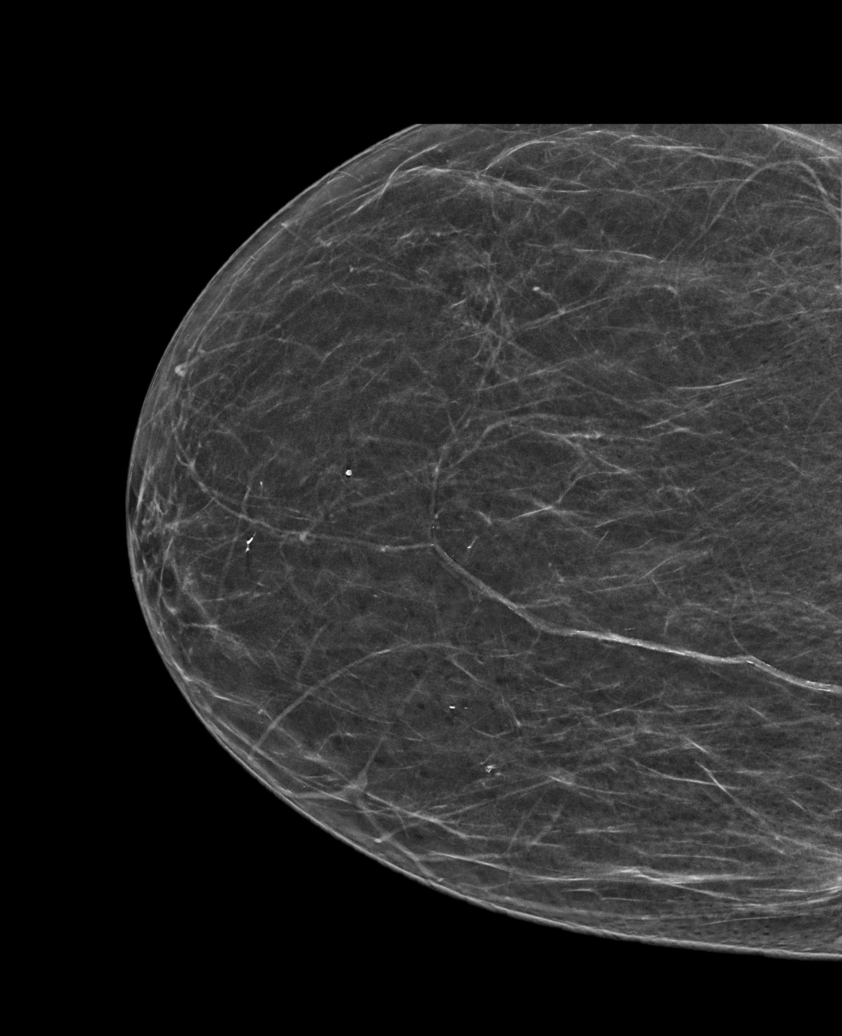

[L CC synth-2D (1 of 2)]
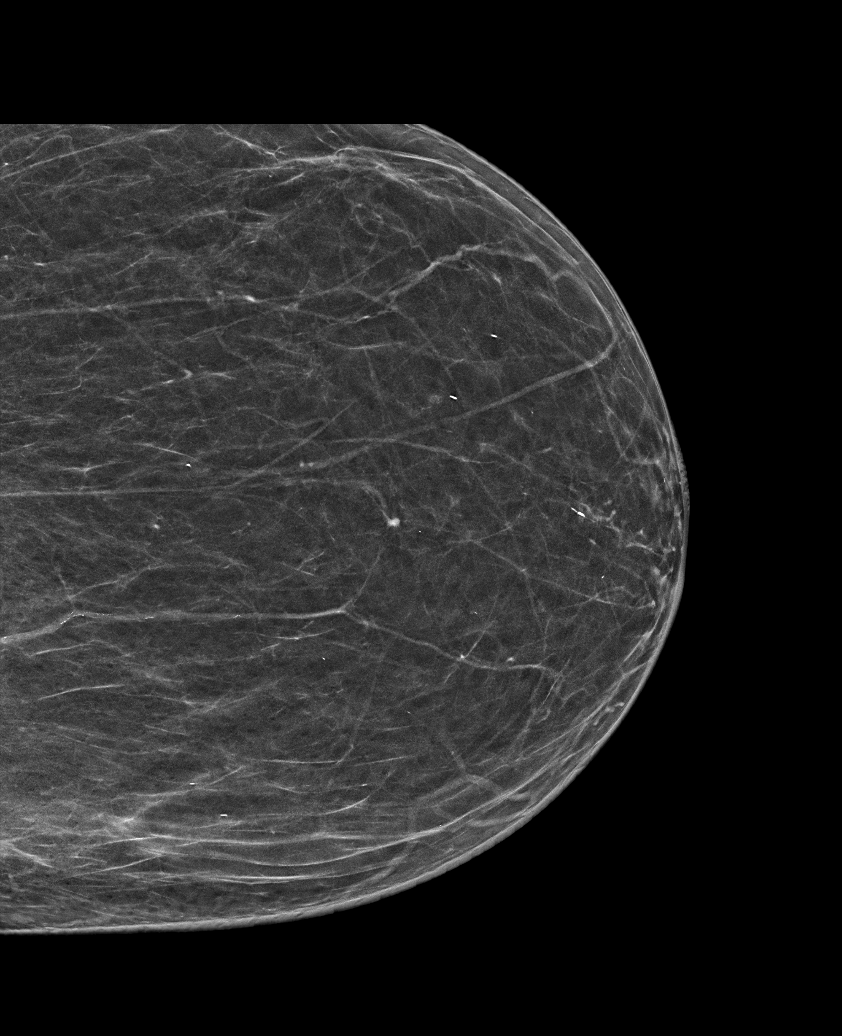

[L CC synth-2D (2 of 2)]
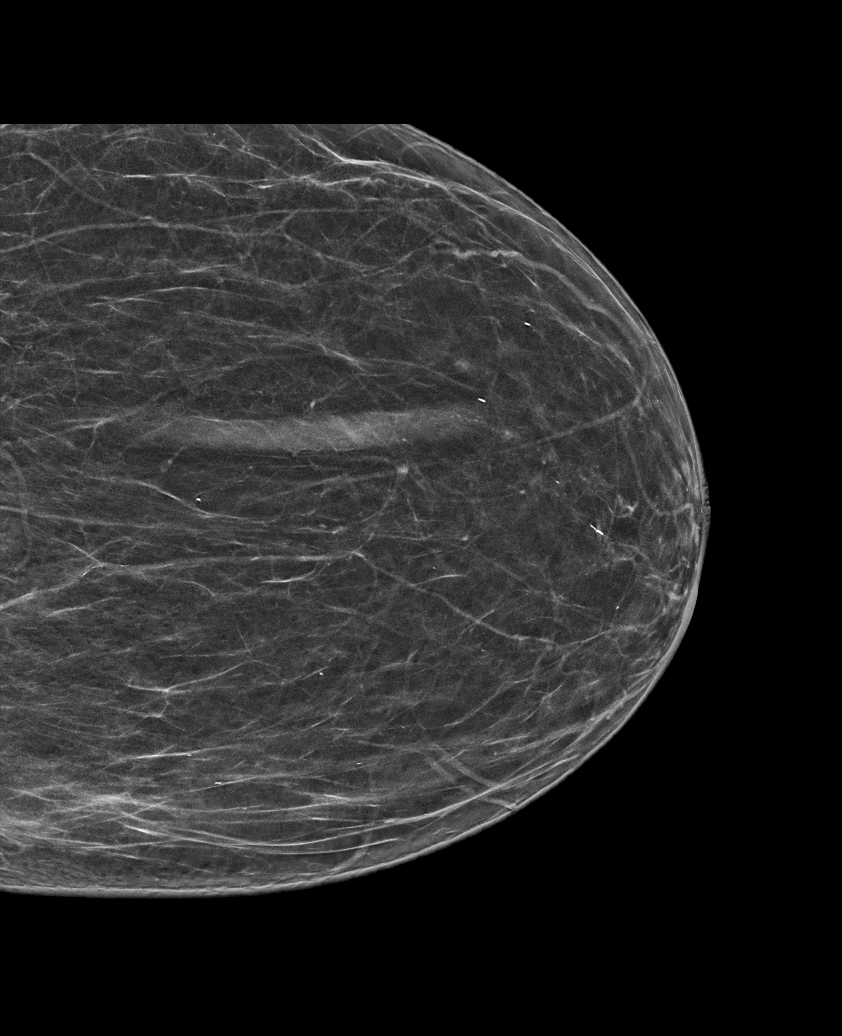

[R MLO synth-2D]
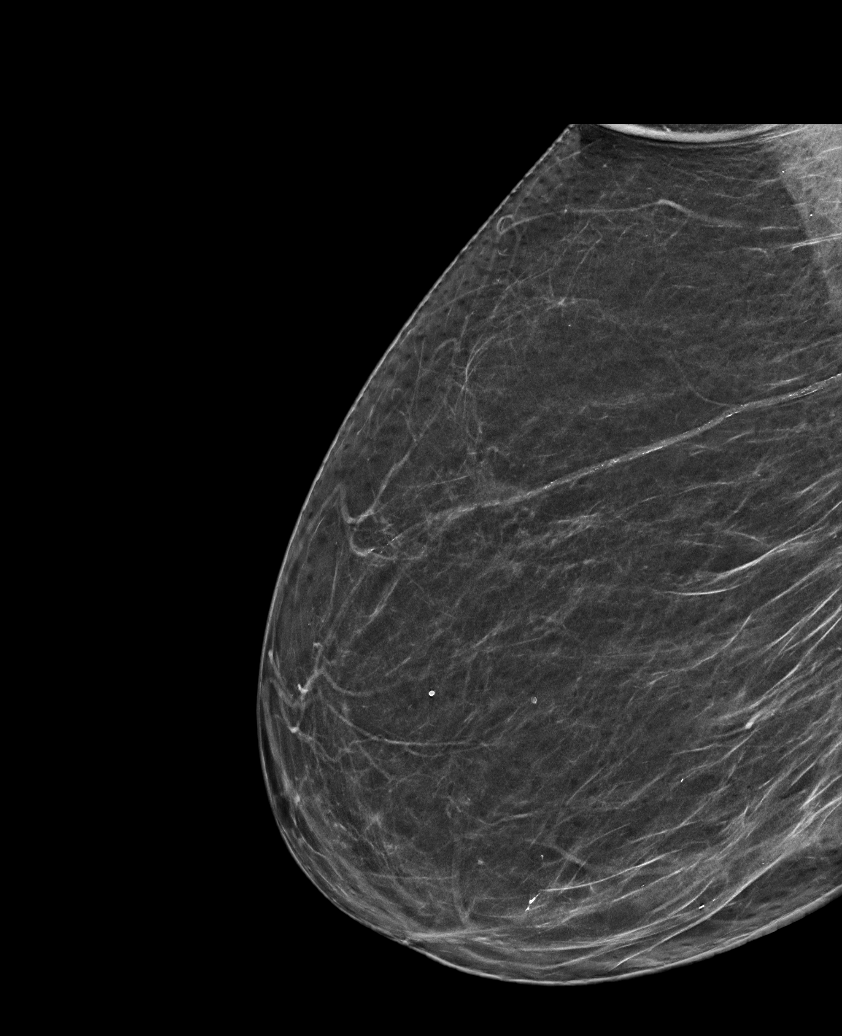

[L MLO synth-2D]
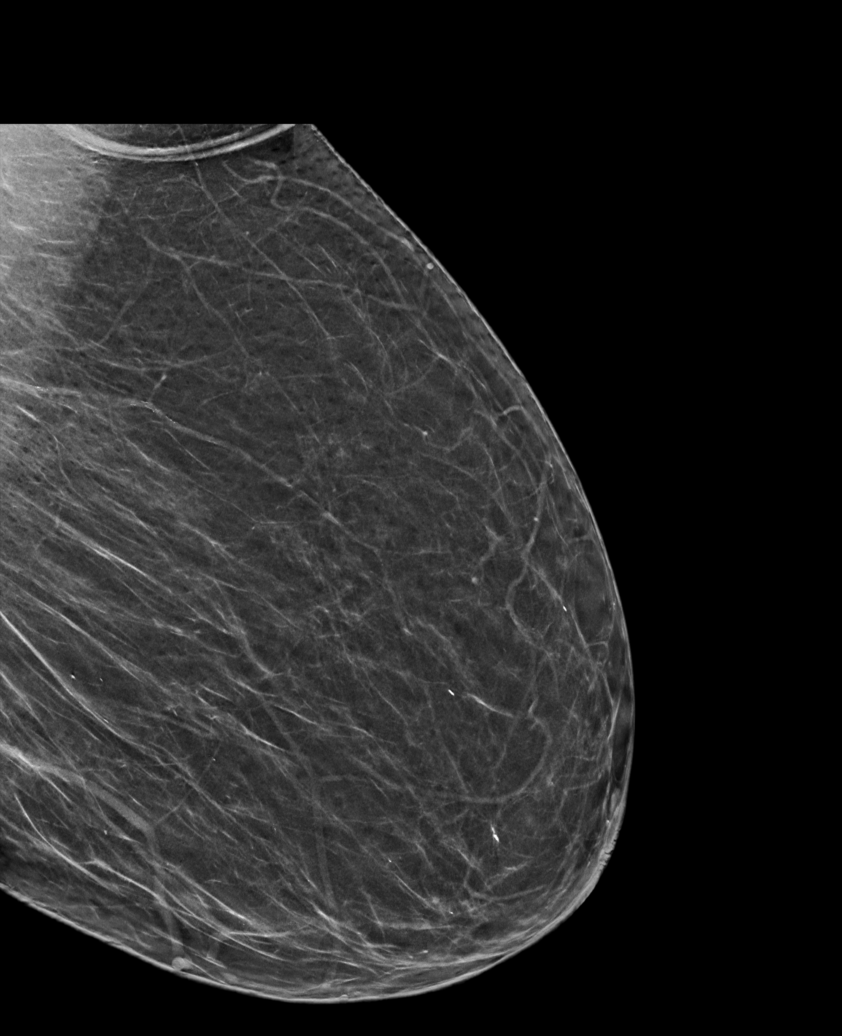

[L CC tomo · tomo slice 31/60.0]
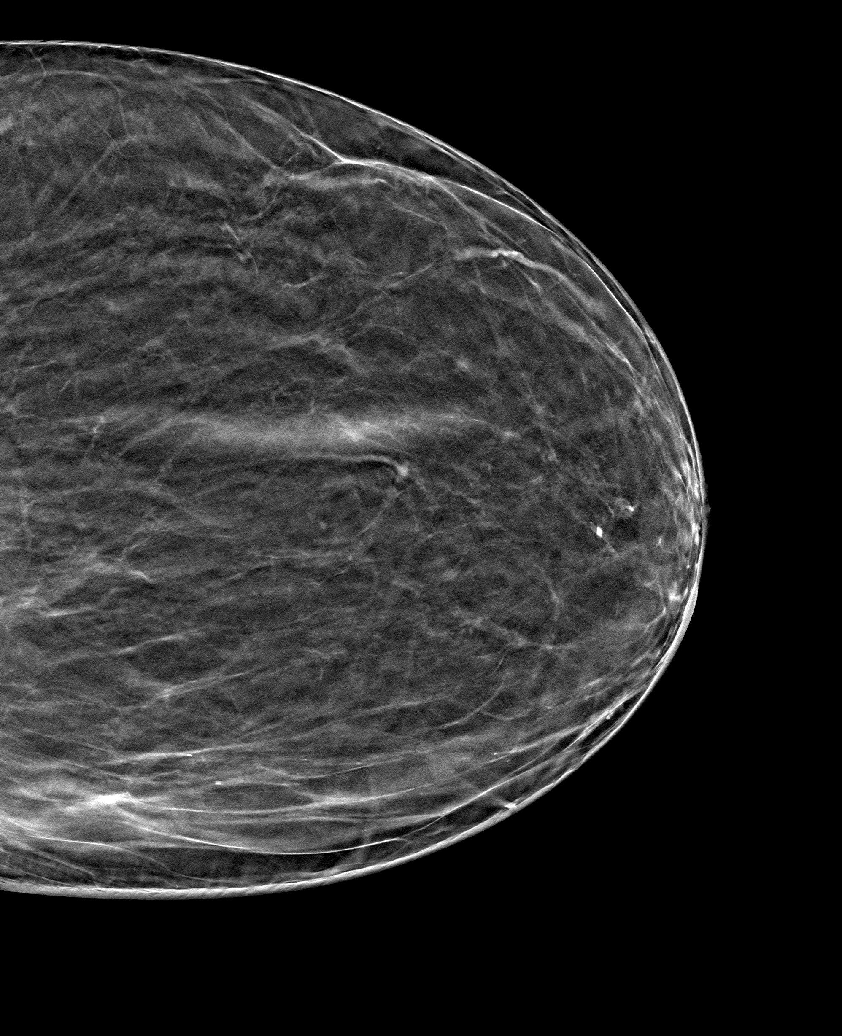

[6 of 30 positions shown; findings below may reference images not displayed]

FINDINGS: There are no findings suspicious for malignancy. Images were
processed with CAD.
IMPRESSION: No mammographic evidence of malignancy. A result letter of this
screening mammogram will be mailed directly to the patient.

RECOMMENDATION:
Screening mammogram in one year. (Code:[id number])

BI-RADS CATEGORY  1: Negative.

## 2021-10-30 DIAGNOSIS — R1084 Generalized abdominal pain: Secondary | ICD-10-CM | POA: Diagnosis not present

## 2021-10-30 DIAGNOSIS — K5900 Constipation, unspecified: Secondary | ICD-10-CM | POA: Diagnosis not present

## 2021-10-30 DIAGNOSIS — K561 Intussusception: Secondary | ICD-10-CM | POA: Diagnosis not present

## 2021-11-20 ENCOUNTER — Telehealth: Payer: Self-pay

## 2021-11-20 DIAGNOSIS — E1142 Type 2 diabetes mellitus with diabetic polyneuropathy: Secondary | ICD-10-CM

## 2021-11-20 MED ORDER — ACCU-CHEK SOFTCLIX LANCETS MISC: 1 refills | Status: DC

## 2021-11-20 MED ORDER — ACCU-CHEK SOFTCLIX LANCET DEV KIT: PACK | 0 refills | Status: AC

## 2021-11-20 NOTE — Telephone Encounter (Signed)
Refill request received from pharmacy °

## 2021-11-21 DIAGNOSIS — K5909 Other constipation: Secondary | ICD-10-CM | POA: Diagnosis not present

## 2021-11-21 DIAGNOSIS — K561 Intussusception: Secondary | ICD-10-CM | POA: Diagnosis not present

## 2021-11-22 ENCOUNTER — Other Ambulatory Visit: Payer: Self-pay | Admitting: Surgery

## 2021-11-22 ENCOUNTER — Other Ambulatory Visit (HOSPITAL_COMMUNITY): Payer: Self-pay | Admitting: Surgery

## 2021-11-22 DIAGNOSIS — K561 Intussusception: Secondary | ICD-10-CM

## 2021-11-22 DIAGNOSIS — K5909 Other constipation: Secondary | ICD-10-CM

## 2021-11-26 ENCOUNTER — Ambulatory Visit (HOSPITAL_COMMUNITY)
Admission: RE | Admit: 2021-11-26 | Discharge: 2021-11-26 | Disposition: A | Discharge status: Home / Self Care | Payer: Medicare HMO | Source: Ambulatory Visit | Attending: Surgery | Admitting: Surgery

## 2021-11-26 DIAGNOSIS — K561 Intussusception: Secondary | ICD-10-CM | POA: Diagnosis not present

## 2021-11-26 DIAGNOSIS — K5909 Other constipation: Secondary | ICD-10-CM | POA: Diagnosis not present

## 2021-11-26 DIAGNOSIS — K760 Fatty (change of) liver, not elsewhere classified: Secondary | ICD-10-CM | POA: Diagnosis not present

## 2021-11-26 MED ORDER — IOHEXOL 300 MG/ML  SOLN
100.0000 mL | Freq: Once | INTRAMUSCULAR | Status: AC | PRN
  Administered 2021-11-26: 100 mL via INTRAVENOUS

## 2021-11-26 MED ORDER — SODIUM CHLORIDE (PF) 0.9 % IJ SOLN
INTRAMUSCULAR | Status: AC
  Filled 2021-11-26: qty 50

## 2021-11-26 MED ORDER — BARIUM SULFATE 0.1 % PO SUSP
ORAL | Status: AC
  Filled 2021-11-26: qty 3

## 2021-12-05 ENCOUNTER — Encounter: Payer: Self-pay | Admitting: Family

## 2021-12-05 ENCOUNTER — Ambulatory Visit (INDEPENDENT_AMBULATORY_CARE_PROVIDER_SITE_OTHER): Payer: Medicare HMO | Admitting: Family

## 2021-12-05 VITALS — BP 130/62 | HR 79 | Temp 97.7°F | Resp 18 | Ht 66.0 in | Wt 150.0 lb

## 2021-12-05 DIAGNOSIS — G8929 Other chronic pain: Secondary | ICD-10-CM | POA: Diagnosis not present

## 2021-12-05 DIAGNOSIS — J312 Chronic pharyngitis: Secondary | ICD-10-CM

## 2021-12-05 DIAGNOSIS — M25511 Pain in right shoulder: Secondary | ICD-10-CM

## 2021-12-05 DIAGNOSIS — E1142 Type 2 diabetes mellitus with diabetic polyneuropathy: Secondary | ICD-10-CM | POA: Diagnosis not present

## 2021-12-05 DIAGNOSIS — R252 Cramp and spasm: Secondary | ICD-10-CM

## 2021-12-05 NOTE — Progress Notes (Signed)
Provider: [name]  Marylu Dudenhoeffer, [name], NP  Patient Care Team: Melodie Ashworth, [name], NP as PCP - General (Family Medicine) Freada Bergeron, MD as PCP - Cardiology (Cardiology) Dorothy Spark, MD as Consulting Physician (Cardiology) Clent Jacks, MD as Consulting Physician (Ophthalmology) Carol Ada, MD as Consulting Physician (Gastroenterology) Michael Boston, MD as Consulting Physician (General Surgery)  Extended Emergency Contact Information Primary Emergency Contact: Ervine,[name] Mobile Phone: [phone] Relation: Daughter  Code Status:  Full Code  Goals of care: Advanced Directive information    08/03/2021   10:18 AM  Advanced Directives  Does Patient Have a Medical Advance Directive? Yes  Type of Advance Directive Living will  Does patient want to make changes to medical advance directive? No - Patient declined     Chief Complaint  Patient presents with   Acute Visit    Patient is here for sore throat and sore leg    HPI:  Pt is a 76 y.o. female seen today for an acute visit for evaluation of sore in the throat.  She requests referral to ENT states had similar sore in the past and was biopsied.  She denies any fever or chills.  Stated had left neck lymph node tenderness.  Right leg cramps  -complains of right calf and toes cramping night.  Symptoms usually wakes her up at night.  Sometimes she applies over the counter topical analgesics and walks around in the room to help with the symptoms.Takes magnesium and tries to drinkwater during the day.   Right shoulder pain -stated pain limiting range of motion especially reaching to the back and above the head.  Has taken over-the-counter analgesics, ice and heat application without any relief. Recommended follow-up with orthopedic for evaluation.  Stated does not want any more cortisone injection..  Previous x-ray showed degenerative changes.  Palpitation -has had episodes of palpitations lasting  short.  Latest episode happened when she was seated watching TV.  She denies any chest pain or shortness of breath.  Has seen orthopedic in the past [place] heart monitor which was normal. Would like to see the cardiologist.  Advised to schedule appointment since she was already established.  She will notify provider if referral required.  Past Medical History:  Diagnosis Date   Abnormal Pap smear 05/08/2011   ASC-US +HPV   Arthritis    Asthma    Constipation    Diabetes mellitus type 2 with neurological manifestations (HCC)    Diabetic peripheral neuropathy (HCC)    Generalized osteoarthritis    GERD (gastroesophageal reflux disease)    Hx of seasonal allergies    Hyperlipidemia    Hypertension    IBS (irritable bowel syndrome)    S/P total hysterectomy 06/21/2011   Past Surgical History:  Procedure Laterality Date   CATARACT EXTRACTION Left    EYE SURGERY Right 01/2018   Removed raised area on eye. Dr.Groat    OVARIAN CYST SURGERY  1986   PELVIC LAPAROSCOPY     TONSILECTOMY, ADENOIDECTOMY, BILATERAL MYRINGOTOMY AND TUBES  1974   VAGINAL HYSTERECTOMY  1983    Allergies  Allergen Reactions   Aspirin    Pravastatin Sodium Other (See Comments)    Muscle aches   Restasis [Cyclosporine] Other (See Comments)    Outpatient Encounter Medications as of 12/05/2021  Medication Sig   Accu-Chek Softclix Lancets lancets Use to test blood sugar one time daily for diabetes. Dx:E11.42   acetaminophen (TYLENOL) 500 MG tablet Take 1 tablet (500 mg total)  by mouth every 8 (eight) hours as needed for moderate pain.   Alcohol Swabs (B-D SINGLE USE SWABS REGULAR) PADS Use to test blood sugar twice daily. Dx: E11.42   amLODipine (NORVASC) 5 MG tablet TAKE 1/2 TABLET EVERY DAY   b complex vitamins capsule Take 1 capsule by mouth daily.   Blood Glucose Monitoring Suppl (ACCU-CHEK AVIVA PLUS) w/Device KIT Use to test blood sugar twice daily. Dx E11.42   cholecalciferol (VITAMIN D) 1000 UNITS tablet  Take 1,000 Units by mouth daily.   fluticasone (FLONASE) 50 MCG/ACT nasal spray Place 2 sprays into both nostrils as needed.    glucose blood (ACCU-CHEK AVIVA PLUS) test strip Use to test blood sugar twice daily. Dx:E11.42   Lancets Misc. (ACCU-CHEK SOFTCLIX LANCET DEV) KIT Use to test blood sugar. Dx: E11.42   latanoprost (XALATAN) 0.005 % ophthalmic solution 1 drop at bedtime.   lisinopril (ZESTRIL) 2.5 MG tablet TAKE 1 TABLET EVERY DAY   loratadine (CLARITIN) 10 MG tablet Take 10 mg by mouth daily as needed.   metFORMIN (GLUCOPHAGE) 500 MG tablet TAKE 2 TABLETS EVERY DAY WITH BREAKFAST   metoprolol tartrate (LOPRESSOR) 25 MG tablet Take 0.5 tablets (12.5 mg total) by mouth 2 (two) times daily as needed (palpitations).   Multiple Vitamins-Minerals (MULTIVITAMIN WITH MINERALS) tablet Take 1 tablet by mouth daily.   multivitamin-lutein (OCUVITE-LUTEIN) CAPS capsule Take 1 capsule by mouth daily.   polyethylene glycol (MIRALAX / GLYCOLAX) packet Take 17 g by mouth daily as needed. For constipation   Probiotic Product (ALIGN) 4 MG CAPS Take 1 capsule by mouth daily.    Propylene Glycol (SYSTANE COMPLETE OP) Place 1 drop into both eyes in the morning, at noon, in the evening, and at bedtime.   rosuvastatin (CRESTOR) 5 MG tablet TAKE 1 TABLET EVERY DAY   No facility-administered encounter medications on file as of 12/05/2021.    Review of Systems  Constitutional:  Negative for appetite change, chills, fatigue, fever and unexpected weight change.  HENT:  Negative for congestion, ear discharge, ear pain, facial swelling, hearing loss, nosebleeds, postnasal drip, rhinorrhea, sinus pressure, sinus pain, sneezing, sore throat, tinnitus and trouble swallowing.   Eyes:  Negative for pain, discharge, redness, itching and visual disturbance.  Respiratory:  Negative for cough, chest tightness, shortness of breath and wheezing.   Cardiovascular:  Negative for chest pain, palpitations and leg swelling.   Gastrointestinal:  Negative for abdominal distention, abdominal pain, blood in stool, constipation, diarrhea, nausea and vomiting.  Musculoskeletal:  Negative for arthralgias, back pain, gait problem, joint swelling, myalgias, neck pain and neck stiffness.       Muscle and toe cramps at bedtime    Skin:  Negative for color change, pallor, rash and wound.  Neurological:  Negative for dizziness, weakness, light-headedness, numbness and headaches.    Immunization History  Administered Date(s) Administered   Fluad Quad(high Dose 65+) 02/11/2019, 02/12/2020, 01/27/2021   Influenza, High Dose Seasonal PF 01/28/2018   Influenza,inj,Quad PF,6+ Mos 02/03/2016, 01/23/2017   Moderna Sars-Covid-2 Vaccination 06/22/2019, 07/24/2019, 03/24/2020   PFIZER(Purple Top)SARS-COV-2 Vaccination 04/26/2021   Pneumococcal Conjugate-13 07/30/2014   Pneumococcal Polysaccharide-23 11/17/2005, 05/20/2012   Td 08/18/2010, 02/16/2021   Tdap 02/16/2021   Pertinent  Health Maintenance Due  Topic Date Due   FOOT EXAM  01/23/2018   INFLUENZA VACCINE  11/14/2021   OPHTHALMOLOGY EXAM  01/11/2022   HEMOGLOBIN A1C  02/03/2022   MAMMOGRAM  07/26/2023   COLONOSCOPY (Pts 45-32yr Insurance coverage will need to be confirmed)  09/05/2025   DEXA SCAN  Completed      12/27/2020    1:15 PM 02/03/2021   12:56 PM 05/05/2021   10:35 AM 06/19/2021    3:26 PM 08/03/2021   10:17 AM  Fall Risk  Falls in the past year? 0 0 0 1 0  Was there an injury with Fall? 0 0 0 1 0  Fall Risk Category Calculator 0 0 0 2 0  Fall Risk Category Low Low Low Moderate Low  Patient Fall Risk Level Low fall risk  Low fall risk Moderate fall risk Low fall risk  Patient at Risk for Falls Due to No Fall Risks No Fall Risks No Fall Risks Impaired balance/gait No Fall Risks  Fall risk Follow up Falls evaluation completed Falls evaluation completed Falls evaluation completed Falls evaluation completed;Education provided;Falls prevention discussed Falls  evaluation completed   Functional Status Survey:    Vitals:   12/05/21 1430  BP: 130/62  Pulse: 79  Resp: 18  Temp: 97.7 F (36.5 C)  TempSrc: Temporal  SpO2: 99%  Weight: 150 lb (68 kg)  Height: '5\' 6"'  (1.676 m)   Body mass index is 24.21 kg/m. Physical Exam Vitals reviewed.  Constitutional:      General: She is not in acute distress.    Appearance: Normal appearance. She is normal weight. She is not ill-appearing or diaphoretic.  HENT:     Head: Normocephalic.     Right Ear: Tympanic membrane, ear canal and external ear normal. There is no impacted cerumen.     Left Ear: Tympanic membrane, ear canal and external ear normal. There is no impacted cerumen.     Nose: Nose normal. No congestion or rhinorrhea.     Mouth/Throat:     Mouth: Mucous membranes are moist.     Pharynx: No oropharyngeal exudate or posterior oropharyngeal erythema.     Comments: Bilateral posterior pharynx small sore no exudate  Eyes:     General: No scleral icterus.       Right eye: No discharge.        Left eye: No discharge.     Extraocular Movements: Extraocular movements intact.     Conjunctiva/sclera: Conjunctivae normal.     Pupils: Pupils are equal, round, and reactive to light.  Neck:     Vascular: No carotid bruit.  Cardiovascular:     Rate and Rhythm: Normal rate and regular rhythm.     Pulses: Normal pulses.     Heart sounds: Normal heart sounds. No murmur heard.    No friction rub. No gallop.     Comments: Varicose veins  Pulmonary:     Effort: Pulmonary effort is normal. No respiratory distress.     Breath sounds: Normal breath sounds. No wheezing, rhonchi or rales.  Chest:     Chest wall: No tenderness.  Abdominal:     General: Bowel sounds are normal. There is no distension.     Palpations: Abdomen is soft. There is no mass.     Tenderness: There is no abdominal tenderness. There is no right CVA tenderness, left CVA tenderness, guarding or rebound.  Musculoskeletal:         General: No swelling or tenderness. Normal range of motion.     Cervical back: Normal range of motion. No rigidity or tenderness.     Right lower leg: No edema.     Left lower leg: No edema.  Lymphadenopathy:     Cervical: No cervical adenopathy.  Skin:  General: Skin is warm and dry.     Coloration: Skin is not pale.     Findings: No bruising, erythema, lesion or rash.  Neurological:     Mental Status: She is alert and oriented to person, place, and time.     Cranial Nerves: No cranial nerve deficit.     Sensory: No sensory deficit.     Motor: No weakness.     Coordination: Coordination normal.     Gait: Gait normal.  Psychiatric:        Speech: Speech normal.    Labs reviewed: Recent Labs    01/04/21 1043 01/30/21 0827 08/04/21 1533  NA 142 139 143  K 4.4 4.4 4.0  CL 101 103 106  CO2 '28 26 29  ' GLUCOSE 105* 101* 107*  BUN '20 23 21  ' CREATININE 0.90 0.88 0.91  CALCIUM 9.8 9.4 9.5   Recent Labs    01/30/21 0827 08/04/21 1533  AST 16 18  ALT 11 11  BILITOT 0.4 0.4  PROT 6.9 6.8   Recent Labs    01/04/21 1043 08/04/21 1533  WBC 5.6 4.9  NEUTROABS  --  1,588  HGB 14.2 13.6  HCT 42.6 41.5  MCV 86 87.0  PLT 167 153   Lab Results  Component Value Date   TSH 1.59 08/04/2021   Lab Results  Component Value Date   HGBA1C 6.5 (H) 08/04/2021   Lab Results  Component Value Date   CHOL 149 08/04/2021   HDL 73 08/04/2021   LDLCALC 59 08/04/2021   TRIG 87 08/04/2021   CHOLHDL 2.0 08/04/2021    Significant Diagnostic Results in last 30 days:  CT ENTERO ABD/PELVIS W CONTAST  Result Date: 11/27/2021 CLINICAL DATA:  Small bowel intussusception.  Chronic constipation. EXAM: CT ABDOMEN AND PELVIS WITH CONTRAST (ENTEROGRAPHY) TECHNIQUE: Multidetector CT of the abdomen and pelvis during bolus administration of intravenous contrast. Negative oral contrast was given. RADIATION DOSE REDUCTION: This exam was performed according to the departmental dose-optimization  program which includes automated exposure control, adjustment of the mA and/or kV according to patient size and/or use of iterative reconstruction technique. CONTRAST:  132m OMNIPAQUE IOHEXOL 300 MG/ML  SOLN COMPARISON:  08/28/2021. FINDINGS: Lower chest: No acute findings. Heart is mildly enlarged, stable. No pericardial or pleural effusion. Distal esophagus is grossly unremarkable. Hepatobiliary: Liver is decreased in attenuation diffusely. Liver and gallbladder are otherwise unremarkable. No biliary ductal dilatation. Pancreas: Negative. Spleen: Negative. Adrenals/Urinary Tract: Adrenal glands are unremarkable. Subcentimeter low-attenuation lesions in the kidneys are too small to characterize. No specific follow-up necessary. Ureters are decompressed. Bladder is grossly unremarkable. Stomach/Bowel: Stomach, small bowel, appendix and colon are unremarkable. Specifically, no evidence of small bowel intussusception. Vascular/Lymphatic: Atherosclerotic calcification of the aorta. No pathologically enlarged lymph nodes. Reproductive: Hysterectomy.  No adnexal mass. Other: No free fluid.  Mesenteries and peritoneum are unremarkable. Musculoskeletal: Degenerative changes in the spine. No worrisome lytic or sclerotic lesions. IMPRESSION: 1. No evidence of small bowel intussusception. 2. Hepatic steatosis. 3.  Aortic atherosclerosis (ICD10-I70.0). Electronically Signed   By: MLorin PicketM.D.   On: 11/27/2021 09:21    Assessment/Plan 1. Cramp of limb Worst at bedtime suspect possible electrolytes related verse ongoing vascular pain.  Will check mg and electrolytes  - encourage to exercise by walking  - Magnesium - BMP with eGFR([facility])  2. Chronic right shoulder pain Worsening symptoms declines any analgesics.Heat and topical analgesic ineffective.will refer to Orthopedic though states does not want cortisol injection. - Ambulatory referral  to Orthopedic Surgery  3. Type 2 diabetes mellitus with diabetic  polyneuropathy, without long-term current use of insulin (HCC) Lab Results  Component Value Date   HGBA1C 6.5 (H) 08/04/2021  Requires annual eye exam.will refer to Ophthalmology request Dr.Shapiro  - Ambulatory referral to Ophthalmology  4. Chronic sore throat Chronic  Afebrile X 2 sore areas noted on posterior pharynx will have ENT to evaluation ongoing symptoms. - Ambulatory referral to ENT  Family/ staff Communication: Reviewed plan of care with patient less understanding  Labs/tests ordered: None   Next Appointment: Return if symptoms worsen or fail to improve.   [name], NP

## 2021-12-06 LAB — BASIC METABOLIC PANEL WITH GFR
BUN: 23 mg/dL (ref 7–25)
CO2: 29 mmol/L (ref 20–32)
Calcium: 10.1 mg/dL (ref 8.6–10.4)
Chloride: 99 mmol/L (ref 98–110)
Creat: 0.89 mg/dL (ref 0.60–1.00)
Glucose, Bld: 94 mg/dL (ref 65–139)
Potassium: 4 mmol/L (ref 3.5–5.3)
Sodium: 139 mmol/L (ref 135–146)
eGFR: 68 mL/min/{1.73_m2} (ref >=60)

## 2021-12-06 LAB — MAGNESIUM: Magnesium: 2.2 mg/dL (ref 1.5–2.5)

## 2021-12-08 NOTE — Progress Notes (Unsigned)
Cardiology Office Note:    Date:  12/08/2021   ID:  Sierra Mijangos V., DOB December 21, 1945, MRN [id number]  PCP:  Sierra Hughs, NP   [facility] Cardiologist:  Freada Bergeron, MD {  Referring MD: Sierra Hughs, NP    History of Present Illness:    Sierra Garciaperez V. is a 76 y.o. female with a hx of HTN, HLD, asthma, and DMII who was previously followed by Dr. Meda Ray who now returns to clinic for follow-up.  Saw Dr. Meda Ray on 08/27/19 for right sided chest pain. Coronary CTA 09/2019 with no evidence of CAD with Ca score 0. Saw Sierra Ray in 12/2020 where she was having dizziness, weakness and DOE. TTE 01/2021 with EF 65-70%, G1DD, normal RV, trivial MR, normal RAP. Cardiac monitor with NSR, rare ectopy.   Today, ***  Past Medical History:  Diagnosis Date   Abnormal Pap smear 05/08/2011   ASC-US +HPV   Arthritis    Asthma    Constipation    Diabetes mellitus type 2 with neurological manifestations (HCC)    Diabetic peripheral neuropathy (HCC)    Generalized osteoarthritis    GERD (gastroesophageal reflux disease)    Hx of seasonal allergies    Hyperlipidemia    Hypertension    IBS (irritable bowel syndrome)    S/P total hysterectomy 06/21/2011    Past Surgical History:  Procedure Laterality Date   CATARACT EXTRACTION Left    EYE SURGERY Right 01/2018   Removed raised area on eye. Dr.Groat    OVARIAN CYST SURGERY  1986   PELVIC LAPAROSCOPY     TONSILECTOMY, ADENOIDECTOMY, BILATERAL MYRINGOTOMY AND TUBES  1974   VAGINAL HYSTERECTOMY  1983    Current Medications: No outpatient medications have been marked as taking for the 12/11/21 encounter (Appointment) with Freada Bergeron, MD.     Allergies:   Aspirin, Pravastatin sodium, and Restasis [cyclosporine]   Social History   Socioeconomic History   Marital status: Divorced    Spouse name: Not on file   Number of children: 4   Years of education: Not on file   Highest  education level: Not on file  Occupational History    Employer: RETIRED  Tobacco Use   Smoking status: Never   Smokeless tobacco: Never  Vaping Use   Vaping Use: Never used  Substance and Sexual Activity   Alcohol use: No   Drug use: No   Sexual activity: Not Currently    Birth control/protection: Surgical    Comment: 1st intercourse- 20, partners- 45,   Other Topics Concern   Not on file  Social History Narrative   Diet?    Low fat, low carb, low salt      Do you drink/eat things with caffeine?      Marital status?  single                                  What year were you married?      Do you live in a house, apartment, assisted living, condo, trailer, etc.?      Is it one or more stories?      How many persons live in your home?      Do you have any pets in your home? (please list)      Current or past profession:      Do you exercise?   yes  Type & how often? Yoga, water aerobics 1-2x/week      Do you have a living will?      Do you have a DNR form?                                  If not, do you want to discuss one?      Do you have signed POA/HPOA for forms?       Social Determinants of Health   Financial Resource Strain: Low Risk  (07/24/2017)   Overall Financial Resource Strain (CARDIA)    Difficulty of Paying Living Expenses: Not hard at all  Food Insecurity: No Food Insecurity (07/24/2017)   Hunger Vital Sign    Worried About Running Out of Food in the Last Year: Never true    Ran Out of Food in the Last Year: Never true  Transportation Needs: No Transportation Needs (07/24/2017)   PRAPARE - Hydrologist (Medical): No    Lack of Transportation (Non-Medical): No  Physical Activity: Sufficiently Active (07/24/2017)   Exercise Vital Sign    Days of Exercise per Week: 3 days    Minutes of Exercise per Session: 50 min  Stress: No Stress Concern Present (07/24/2017)   [place]    Feeling of Stress : Not at all  Social Connections: Somewhat Isolated (07/24/2017)   Social Connection and Isolation Panel [NHANES]    Frequency of Communication with Friends and Family: More than three times a week    Frequency of Social Gatherings with Friends and Family: More than three times a week    Attends Religious Services: More than 4 times per year    Active Member of [employer] or Organizations: No    Attends Archivist Meetings: Never    Marital Status: Never married     Family History: The patient's ***family history includes Breast cancer in her cousin, cousin, maternal aunt, and paternal aunt; Cancer in her mother and paternal grandmother; Diabetes in her father and mother; Heart disease in her father; Pancreatic cancer in her brother and sister.  ROS:   Please see the history of present illness.    *** All other systems reviewed and are negative.  EKGs/Labs/Other Studies Reviewed:    The following studies were reviewed today: TTE 2021-02-21: IMPRESSIONS     1. Left ventricular ejection fraction, by estimation, is 65 to 70%. The  left ventricle has normal function. The left ventricle has no regional  wall motion abnormalities. There is mild left ventricular hypertrophy.  Left ventricular diastolic parameters  are consistent with Grade I diastolic dysfunction (impaired relaxation).   2. Right ventricular systolic function is normal. The right ventricular  size is normal. There is normal pulmonary artery systolic pressure. The  estimated right ventricular systolic pressure is 29.5 mmHg.   3. The mitral valve is abnormal. Trivial mitral valve regurgitation.   4. The aortic valve is tricuspid. Aortic valve regurgitation is not  visualized.   5. The inferior vena cava is normal in size with greater than 50%  respiratory variability, suggesting right atrial pressure of 3 mmHg.   Comparison(s): 07/16/2017 prior EF 60%.    February 21, 2021: Patch Wear Time:  7 days and 3 hours (2022-09-21T10:34:05-398 to 2022-09-28T14:08:52-0400)     Predominant rhythm:   SInus rhythm   Rate 48 to 128 bpm  Average HR  74 bpm    Short burst of SVT longest 18 beats at 117 bpm; fastest 4 beats at 184 bpm    Rare PVCs, PACs    No triggered events    CTA Coronary 09/2019: FINDINGS: A 100 kV prospective scan was triggered in the descending thoracic aorta at 111 HU's. Axial non-contrast 3 mm slices were carried out through the heart. The data set was analyzed on a dedicated work station and scored using the [place]. Gantry rotation speed was 250 msecs and collimation was .6 mm. 50 mg of PO Metoprolol and 0.8 mg of sl NTG was given. The 3D data set was reconstructed in 5% intervals of the 67-82 % of the R-R cycle. Diastolic phases were analyzed on a dedicated work station using MPR, MIP and VRT modes. The patient received 80 cc of contrast.   Aorta: Normal size. Mild diffuse atherosclerotic plaque. No dissection.   Aortic Valve:  Trileaflet.  Trivial calcifications.   Coronary Arteries:  Normal coronary origin.  Left dominance.   RCA is a non dominant and diminutive.   Left main is a large artery that gives rise to LAD and LCX arteries. Left main has no plaque.   LAD is a large vessel that wraps around the apex and has no plaque. Distal LAD partially supplies PDA territory.   LCX is a large dominant artery that gives rise to two large OM branches and PLA. There is no plaque.   Other findings:   Normal pulmonary vein drainage into the left atrium.   Normal left atrial appendage without a thrombus.   Moderately dilated pulmonary artery measuring 38 mm suggestive of pulmonary hypertension.   IMPRESSION: 1. Coronary calcium score of 0. This was 0 percentile for age and sex matched control.   2. Normal coronary origin with left dominance.   3. CAD-RADS 0. No evidence of CAD (0%). Consider  non-atherosclerotic causes of chest pain.   4. Moderately dilated pulmonary artery measuring 38 mm suggestive of pulmonary hypertension.  EKG:  EKG is *** ordered today.  The ekg ordered today demonstrates ***  Recent Labs: 08/04/2021: ALT 11; Hemoglobin 13.6; Platelets 153; TSH 1.59 12/05/2021: BUN 23; Creat 0.89; Magnesium 2.2; Potassium 4.0; Sodium 139  Recent Lipid Panel    Component Value Date/Time   CHOL 149 08/04/2021 1533   CHOL 139 09/26/2015 0837   TRIG 87 08/04/2021 1533   HDL 73 08/04/2021 1533   HDL 57 09/26/2015 0837   CHOLHDL 2.0 08/04/2021 1533   VLDL 29 07/03/2016 0825   LDLCALC 59 08/04/2021 1533     Risk Assessment/Calculations:   {Does this patient have ATRIAL FIBRILLATION?:[phone]}  No BP recorded.  {Refresh Note OR Click here to enter BP  :1}***         Physical Exam:    VS:  There were no vitals taken for this visit.    Wt Readings from Last 3 Encounters:  12/05/21 150 lb (68 kg)  08/10/21 150 lb 3.2 oz (68.1 kg)  08/03/21 147 lb 12.8 oz (67 kg)     GEN: *** Well nourished, well developed in no acute distress HEENT: Normal NECK: No JVD; No carotid bruits LYMPHATICS: No lymphadenopathy CARDIAC: ***RRR, no murmurs, rubs, gallops RESPIRATORY:  Clear to auscultation without rales, wheezing or rhonchi  ABDOMEN: Soft, non-tender, non-distended MUSCULOSKELETAL:  No edema; No deformity  SKIN: Warm and dry NEUROLOGIC:  Alert and oriented x 3 PSYCHIATRIC:  Normal affect   ASSESSMENT:    No diagnosis  found. PLAN:    In order of problems listed above:  #Palpitations: Improved. Cardia monitor with no significant arrhythmias. Rare ectopy.  #Dyspnea on Exertion: Reassuring work-up with coronary CTA in 2021 with no disease, Ca score 0. TTE with LVEF 60-65%, no significant valve disease.  #HLD: -Continue crestor '5mg'$  daily  #HTN: -Continue metop 12.'5mg'$  BD -Continue lisinopril 2.'5mg'$  daily -Continue amlodipine '5mg'$  daily      {Are you  ordering a CV Procedure (e.g. stress test, cath, DCCV, TEE, etc)?   Press F2        :[id number]}    Medication Adjustments/Labs and Tests Ordered: Current medicines are reviewed at length with the patient today.  Concerns regarding medicines are outlined above.  No orders of the defined types were placed in this encounter.  No orders of the defined types were placed in this encounter.   There are no Patient Instructions on file for this visit.   Signed, Freada Bergeron, MD  12/08/2021 2:34 PM    [place]

## 2021-12-11 ENCOUNTER — Encounter: Payer: Self-pay | Admitting: Cardiology

## 2021-12-11 ENCOUNTER — Ambulatory Visit: Payer: Medicare HMO | Attending: Cardiology | Admitting: Cardiology

## 2021-12-11 VITALS — BP 118/66 | HR 67 | Ht 66.0 in | Wt 148.0 lb

## 2021-12-11 DIAGNOSIS — R072 Precordial pain: Secondary | ICD-10-CM

## 2021-12-11 DIAGNOSIS — E782 Mixed hyperlipidemia: Secondary | ICD-10-CM | POA: Diagnosis not present

## 2021-12-11 DIAGNOSIS — R002 Palpitations: Secondary | ICD-10-CM | POA: Diagnosis not present

## 2021-12-11 DIAGNOSIS — R0602 Shortness of breath: Secondary | ICD-10-CM

## 2021-12-11 DIAGNOSIS — I10 Essential (primary) hypertension: Secondary | ICD-10-CM

## 2021-12-11 NOTE — Patient Instructions (Signed)
Medication Instructions:   Your physician recommends that you continue on your current medications as directed. Please refer to the Current Medication list given to you today.  *If you need a refill on your cardiac medications before your next appointment, please call your pharmacy*    Follow-Up: At [facility], you and your health needs are our priority.  As part of our continuing mission to provide you with exceptional heart care, we have created designated Provider Care Teams.  These Care Teams include your primary Cardiologist (physician) and Advanced Practice Providers (APPs -  Physician Assistants and Nurse Practitioners) who all work together to provide you with the care you need, when you need it.  We recommend signing up for the patient portal called "MyChart".  Sign up information is provided on this After Visit Summary.  MyChart is used to connect with patients for Virtual Visits (Telemedicine).  Patients are able to view lab/test results, encounter notes, upcoming appointments, etc.  Non-urgent messages can be sent to your provider as well.   To learn more about what you can do with MyChart, go to [email or web address].    Your next appointment:   1 year(s)  The format for your next appointment:   In Person  Provider:   Heather E Pemberton, MD       Important Information About Sugar       

## 2021-12-11 NOTE — Progress Notes (Signed)
Cardiology Office Note:    Date:  12/11/2021   ID:  Sierra Pritz V., DOB 08-10-1945, MRN [id number]  PCP:  [name], NP   [place] [facility] Cardiologist:  Freada Bergeron, MD {  Referring MD: [name], NP    History of Present Illness:    Sierra Houchen V. is a 76 y.o. female with a hx of HTN, HLD, asthma, and DMII who was previously followed by Dr. [name] who now returns to clinic for follow-up.  Saw Dr. [name] on 08/27/19 for right sided chest pain. Coronary CTA 09/2019 with no evidence of CAD with Ca score 0. Saw [name] in 12/2020 where she was having dizziness, weakness and DOE. TTE 01/2021 with EF 65-70%, G1DD, normal RV, trivial MR, normal RAP. Cardiac monitor with NSR, rare ectopy.   Today, the patient states that she sometimes experiences palpitations. However, these have not occurred within the past 2 weeks.  She also complains of occasional shortness of breath and chest discomfort. These episodes are infrequent, and she is not certain how long they last. Her most recent episode was about 3 weeks to 1 month ago. She believes it woke her up at night. She denies any correlation between her chest discomfort and walking/exercise.   Additionally she complains of significant discomfort and muscle cramping in her bilateral legs and toes. For the past 2 months she has been taking magnesium supplements. Of note, she sometimes forgets to take her rosuvastatin during dinner. She continues to experience her muscle cramping with the infrequent missed doses. She denies any issues with LE edema.  At home her blood pressure normally averages in the 852D systolic. In 2021 she was started on lisinopril. She then began to notice worsening "rocky" stools over the next 2 years. She has now stopped her lisinopril and her symptoms have been improving.  Typically she makes sure to drink a lot of water and she doesn't skip meals. Reportedly her most  recent A1C was 6.7.  She denies any lightheadedness, headaches, syncope, orthopnea, or PND.   Past Medical History:  Diagnosis Date   Abnormal Pap smear 05/08/2011   ASC-US +HPV   Arthritis    Asthma    Constipation    Diabetes mellitus type 2 with neurological manifestations (HCC)    Diabetic peripheral neuropathy (HCC)    Generalized osteoarthritis    GERD (gastroesophageal reflux disease)    Hx of seasonal allergies    Hyperlipidemia    Hypertension    IBS (irritable bowel syndrome)    S/P total hysterectomy 06/21/2011    Past Surgical History:  Procedure Laterality Date   CATARACT EXTRACTION Left    EYE SURGERY Right 01/2018   Removed raised area on eye. Dr.Groat    OVARIAN CYST SURGERY  1986   PELVIC LAPAROSCOPY     TONSILECTOMY, ADENOIDECTOMY, BILATERAL MYRINGOTOMY AND TUBES  1974   VAGINAL HYSTERECTOMY  1983    Current Medications: Current Meds  Medication Sig   Accu-Chek Softclix Lancets lancets Use to test blood sugar one time daily for diabetes. Dx:E11.42   acetaminophen (TYLENOL) 500 MG tablet Take 1 tablet (500 mg total) by mouth every 8 (eight) hours as needed for moderate pain.   Alcohol Swabs (B-D SINGLE USE SWABS REGULAR) PADS Use to test blood sugar twice daily. Dx: E11.42   amLODipine (NORVASC) 5 MG tablet TAKE 1/2 TABLET EVERY DAY   b complex vitamins capsule Take 1 capsule by mouth daily.   Blood Glucose  Monitoring Suppl (ACCU-CHEK AVIVA PLUS) w/Device KIT Use to test blood sugar twice daily. Dx E11.42   cholecalciferol (VITAMIN D) 1000 UNITS tablet Take 1,000 Units by mouth daily.   fluticasone (FLONASE) 50 MCG/ACT nasal spray Place 2 sprays into both nostrils as needed.    glucose blood (ACCU-CHEK AVIVA PLUS) test strip Use to test blood sugar twice daily. Dx:E11.42   Lancets Misc. (ACCU-CHEK SOFTCLIX LANCET DEV) KIT Use to test blood sugar. Dx: E11.42   latanoprost (XALATAN) 0.005 % ophthalmic solution 1 drop at bedtime.   loratadine (CLARITIN) 10  MG tablet Take 10 mg by mouth daily as needed.   metFORMIN (GLUCOPHAGE) 500 MG tablet TAKE 2 TABLETS EVERY DAY WITH BREAKFAST   metoprolol tartrate (LOPRESSOR) 25 MG tablet Take 0.5 tablets (12.5 mg total) by mouth 2 (two) times daily as needed (palpitations).   Multiple Vitamins-Minerals (MULTIVITAMIN WITH MINERALS) tablet Take 1 tablet by mouth daily.   multivitamin-lutein (OCUVITE-LUTEIN) CAPS capsule Take 1 capsule by mouth daily.   polyethylene glycol (MIRALAX / GLYCOLAX) packet Take 17 g by mouth daily as needed. For constipation   Probiotic Product (ALIGN) 4 MG CAPS Take 1 capsule by mouth daily.    Propylene Glycol (SYSTANE COMPLETE OP) Place 1 drop into both eyes in the morning, at noon, in the evening, and at bedtime.   rosuvastatin (CRESTOR) 5 MG tablet TAKE 1 TABLET EVERY DAY   [DISCONTINUED] lisinopril (ZESTRIL) 2.5 MG tablet TAKE 1 TABLET EVERY DAY     Allergies:   Aspirin, Pravastatin sodium, Restasis [cyclosporine], and Lisinopril   Social History   Socioeconomic History   Marital status: Divorced    Spouse name: Not on file   Number of children: 4   Years of education: Not on file   Highest education level: Not on file  Occupational History    Employer: RETIRED  Tobacco Use   Smoking status: Never   Smokeless tobacco: Never  Vaping Use   Vaping Use: Never used  Substance and Sexual Activity   Alcohol use: No   Drug use: No   Sexual activity: Not Currently    Birth control/protection: Surgical    Comment: 1st intercourse- 20, partners- 61,   Other Topics Concern   Not on file  Social History Narrative   Diet?    Low fat, low carb, low salt      Do you drink/eat things with caffeine?      Marital status?  single                                  What year were you married?      Do you live in a house, apartment, assisted living, condo, trailer, etc.?      Is it one or more stories?      How many persons live in your home?      Do you have any pets in  your home? (please list)      Current or past profession:      Do you exercise?   yes                 Type & how often? Yoga, water aerobics 1-2x/week      Do you have a living will?      Do you have a DNR form?  If not, do you want to discuss one?      Do you have signed POA/HPOA for forms?       Social Determinants of Health   Financial Resource Strain: Low Risk  (07/24/2017)   Overall Financial Resource Strain (CARDIA)    Difficulty of Paying Living Expenses: Not hard at all  Food Insecurity: No Food Insecurity (07/24/2017)   Hunger Vital Sign    Worried About Running Out of Food in the Last Year: Never true    Ran Out of Food in the Last Year: Never true  Transportation Needs: No Transportation Needs (07/24/2017)   PRAPARE - Hydrologist (Medical): No    Lack of Transportation (Non-Medical): No  Physical Activity: Sufficiently Active (07/24/2017)   Exercise Vital Sign    Days of Exercise per Week: 3 days    Minutes of Exercise per Session: 50 min  Stress: No Stress Concern Present (07/24/2017)   [place]    Feeling of Stress : Not at all  Social Connections: Somewhat Isolated (07/24/2017)   Social Connection and Isolation Panel [NHANES]    Frequency of Communication with Friends and Family: More than three times a week    Frequency of Social Gatherings with Friends and Family: More than three times a week    Attends Religious Services: More than 4 times per year    Active Member of [employer] or Organizations: No    Attends Archivist Meetings: Never    Marital Status: Never married     Family History: The patient's family history includes Breast cancer in her cousin, cousin, maternal aunt, and paternal aunt; Cancer in her mother and paternal grandmother; Diabetes in her father and mother; Heart disease in her father; Pancreatic cancer in her  brother and sister.  ROS:   Review of Systems  Constitutional:  Negative for chills and fever.  HENT:  Negative for nosebleeds and tinnitus.   Eyes:  Negative for photophobia and redness.  Respiratory:  Negative for hemoptysis and wheezing.   Cardiovascular:  Negative for chest pain (Last episode 1 month ago), palpitations (Last occurred 2 weeks ago), orthopnea, claudication, leg swelling and PND.  Gastrointestinal:  Negative for blood in stool, diarrhea and vomiting.  Genitourinary:  Negative for dysuria.  Musculoskeletal:  Positive for myalgias (Bilateral LE). Negative for falls.  Neurological:  Negative for tingling and seizures.  Psychiatric/Behavioral:  Negative for suicidal ideas. The patient is not nervous/anxious.      EKGs/Labs/Other Studies Reviewed:    The following studies were reviewed today:  TTE 01/2021: IMPRESSIONS    1. Left ventricular ejection fraction, by estimation, is 65 to 70%. The  left ventricle has normal function. The left ventricle has no regional  wall motion abnormalities. There is mild left ventricular hypertrophy.  Left ventricular diastolic parameters  are consistent with Grade I diastolic dysfunction (impaired relaxation).   2. Right ventricular systolic function is normal. The right ventricular  size is normal. There is normal pulmonary artery systolic pressure. The  estimated right ventricular systolic pressure is 09.3 mmHg.   3. The mitral valve is abnormal. Trivial mitral valve regurgitation.   4. The aortic valve is tricuspid. Aortic valve regurgitation is not  visualized.   5. The inferior vena cava is normal in size with greater than 50%  respiratory variability, suggesting right atrial pressure of 3 mmHg.   Comparison(s): 07/16/2017 prior EF 60%.   Monitor 01/2021:  Patch Wear Time:  7 days and 3 hours (2022-09-21T10:34:05-398 to 2022-09-28T14:08:52-0400)   Predominant rhythm:   SInus rhythm   Rate 48 to 128 bpm  Average HR 74 bpm     Short burst of SVT longest 18 beats at 117 bpm; fastest 4 beats at 184 bpm    Rare PVCs, PACs    No triggered events    CTA Coronary 09/2019: FINDINGS: A 100 kV prospective scan was triggered in the descending thoracic aorta at 111 HU's. Axial non-contrast 3 mm slices were carried out through the heart. The data set was analyzed on a dedicated work station and scored using the [place]. Gantry rotation speed was 250 msecs and collimation was .6 mm. 50 mg of PO Metoprolol and 0.8 mg of sl NTG was given. The 3D data set was reconstructed in 5% intervals of the 67-82 % of the R-R cycle. Diastolic phases were analyzed on a dedicated work station using MPR, MIP and VRT modes. The patient received 80 cc of contrast.   Aorta: Normal size. Mild diffuse atherosclerotic plaque. No dissection.   Aortic Valve:  Trileaflet.  Trivial calcifications.   Coronary Arteries:  Normal coronary origin.  Left dominance.   RCA is a non dominant and diminutive.   Left main is a large artery that gives rise to LAD and LCX arteries. Left main has no plaque.   LAD is a large vessel that wraps around the apex and has no plaque. Distal LAD partially supplies PDA territory.   LCX is a large dominant artery that gives rise to two large OM branches and PLA. There is no plaque.   Other findings:   Normal pulmonary vein drainage into the left atrium.   Normal left atrial appendage without a thrombus.   Moderately dilated pulmonary artery measuring 38 mm suggestive of pulmonary hypertension.   IMPRESSION: 1. Coronary calcium score of 0. This was 0 percentile for age and sex matched control.   2. Normal coronary origin with left dominance.   3. CAD-RADS 0. No evidence of CAD (0%). Consider non-atherosclerotic causes of chest pain.   4. Moderately dilated pulmonary artery measuring 38 mm suggestive of pulmonary hypertension.  EKG:  EKG is personally reviewed. 12/11/2021:  Sinus rhythm. Rate 67  bpm.  Recent Labs: 08/04/2021: ALT 11; Hemoglobin 13.6; Platelets 153; TSH 1.59 12/05/2021: BUN 23; Creat 0.89; Magnesium 2.2; Potassium 4.0; Sodium 139   Recent Lipid Panel    Component Value Date/Time   CHOL 149 08/04/2021 1533   CHOL 139 09/26/2015 0837   TRIG 87 08/04/2021 1533   HDL 73 08/04/2021 1533   HDL 57 09/26/2015 0837   CHOLHDL 2.0 08/04/2021 1533   VLDL 29 07/03/2016 0825   LDLCALC 59 08/04/2021 1533     Risk Assessment/Calculations:                Physical Exam:    VS:  BP 118/66   Pulse 67   Ht _0  (1.676 m)   Wt 148 lb (67.1 kg)   BMI 23.89 kg/m     Wt Readings from Last 3 Encounters:  12/11/21 148 lb (67.1 kg)  12/05/21 150 lb (68 kg)  08/10/21 150 lb 3.2 oz (68.1 kg)     GEN: Well nourished, well developed in no acute distress HEENT: Normal NECK: No JVD; No carotid bruits CARDIAC: RRR, no murmurs, rubs, gallops RESPIRATORY:  Clear to auscultation without rales, wheezing or rhonchi  ABDOMEN: Soft, non-tender, non-distended MUSCULOSKELETAL:  No  edema; No deformity  SKIN: Warm and dry NEUROLOGIC:  Alert and oriented x 3 PSYCHIATRIC:  Normal affect   ASSESSMENT:    1. Palpitations   2. Essential hypertension   3. Mixed hyperlipidemia   4. Precordial pain   5. Shortness of breath    PLAN:    In order of problems listed above:  #Palpitations: Improved. Cardiac monitor with no significant arrhythmias. Rare ectopy. -Continue metop 12.104m BID  #Dyspnea on Exertion: #Chest Pain: Reassuring work-up with coronary CTA in 2021 with no disease, Ca score 0. TTE with LVEF 60-65%, no significant valve disease.  #HLD: -Continue crestor 517mdaily -LDL 59 on 07/2021  #HTN: Well controlled and at goal. -Continue metop 12.47m38mD -Continue amlodipine 47mg27mily         Follow-up:  1 year.  Medication Adjustments/Labs and Tests Ordered: Current medicines are reviewed at length with the patient today.  Concerns regarding medicines are  outlined above.   Orders Placed This Encounter  Procedures   EKG 12-Lead   No orders of the defined types were placed in this encounter.  Patient Instructions  Medication Instructions:   Your physician recommends that you continue on your current medications as directed. Please refer to the Current Medication list given to you today.  *If you need a refill on your cardiac medications before your next appointment, please call your pharmacy*   Follow-Up: [facility] and your health needs are our priority.  As part of our continuing mission to provide you with exceptional heart care, we have created designated Provider Care Teams.  These Care Teams include your primary Cardiologist (physician) and Advanced Practice Providers (APPs -  Physician Assistants and Nurse Practitioners) who all work together to provide you with the care you need, when you need it.  We recommend signing up for the patient portal called "MyChart".  Sign up information is provided on this After Visit Summary.  MyChart is used to connect with patients for Virtual Visits (Telemedicine).  Patients are able to view lab/test results, encounter notes, upcoming appointments, etc.  Non-urgent messages can be sent to your provider as well.   To learn more about what you can do with MyChart, go to [email or web address] Your next appointment:   1 year(s)  The format for your next appointment:   In Person  Provider:   HeatFreada Bergeron      Important Information About Sugar         I,Mathew Stumpf,acting as a scribe for HeatFreada Bergeron.,have documented all relevant documentation on the behalf of HeatFreada Bergeron,as directed by  HeatFreada Bergeron while in the presence of HeatFreada Bergeron.  I, HeatFreada Bergeron, have reviewed all documentation for this visit. The documentation on 12/11/21 for the exam, diagnosis, procedures, and orders are all accurate and  complete.   Signed, HeatFreada Bergeron  12/11/2021 9:43 AM    [facility]

## 2021-12-19 ENCOUNTER — Telehealth: Payer: Self-pay | Admitting: Family

## 2021-12-19 NOTE — Telephone Encounter (Signed)
Pt called to report she has not heard from Dr. Rip Harbour at [place] to schedule. Prior note 12/07/21 provider stated will have referral sent to [facility] with [facility].

## 2021-12-22 NOTE — Telephone Encounter (Signed)
Patient is going to call [facility] to schedule an appointment.  Referral has been sent.

## 2021-12-27 ENCOUNTER — Ambulatory Visit (INDEPENDENT_AMBULATORY_CARE_PROVIDER_SITE_OTHER): Payer: Medicare HMO

## 2021-12-27 ENCOUNTER — Ambulatory Visit: Payer: Medicare HMO | Admitting: Podiatry

## 2021-12-27 DIAGNOSIS — M7751 Other enthesopathy of right foot: Secondary | ICD-10-CM

## 2021-12-27 DIAGNOSIS — R52 Pain, unspecified: Secondary | ICD-10-CM

## 2021-12-27 MED ORDER — BETAMETHASONE SOD PHOS & ACET 6 (3-3) MG/ML IJ SUSP
3.0000 mg | Freq: Once | INTRAMUSCULAR | Status: AC
  Administered 2021-12-27: 3 mg via INTRA_ARTICULAR

## 2021-12-27 NOTE — Progress Notes (Signed)
   Chief Complaint  Patient presents with   ankle pian    Patient is here right for ankle pain.    Subjective:  76 y.o. female presenting today for new complaint of pain and tenderness to the right ankle joint.  This is been ongoing for few weeks now.  She denies a history of injury.  She wears good supportive shoes the majority of the time.  She has not done anything for treatment.   Past Medical History:  Diagnosis Date   Abnormal Pap smear 05/08/2011   ASC-US +HPV   Arthritis    Asthma    Constipation    Diabetes mellitus type 2 with neurological manifestations (HCC)    Diabetic peripheral neuropathy (HCC)    Generalized osteoarthritis    GERD (gastroesophageal reflux disease)    Hx of seasonal allergies    Hyperlipidemia    Hypertension    IBS (irritable bowel syndrome)    S/P total hysterectomy 06/21/2011    Past Surgical History:  Procedure Laterality Date   CATARACT EXTRACTION Left    EYE SURGERY Right 01/2018   Removed raised area on eye. Dr.Groat    OVARIAN CYST SURGERY  1986   PELVIC LAPAROSCOPY     TONSILECTOMY, ADENOIDECTOMY, BILATERAL MYRINGOTOMY AND TUBES  1974   VAGINAL HYSTERECTOMY  1983    Allergies  Allergen Reactions   Aspirin    Pravastatin Sodium Other (See Comments)    Muscle aches   Restasis [Cyclosporine] Other (See Comments)   Lisinopril Other (See Comments)    Blood pressures low and causes constipation    Objective / Physical Exam:  General:  The patient is alert and oriented x3 in no acute distress. Dermatology:  Skin is warm, dry and supple bilateral lower extremities. Negative for open lesions or macerations. Vascular:  Palpable pedal pulses bilaterally. No edema or erythema noted. Capillary refill within normal limits. Neurological:  Epicritic and protective threshold grossly intact bilaterally.  Musculoskeletal Exam:  Pain on palpation to the anterior lateral medial aspects of the patient's right ankle. Mild edema noted. Range  of motion within normal limits to all pedal and ankle joints bilateral. Muscle strength 5/5 in all groups bilateral.   Radiographic Exam RT ankle 12/27/2021:  Normal osseous mineralization. Joint spaces preserved. No fracture/dislocation/boney destruction.    Assessment: 1.  Capsulitis right ankle  Plan of Care:  1. Patient was evaluated. X-Rays reviewed.  2. Injection of 0.5 mL Celestone Soluspan injected in the patient's right ankle medial aspect. 3.  Recommend good supportive shoes and sneakers 4.  Return to clinic as needed    Edrick Kins, DPM [facility]  Dr. Edrick Kins, DPM    [address], [place] [address]                Office [phone]  Fax [phone]

## 2021-12-29 LAB — GLUCOSE, POCT (MANUAL RESULT ENTRY): POC Glucose: 100 mg/dl — AB (ref 70–99)

## 2021-12-29 LAB — POCT GLYCOSYLATED HEMOGLOBIN (HGB A1C): Hemoglobin-A1c: 6.5

## 2022-01-01 ENCOUNTER — Ambulatory Visit: Payer: Medicare HMO | Admitting: Podiatry

## 2022-01-01 ENCOUNTER — Encounter: Payer: Self-pay | Admitting: Podiatry

## 2022-01-01 DIAGNOSIS — E1149 Type 2 diabetes mellitus with other diabetic neurological complication: Secondary | ICD-10-CM

## 2022-01-01 DIAGNOSIS — I872 Venous insufficiency (chronic) (peripheral): Secondary | ICD-10-CM

## 2022-01-01 DIAGNOSIS — M79609 Pain in unspecified limb: Secondary | ICD-10-CM | POA: Diagnosis not present

## 2022-01-01 DIAGNOSIS — B351 Tinea unguium: Secondary | ICD-10-CM | POA: Diagnosis not present

## 2022-01-01 DIAGNOSIS — R6889 Other general symptoms and signs: Secondary | ICD-10-CM | POA: Diagnosis not present

## 2022-01-01 DIAGNOSIS — E119 Type 2 diabetes mellitus without complications: Secondary | ICD-10-CM

## 2022-01-04 ENCOUNTER — Encounter: Payer: Medicare HMO | Admitting: Family

## 2022-01-06 NOTE — Progress Notes (Signed)
ANNUAL DIABETIC FOOT EXAM  Subjective: Sierra Stanly V. presents today for annual diabetic foot examination.  Patient confirms h/o diabetes.  Patient relates 31 year h/o diabetes.  Patient denies any h/o foot wounds.  She does have h/o varicose veins of both lower extremities. States she will be purchasing compression hose for management of this condition.  Patient has been diagnosed with neuropathy.  Patient's blood sugar was 117 mg/dl today. Last known  HgA1c was 6.5%   Risk factors: diabetes, diabetic neuropathy, HTN, hyperlipidemia.  Sierra Ray, Sierra Bucks, NP is patient's PCP. Last visit was August 03, 2021.  Past Medical History:  Diagnosis Date   Abnormal Pap smear 05/08/2011   ASC-US +HPV   Arthritis    Asthma    Constipation    Diabetes mellitus type 2 with neurological manifestations (Sierra Ray)    Diabetic peripheral neuropathy (HCC)    Generalized osteoarthritis    GERD (gastroesophageal reflux disease)    Hx of seasonal allergies    Hyperlipidemia    Hypertension    IBS (irritable bowel syndrome)    S/P total hysterectomy 06/21/2011   Patient Active Problem List   Diagnosis Date Noted   Intussusception of small bowel (Sierra Ray) 11/21/2021   Abdominal pain 08/21/2021   Adaptive colitis 08/21/2021   Allergic rhinitis 08/21/2021   Anxiety state 08/21/2021   Cramp of limb 08/21/2021   Diabetes mellitus with polyneuropathy (Sierra Ray) 08/21/2021   Dysphonia 08/21/2021   Generalized OA 08/21/2021   LBP (low back pain) 08/21/2021   Primary generalized (osteo)arthritis 08/21/2021   Type II diabetes mellitus with neurological manifestations (Sierra Ray) 08/21/2021   Hypothyroidism (acquired) 08/10/2021   Claudication (Sierra Ray) 08/04/2020   Vitreous floaters, left 04/19/2020   Posterior vitreous detachment, left eye 04/19/2020   Vitreomacular traction, left 03/22/2020   Posterior vitreous detachment of right eye 03/22/2020   Nuclear sclerotic cataract of right eye 03/22/2020   Chronic  pain of both knees 08/12/2019   Unsteady gait 08/12/2019   Slow transit constipation 08/12/2019   High risk medication use 07/24/2017   Depression 01/23/2017   Costochondritis 07/25/2016   Varicose veins of both lower extremities with complications 62/83/6629   Short-term memory loss 02/03/2016   Pain in joint, multiple sites 02/03/2016   Chronic seasonal allergic rhinitis due to pollen 02/03/2016   Gastroesophageal reflux disease without esophagitis 07/03/2015   Type 2 diabetes mellitus with diabetic polyneuropathy, without long-term current use of insulin (Sierra Ray) 07/03/2015   Hyperlipidemia LDL goal <100 07/03/2015   Benign essential HTN 07/03/2015   Encounter for gynecological examination with abnormal finding 10/01/2014   Mastodynia 10/01/2014   Carpal tunnel syndrome 05/20/2014   Essential hypertension 01/15/2014   Palpitations 01/15/2014   Hyperlipidemia 01/15/2014   DOE (dyspnea on exertion) 01/15/2014   PAC (premature atrial contraction) 01/15/2014   S/P total hysterectomy 06/21/2011   Past Surgical History:  Procedure Laterality Date   CATARACT EXTRACTION Left    EYE SURGERY Right 01/2018   Removed raised area on eye. Dr.Groat    OVARIAN CYST SURGERY  1986   PELVIC LAPAROSCOPY     TONSILECTOMY, ADENOIDECTOMY, BILATERAL MYRINGOTOMY AND TUBES  1974   VAGINAL HYSTERECTOMY  1983   Current Outpatient Medications on File Prior to Visit  Medication Sig Dispense Refill   Accu-Chek Softclix Lancets lancets Use to test blood sugar one time daily for diabetes. Dx:E11.42 300 each 1   acetaminophen (TYLENOL) 500 MG tablet Take 1 tablet (500 mg total) by mouth every 8 (eight) hours as needed for  moderate pain. 30 tablet 0   Alcohol Swabs (B-D SINGLE USE SWABS REGULAR) PADS Use to test blood sugar twice daily. Dx: E11.42 200 each 3   amLODipine (NORVASC) 5 MG tablet TAKE 1/2 TABLET EVERY DAY 45 tablet 3   b complex vitamins capsule Take 1 capsule by mouth daily.     Blood Glucose  Monitoring Suppl (ACCU-CHEK AVIVA PLUS) w/Device KIT Use to test blood sugar twice daily. Dx E11.42 1 kit 0   cholecalciferol (VITAMIN D) 1000 UNITS tablet Take 1,000 Units by mouth daily.     fluticasone (FLONASE) 50 MCG/ACT nasal spray Place 2 sprays into both nostrils as needed.      glucose blood (ACCU-CHEK AVIVA PLUS) test strip Use to test blood sugar twice daily. Dx:E11.42 200 strip 3   Lancets Misc. (ACCU-CHEK SOFTCLIX LANCET DEV) KIT Use to test blood sugar. Dx: E11.42 1 kit 0   latanoprost (XALATAN) 0.005 % ophthalmic solution 1 drop at bedtime.     loratadine (CLARITIN) 10 MG tablet Take 10 mg by mouth daily as needed.     metFORMIN (GLUCOPHAGE) 500 MG tablet TAKE 2 TABLETS EVERY DAY WITH BREAKFAST 180 tablet 1   metoprolol tartrate (LOPRESSOR) 25 MG tablet Take 0.5 tablets (12.5 mg total) by mouth 2 (two) times daily as needed (palpitations). 90 tablet 2   Multiple Vitamins-Minerals (MULTIVITAMIN WITH MINERALS) tablet Take 1 tablet by mouth daily.     multivitamin-lutein (OCUVITE-LUTEIN) CAPS capsule Take 1 capsule by mouth daily.     polyethylene glycol (MIRALAX / GLYCOLAX) packet Take 17 g by mouth daily as needed. For constipation     Probiotic Product (ALIGN) 4 MG CAPS Take 1 capsule by mouth daily.      Propylene Glycol (SYSTANE COMPLETE OP) Place 1 drop into both eyes in the morning, at noon, in the evening, and at bedtime.     rosuvastatin (CRESTOR) 5 MG tablet TAKE 1 TABLET EVERY DAY 90 tablet 1   No current facility-administered medications on file prior to visit.    Allergies  Allergen Reactions   Aspirin    Pravastatin Sodium Other (See Comments)    Muscle aches   Restasis [Cyclosporine] Other (See Comments)   Lisinopril Other (See Comments)    Blood pressures low and causes constipation   Social History   Occupational History    Employer: RETIRED  Tobacco Use   Smoking status: Never   Smokeless tobacco: Never  Vaping Use   Vaping Use: Never used  Substance  and Sexual Activity   Alcohol use: No   Drug use: No   Sexual activity: Not Currently    Birth control/protection: Surgical    Comment: 1st intercourse- 20, partners- 61,    Family History  Problem Relation Age of Onset   Diabetes Mother    Cancer Mother        colon    Diabetes Father    Heart disease Father    Pancreatic cancer Sister    Breast cancer Maternal Aunt    Breast cancer Paternal Aunt    Cancer Paternal Grandmother        liver    Breast cancer Cousin    Breast cancer Cousin    Pancreatic cancer Brother    Immunization History  Administered Date(s) Administered   Fluad Quad(high Dose 65+) 02/11/2019, 02/12/2020, 01/27/2021   Influenza, High Dose Seasonal PF 01/28/2018   Influenza,inj,Quad PF,6+ Mos 02/03/2016, 01/23/2017   Moderna Sars-Covid-2 Vaccination 06/22/2019, 07/24/2019, 03/24/2020   PFIZER(Purple Top)SARS-COV-2  Vaccination 04/26/2021   Pneumococcal Conjugate-13 07/30/2014   Pneumococcal Polysaccharide-23 11/17/2005, 05/20/2012   Td 08/18/2010, 02/16/2021   Tdap 02/16/2021     Review of Systems: Negative except as noted in the HPI.   Objective: There were no vitals filed for this visit.  Sierra Krist V. is a pleasant 76 y.o. female in NAD. AAO X 3.  Vascular Examination: Capillary refill time to digits immediate b/l. Palpable DP pulse(s) b/l LE. Palpable PT pulse(s) b/l LE. Pedal hair sparse. No pain with calf compression b/l. Lower extremity skin temperature gradient within normal limits. Varicosities present b/l.  Dermatological Examination: Pedal skin is warm and supple b/l LE. No open wounds b/l LE. No interdigital macerations noted b/l LE. Toenails 1-5 bilaterally elongated, discolored, dystrophic, thickened, and crumbly with subungual debris and tenderness to dorsal palpation. Evidence of chronic venous insufficiency b/l lower extremities.  Neurological Examination: Pt has subjective symptoms of neuropathy. Protective sensation intact  5/5 intact bilaterally with 10g monofilament b/l. Vibratory sensation intact b/l.  Musculoskeletal Examination: Muscle strength 5/5 to all lower extremity muscle groups bilaterally. No pain, crepitus or joint limitation noted with ROM bilateral LE. No gross bony deformities bilaterally. Utilizes cane for ambulation assistance.  Footwear Assessment: Does the patient wear appropriate shoes? Yes. Does the patient need inserts/orthotics? No.  ADA Risk Categorization: Low Risk :  Patient has all of the following: Intact protective sensation No prior foot ulcer  No severe deformity Pedal pulses present  Assessment: 1. Pain due to onychomycosis of nail   2. Chronic venous insufficiency   3. Type II diabetes mellitus with neurological manifestations ([place])   4. Encounter for diabetic foot exam [facility])     Plan: -Patient was evaluated and treated. All patient's and/or POA's questions/concerns answered on today's visit. -Diabetic foot examination performed today. -Stressed the importance of good glycemic control and the detriment of not  controlling glucose levels in relation to the foot. -Continue foot and shoe inspections daily. Monitor blood glucose per PCP/Endocrinologist's recommendations. -Patient to continue soft, supportive shoe gear daily. -Toenails 1-5 b/l were debrided in length and girth with sterile nail nippers and dremel without iatrogenic bleeding.  -Patient/POA to call should there be question/concern in the interim. Return in about 3 months (around 04/02/2022).  Marzetta Board, DPM

## 2022-01-08 ENCOUNTER — Telehealth: Payer: Self-pay

## 2022-01-08 ENCOUNTER — Ambulatory Visit (INDEPENDENT_AMBULATORY_CARE_PROVIDER_SITE_OTHER): Payer: Medicare HMO | Admitting: Family

## 2022-01-08 ENCOUNTER — Encounter: Payer: Self-pay | Admitting: Family

## 2022-01-08 VITALS — Ht 66.0 in | Wt 150.0 lb

## 2022-01-08 DIAGNOSIS — Z0000 Encounter for general adult medical examination without abnormal findings: Secondary | ICD-10-CM

## 2022-01-08 NOTE — Progress Notes (Signed)
This service is provided via telemedicine  No vital signs collected/recorded due to the encounter was a telemedicine visit.   Location of patient (ex: home, work):  home  Patient consents to a telephone visit:  yes  Location of the provider (ex: office, home):  [facility]  Names of all persons participating in the telemedicine service and their role in the encounter:  Sierra Ray, patient, Marlowe Sax, NP  Time spent on call:  10 minutes     Subjective:   Sierra Colclough V. is a 76 y.o. female who presents for Medicare Annual (Subsequent) preventive examination.  Review of Systems    Cardiac Risk Factors include: advanced age (>58mn, >>30women);hypertension;diabetes mellitus     Objective:    Today's Vitals   01/08/22 0847 01/08/22 0922  Weight: 150 lb (68 kg)   Height: _0  (1.676 m)   PainSc:  7    Body mass index is 24.21 kg/m.     01/08/2022    8:42 AM 08/03/2021   10:18 AM 06/19/2021    3:27 PM 05/05/2021   10:35 AM 02/03/2021   12:56 PM 12/27/2020    1:19 PM 12/06/2020    8:38 AM  Advanced Directives  Does Patient Have a Medical Advance Directive? _1  Yes Yes  Type of Advance Directive Healthcare Power of Attorney _2  Living will  Does patient want to make changes to medical advance directive? No - Patient declined No - Patient declined No - Patient declined No - Patient declined No - Patient declined No - Patient declined No - Patient declined  Copy of HPosenin Chart? Yes - validated most recent copy scanned in chart (See row information)          Current Medications (verified) Outpatient Encounter Medications as of 01/08/2022  Medication Sig   Accu-Chek Softclix Lancets lancets Use to test blood sugar one time daily for diabetes. Dx:E11.42   acetaminophen (TYLENOL) 500 MG tablet Take 1 tablet (500 mg total) by mouth  every 8 (eight) hours as needed for moderate pain.   Alcohol Swabs (B-D SINGLE USE SWABS REGULAR) PADS Use to test blood sugar twice daily. Dx: E11.42   amLODipine (NORVASC) 5 MG tablet TAKE 1/2 TABLET EVERY DAY   b complex vitamins capsule Take 1 capsule by mouth daily.   Blood Glucose Monitoring Suppl (ACCU-CHEK AVIVA PLUS) w/Device KIT Use to test blood sugar twice daily. Dx E11.42   cholecalciferol (VITAMIN D) 1000 UNITS tablet Take 1,000 Units by mouth daily.   fluticasone (FLONASE) 50 MCG/ACT nasal spray Place 2 sprays into both nostrils as needed.    glucose blood (ACCU-CHEK AVIVA PLUS) test strip Use to test blood sugar twice daily. Dx:E11.42   Lancets Misc. (ACCU-CHEK SOFTCLIX LANCET DEV) KIT Use to test blood sugar. Dx: E11.42   latanoprost (XALATAN) 0.005 % ophthalmic solution 1 drop at bedtime.   loratadine (CLARITIN) 10 MG tablet Take 10 mg by mouth daily as needed.   metFORMIN (GLUCOPHAGE) 500 MG tablet TAKE 2 TABLETS EVERY DAY WITH BREAKFAST   metoprolol tartrate (LOPRESSOR) 25 MG tablet Take 0.5 tablets (12.5 mg total) by mouth 2 (two) times daily as needed (palpitations).   Multiple Vitamins-Minerals (MULTIVITAMIN WITH MINERALS) tablet Take 1 tablet by mouth daily.   multivitamin-lutein (OCUVITE-LUTEIN) CAPS capsule Take 1 capsule by mouth daily.   polyethylene glycol (MIRALAX / GLYCOLAX) packet Take 17  g by mouth daily as needed. For constipation   Probiotic Product (ALIGN) 4 MG CAPS Take 1 capsule by mouth daily.    Propylene Glycol (SYSTANE COMPLETE OP) Place 1 drop into both eyes in the morning, at noon, in the evening, and at bedtime.   rosuvastatin (CRESTOR) 5 MG tablet TAKE 1 TABLET EVERY DAY   No facility-administered encounter medications on file as of 01/08/2022.    Allergies (verified) Aspirin, Pravastatin sodium, Restasis [cyclosporine], and Lisinopril   History: Past Medical History:  Diagnosis Date   Abnormal Pap smear 05/08/2011   ASC-US +HPV   Arthritis     Asthma    Constipation    Diabetes mellitus type 2 with neurological manifestations (HCC)    Diabetic peripheral neuropathy (HCC)    Generalized osteoarthritis    GERD (gastroesophageal reflux disease)    Hx of seasonal allergies    Hyperlipidemia    Hypertension    IBS (irritable bowel syndrome)    S/P total hysterectomy 06/21/2011   Past Surgical History:  Procedure Laterality Date   CATARACT EXTRACTION Left    EYE SURGERY Right 01/2018   Removed raised area on eye. Dr.Groat    OVARIAN CYST SURGERY  1986   PELVIC LAPAROSCOPY     TONSILECTOMY, ADENOIDECTOMY, BILATERAL MYRINGOTOMY AND TUBES  1974   VAGINAL HYSTERECTOMY  1983   Family History  Problem Relation Age of Onset   Diabetes Mother    Cancer Mother        colon    Diabetes Father    Heart disease Father    Pancreatic cancer Sister    Breast cancer Maternal Aunt    Breast cancer Paternal Aunt    Cancer Paternal Grandmother        liver    Breast cancer Cousin    Breast cancer Cousin    Pancreatic cancer Brother    Social History   Socioeconomic History   Marital status: Divorced    Spouse name: Not on file   Number of children: 4   Years of education: Not on file   Highest education level: Not on file  Occupational History    Employer: RETIRED  Tobacco Use   Smoking status: Never   Smokeless tobacco: Never  Vaping Use   Vaping Use: Never used  Substance and Sexual Activity   Alcohol use: No   Drug use: No   Sexual activity: Not Currently    Birth control/protection: Surgical    Comment: 1st intercourse- 20, partners- 72,   Other Topics Concern   Not on file  Social History Narrative   Diet?    Low fat, low carb, low salt      Do you drink/eat things with caffeine?      Marital status?  single                                  What year were you married?      Do you live in a house, apartment, assisted living, condo, trailer, etc.?      Is it one or more stories?      How many persons live  in your home?      Do you have any pets in your home? (please list)      Current or past profession:      Do you exercise?   yes  Type & how often? Yoga, water aerobics 1-2x/week      Do you have a living will?      Do you have a DNR form?                                  If not, do you want to discuss one?      Do you have signed POA/HPOA for forms?       Social Determinants of Health   Financial Resource Strain: Low Risk  (07/24/2017)   Overall Financial Resource Strain (CARDIA)    Difficulty of Paying Living Expenses: Not hard at all  Food Insecurity: No Food Insecurity (07/24/2017)   Hunger Vital Sign    Worried About Running Out of Food in the Last Year: Never true    Ran Out of Food in the Last Year: Never true  Transportation Needs: No Transportation Needs (07/24/2017)   PRAPARE - Hydrologist (Medical): No    Lack of Transportation (Non-Medical): No  Physical Activity: Sufficiently Active (07/24/2017)   Exercise Vital Sign    Days of Exercise per Week: 3 days    Minutes of Exercise per Session: 50 min  Stress: No Stress Concern Present (07/24/2017)   [employer]    Feeling of Stress : Not at all  Social Connections: Somewhat Isolated (07/24/2017)   Social Connection and Isolation Panel [NHANES]    Frequency of Communication with Friends and Family: More than three times a week    Frequency of Social Gatherings with Friends and Family: More than three times a week    Attends Religious Services: More than 4 times per year    Active Member of [employer] or Organizations: No    Attends Music therapist: Never    Marital Status: Never married    Tobacco Counseling Counseling given: Not Answered   Clinical Intake:  Pre-visit preparation completed: No  Pain : 0-10 Pain Score: 7  Pain Type: Chronic pain Pain Location: Knee (shoulder,neck) Pain  Orientation: Right Pain Radiating Towards: No Pain Descriptors / Indicators: Aching, Stabbing, Throbbing Pain Onset: More than a month ago Pain Frequency: Intermittent Pain Relieving Factors: OTC topical Gel Effect of Pain on Daily Activities: yes  Pain Relieving Factors: OTC topical Gel  BMI - recorded: 24.21  How often do you need to have someone help you when you read instructions, pamphlets, or other written materials from your doctor or pharmacy?: 1 - Never (uses magnifying glasses) What is the last grade level you completed in school?: 2 yrs of college  Diabetic?Yes   Interpreter Needed?: No      Activities of Daily Living    01/08/2022    9:31 AM  In your present state of health, do you have any difficulty performing the following activities:  Hearing? 0  Vision? 1  Comment double vision  Difficulty concentrating or making decisions? 1  Comment Remembering and making decisions  Walking or climbing stairs? 0  Dressing or bathing? 1  Doing errands, shopping? 1  Comment [place] and eating ? Y  Comment Neighbor assist  Using the Toilet? N  Comment miralax effective  In the past six months, have you accidently leaked urine? Y  Do you have problems with loss of bowel control? N  Managing your Medications? N  Managing your Finances? N  Housekeeping or managing your Housekeeping? Y  Comment family assist    Patient Care Team: [name], [name], NP as PCP - General (Family Medicine) Freada Bergeron, MD as PCP - Cardiology (Cardiology) Dorothy Spark, MD as Consulting Physician (Cardiology) Clent Jacks, MD as Consulting Physician (Ophthalmology) Carol Ada, MD as Consulting Physician (Gastroenterology) Michael Boston, MD as Consulting Physician (General Surgery)  Indicate any recent Medical Services you may have received from other than [facility] providers in the past year (date may be approximate).     Assessment:   This is a  routine wellness examination for Breannah.  Hearing/Vision screen Hearing Screening   125Hz  Right ear Pass  Left ear Pass    Dietary issues and exercise activities discussed: Current Exercise Habits: Home exercise routine, Type of exercise: walking, Time (Minutes): 60, Frequency (Times/Week): 3, Weekly Exercise (Minutes/Week): 180, Intensity: Mild   Goals Addressed             This Visit's Progress    Exercise 3x per week (30 min per time)   On track    Starting 07/03/16 I will start doing exercise three days a week.I would like to walk a total of 30 min three times a week and eventually get to aquatics.     Patient Stated   On track    Patient will like to get back into aquatics with her friends     Patient Stated   Not on track    Be able sleep 6-8 hrs        Depression Screen    01/08/2022    9:30 AM 01/08/2022    8:40 AM 08/10/2021    1:46 PM 12/27/2020    1:15 PM 12/25/2019    8:15 AM 02/11/2019   11:25 AM 07/24/2017    9:10 AM  PHQ 2/9 Scores  PHQ - 2 Score 0 0 0 0 0 0 0  PHQ- 9 Score  0         Fall Risk    01/08/2022    8:41 AM 08/03/2021   10:17 AM 06/19/2021    3:26 PM 05/05/2021   10:35 AM 02/03/2021   12:56 PM  [place] in the past year? 1 0 1 0 0  Number falls in past yr: 0 0 0 0 0  Injury with Fall? 0 0 1 0 0  Risk for fall due to : No Fall Risks No Fall Risks Impaired balance/gait No Fall Risks No Fall Risks  Follow up Falls evaluation completed Falls evaluation completed Falls evaluation completed;Education provided;Falls prevention discussed Falls evaluation completed Falls evaluation completed    FALL RISK PREVENTION PERTAINING TO THE HOME:  Any stairs in or around the home? Yes  If so, are there any without handrails? No  Home free of loose throw rugs in walkways, pet beds, electrical cords, etc? No  Adequate lighting in your home to reduce risk of falls? Yes   ASSISTIVE DEVICES UTILIZED TO PREVENT FALLS:  Life alert? No  Use of  a cane, walker or w/c? Yes  Grab bars in the bathroom? Yes  Shower chair or bench in shower? Yes  Elevated toilet seat or a handicapped toilet? Yes   TIMED UP AND GO:  Was the test performed? No .  Length of time to ambulate 10 feet: N/A  sec.   Gait slow and steady with assistive device  Cognitive Function:    07/24/2017    8:41 AM 07/03/2016  8:57 AM 07/01/2015    3:13 PM  MMSE - Mini Mental State Exam  Orientation to time _0 Orientation to Place _1 Registration _2 Attention/ Calculation _3 Recall _4 Language- name 2 objects _5 Language- repeat _6 Language- follow 3 step command _7 Language- read & follow direction _8 Write a sentence _9 Copy design _10 Total score _11 01/08/2022    8:43 AM 12/27/2020    1:17 PM 12/25/2019    8:16 AM  6CIT Screen  What Year? 0 points 0 points 0 points  What month? 0 points 0 points 0 points  What time? 0 points 0 points 0 points  Count back from 20 0 points 0 points 0 points  Months in reverse 0 points 0 points 0 points  Repeat phrase 2 points 4 points 0 points  Total Score 2 points 4 points 0 points    Immunizations Immunization History  Administered Date(s) Administered   Fluad Quad(high Dose 65+) 02/11/2019, 02/12/2020, 01/27/2021   Influenza, High Dose Seasonal PF 01/28/2018   Influenza,inj,Quad PF,6+ Mos 02/03/2016, 01/23/2017   Moderna Sars-Covid-2 Vaccination 06/22/2019, 07/24/2019, 03/24/2020   PFIZER(Purple Top)SARS-COV-2 Vaccination 04/26/2021   Pneumococcal Conjugate-13 07/30/2014   Pneumococcal Polysaccharide-23 11/17/2005, 05/20/2012   Td 08/18/2010, 02/16/2021   Tdap 02/16/2021    TDAP status: Up to date  Flu Vaccine status: Due, Education has been provided regarding the importance of this vaccine. Advised may receive this vaccine at local pharmacy or Health Dept. Aware to provide a copy of the vaccination record if obtained from local pharmacy or Health  Dept. Verbalized acceptance and understanding.  Pneumococcal vaccine status: Up to date  Covid-19 vaccine status: Information provided on how to obtain vaccines.   Qualifies for Shingles Vaccine? Yes   Zostavax completed No   Shingrix Completed?: No.    Education has been provided regarding the importance of this vaccine. Patient has been advised to call insurance company to determine out of pocket expense if they have not yet received this vaccine. Advised may also receive vaccine at local pharmacy or Health Dept. Verbalized acceptance and understanding.  Screening Tests Health Maintenance  Topic Date Due   COVID-19 Vaccine (5 - Moderna risk series) 06/21/2021   Diabetic kidney evaluation - Urine ACR  08/04/2021   INFLUENZA VACCINE  11/14/2021   OPHTHALMOLOGY EXAM  01/11/2022   HEMOGLOBIN A1C  06/29/2022   Diabetic kidney evaluation - GFR measurement  12/06/2022   FOOT EXAM  01/02/2023   MAMMOGRAM  07/26/2023   COLONOSCOPY (Pts 45-82yr Insurance coverage will need to be confirmed)  09/05/2025   TETANUS/TDAP  02/17/2031   Pneumonia Vaccine 76 Years old  Completed   DEXA SCAN  Completed   Hepatitis C Screening  Completed   HPV VACCINES  Aged Out   Zoster Vaccines- Shingrix  Discontinued    Health Maintenance  Health Maintenance Due  Topic Date Due   COVID-19 Vaccine (5 - Moderna risk series) 06/21/2021   Diabetic kidney evaluation - Urine ACR  08/04/2021   INFLUENZA VACCINE  11/14/2021    Colorectal cancer screening: Type of screening: Colonoscopy. Completed 2022. Repeat every 5 years  Mammogram status: Completed 07/25/2021 . Repeat every year  Bone Density status: Completed 08/23/2020. Results reflect: Bone density results: OSTEOPENIA. Repeat every  5 years.  Lung Cancer Screening: (Low Dose CT Chest recommended if Age 54-80 years, 30 pack-year currently smoking OR have quit w/in 15years.) does not qualify.   Lung Cancer Screening Referral: No   Additional  Screening:  Hepatitis C Screening: does qualify; Completed yes   Vision Screening: Recommended annual ophthalmology exams for early detection of glaucoma and other disorders of the eye. Is the patient up to date with their annual eye exam?  Yes  Who is the provider or what is the name of the office in which the patient attends annual eye exams? Dr.Groat  If pt is not established with a provider, would they like to be referred to a provider to establish care? No .   Dental Screening: Recommended annual dental exams for proper oral hygiene  Community Resource Referral / Chronic Care Management: CRR required this visit?  No   CCM required this visit?  No      Plan:     I have personally reviewed and noted the following in the patient's chart:   Medical and social history Use of alcohol, tobacco or illicit drugs  Current medications and supplements including opioid prescriptions. Patient is not currently taking opioid prescriptions. Functional ability and status Nutritional status Physical activity Advanced directives List of other physicians Hospitalizations, surgeries, and ER visits in previous 12 months Vitals Screenings to include cognitive, depression, and falls Referrals and appointments  In addition, I have reviewed and discussed with patient certain preventive protocols, quality metrics, and best practice recommendations. A written personalized care plan for preventive services as well as general preventive health recommendations were provided to patient.     [name], NP   01/08/2022   Nurse Notes: Advised to get Influenza ,COVID-19 booster vaccine

## 2022-01-08 NOTE — Patient Instructions (Signed)
Ms. Alas , Thank you for taking time to come for your Medicare Wellness Visit. I appreciate your ongoing commitment to your health goals. Please review the following plan we discussed and let me know if I can assist you in the future.   Screening recommendations/referrals: Colonoscopy : Up to date  Mammogram : Up to date  Bone Density: Up to date  Recommended yearly ophthalmology/optometry visit for glaucoma screening and checkup Recommended yearly dental visit for hygiene and checkup  Vaccinations: Influenza vaccine- due annually in September/October Pneumococcal vaccine : Up to date  Tdap vaccine : Up to date  Shingles vaccine : Up to date     Advanced directives: yes  Conditions/risks identified: Cardiac Risk Factors include: advanced age (>1mn, >>16women);hypertension;diabetes mellitus  Next appointment: 1 year    Preventive Care 629Years and Older, Female Preventive care refers to lifestyle choices and visits with your health care provider that can promote health and wellness. What does preventive care include? A yearly physical exam. This is also called an annual well check. Dental exams once or twice a year. Routine eye exams. Ask your health care provider how often you should have your eyes checked. Personal lifestyle choices, including: Daily care of your teeth and gums. Regular physical activity. Eating a healthy diet. Avoiding tobacco and drug use. Limiting alcohol use. Practicing safe sex. Taking low-dose aspirin every day. Taking vitamin and mineral supplements as recommended by your health care provider. What happens during an annual well check? The services and screenings done by your health care provider during your annual well check will depend on your age, overall health, lifestyle risk factors, and family history of disease. Counseling  Your health care provider may ask you questions about your: Alcohol use. Tobacco use. Drug use. Emotional  well-being. Home and relationship well-being. Sexual activity. Eating habits. History of falls. Memory and ability to understand (cognition). Work and work [employer] Reproductive health. Screening  You may have the following tests or measurements: Height, weight, and BMI. Blood pressure. Lipid and cholesterol levels. These may be checked every 5 years, or more frequently if you are over 543years old. Skin check. Lung cancer screening. You may have this screening every year starting at age 2248if you have a 30-pack-year history of smoking and currently smoke or have quit within the past 15 years. Fecal occult blood test (FOBT) of the stool. You may have this test every year starting at age 74 Flexible sigmoidoscopy or colonoscopy. You may have a sigmoidoscopy every 5 years or a colonoscopy every 10 years starting at age 74 Hepatitis C blood test. Hepatitis B blood test. Sexually transmitted disease (STD) testing. Diabetes screening. This is done by checking your blood sugar (glucose) after you have not eaten for a while (fasting). You may have this done every 1-3 years. Bone density scan. This is done to screen for osteoporosis. You may have this done starting at age 74 Mammogram. This may be done every 1-2 years. Talk to your health care provider about how often you should have regular mammograms. Talk with your health care provider about your test results, treatment options, and if necessary, the need for more tests. Vaccines  Your health care provider may recommend certain vaccines, such as: Influenza vaccine. This is recommended every year. Tetanus, diphtheria, and acellular pertussis (Tdap, Td) vaccine. You may need a Td booster every 10 years. Zoster vaccine. You may need this after age 74 Pneumococcal 13-valent conjugate (PCV13) vaccine. One dose is recommended  after age 2. Pneumococcal polysaccharide (PPSV23) vaccine. One dose is recommended after age 57. Talk to your  health care provider about which screenings and vaccines you need and how often you need them. This information is not intended to replace advice given to you by your health care provider. Make sure you discuss any questions you have with your health care provider. Document Released: 04/29/2015 Document Revised: 12/21/2015 Document Reviewed: 02/01/2015 Elsevier Interactive Patient Education  2017 [place] Prevention in the Home Falls can cause injuries. They can happen to people of all ages. There are many things you can do to make your home safe and to help prevent falls. What can I do on the outside of my home? Regularly fix the edges of walkways and driveways and fix any cracks. Remove anything that might make you trip as you walk through a door, such as a raised step or threshold. Trim any bushes or trees on the path to your home. Use bright outdoor lighting. Clear any walking paths of anything that might make someone trip, such as rocks or tools. Regularly check to see if handrails are loose or broken. Make sure that both sides of any steps have handrails. Any raised decks and porches should have guardrails on the edges. Have any leaves, snow, or ice cleared regularly. Use sand or salt on walking paths during winter. Clean up any spills in your garage right away. This includes oil or grease spills. What can I do in the bathroom? Use night lights. Install grab bars by the toilet and in the tub and shower. Do not use towel bars as grab bars. Use non-skid mats or decals in the tub or shower. If you need to sit down in the shower, use a plastic, non-slip stool. Keep the floor dry. Clean up any water that spills on the floor as soon as it happens. Remove soap buildup in the tub or shower regularly. Attach bath mats securely with double-sided non-slip rug tape. Do not have throw rugs and other things on the floor that can make you trip. What can I do in the bedroom? Use night  lights. Make sure that you have a light by your bed that is easy to reach. Do not use any sheets or blankets that are too big for your bed. They should not hang down onto the floor. Have a firm chair that has side arms. You can use this for support while you get dressed. Do not have throw rugs and other things on the floor that can make you trip. What can I do in the kitchen? Clean up any spills right away. Avoid walking on wet floors. Keep items that you use a lot in easy-to-reach places. If you need to reach something above you, use a strong step stool that has a grab bar. Keep electrical cords out of the way. Do not use floor polish or wax that makes floors slippery. If you must use wax, use non-skid floor wax. Do not have throw rugs and other things on the floor that can make you trip. What can I do with my stairs? Do not leave any items on the stairs. Make sure that there are handrails on both sides of the stairs and use them. Fix handrails that are broken or loose. Make sure that handrails are as long as the stairways. Check any carpeting to make sure that it is firmly attached to the stairs. Fix any carpet that is loose or worn. Avoid having throw  rugs at the top or bottom of the stairs. If you do have throw rugs, attach them to the floor with carpet tape. Make sure that you have a light switch at the top of the stairs and the bottom of the stairs. If you do not have them, ask someone to add them for you. What else can I do to help prevent falls? Wear shoes that: Do not have high heels. Have rubber bottoms. Are comfortable and fit you well. Are closed at the toe. Do not wear sandals. If you use a stepladder: Make sure that it is fully opened. Do not climb a closed stepladder. Make sure that both sides of the stepladder are locked into place. Ask someone to hold it for you, if possible. Clearly mark and make sure that you can see: Any grab bars or handrails. First and last  steps. Where the edge of each step is. Use tools that help you move around (mobility aids) if they are needed. These include: Canes. Walkers. Scooters. Crutches. Turn on the lights when you go into a dark area. Replace any light bulbs as soon as they burn out. Set up your furniture so you have a clear path. Avoid moving your furniture around. If any of your floors are uneven, fix them. If there are any pets around you, be aware of where they are. Review your medicines with your doctor. Some medicines can make you feel dizzy. This can increase your chance of falling. Ask your doctor what other things that you can do to help prevent falls. This information is not intended to replace advice given to you by your health care provider. Make sure you discuss any questions you have with your health care provider. Document Released: 01/27/2009 Document Revised: 09/08/2015 Document Reviewed: 05/07/2014 Elsevier Interactive Patient Education  2017 [employer].

## 2022-01-08 NOTE — Telephone Encounter (Signed)
Sierra Ray, Sierra Ray are scheduled for a virtual visit with your provider today.    Just as we do with appointments in the office, we must obtain your consent to participate.  Your consent will be active for this visit and any virtual visit you may have with one of our providers in the next 365 days.    If you have a MyChart account, I can also send a copy of this consent to you electronically.  All virtual visits are billed to your insurance company just like a traditional visit in the office.  As this is a virtual visit, video technology does not allow for your provider to perform a traditional examination.  This may limit your provider's ability to fully assess your condition.  If your provider identifies any concerns that need to be evaluated in person or the need to arrange testing such as labs, EKG, etc, we will make arrangements to do so.    Although advances in technology are sophisticated, we cannot ensure that it will always work on either your end or our end.  If the connection with a video visit is poor, we may have to switch to a telephone visit.  With either a video or telephone visit, we are not always able to ensure that we have a secure connection.   I need to obtain your verbal consent now.   Are you willing to proceed with your visit today?   Sierra Biskup V. has provided verbal consent on 01/08/2022 for a virtual visit (video or telephone).   Sierra Ray, Sierra Ray 01/08/2022  8:57 AM

## 2022-01-19 ENCOUNTER — Telehealth: Payer: Self-pay

## 2022-01-19 DIAGNOSIS — G8929 Other chronic pain: Secondary | ICD-10-CM

## 2022-01-19 NOTE — Telephone Encounter (Addendum)
Patient called requesting new referral to a different orthopedic. She states that she was unsatisfied with [facility] and would like referral sent either to [facility] orthopedic or somewhere else that you may recommend. Please place referral.  Message routed to Marlowe Sax, NP

## 2022-01-22 DIAGNOSIS — J302 Other seasonal allergic rhinitis: Secondary | ICD-10-CM | POA: Diagnosis not present

## 2022-01-22 DIAGNOSIS — H43811 Vitreous degeneration, right eye: Secondary | ICD-10-CM | POA: Diagnosis not present

## 2022-01-22 DIAGNOSIS — K219 Gastro-esophageal reflux disease without esophagitis: Secondary | ICD-10-CM | POA: Diagnosis not present

## 2022-01-22 DIAGNOSIS — E119 Type 2 diabetes mellitus without complications: Secondary | ICD-10-CM | POA: Diagnosis not present

## 2022-01-22 DIAGNOSIS — H25811 Combined forms of age-related cataract, right eye: Secondary | ICD-10-CM | POA: Diagnosis not present

## 2022-01-22 DIAGNOSIS — Z961 Presence of intraocular lens: Secondary | ICD-10-CM | POA: Diagnosis not present

## 2022-01-22 DIAGNOSIS — H401132 Primary open-angle glaucoma, bilateral, moderate stage: Secondary | ICD-10-CM | POA: Diagnosis not present

## 2022-01-22 DIAGNOSIS — R6889 Other general symptoms and signs: Secondary | ICD-10-CM | POA: Diagnosis not present

## 2022-01-22 DIAGNOSIS — Q386 Other congenital malformations of mouth: Secondary | ICD-10-CM | POA: Diagnosis not present

## 2022-01-22 LAB — HM DIABETES EYE EXAM

## 2022-01-25 ENCOUNTER — Other Ambulatory Visit: Payer: Self-pay

## 2022-01-25 DIAGNOSIS — I152 Hypertension secondary to endocrine disorders: Secondary | ICD-10-CM

## 2022-01-25 DIAGNOSIS — K219 Gastro-esophageal reflux disease without esophagitis: Secondary | ICD-10-CM

## 2022-01-25 DIAGNOSIS — E1142 Type 2 diabetes mellitus with diabetic polyneuropathy: Secondary | ICD-10-CM

## 2022-01-31 ENCOUNTER — Ambulatory Visit: Payer: Medicare HMO | Admitting: Orthopaedic Surgery

## 2022-02-05 ENCOUNTER — Other Ambulatory Visit: Payer: Medicare HMO

## 2022-02-05 ENCOUNTER — Telehealth: Payer: Self-pay | Admitting: Cardiology

## 2022-02-05 DIAGNOSIS — E1142 Type 2 diabetes mellitus with diabetic polyneuropathy: Secondary | ICD-10-CM | POA: Diagnosis not present

## 2022-02-05 DIAGNOSIS — K219 Gastro-esophageal reflux disease without esophagitis: Secondary | ICD-10-CM | POA: Diagnosis not present

## 2022-02-05 DIAGNOSIS — I152 Hypertension secondary to endocrine disorders: Secondary | ICD-10-CM | POA: Diagnosis not present

## 2022-02-05 DIAGNOSIS — E1159 Type 2 diabetes mellitus with other circulatory complications: Secondary | ICD-10-CM | POA: Diagnosis not present

## 2022-02-05 NOTE — Telephone Encounter (Signed)
Pt aware that per Dr. Johney Frame, she does not need ASA 81 mg from a cardiac perspective, being her recent cardiac CT was excellent and Ca Score was 0.  Pt verbalized understanding and agrees with this plan. Pt was more than gracious for all the assistance provided.

## 2022-02-05 NOTE — Telephone Encounter (Signed)
She does not need it from a CV perspective. Her coronary CTA looked excellent. Ca score 0.

## 2022-02-05 NOTE — Telephone Encounter (Signed)
Pt states she use to take ASA 81 mg po daily several years ago, but stopped this due to upset stomach.   She went into see her PCP recently and PCP advised that she restart back taking coated ASA 81 mg po daily.  Pt is wanting Dr. Johney Frame to first advise if ASA 81 mg po daily is indicated for her cardiac history.  She states if Dr. Johney Frame doesn't think this is needed, then she will not restart back taking this medication.  Pt aware that I will route this message to Dr. Johney Frame to further review and advise on this matter, and I will follow-up with the pt accordingly thereafter.   Pt verbalized understanding and agrees with this plan.

## 2022-02-05 NOTE — Telephone Encounter (Signed)
Pt c/o medication issue:  1. Name of Medication: Aspirin   2. How are you currently taking this medication (dosage and times per day)?   3. Are you having a reaction (difficulty breathing--STAT)?   4. What is your medication issue? Pt has questions about aspirin that she would like to start taking, but would like to know more information on this. Please advise.

## 2022-02-06 LAB — COMPLETE METABOLIC PANEL WITH GFR
AG Ratio: 1.4 (calc) (ref 1.0–2.5)
ALT: 15 U/L (ref 6–29)
AST: 19 U/L (ref 10–35)
Albumin: 4.3 g/dL (ref 3.6–5.1)
Alkaline phosphatase (APISO): 62 U/L (ref 37–153)
BUN: 20 mg/dL (ref 7–25)
CO2: 26 mmol/L (ref 20–32)
Calcium: 9.7 mg/dL (ref 8.6–10.4)
Chloride: 104 mmol/L (ref 98–110)
Creat: 0.97 mg/dL (ref 0.60–1.00)
Globulin: 3.1 g/dL (calc) (ref 1.9–3.7)
Glucose, Bld: 127 mg/dL — ABNORMAL HIGH (ref 65–99)
Potassium: 4.2 mmol/L (ref 3.5–5.3)
Sodium: 142 mmol/L (ref 135–146)
Total Bilirubin: 0.4 mg/dL (ref 0.2–1.2)
Total Protein: 7.4 g/dL (ref 6.1–8.1)
eGFR: 61 mL/min/{1.73_m2} (ref >=60)

## 2022-02-06 LAB — CBC WITH DIFFERENTIAL/PLATELET
Absolute Monocytes: 459 cells/uL (ref 200–950)
Basophils Absolute: 50 cells/uL (ref 0–200)
Basophils Relative: 1.1 %
Eosinophils Absolute: 230 cells/uL (ref 15–500)
Eosinophils Relative: 5.1 %
HCT: 43.1 % (ref 35.0–45.0)
Hemoglobin: 14.5 g/dL (ref 11.7–15.5)
Lymphs Abs: 2124 cells/uL (ref 850–3900)
MCH: 28.8 pg (ref 27.0–33.0)
MCHC: 33.6 g/dL (ref 32.0–36.0)
MCV: 85.5 fL (ref 80.0–100.0)
MPV: 9.9 fL (ref 7.5–12.5)
Monocytes Relative: 10.2 %
Neutro Abs: 1638 cells/uL (ref 1500–7800)
Neutrophils Relative %: 36.4 %
Platelets: 172 10*3/uL (ref 140–400)
RBC: 5.04 10*6/uL (ref 3.80–5.10)
RDW: 13.1 % (ref 11.0–15.0)
Total Lymphocyte: 47.2 %
WBC: 4.5 10*3/uL (ref 3.8–10.8)

## 2022-02-06 LAB — MICROALBUMIN / CREATININE URINE RATIO
Creatinine, Urine: 204 mg/dL (ref 20–275)
Microalb Creat Ratio: 28 mcg/mg creat (ref <30)
Microalb, Ur: 5.7 mg/dL

## 2022-02-06 LAB — HEMOGLOBIN A1C
Hgb A1c MFr Bld: 6.9 % of total Hgb — ABNORMAL HIGH (ref <5.7)
Mean Plasma Glucose: 151 mg/dL
eAG (mmol/L): 8.4 mmol/L

## 2022-02-06 LAB — TSH: TSH: 1.84 mIU/L (ref 0.40–4.50)

## 2022-02-06 LAB — LIPID PANEL
Cholesterol: 159 mg/dL (ref <200)
HDL: 81 mg/dL (ref >=50)
LDL Cholesterol (Calc): 58 mg/dL (calc)
Non-HDL Cholesterol (Calc): 78 mg/dL (calc) (ref <130)
Total CHOL/HDL Ratio: 2 (calc) (ref <5.0)
Triglycerides: 114 mg/dL (ref <150)

## 2022-02-07 ENCOUNTER — Ambulatory Visit (INDEPENDENT_AMBULATORY_CARE_PROVIDER_SITE_OTHER): Payer: Medicare HMO

## 2022-02-07 ENCOUNTER — Ambulatory Visit: Payer: Medicare HMO | Admitting: Orthopaedic Surgery

## 2022-02-07 VITALS — BP 108/63 | HR 76 | Ht 66.0 in | Wt 148.0 lb

## 2022-02-07 DIAGNOSIS — M25511 Pain in right shoulder: Secondary | ICD-10-CM | POA: Diagnosis not present

## 2022-02-07 DIAGNOSIS — G8929 Other chronic pain: Secondary | ICD-10-CM

## 2022-02-07 MED ORDER — LIDOCAINE HCL 1 % IJ SOLN
0.5000 mL | INTRAMUSCULAR | Status: AC | PRN
  Administered 2022-02-07: .5 mL

## 2022-02-07 MED ORDER — BUPIVACAINE HCL 0.25 % IJ SOLN
4.0000 mL | INTRAMUSCULAR | Status: AC | PRN
  Administered 2022-02-07: 4 mL via INTRA_ARTICULAR

## 2022-02-07 MED ORDER — METHYLPREDNISOLONE ACETATE 40 MG/ML IJ SUSP
40.0000 mg | INTRAMUSCULAR | Status: AC | PRN
  Administered 2022-02-07: 40 mg via INTRA_ARTICULAR

## 2022-02-07 NOTE — Progress Notes (Signed)
Office Visit Note   Patient: Financial trader V.           Date of Birth: 10/11/1945           MRN: [id number] Visit Date: 02/07/2022              Requested by: [name], NP [address] [place],  [place] [address] PCP: [name], NP   Assessment & Plan: Visit Diagnoses:  1. Chronic right shoulder pain     Plan: Subacromial injection performed.  If she has persistent problems she will call and let us know and we can consider diagnostic imaging.  Follow-Up Instructions: No follow-ups on file.   Orders:  Orders Placed This Encounter  Procedures   XR Shoulder Right   No orders of the defined types were placed in this encounter.     Procedures: Large Joint Inj: R subacromial bursa on 02/07/2022 1:34 PM Indications: pain Details: 22 G 1.5 in needle  Arthrogram: No  Medications: 4 mL bupivacaine 0.25 %; 40 mg methylPREDNISolone acetate 40 MG/ML; 0.5 mL lidocaine 1 % Outcome: tolerated well, no immediate complications Procedure, treatment alternatives, risks and benefits explained, specific risks discussed. Consent was given by the patient. Immediately prior to procedure a time out was called to verify the correct patient, procedure, equipment, support staff and site/side marked as required. Patient was prepped and draped in the usual sterile fashion.       Clinical Data: No additional findings.   Subjective: Chief Complaint  Patient presents with   Right Shoulder - Pain    HPI 76 year old female had a fall in March with persistent pain in her shoulder problems fixing her hair dressing as straight reaching and overhead activity.  She has had problems reaching past the posterior axillary line.  Sometimes pain wakes her up at night sometimes she has noticed some numbness in her hand.  She denies associated neck pain.  X-rays were obtained in March after her fall and were negative for acute bone changes.  Patient ambulates with a cane switching  hands.  Review of Systems patient has type 2 diabetes with A1c below 7.  All of systems noncontributory to HPI.   Objective: Vital Signs: BP 108/63   Pulse 76   Ht '5\' 6"'$  (1.676 m)   Wt 148 lb (67.1 kg)   BMI 23.89 kg/m   Physical Exam Constitutional:      Appearance: She is well-developed.  HENT:     Head: Normocephalic.     Right Ear: External ear normal.     Left Ear: External ear normal. There is no impacted cerumen.  Eyes:     Pupils: Pupils are equal, round, and reactive to light.  Neck:     Thyroid: No thyromegaly.     Trachea: No tracheal deviation.  Cardiovascular:     Rate and Rhythm: Normal rate.  Pulmonary:     Effort: Pulmonary effort is normal.  Abdominal:     Palpations: Abdomen is soft.  Musculoskeletal:     Cervical back: No rigidity.  Skin:    General: Skin is warm and dry.  Neurological:     Mental Status: She is alert and oriented to person, place, and time.  Psychiatric:        Behavior: Behavior normal.     Ortho Exam right shoulder negative Yergason negative Hawkins positive Neer no subluxation no brachial plexus tenderness.  Negative drop arm test but it does reproduce her pain.  She can reach to L5 internal rotation.  Negative crossarm test.  No thenar atrophy.  Specialty Comments:  No specialty comments available.  Imaging: No results found.   PMFS History: Patient Active Problem List   Diagnosis Date Noted   Intussusception of small bowel ([place]) 11/21/2021   Abdominal pain 08/21/2021   Adaptive colitis 08/21/2021   Allergic rhinitis 08/21/2021   Anxiety state 08/21/2021   Cramp of limb 08/21/2021   Diabetes mellitus with polyneuropathy ([place]) 08/21/2021   Dysphonia 08/21/2021   Generalized OA 08/21/2021   LBP (low back pain) 08/21/2021   Primary generalized (osteo)arthritis 08/21/2021   Type II diabetes mellitus with neurological manifestations ([place]) 08/21/2021   Hypothyroidism (acquired) 08/10/2021   Claudication ([place])  08/04/2020   Vitreous floaters, left 04/19/2020   Posterior vitreous detachment, left eye 04/19/2020   Vitreomacular traction, left 03/22/2020   Posterior vitreous detachment of right eye 03/22/2020   Nuclear sclerotic cataract of right eye 03/22/2020   Chronic pain of both knees 08/12/2019   Unsteady gait 08/12/2019   Slow transit constipation 08/12/2019   High risk medication use 07/24/2017   Depression 01/23/2017   Costochondritis 07/25/2016   Varicose veins of both lower extremities with complications 90/30/0923   Short-term memory loss 02/03/2016   Pain in joint, multiple sites 02/03/2016   Chronic seasonal allergic rhinitis due to pollen 02/03/2016   Gastroesophageal reflux disease without esophagitis 07/03/2015   Type 2 diabetes mellitus with diabetic polyneuropathy, without long-term current use of insulin ([place]) 07/03/2015   Hyperlipidemia LDL goal <100 07/03/2015   Benign essential HTN 07/03/2015   Encounter for gynecological examination with abnormal finding 10/01/2014   Mastodynia 10/01/2014   Carpal tunnel syndrome 05/20/2014   Essential hypertension 01/15/2014   Palpitations 01/15/2014   Hyperlipidemia 01/15/2014   DOE (dyspnea on exertion) 01/15/2014   PAC (premature atrial contraction) 01/15/2014   S/P total hysterectomy 06/21/2011   Past Medical History:  Diagnosis Date   Abnormal Pap smear 05/08/2011   ASC-US +HPV   Arthritis    Asthma    Constipation    Diabetes mellitus type 2 with neurological manifestations (HCC)    Diabetic peripheral neuropathy (HCC)    Generalized osteoarthritis    GERD (gastroesophageal reflux disease)    Hx of seasonal allergies    Hyperlipidemia    Hypertension    IBS (irritable bowel syndrome)    S/P total hysterectomy 06/21/2011    Family History  Problem Relation Age of Onset   Diabetes Mother    Cancer Mother        colon    Diabetes Father    Heart disease Father    Pancreatic cancer Sister    Breast cancer Maternal  Aunt    Breast cancer Paternal Aunt    Cancer Paternal Grandmother        liver    Breast cancer Cousin    Breast cancer Cousin    Pancreatic cancer Brother     Past Surgical History:  Procedure Laterality Date   CATARACT EXTRACTION Left    EYE SURGERY Right 01/2018   Removed raised area on eye. Dr.Groat    OVARIAN CYST SURGERY  1986   PELVIC LAPAROSCOPY     TONSILECTOMY, ADENOIDECTOMY, BILATERAL MYRINGOTOMY AND TUBES  1974   VAGINAL HYSTERECTOMY  1983   Social History   Occupational History    Employer: RETIRED  Tobacco Use   Smoking status: Never   Smokeless tobacco: Never  Vaping Use   Vaping Use: Never  used  Substance and Sexual Activity   Alcohol use: No   Drug use: No   Sexual activity: Not Currently    Birth control/protection: Surgical    Comment: 1st intercourse- 20, partners- 8,

## 2022-02-09 ENCOUNTER — Ambulatory Visit (INDEPENDENT_AMBULATORY_CARE_PROVIDER_SITE_OTHER): Payer: Medicare HMO | Admitting: Family

## 2022-02-09 ENCOUNTER — Encounter: Payer: Self-pay | Admitting: Family

## 2022-02-09 VITALS — BP 116/70 | HR 93 | Temp 97.5°F | Resp 16 | Ht 66.0 in | Wt 148.0 lb

## 2022-02-09 DIAGNOSIS — E785 Hyperlipidemia, unspecified: Secondary | ICD-10-CM

## 2022-02-09 DIAGNOSIS — I152 Hypertension secondary to endocrine disorders: Secondary | ICD-10-CM

## 2022-02-09 DIAGNOSIS — E1142 Type 2 diabetes mellitus with diabetic polyneuropathy: Secondary | ICD-10-CM | POA: Diagnosis not present

## 2022-02-09 DIAGNOSIS — E1169 Type 2 diabetes mellitus with other specified complication: Secondary | ICD-10-CM | POA: Diagnosis not present

## 2022-02-09 DIAGNOSIS — K219 Gastro-esophageal reflux disease without esophagitis: Secondary | ICD-10-CM | POA: Diagnosis not present

## 2022-02-09 DIAGNOSIS — E1159 Type 2 diabetes mellitus with other circulatory complications: Secondary | ICD-10-CM | POA: Diagnosis not present

## 2022-02-09 MED ORDER — AMLODIPINE BESYLATE 5 MG PO TABS: 5.0000 mg | ORAL_TABLET | Freq: Every day | ORAL | 1 refills | Status: DC

## 2022-02-09 NOTE — Progress Notes (Signed)
Provider: [name]   Teion Ballin, [name], NP  Patient Care Team: Sherrye Puga, [name], NP as PCP - General (Family Medicine) Freada Bergeron, MD as PCP - Cardiology (Cardiology) Dorothy Spark, MD as Consulting Physician (Cardiology) Clent Jacks, MD as Consulting Physician (Ophthalmology) Carol Ada, MD as Consulting Physician (Gastroenterology) Michael Boston, MD as Consulting Physician (General Surgery)  Extended Emergency Contact Information Primary Emergency Contact: Gallick,[name] Mobile Phone: [phone] Relation: Daughter  Code Status:  Full Code  Goals of care: Advanced Directive information    02/09/2022   11:07 AM  Advanced Directives  Does Patient Have a Medical Advance Directive? Yes  Type of Advance Directive [place]  Does patient want to make changes to medical advance directive? No - Patient declined  Copy of [place] in Chart? Yes - validated most recent copy scanned in chart (See row information)     Chief Complaint  Patient presents with   Medical Management of Chronic Issues    6 month follow up.    Review Labs     Review recent labs.    Immunizations    Discuss the need for [employer].    HPI:  Pt is a 76 y.o. female seen today for 6 months follow up for medical management of chronic diseases.    Labs reviewed and discussed during visit   Hgb A 1 C 6.9 previous 6.5 states has been craving sweets for the past month.  Home blood sugars ranging in the 100's to 120's.  She reports no symptoms of hypoglycemia.  Also states blood pressure at home readings running in the 110's over 70's.  She has stopped taking lisinopril after she read side effects.she denies any headache,dizziness,vision changes,fatigue,chest tightness,palpitation,chest pain or shortness of breath.   States her bowels have been moving regularly constipation has resolved.  Follows up with gastroenterology. Also  seeing orthopedic specialist for her chronic right shoulder  Past Medical History:  Diagnosis Date   Abnormal Pap smear 05/08/2011   ASC-US +HPV   Arthritis    Asthma    Constipation    Diabetes mellitus type 2 with neurological manifestations (HCC)    Diabetic peripheral neuropathy (HCC)    Generalized osteoarthritis    GERD (gastroesophageal reflux disease)    Hx of seasonal allergies    Hyperlipidemia    Hypertension    IBS (irritable bowel syndrome)    S/P total hysterectomy 06/21/2011   Past Surgical History:  Procedure Laterality Date   CATARACT EXTRACTION Left    EYE SURGERY Right 01/2018   Removed raised area on eye. Dr.Groat    OVARIAN CYST SURGERY  1986   PELVIC LAPAROSCOPY     TONSILECTOMY, ADENOIDECTOMY, BILATERAL MYRINGOTOMY AND TUBES  1974   VAGINAL HYSTERECTOMY  1983    Allergies  Allergen Reactions   Aspirin    Pravastatin Sodium Other (See Comments)    Muscle aches   Restasis [Cyclosporine] Other (See Comments)   Lisinopril Other (See Comments)    Blood pressures low and causes constipation    Allergies as of 02/09/2022       Reactions   Aspirin    Pravastatin Sodium Other (See Comments)   Muscle aches   Restasis [cyclosporine] Other (See Comments)   Lisinopril Other (See Comments)   Blood pressures low and causes constipation        Medication List        Accurate as of February 09, 2022 11:20 AM.  If you have any questions, ask your nurse or doctor.          STOP taking these medications    lisinopril 2.5 MG tablet Commonly known as: ZESTRIL Stopped by: [name], NP       TAKE these medications    Accu-Chek Aviva Plus test strip Generic drug: glucose blood Use to test blood sugar twice daily. Dx:E11.42   Accu-Chek Aviva Plus w/Device Kit Use to test blood sugar twice daily. Dx E11.42   Accu-Chek Softclix Lancet Dev Kit Use to test blood sugar. Dx: E11.42   Accu-Chek Softclix Lancets lancets Use to test blood  sugar one time daily for diabetes. Dx:E11.42   acetaminophen 500 MG tablet Commonly known as: TYLENOL Take 1 tablet (500 mg total) by mouth every 8 (eight) hours as needed for moderate pain.   Align 4 MG Caps Take 1 capsule by mouth daily.   amLODipine 5 MG tablet Commonly known as: NORVASC Take 5 mg by mouth daily. What changed: Another medication with the same name was removed. Continue taking this medication, and follow the directions you see here. Changed by: [name], NP   b complex vitamins capsule Take 1 capsule by mouth daily.   B-D SINGLE USE SWABS REGULAR Pads Use to test blood sugar twice daily. Dx: E11.42   cholecalciferol 1000 units tablet Commonly known as: VITAMIN D Take 1,000 Units by mouth daily.   fluticasone 50 MCG/ACT nasal spray Commonly known as: FLONASE Place 2 sprays into both nostrils as needed.   latanoprost 0.005 % ophthalmic solution Commonly known as: XALATAN 1 drop at bedtime.   loratadine 10 MG tablet Commonly known as: CLARITIN Take 10 mg by mouth daily as needed.   metFORMIN 500 MG tablet Commonly known as: GLUCOPHAGE TAKE 2 TABLETS EVERY DAY WITH BREAKFAST   metoprolol tartrate 25 MG tablet Commonly known as: LOPRESSOR Take 0.5 tablets (12.5 mg total) by mouth 2 (two) times daily as needed (palpitations).   multivitamin-lutein Caps capsule Take 1 capsule by mouth daily.   multivitamin with minerals tablet Take 1 tablet by mouth daily.   polyethylene glycol 17 g packet Commonly known as: MIRALAX / GLYCOLAX Take 17 g by mouth daily as needed. For constipation   rosuvastatin 5 MG tablet Commonly known as: CRESTOR TAKE 1 TABLET EVERY DAY   SYSTANE COMPLETE OP Place 1 drop into both eyes in the morning, at noon, in the evening, and at bedtime.        Review of Systems  Constitutional:  Negative for appetite change, chills, fatigue, fever and unexpected weight change.  HENT:  Negative for congestion, dental  problem, ear discharge, ear pain, facial swelling, hearing loss, nosebleeds, postnasal drip, rhinorrhea, sinus pressure, sinus pain, sneezing, sore throat, tinnitus and trouble swallowing.   Eyes:  Negative for pain, discharge, redness, itching and visual disturbance.  Respiratory:  Negative for cough, chest tightness, shortness of breath and wheezing.   Cardiovascular:  Negative for chest pain, palpitations and leg swelling.  Gastrointestinal:  Negative for abdominal distention, abdominal pain, blood in stool, constipation, diarrhea, nausea and vomiting.  Endocrine: Negative for cold intolerance, heat intolerance, polydipsia, polyphagia and polyuria.  Genitourinary:  Negative for difficulty urinating, dysuria, flank pain, frequency and urgency.  Musculoskeletal:  Negative for arthralgias, back pain, gait problem, joint swelling, myalgias, neck pain and neck stiffness.  Skin:  Negative for color change, pallor, rash and wound.  Neurological:  Negative for dizziness, syncope, speech difficulty, weakness, light-headedness, numbness  and headaches.  Hematological:  Does not bruise/bleed easily.  Psychiatric/Behavioral:  Negative for agitation, behavioral problems, confusion, hallucinations, self-injury, sleep disturbance and suicidal ideas. The patient is not nervous/anxious.     Immunization History  Administered Date(s) Administered   Fluad Quad(high Dose 65+) 02/11/2019, 02/12/2020, 01/27/2021   Influenza, High Dose Seasonal PF 01/28/2018, 01/23/2022   Influenza,inj,Quad PF,6+ Mos 02/03/2016, 01/23/2017   Moderna Sars-Covid-2 Vaccination 06/22/2019, 07/24/2019, 03/24/2020   PFIZER(Purple Top)SARS-COV-2 Vaccination 04/26/2021   Pneumococcal Conjugate-13 07/30/2014   Pneumococcal Polysaccharide-23 11/17/2005, 05/20/2012   Td 08/18/2010, 02/16/2021   Tdap 02/16/2021   Pertinent  Health Maintenance Due  Topic Date Due   HEMOGLOBIN A1C  08/07/2022   FOOT EXAM  01/02/2023   OPHTHALMOLOGY EXAM   01/23/2023   MAMMOGRAM  07/26/2023   COLONOSCOPY (Pts 45-39yr Insurance coverage will need to be confirmed)  09/05/2025   INFLUENZA VACCINE  Completed   DEXA SCAN  Completed      05/05/2021   10:35 AM 06/19/2021    3:26 PM 08/03/2021   10:17 AM 01/08/2022    8:41 AM 02/09/2022   11:07 AM  Fall Risk  Falls in the past year? 0 1 0 1 0  Was there an injury with Fall? 0 1 0 0 0  Fall Risk Category Calculator 0 2 0 1 0  Fall Risk Category Low Moderate Low Low Low  Patient Fall Risk Level Low fall risk Moderate fall risk Low fall risk Low fall risk Low fall risk  Patient at Risk for Falls Due to No Fall Risks Impaired balance/gait No Fall Risks No Fall Risks No Fall Risks  Fall risk Follow up Falls evaluation completed Falls evaluation completed;Education provided;Falls prevention discussed Falls evaluation completed Falls evaluation completed Falls evaluation completed   Functional Status Survey:    Vitals:   02/09/22 1105  BP: 116/70  Pulse: 93  Resp: 16  Temp: (!) 97.5 F (36.4 C)  SpO2: 95%  Weight: 148 lb (67.1 kg)  Height: _0  (1.676 m)   Body mass index is 23.89 kg/m. Physical Exam Vitals reviewed.  Constitutional:      General: She is not in acute distress.    Appearance: Normal appearance. She is normal weight. She is not ill-appearing or diaphoretic.  HENT:     Head: Normocephalic.     Right Ear: Tympanic membrane, ear canal and external ear normal. There is no impacted cerumen.     Left Ear: Tympanic membrane, ear canal and external ear normal. There is no impacted cerumen.     Nose: Nose normal. No congestion or rhinorrhea.     Mouth/Throat:     Mouth: Mucous membranes are moist.     Pharynx: Oropharynx is clear. No oropharyngeal exudate or posterior oropharyngeal erythema.  Eyes:     General: No scleral icterus.       Right eye: No discharge.        Left eye: No discharge.     Extraocular Movements: Extraocular movements intact.     Conjunctiva/sclera:  Conjunctivae normal.     Pupils: Pupils are equal, round, and reactive to light.  Neck:     Vascular: No carotid bruit.  Cardiovascular:     Rate and Rhythm: Normal rate and regular rhythm.     Pulses: Normal pulses.     Heart sounds: Normal heart sounds. No murmur heard.    No friction rub. No gallop.  Pulmonary:     Effort: Pulmonary effort is normal. No respiratory distress.  Breath sounds: Normal breath sounds. No wheezing, rhonchi or rales.  Chest:     Chest wall: No tenderness.  Abdominal:     General: Bowel sounds are normal. There is no distension.     Palpations: Abdomen is soft. There is no mass.     Tenderness: There is no abdominal tenderness. There is no right CVA tenderness, left CVA tenderness, guarding or rebound.  Musculoskeletal:        General: No swelling or tenderness. Normal range of motion.     Cervical back: Normal range of motion. No rigidity or tenderness.     Right lower leg: No edema.     Left lower leg: No edema.  Lymphadenopathy:     Cervical: No cervical adenopathy.  Skin:    General: Skin is warm and dry.     Coloration: Skin is not pale.     Findings: No bruising, erythema, lesion or rash.  Neurological:     Mental Status: She is alert and oriented to person, place, and time.     Cranial Nerves: No cranial nerve deficit.     Sensory: No sensory deficit.     Motor: No weakness.     Coordination: Coordination normal.     Gait: Gait normal.  Psychiatric:        Mood and Affect: Mood normal.        Speech: Speech normal.        Behavior: Behavior normal.        Thought Content: Thought content normal.        Judgment: Judgment normal.     Labs reviewed: Recent Labs    08/04/21 1533 12/05/21 1555 02/05/22 0820  NA 143 139 142  K 4.0 4.0 4.2  CL 106 99 104  CO2 _0 GLUCOSE 107* 94 127*  BUN _1 CREATININE 0.91 0.89 0.97  CALCIUM 9.5 10.1 9.7  MG  --  2.2  --    Recent Labs    08/04/21 1533 02/05/22 0820  AST 18  19  ALT 11 15  BILITOT 0.4 0.4  PROT 6.8 7.4   Recent Labs    08/04/21 1533 02/05/22 0820  WBC 4.9 4.5  NEUTROABS 1,588 1,638  HGB 13.6 14.5  HCT 41.5 43.1  MCV 87.0 85.5  PLT 153 172   Lab Results  Component Value Date   TSH 1.84 02/05/2022   Lab Results  Component Value Date   HGBA1C 6.9 (H) 02/05/2022   Lab Results  Component Value Date   CHOL 159 02/05/2022   HDL 81 02/05/2022   LDLCALC 58 02/05/2022   TRIG 114 02/05/2022   CHOLHDL 2.0 02/05/2022    Significant Diagnostic Results in last 30 days:  XR Shoulder Right  Result Date: 02/07/2022 Three-view x-rays right shoulder obtained and reviewed.  This shows normal glenohumeral joint.  Mild degenerative acromioclavicular changes.  No soft tissue calcification. Pression: Right shoulder negative for acute or chronic changes.   Assessment/Plan 1. Type 2 diabetes mellitus with diabetic polyneuropathy, without long-term current use of insulin (HCC) Lab Results  Component Value Date   HGBA1C 6.9 (H) 02/05/2022  CBG well controlled -Continue on metformin -We will continue to monitor for hypoglycemia and DC metformin - TSH; Future - COMPLETE METABOLIC PANEL WITH GFR; Future - CBC with Differential/Platelet; Future - Hemoglobin A1c; Future  2. Hyperlipidemia associated with type 2 diabetes mellitus ([place]) LDL at goal Continue on rosuvastatin - Lipid panel; Future  3.  Gastroesophageal reflux disease without esophagitis Symptoms controlled - CBC with Differential/Platelet; Future  4. Hypertension associated with diabetes ([place]) Blood pressure at goal -Continue on Metroprolol and amlodipine On statin for cardiovascular event prevention but not on aspirin.  She stopped lisinopril after she did side effects that causes constipation. - amLODipine (NORVASC) 5 MG tablet; Take 1 tablet (5 mg total) by mouth daily.  Dispense: 90 tablet; Refill: 1  Family/ staff Communication: Reviewed plan of care with patient  verbalized understanding  Labs/tests ordered:  - CBC with Differential/Platelet - CMP with eGFR([facility]) - TSH - Hgb A1C - Lipid panel  Next Appointment : Return in about 6 months (around 08/11/2022) for medical mangement of chronic issues., fasting labs in one week.   [name], NP

## 2022-03-05 DIAGNOSIS — R6889 Other general symptoms and signs: Secondary | ICD-10-CM | POA: Diagnosis not present

## 2022-03-12 DIAGNOSIS — R6889 Other general symptoms and signs: Secondary | ICD-10-CM | POA: Diagnosis not present

## 2022-03-19 DIAGNOSIS — R6889 Other general symptoms and signs: Secondary | ICD-10-CM | POA: Diagnosis not present

## 2022-04-04 ENCOUNTER — Ambulatory Visit: Payer: Medicare HMO | Admitting: Podiatry

## 2022-04-04 DIAGNOSIS — E1149 Type 2 diabetes mellitus with other diabetic neurological complication: Secondary | ICD-10-CM | POA: Diagnosis not present

## 2022-04-04 DIAGNOSIS — M79676 Pain in unspecified toe(s): Secondary | ICD-10-CM | POA: Diagnosis not present

## 2022-04-04 DIAGNOSIS — B351 Tinea unguium: Secondary | ICD-10-CM | POA: Diagnosis not present

## 2022-04-05 ENCOUNTER — Encounter: Payer: Self-pay | Admitting: Podiatry

## 2022-04-05 NOTE — Progress Notes (Signed)
  Subjective:  Patient ID: Sierra Sheetz V., female    DOB: [birth year],  MRN: [id number]  Sierra Poteat V. presents to clinic today for at risk foot care with history of diabetic neuropathy and painful thick toenails that are difficult to trim. Pain interferes with ambulation. Aggravating factors include wearing enclosed shoe gear. Pain is relieved with periodic professional debridement.  Chief Complaint  Patient presents with   Nail Problem    DFC  BG - 108 this morning A1c - 6.7  PCP - [name] [name] , last OV 01/2022   New problem(s): None.   PCP is [name], [name], NP.  Allergies  Allergen Reactions   Aspirin    Pravastatin Sodium Other (See Comments)    Muscle aches   Restasis [Cyclosporine] Other (See Comments)   Lisinopril Other (See Comments)    Blood pressures low and causes constipation    Review of Systems: Negative except as noted in the HPI.  Objective: No changes noted in today's physical examination. There were no vitals filed for this visit.  Sierra Crain V. is a pleasant 76 y.o. female WD, WN in NAD. AAO x 3.  Vascular Examination: Capillary refill time to digits immediate b/l. Palpable DP pulse(s) b/l LE. Palpable PT pulse(s) b/l LE. Pedal hair sparse. No pain with calf compression b/l. Lower extremity skin temperature gradient within normal limits. Varicosities present b/l.  Dermatological Examination: Pedal skin is warm and supple b/l LE. No open wounds b/l LE. No interdigital macerations noted b/l LE. Toenails 1-5 bilaterally elongated, discolored, dystrophic, thickened, and crumbly with subungual debris and tenderness to dorsal palpation. Evidence of chronic venous insufficiency b/l lower extremities.  Neurological Examination: Pt has subjective symptoms of neuropathy. Protective sensation intact 5/5 intact bilaterally with 10g monofilament b/l. Vibratory sensation intact b/l.  Musculoskeletal Examination: Muscle strength 5/5 to all  lower extremity muscle groups bilaterally. No pain, crepitus or joint limitation noted with ROM bilateral LE. No gross bony deformities bilaterally. Utilizes cane for ambulation assistance.  Assessment/Plan: 1. Pain due to onychomycosis of nail   2. Type II diabetes mellitus with neurological manifestations ([place])     No orders of the defined types were placed in this encounter.  -Consent given for treatment as described below: -Examined patient. -Continue foot and shoe inspections daily. Monitor blood glucose per PCP/Endocrinologist's recommendations. -Continue supportive shoe gear daily. -Mycotic toenails 1-5 bilaterally were debrided in length and girth with sterile nail nippers and dremel without incident. -Patient/POA to call should there be question/concern in the interim.   Return in about 3 months (around 07/04/2022).  Marzetta Board, DPM

## 2022-05-01 ENCOUNTER — Other Ambulatory Visit: Payer: Self-pay | Admitting: Family

## 2022-05-01 ENCOUNTER — Other Ambulatory Visit: Payer: Self-pay

## 2022-05-01 DIAGNOSIS — E1169 Type 2 diabetes mellitus with other specified complication: Secondary | ICD-10-CM

## 2022-05-01 DIAGNOSIS — E1142 Type 2 diabetes mellitus with diabetic polyneuropathy: Secondary | ICD-10-CM

## 2022-05-01 MED ORDER — METOPROLOL TARTRATE 25 MG PO TABS: 12.5000 mg | ORAL_TABLET | Freq: Two times a day (BID) | ORAL | 2 refills | Status: DC | PRN

## 2022-05-01 NOTE — Telephone Encounter (Signed)
Patient medication has Allergy Contraindication.

## 2022-05-01 NOTE — Telephone Encounter (Signed)
Tolerates Crestor better than pravastatin.

## 2022-05-10 ENCOUNTER — Ambulatory Visit (INDEPENDENT_AMBULATORY_CARE_PROVIDER_SITE_OTHER): Payer: Medicare HMO

## 2022-05-10 DIAGNOSIS — E1149 Type 2 diabetes mellitus with other diabetic neurological complication: Secondary | ICD-10-CM

## 2022-05-10 DIAGNOSIS — E1142 Type 2 diabetes mellitus with diabetic polyneuropathy: Secondary | ICD-10-CM

## 2022-05-10 NOTE — Progress Notes (Signed)
Patient presents to the office today for diabetic shoe and insole measuring.  Patient was measured with brannock device to determine size and width for 1 pair of extra depth shoes and foam casted for 3 pair of insoles.   Documentation of medical necessity will be sent to patient's treating diabetic doctor to verify and sign.   Patient's diabetic provider: Marlowe Sax, NP at [facility]  Shoes and insoles will be ordered at that time and patient will be notified for an appointment for fitting when they arrive.   Shoe size (per patient): 10 wide women's   Brannock measurement: 10 wide women"s  Patient shoe selection-   Shoe choice:   [id number] Apex Balance SHoe  Shoe size ordered: 10.5 wide women's

## 2022-05-30 ENCOUNTER — Encounter: Payer: Self-pay | Admitting: Family

## 2022-05-30 ENCOUNTER — Ambulatory Visit (INDEPENDENT_AMBULATORY_CARE_PROVIDER_SITE_OTHER): Payer: Medicare HMO | Admitting: Family

## 2022-05-30 VITALS — BP 120/80 | HR 81 | Temp 97.5°F | Resp 18 | Ht 66.0 in | Wt 147.4 lb

## 2022-05-30 DIAGNOSIS — M25512 Pain in left shoulder: Secondary | ICD-10-CM | POA: Diagnosis not present

## 2022-05-30 DIAGNOSIS — G4700 Insomnia, unspecified: Secondary | ICD-10-CM | POA: Diagnosis not present

## 2022-05-30 NOTE — Progress Notes (Signed)
Provider: [name]  Sierra Ray, Sierra Bucks, NP  Patient Care Team: Sierra Ray, Sierra Bucks, NP as PCP - General (Family Medicine) Freada Bergeron, MD as PCP - Cardiology (Cardiology) Dorothy Spark, MD as Consulting Physician (Cardiology) Clent Jacks, MD as Consulting Physician (Ophthalmology) Carol Ada, MD as Consulting Physician (Gastroenterology) Michael Boston, MD as Consulting Physician (General Surgery)  Extended Emergency Contact Information Primary Emergency Contact: Ray,Sierra Mobile Phone: [phone] Relation: Daughter  Code Status: Full Code  Goals of care: Advanced Directive information    02/09/2022   11:07 AM  Advanced Directives  Does Patient Have a Medical Advance Directive? Yes  Type of Advance Directive [place]  Does patient want to make changes to medical advance directive? No - Patient declined  Copy of [place] in Chart? Yes - validated most recent copy scanned in chart (See row information)     Chief Complaint  Patient presents with   Acute Visit    Patient states that she has been having Neck and shoulder pain over a month. Patient also states that she has been using home remedies such as ice packs, heating pad and has taken medication for pain.    HPI:  Pt is a 77 y.o. female seen today for an acute visit for evaluation of neck and shoulder pain over a month. Has used  home remedies such as Vicks vapor,rub ice packs, heating pad and has taken medication for pain without any relief. Unable to fasten her bra.she denies any numbness,tingling or weakness on her hands.Also denies any injuries to shoulder.  Also complains of inability to sleep.usually watches TV or reads in the living until she is sleepy but when she tries to go to bed cannot sleep. Has taken melatonin and other sleeping pills in the past without any relief.Has seen a psychologist. Has been staying up up to 4 in the morning  watching shows. Feels tired during the days from lack of sleep. Has felt depressed from lack of sleep. States does not want to take medication.copes by praying and listening to christian music / speakers.    Past Medical History:  Diagnosis Date   Abnormal Pap smear 05/08/2011   ASC-US +HPV   Arthritis    Asthma    Constipation    Diabetes mellitus type 2 with neurological manifestations (HCC)    Diabetic peripheral neuropathy (HCC)    Generalized osteoarthritis    GERD (gastroesophageal reflux disease)    Hx of seasonal allergies    Hyperlipidemia    Hypertension    IBS (irritable bowel syndrome)    S/P total hysterectomy 06/21/2011   Past Surgical History:  Procedure Laterality Date   CATARACT EXTRACTION Left    EYE SURGERY Right 01/2018   Removed raised area on eye. Dr.Groat    OVARIAN CYST SURGERY  1986   PELVIC LAPAROSCOPY     TONSILECTOMY, ADENOIDECTOMY, BILATERAL MYRINGOTOMY AND TUBES  1974   VAGINAL HYSTERECTOMY  1983    Allergies  Allergen Reactions   Aspirin    Pravastatin Sodium Other (See Comments)    Muscle aches   Restasis [Cyclosporine] Other (See Comments)   Lisinopril Other (See Comments)    Blood pressures low and causes constipation    Outpatient Encounter Medications as of 05/30/2022  Medication Sig   Accu-Chek Softclix Lancets lancets Use to test blood sugar one time daily for diabetes. Dx:E11.42   acetaminophen (TYLENOL) 500 MG tablet Take 1 tablet (500 mg total) by mouth  every 8 (eight) hours as needed for moderate pain.   Alcohol Swabs (DROPSAFE ALCOHOL PREP) 70 % PADS USE TO TEST BLOOD SUGAR TWICE DAILY.   amLODipine (NORVASC) 5 MG tablet Take 1 tablet (5 mg total) by mouth daily.   b complex vitamins capsule Take 1 capsule by mouth daily.   Blood Glucose Monitoring Suppl (ACCU-CHEK AVIVA PLUS) w/Device KIT Use to test blood sugar twice daily. Dx E11.42   cholecalciferol (VITAMIN D) 1000 UNITS tablet Take 1,000 Units by mouth daily.   FLUAD  QUADRIVALENT 0.5 ML injection    fluticasone (FLONASE) 50 MCG/ACT nasal spray Place 2 sprays into both nostrils as needed.    glucose blood (ACCU-CHEK AVIVA PLUS) test strip USE TO TEST BLOOD SUGAR TWICE DAILY.   Lancets Misc. (ACCU-CHEK SOFTCLIX LANCET DEV) KIT Use to test blood sugar. Dx: E11.42   latanoprost (XALATAN) 0.005 % ophthalmic solution 1 drop at bedtime.   loratadine (CLARITIN) 10 MG tablet Take 10 mg by mouth daily as needed.   metFORMIN (GLUCOPHAGE) 500 MG tablet TAKE 2 TABLETS EVERY DAY WITH BREAKFAST   metoprolol tartrate (LOPRESSOR) 25 MG tablet Take 0.5 tablets (12.5 mg total) by mouth 2 (two) times daily as needed (palpitations).   Multiple Vitamins-Minerals (MULTIVITAMIN WITH MINERALS) tablet Take 1 tablet by mouth daily.   multivitamin-lutein (OCUVITE-LUTEIN) CAPS capsule Take 1 capsule by mouth daily.   ofloxacin (OCUFLOX) 0.3 % ophthalmic solution    polyethylene glycol (MIRALAX / GLYCOLAX) packet Take 17 g by mouth daily as needed. For constipation   prednisoLONE acetate (PRED FORTE) 1 % ophthalmic suspension SMARTSIG:In Eye(s)   Probiotic Product (ALIGN) 4 MG CAPS Take 1 capsule by mouth daily.    Propylene Glycol (SYSTANE COMPLETE OP) Place 1 drop into both eyes in the morning, at noon, in the evening, and at bedtime.   rosuvastatin (CRESTOR) 5 MG tablet TAKE 1 TABLET EVERY DAY   No facility-administered encounter medications on file as of 05/30/2022.    Review of Systems  Constitutional:  Negative for appetite change, chills, fatigue, fever and unexpected weight change.  Respiratory:  Negative for cough, chest tightness, shortness of breath and wheezing.   Cardiovascular:  Negative for chest pain, palpitations and leg swelling.  Musculoskeletal:  Positive for arthralgias, gait problem and neck pain. Negative for back pain, joint swelling, myalgias and neck stiffness.       Left shoulder pain  Right shoulder pain has improved   Skin:  Negative for color change,  pallor, rash and wound.  Neurological:  Negative for dizziness, weakness, light-headedness, numbness and headaches.  Psychiatric/Behavioral:  Positive for sleep disturbance. Negative for agitation, behavioral problems, confusion and hallucinations. The patient is not nervous/anxious.     Immunization History  Administered Date(s) Administered   Fluad Quad(high Dose 65+) 02/11/2019, 02/12/2020, 01/27/2021   Influenza, High Dose Seasonal PF 01/28/2018, 01/23/2022   Influenza,inj,Quad PF,6+ Mos 02/03/2016, 01/23/2017   Moderna Sars-Covid-2 Vaccination 06/22/2019, 07/24/2019, 03/24/2020   PFIZER(Purple Top)SARS-COV-2 Vaccination 04/26/2021   Pneumococcal Conjugate-13 07/30/2014   Pneumococcal Polysaccharide-23 11/17/2005, 05/20/2012   Td 08/18/2010, 02/16/2021   Tdap 02/16/2021   Pertinent  Health Maintenance Due  Topic Date Due   HEMOGLOBIN A1C  08/07/2022   FOOT EXAM  01/02/2023   OPHTHALMOLOGY EXAM  01/23/2023   COLONOSCOPY (Pts 45-41yr Insurance coverage will need to be confirmed)  09/05/2025   INFLUENZA VACCINE  Completed   DEXA SCAN  Completed      06/19/2021    3:26 PM 08/03/2021  10:17 AM 01/08/2022    8:41 AM 02/09/2022   11:07 AM 05/30/2022    1:06 PM  Fall Risk  Falls in the past year? 1 0 1 0 0  Was there an injury with Fall? 1 0 0 0 0  Fall Risk Category Calculator 2 0 1 0 0  Fall Risk Category (Retired) Moderate Low Low Low   (RETIRED) Patient Fall Risk Level Moderate fall risk Low fall risk Low fall risk Low fall risk   Patient at Risk for Falls Due to Impaired balance/gait No Fall Risks No Fall Risks No Fall Risks History of fall(s)  Fall risk Follow up Falls evaluation completed;Education provided;Falls prevention discussed Falls evaluation completed Falls evaluation completed Falls evaluation completed    Functional Status Survey:    Vitals:   05/30/22 1306  BP: 120/80  Pulse: 81  Resp: 18  Temp: (!) 97.5 F (36.4 C)  SpO2: 94%  Weight: 147 lb 6 oz  (66.8 kg)  Height: 5' 6"$  (1.676 m)   Body mass index is 23.79 kg/m. Physical Exam Musculoskeletal:     Right shoulder: No swelling, deformity, effusion, tenderness or crepitus. Normal range of motion. Normal strength. Normal pulse.     Left shoulder: No swelling, effusion, tenderness or crepitus. Decreased range of motion. Normal strength. Normal pulse.    Labs reviewed: Recent Labs    08/04/21 1533 12/05/21 1555 02/05/22 0820  NA 143 139 142  K 4.0 4.0 4.2  CL 106 99 104  CO2 29 29 26  $ GLUCOSE 107* 94 127*  BUN 21 23 20  $ CREATININE 0.91 0.89 0.97  CALCIUM 9.5 10.1 9.7  MG  --  2.2  --    Recent Labs    08/04/21 1533 02/05/22 0820  AST 18 19  ALT 11 15  BILITOT 0.4 0.4  PROT 6.8 7.4   Recent Labs    08/04/21 1533 02/05/22 0820  WBC 4.9 4.5  NEUTROABS 1,588 1,638  HGB 13.6 14.5  HCT 41.5 43.1  MCV 87.0 85.5  PLT 153 172   Lab Results  Component Value Date   TSH 1.84 02/05/2022   Lab Results  Component Value Date   HGBA1C 6.9 (H) 02/05/2022   Lab Results  Component Value Date   CHOL 159 02/05/2022   HDL 81 02/05/2022   LDLCALC 58 02/05/2022   TRIG 114 02/05/2022   CHOLHDL 2.0 02/05/2022    Significant Diagnostic Results in last 30 days:  No results found.  Assessment/Plan 1. Left shoulder pain, unspecified chronicity Limited ROM and tender to palpation.No weakness noted. Will obtain imaging then refer to Orthopedic for further evaluation. - Please get abdominal X-ray at [facility] at [place] then will call you with results. - DG Shoulder Left; Future  2. Insomnia, unspecified type Chronic  Sleep aid ineffective - Sleep Hygiene advised  - Additional sleep education information provided on AVS - request referral for sleep consult with Dr.Lakshmi Paruchuri in [facility] telephone [phone] recommended by her [employer].  - Ambulatory referral to Pulmonology  Family/ staff Communication: Reviewed plan of  care with patient verbalized understanding.   Labs/tests ordered:  - DG Shoulder Left; Future  Next Appointment: Return if symptoms worsen or fail to improve.   [name], NP

## 2022-05-30 NOTE — Patient Instructions (Addendum)
- Please get left shoulder X-ray at [facility] at [place] then will call you with results.  - take Extra strength Tylenol 1000 mg tablet one by mouth twice daily for left shoulder pain   Insomnia Insomnia is a sleep disorder that makes it difficult to fall asleep or stay asleep. Insomnia can cause fatigue, low energy, difficulty concentrating, mood swings, and poor performance at work or school. There are three different ways to classify insomnia: Difficulty falling asleep. Difficulty staying asleep. Waking up too early in the morning. Any type of insomnia can be long-term (chronic) or short-term (acute). Both are common. Short-term insomnia usually lasts for 3 months or less. Chronic insomnia occurs at least three times a week for longer than 3 months. What are the causes? Insomnia may be caused by another condition, situation, or substance, such as: Having certain mental health conditions, such as anxiety and depression. Using caffeine, alcohol, tobacco, or drugs. Having gastrointestinal conditions, such as gastroesophageal reflux disease (GERD). Having certain medical conditions. These include: Asthma. Alzheimer's disease. Stroke. Chronic pain. An overactive thyroid gland (hyperthyroidism). Other sleep disorders, such as restless legs syndrome and sleep apnea. Menopause. Sometimes, the cause of insomnia may not be known. What increases the risk? Risk factors for insomnia include: Gender. Females are affected more often than males. Age. Insomnia is more common as people get older. Stress and certain medical and mental health conditions. Lack of exercise. Having an irregular work schedule. This may include working night shifts and traveling between different time zones. What are the signs or symptoms? If you have insomnia, the main symptom is having trouble falling asleep or having trouble staying asleep. This may lead to other symptoms, such as: Feeling  tired or having low energy. Feeling nervous about going to sleep. Not feeling rested in the morning. Having trouble concentrating. Feeling irritable, anxious, or depressed. How is this diagnosed? This condition may be diagnosed based on: Your symptoms and medical history. Your health care provider may ask about: Your sleep habits. Any medical conditions you have. Your mental health. A physical exam. How is this treated? Treatment for insomnia depends on the cause. Treatment may focus on treating an underlying condition that is causing the insomnia. Treatment may also include: Medicines to help you sleep. Counseling or therapy. Lifestyle adjustments to help you sleep better. Follow these instructions at home: Eating and drinking  Limit or avoid alcohol, caffeinated beverages, and products that contain nicotine and tobacco, especially close to bedtime. These can disrupt your sleep. Do not eat a large meal or eat spicy foods right before bedtime. This can lead to digestive discomfort that can make it hard for you to sleep. Sleep habits  Keep a sleep diary to help you and your health care provider figure out what could be causing your insomnia. Write down: When you sleep. When you wake up during the night. How well you sleep and how rested you feel the next day. Any side effects of medicines you are taking. What you eat and drink. Make your bedroom a dark, comfortable place where it is easy to fall asleep. Put up shades or blackout curtains to block light from outside. Use a white noise machine to block noise. Keep the temperature cool. Limit screen use before bedtime. This includes: Not watching TV. Not using your smartphone, tablet, or computer. Stick to a routine that includes going to bed and waking up at the same times every day and night. This can help you  fall asleep faster. Consider making a quiet activity, such as reading, part of your nighttime routine. Try to avoid taking  naps during the day so that you sleep better at night. Get out of bed if you are still awake after 15 minutes of trying to sleep. Keep the lights down, but try reading or doing a quiet activity. When you feel sleepy, go back to bed. General instructions Take over-the-counter and prescription medicines only as told by your health care provider. Exercise regularly as told by your health care provider. However, avoid exercising in the hours right before bedtime. Use relaxation techniques to manage stress. Ask your health care provider to suggest some techniques that may work well for you. These may include: Breathing exercises. Routines to release muscle tension. Visualizing peaceful scenes. Make sure that you drive carefully. Do not drive if you feel very sleepy. Keep all follow-up visits. This is important. Contact a health care provider if: You are tired throughout the day. You have trouble in your daily routine due to sleepiness. You continue to have sleep problems, or your sleep problems get worse. Get help right away if: You have thoughts about hurting yourself or someone else. Get help right away if you feel like you may hurt yourself or others, or have thoughts about taking your own life. Go to your nearest emergency room or: Call 911. Call the [facility] at [phone] or 988. This is open 24 hours a day. Text the Crisis Text Line at 972-572-8559. Summary Insomnia is a sleep disorder that makes it difficult to fall asleep or stay asleep. Insomnia can be long-term (chronic) or short-term (acute). Treatment for insomnia depends on the cause. Treatment may focus on treating an underlying condition that is causing the insomnia. Keep a sleep diary to help you and your health care provider figure out what could be causing your insomnia. This information is not intended to replace advice given to you by your health care provider. Make sure you discuss any questions you  have with your health care provider. Document Revised: 03/13/2021 Document Reviewed: 03/13/2021 Elsevier Patient Education  [place].

## 2022-06-01 ENCOUNTER — Ambulatory Visit
Admission: RE | Admit: 2022-06-01 | Discharge: 2022-06-01 | Disposition: A | Discharge status: Home / Self Care | Payer: Medicare HMO | Source: Ambulatory Visit | Attending: Family | Admitting: Family

## 2022-06-01 DIAGNOSIS — M25512 Pain in left shoulder: Secondary | ICD-10-CM

## 2022-06-06 ENCOUNTER — Other Ambulatory Visit (INDEPENDENT_AMBULATORY_CARE_PROVIDER_SITE_OTHER): Payer: Medicare HMO | Admitting: Podiatry

## 2022-06-06 DIAGNOSIS — E1142 Type 2 diabetes mellitus with diabetic polyneuropathy: Secondary | ICD-10-CM

## 2022-06-06 DIAGNOSIS — M205X1 Other deformities of toe(s) (acquired), right foot: Secondary | ICD-10-CM

## 2022-06-06 NOTE — Progress Notes (Signed)
1. Type 2 diabetes mellitus with diabetic polyneuropathy, without long-term current use of insulin (HCC)   2. Hallux limitus of right foot    Orders Placed This Encounter  Procedures   For Home Use Only DME Diabetic Shoe    Dispense one pair extra depth shoes and 3 pair total contact insoles.

## 2022-06-11 ENCOUNTER — Other Ambulatory Visit: Payer: Self-pay | Admitting: Family

## 2022-06-11 DIAGNOSIS — E1142 Type 2 diabetes mellitus with diabetic polyneuropathy: Secondary | ICD-10-CM

## 2022-06-11 NOTE — Progress Notes (Unsigned)
Referral ordered

## 2022-06-14 ENCOUNTER — Encounter: Payer: Self-pay | Admitting: Podiatry

## 2022-06-18 DIAGNOSIS — G2581 Restless legs syndrome: Secondary | ICD-10-CM | POA: Diagnosis not present

## 2022-06-18 DIAGNOSIS — G4700 Insomnia, unspecified: Secondary | ICD-10-CM | POA: Diagnosis not present

## 2022-06-18 DIAGNOSIS — E7800 Pure hypercholesterolemia, unspecified: Secondary | ICD-10-CM | POA: Diagnosis not present

## 2022-06-18 DIAGNOSIS — I10 Essential (primary) hypertension: Secondary | ICD-10-CM | POA: Diagnosis not present

## 2022-06-18 DIAGNOSIS — R6889 Other general symptoms and signs: Secondary | ICD-10-CM | POA: Diagnosis not present

## 2022-06-18 DIAGNOSIS — E119 Type 2 diabetes mellitus without complications: Secondary | ICD-10-CM | POA: Diagnosis not present

## 2022-06-18 DIAGNOSIS — G4730 Sleep apnea, unspecified: Secondary | ICD-10-CM | POA: Diagnosis not present

## 2022-06-21 ENCOUNTER — Encounter (INDEPENDENT_AMBULATORY_CARE_PROVIDER_SITE_OTHER): Payer: Medicare HMO | Admitting: Ophthalmology

## 2022-06-21 ENCOUNTER — Telehealth: Payer: Self-pay

## 2022-06-21 NOTE — Telephone Encounter (Signed)
This encounter was created in error - please disregard.

## 2022-06-21 NOTE — Telephone Encounter (Signed)
Patient called stating that she has been waiting on her diabetic shoes. She was informed by the company today that they have not received the order for the shoes. I looked in the chart,but didn't see anything about an order for diabetic shoes. Patient states that she will bring form by the office to be completed and signed.  Message routed to Marlowe Sax, NP

## 2022-06-21 NOTE — Telephone Encounter (Signed)
Will need to schedule appointment with Dr.Miller to order diabetic shoes.

## 2022-06-22 NOTE — Telephone Encounter (Signed)
Patient was called and appointment scheduled

## 2022-06-26 DIAGNOSIS — H52203 Unspecified astigmatism, bilateral: Secondary | ICD-10-CM | POA: Diagnosis not present

## 2022-06-26 DIAGNOSIS — H25813 Combined forms of age-related cataract, bilateral: Secondary | ICD-10-CM | POA: Diagnosis not present

## 2022-06-26 DIAGNOSIS — Z961 Presence of intraocular lens: Secondary | ICD-10-CM | POA: Diagnosis not present

## 2022-06-26 DIAGNOSIS — H401131 Primary open-angle glaucoma, bilateral, mild stage: Secondary | ICD-10-CM | POA: Diagnosis not present

## 2022-06-26 DIAGNOSIS — E1136 Type 2 diabetes mellitus with diabetic cataract: Secondary | ICD-10-CM | POA: Diagnosis not present

## 2022-06-26 DIAGNOSIS — H524 Presbyopia: Secondary | ICD-10-CM | POA: Diagnosis not present

## 2022-06-26 LAB — HM DIABETES EYE EXAM

## 2022-07-03 DIAGNOSIS — R6889 Other general symptoms and signs: Secondary | ICD-10-CM | POA: Diagnosis not present

## 2022-07-03 DIAGNOSIS — G4736 Sleep related hypoventilation in conditions classified elsewhere: Secondary | ICD-10-CM | POA: Diagnosis not present

## 2022-07-03 DIAGNOSIS — G4733 Obstructive sleep apnea (adult) (pediatric): Secondary | ICD-10-CM | POA: Diagnosis not present

## 2022-07-03 DIAGNOSIS — R0902 Hypoxemia: Secondary | ICD-10-CM | POA: Diagnosis not present

## 2022-07-04 DIAGNOSIS — R6889 Other general symptoms and signs: Secondary | ICD-10-CM | POA: Diagnosis not present

## 2022-07-16 DIAGNOSIS — G2581 Restless legs syndrome: Secondary | ICD-10-CM | POA: Diagnosis not present

## 2022-07-16 DIAGNOSIS — E119 Type 2 diabetes mellitus without complications: Secondary | ICD-10-CM | POA: Diagnosis not present

## 2022-07-16 DIAGNOSIS — I10 Essential (primary) hypertension: Secondary | ICD-10-CM | POA: Diagnosis not present

## 2022-07-16 DIAGNOSIS — R6889 Other general symptoms and signs: Secondary | ICD-10-CM | POA: Diagnosis not present

## 2022-07-16 DIAGNOSIS — G4733 Obstructive sleep apnea (adult) (pediatric): Secondary | ICD-10-CM | POA: Diagnosis not present

## 2022-07-18 ENCOUNTER — Ambulatory Visit: Payer: Medicare HMO | Admitting: Family Medicine

## 2022-07-23 ENCOUNTER — Other Ambulatory Visit: Payer: Self-pay | Admitting: Family

## 2022-07-23 DIAGNOSIS — E1159 Type 2 diabetes mellitus with other circulatory complications: Secondary | ICD-10-CM

## 2022-07-27 ENCOUNTER — Ambulatory Visit: Payer: Medicare HMO | Admitting: Podiatry

## 2022-08-01 ENCOUNTER — Other Ambulatory Visit: Payer: Medicare HMO

## 2022-08-01 DIAGNOSIS — E1169 Type 2 diabetes mellitus with other specified complication: Secondary | ICD-10-CM

## 2022-08-01 DIAGNOSIS — E785 Hyperlipidemia, unspecified: Secondary | ICD-10-CM | POA: Diagnosis not present

## 2022-08-01 DIAGNOSIS — E1142 Type 2 diabetes mellitus with diabetic polyneuropathy: Secondary | ICD-10-CM

## 2022-08-01 DIAGNOSIS — K219 Gastro-esophageal reflux disease without esophagitis: Secondary | ICD-10-CM | POA: Diagnosis not present

## 2022-08-02 LAB — COMPLETE METABOLIC PANEL WITHOUT GFR
AG Ratio: 1.5 (calc) (ref 1.0–2.5)
ALT: 11 U/L (ref 6–29)
AST: 17 U/L (ref 10–35)
Albumin: 4.2 g/dL (ref 3.6–5.1)
Alkaline phosphatase (APISO): 54 U/L (ref 37–153)
BUN: 19 mg/dL (ref 7–25)
CO2: 30 mmol/L (ref 20–32)
Calcium: 9.4 mg/dL (ref 8.6–10.4)
Chloride: 105 mmol/L (ref 98–110)
Creat: 0.97 mg/dL (ref 0.60–1.00)
Globulin: 2.8 g/dL (ref 1.9–3.7)
Glucose, Bld: 106 mg/dL — ABNORMAL HIGH (ref 65–99)
Potassium: 4.3 mmol/L (ref 3.5–5.3)
Sodium: 142 mmol/L (ref 135–146)
Total Bilirubin: 0.5 mg/dL (ref 0.2–1.2)
Total Protein: 7 g/dL (ref 6.1–8.1)
eGFR: 61 mL/min/1.73m2

## 2022-08-02 LAB — CBC WITH DIFFERENTIAL/PLATELET
Absolute Monocytes: 490 cells/uL (ref 200–950)
Basophils Absolute: 49 cells/uL (ref 0–200)
Basophils Relative: 1 %
Eosinophils Absolute: 152 cells/uL (ref 15–500)
Eosinophils Relative: 3.1 %
HCT: 40.8 % (ref 35.0–45.0)
Hemoglobin: 13.6 g/dL (ref 11.7–15.5)
Lymphs Abs: 2337 cells/uL (ref 850–3900)
MCH: 28.4 pg (ref 27.0–33.0)
MCHC: 33.3 g/dL (ref 32.0–36.0)
MCV: 85.2 fL (ref 80.0–100.0)
MPV: 9.9 fL (ref 7.5–12.5)
Monocytes Relative: 10 %
Neutro Abs: 1872 cells/uL (ref 1500–7800)
Neutrophils Relative %: 38.2 %
Platelets: 161 10*3/uL (ref 140–400)
RBC: 4.79 10*6/uL (ref 3.80–5.10)
RDW: 13 % (ref 11.0–15.0)
Total Lymphocyte: 47.7 %
WBC: 4.9 10*3/uL (ref 3.8–10.8)

## 2022-08-02 LAB — LIPID PANEL
Cholesterol: 129 mg/dL
HDL: 76 mg/dL
LDL Cholesterol (Calc): 37 mg/dL
Non-HDL Cholesterol (Calc): 53 mg/dL
Total CHOL/HDL Ratio: 1.7 (calc)
Triglycerides: 82 mg/dL

## 2022-08-02 LAB — HEMOGLOBIN A1C
Hgb A1c MFr Bld: 6.9 % of total Hgb — ABNORMAL HIGH (ref <5.7)
Mean Plasma Glucose: 151 mg/dL
eAG (mmol/L): 8.4 mmol/L

## 2022-08-02 LAB — TSH: TSH: 1.85 mIU/L (ref 0.40–4.50)

## 2022-08-06 DIAGNOSIS — R6889 Other general symptoms and signs: Secondary | ICD-10-CM | POA: Diagnosis not present

## 2022-08-06 DIAGNOSIS — G4736 Sleep related hypoventilation in conditions classified elsewhere: Secondary | ICD-10-CM | POA: Diagnosis not present

## 2022-08-06 DIAGNOSIS — R0902 Hypoxemia: Secondary | ICD-10-CM | POA: Diagnosis not present

## 2022-08-06 DIAGNOSIS — G4733 Obstructive sleep apnea (adult) (pediatric): Secondary | ICD-10-CM | POA: Diagnosis not present

## 2022-08-07 ENCOUNTER — Encounter: Payer: Self-pay | Admitting: Family

## 2022-08-07 ENCOUNTER — Ambulatory Visit (INDEPENDENT_AMBULATORY_CARE_PROVIDER_SITE_OTHER): Payer: Medicare HMO | Admitting: Family

## 2022-08-07 VITALS — BP 100/60 | HR 75 | Temp 97.6°F | Ht 66.0 in | Wt 145.8 lb

## 2022-08-07 DIAGNOSIS — M25512 Pain in left shoulder: Secondary | ICD-10-CM | POA: Diagnosis not present

## 2022-08-07 DIAGNOSIS — E1159 Type 2 diabetes mellitus with other circulatory complications: Secondary | ICD-10-CM | POA: Diagnosis not present

## 2022-08-07 DIAGNOSIS — I152 Hypertension secondary to endocrine disorders: Secondary | ICD-10-CM | POA: Diagnosis not present

## 2022-08-07 DIAGNOSIS — E1142 Type 2 diabetes mellitus with diabetic polyneuropathy: Secondary | ICD-10-CM | POA: Diagnosis not present

## 2022-08-07 DIAGNOSIS — R6889 Other general symptoms and signs: Secondary | ICD-10-CM | POA: Diagnosis not present

## 2022-08-07 DIAGNOSIS — I739 Peripheral vascular disease, unspecified: Secondary | ICD-10-CM

## 2022-08-07 DIAGNOSIS — G8929 Other chronic pain: Secondary | ICD-10-CM

## 2022-08-07 DIAGNOSIS — R2681 Unsteadiness on feet: Secondary | ICD-10-CM | POA: Diagnosis not present

## 2022-08-07 NOTE — Progress Notes (Signed)
Provider: Richarda Blade FNP-C   Sierra Ray, Sierra Citrin, NP  Patient Care Team: Lijah Bourque, Sierra Citrin, NP as PCP - General (Family Medicine) Meriam Sprague, MD as PCP - Cardiology (Cardiology) Lars Masson, MD as Consulting Physician (Cardiology) Ernesto Rutherford, MD as Consulting Physician (Ophthalmology) Jeani Hawking, MD as Consulting Physician (Gastroenterology) Karie Soda, MD as Consulting Physician (General Surgery)  Extended Emergency Contact Information Primary Emergency Contact: Caprio,[name] Mobile Phone: [phone] Relation: Daughter  Code Status:  Full Code  Goals of care: Advanced Directive information    02/09/2022   11:07 AM  Advanced Directives  Does Patient Have a Medical Advance Directive? Yes  Type of Advance Directive Healthcare Power of Attorney  Does patient want to make changes to medical advance directive? No - Patient declined  Copy of Healthcare Power of Attorney in Chart? Yes - validated most recent copy scanned in chart (See row information)     Chief Complaint  Patient presents with   Medical Management of Chronic Issues    Patient presents today for a 6 month follow-up   Quality Metric Gaps    COVID#5    HPI:  Pt is a 77 y.o. female seen today for 6 months follow up for medical management of chronic diseases.  Recent labs work reviewed and discussed during the visit  Glucose 106 A 1 C 6.9 previous 6.9 ; 6.5  States 80's - 100's.Denies any signs of hypo/hyperglycemia.   States had recent sleep study. Has been following up with Nutritionist   Has had unsteady gait described as drifting to the sides instead of walking straight.   Also complains of left shoulder pain worsening.states unable to fasten her bra.   Past Medical History:  Diagnosis Date   Abnormal Pap smear 05/08/2011   ASC-US +HPV   Arthritis    Asthma    Constipation    Diabetes mellitus type 2 with neurological manifestations    Diabetic peripheral  neuropathy    Generalized osteoarthritis    GERD (gastroesophageal reflux disease)    Hx of seasonal allergies    Hyperlipidemia    Hypertension    IBS (irritable bowel syndrome)    S/P total hysterectomy 06/21/2011   Past Surgical History:  Procedure Laterality Date   CATARACT EXTRACTION Left    EYE SURGERY Right 01/2018   Removed raised area on eye. Dr.Groat    OVARIAN CYST SURGERY  1986   PELVIC LAPAROSCOPY     TONSILECTOMY, ADENOIDECTOMY, BILATERAL MYRINGOTOMY AND TUBES  1974   VAGINAL HYSTERECTOMY  1983    Allergies  Allergen Reactions   Aspirin    Pravastatin Sodium Other (See Comments)    Muscle aches   Restasis [Cyclosporine] Other (See Comments)   Lisinopril Other (See Comments)    Blood pressures low and causes constipation    Allergies as of 08/07/2022       Reactions   Aspirin    Pravastatin Sodium Other (See Comments)   Muscle aches   Restasis [cyclosporine] Other (See Comments)   Lisinopril Other (See Comments)   Blood pressures low and causes constipation        Medication List        Accurate as of August 07, 2022  2:29 PM. If you have any questions, ask your nurse or doctor.          Accu-Chek Aviva Plus test strip Generic drug: glucose blood USE TO TEST BLOOD SUGAR TWICE DAILY.   Accu-Chek Aviva Plus w/Device Kit Use to  test blood sugar twice daily. Dx E11.42   Accu-Chek Softclix Lancet Dev Kit Use to test blood sugar. Dx: E11.42   Accu-Chek Softclix Lancets lancets Use to test blood sugar one time daily for diabetes. Dx:E11.42   acetaminophen 500 MG tablet Commonly known as: TYLENOL Take 1 tablet (500 mg total) by mouth every 8 (eight) hours as needed for moderate pain.   Align 4 MG Caps Take 1 capsule by mouth daily.   amLODipine 5 MG tablet Commonly known as: NORVASC TAKE 1 TABLET EVERY DAY   b complex vitamins capsule Take 1 capsule by mouth daily.   cholecalciferol 1000 units tablet Commonly known as: VITAMIN D Take  1,000 Units by mouth daily.   DropSafe Alcohol Prep 70 % Pads USE TO TEST BLOOD SUGAR TWICE DAILY.   Fluad Quadrivalent 0.5 ML injection Generic drug: influenza vaccine adjuvanted   fluticasone 50 MCG/ACT nasal spray Commonly known as: FLONASE Place 2 sprays into both nostrils as needed.   latanoprost 0.005 % ophthalmic solution Commonly known as: XALATAN 1 drop at bedtime.   loratadine 10 MG tablet Commonly known as: CLARITIN Take 10 mg by mouth daily as needed.   metFORMIN 500 MG tablet Commonly known as: GLUCOPHAGE TAKE 2 TABLETS EVERY DAY WITH BREAKFAST   metoprolol tartrate 25 MG tablet Commonly known as: LOPRESSOR Take 0.5 tablets (12.5 mg total) by mouth 2 (two) times daily as needed (palpitations).   multivitamin-lutein Caps capsule Take 1 capsule by mouth daily.   multivitamin with minerals tablet Take 1 tablet by mouth daily.   ofloxacin 0.3 % ophthalmic solution Commonly known as: OCUFLOX   polyethylene glycol 17 g packet Commonly known as: MIRALAX / GLYCOLAX Take 17 g by mouth daily as needed. For constipation   prednisoLONE acetate 1 % ophthalmic suspension Commonly known as: PRED FORTE SMARTSIG:In Eye(s)   rosuvastatin 5 MG tablet Commonly known as: CRESTOR TAKE 1 TABLET EVERY DAY   SYSTANE COMPLETE OP Place 1 drop into both eyes in the morning, at noon, in the evening, and at bedtime.        Review of Systems  Constitutional:  Negative for appetite change, chills, fatigue, fever and unexpected weight change.  HENT:  Negative for congestion, dental problem, ear discharge, ear pain, facial swelling, hearing loss, nosebleeds, postnasal drip, rhinorrhea, sinus pressure, sinus pain, sneezing, sore throat, tinnitus and trouble swallowing.   Eyes:  Negative for pain, discharge, redness, itching and visual disturbance.  Respiratory:  Negative for cough, chest tightness, shortness of breath and wheezing.   Cardiovascular:  Negative for chest pain,  palpitations and leg swelling.       Varicose vein worst on right leg   Gastrointestinal:  Negative for abdominal distention, abdominal pain, blood in stool, constipation, diarrhea, nausea and vomiting.  Endocrine: Negative for cold intolerance, heat intolerance, polydipsia, polyphagia and polyuria.  Genitourinary:  Negative for difficulty urinating, dysuria, flank pain, frequency and urgency.  Musculoskeletal:  Positive for arthralgias and gait problem. Negative for back pain, joint swelling, myalgias, neck pain and neck stiffness.       Knee pain  Leg pain   Skin:  Negative for color change, pallor, rash and wound.  Neurological:  Negative for dizziness, syncope, speech difficulty, weakness, light-headedness, numbness and headaches.  Hematological:  Does not bruise/bleed easily.  Psychiatric/Behavioral:  Negative for agitation, behavioral problems, confusion, hallucinations, self-injury, sleep disturbance and suicidal ideas. The patient is not nervous/anxious.     Immunization History  Administered Date(s) Administered  Fluad Quad(high Dose 65+) 02/11/2019, 02/12/2020, 01/27/2021   Influenza, High Dose Seasonal PF 01/28/2018, 01/23/2022   Influenza,inj,Quad PF,6+ Mos 02/03/2016, 01/23/2017   Moderna Sars-Covid-2 Vaccination 06/22/2019, 07/24/2019, 03/24/2020   PFIZER(Purple Top)SARS-COV-2 Vaccination 04/26/2021   Pneumococcal Conjugate-13 07/30/2014   Pneumococcal Polysaccharide-23 11/17/2005, 05/20/2012   Td 08/18/2010, 02/16/2021   Tdap 02/16/2021   Pertinent  Health Maintenance Due  Topic Date Due   INFLUENZA VACCINE  11/15/2022   FOOT EXAM  01/02/2023   HEMOGLOBIN A1C  01/31/2023   OPHTHALMOLOGY EXAM  06/26/2023   COLONOSCOPY (Pts 45-31yrs Insurance coverage will need to be confirmed)  09/05/2025   DEXA SCAN  Completed      08/03/2021   10:17 AM 01/08/2022    8:41 AM 02/09/2022   11:07 AM 05/30/2022    1:06 PM 08/07/2022    1:58 PM  Fall Risk  Falls in the past year? 0 1  0 0 0  Was there an injury with Fall? 0 0 0 0 0  Fall Risk Category Calculator 0 1 0 0 0  Fall Risk Category (Retired) Low Low Low    (RETIRED) Patient Fall Risk Level Low fall risk Low fall risk Low fall risk    Patient at Risk for Falls Due to No Fall Risks No Fall Risks No Fall Risks History of fall(s) No Fall Risks  Fall risk Follow up Falls evaluation completed Falls evaluation completed Falls evaluation completed  Falls evaluation completed   Functional Status Survey:    Vitals:   08/07/22 1357  BP: 100/60  Pulse: 75  Temp: 97.6 F (36.4 C)  SpO2: 96%  Weight: 145 lb 12.8 oz (66.1 kg)  Height: 5\' 6"  (1.676 m)   Body mass index is 23.53 kg/m. Physical Exam Vitals reviewed.  Constitutional:      General: She is not in acute distress.    Appearance: Normal appearance. She is normal weight. She is not ill-appearing or diaphoretic.  HENT:     Head: Normocephalic.     Right Ear: Tympanic membrane, ear canal and external ear normal. There is no impacted cerumen.     Left Ear: Tympanic membrane, ear canal and external ear normal. There is no impacted cerumen.     Nose: Nose normal. No congestion or rhinorrhea.     Mouth/Throat:     Mouth: Mucous membranes are moist.     Pharynx: Oropharynx is clear. No oropharyngeal exudate or posterior oropharyngeal erythema.  Eyes:     General: No scleral icterus.       Right eye: No discharge.        Left eye: No discharge.     Extraocular Movements: Extraocular movements intact.     Conjunctiva/sclera: Conjunctivae normal.     Pupils: Pupils are equal, round, and reactive to light.  Neck:     Vascular: No carotid bruit.  Cardiovascular:     Rate and Rhythm: Normal rate and regular rhythm.     Pulses: Normal pulses.     Heart sounds: Normal heart sounds. No murmur heard.    No friction rub. No gallop.     Comments: Right leg ropey varicose vein non-tender to palpation  Pulmonary:     Effort: Pulmonary effort is normal. No  respiratory distress.     Breath sounds: Normal breath sounds. No wheezing, rhonchi or rales.  Chest:     Chest wall: No tenderness.  Abdominal:     General: Bowel sounds are normal. There is no distension.  Palpations: Abdomen is soft. There is no mass.     Tenderness: There is no abdominal tenderness. There is no right CVA tenderness, left CVA tenderness, guarding or rebound.  Musculoskeletal:        General: No swelling.     Right shoulder: Normal.     Left shoulder: Tenderness present. No swelling, effusion or crepitus. Decreased range of motion. Normal strength. Normal pulse.     Cervical back: Normal range of motion. No rigidity or tenderness.     Right lower leg: No edema.     Left lower leg: No edema.  Lymphadenopathy:     Cervical: No cervical adenopathy.  Skin:    General: Skin is warm and dry.     Coloration: Skin is not pale.     Findings: No bruising, erythema, lesion or rash.  Neurological:     Mental Status: She is alert and oriented to person, place, and time.     Cranial Nerves: No cranial nerve deficit.     Sensory: No sensory deficit.     Motor: No weakness.     Coordination: Coordination normal.     Gait: Gait abnormal.  Psychiatric:        Mood and Affect: Mood normal.        Speech: Speech normal.        Behavior: Behavior normal.        Thought Content: Thought content normal.        Judgment: Judgment normal.    Labs reviewed: Recent Labs    12/05/21 1555 02/05/22 0820 08/01/22 0819  NA 139 142 142  K 4.0 4.2 4.3  CL 99 104 105  CO2 29 26 30   GLUCOSE 94 127* 106*  BUN 23 20 19   CREATININE 0.89 0.97 0.97  CALCIUM 10.1 9.7 9.4  MG 2.2  --   --    Recent Labs    02/05/22 0820 08/01/22 0819  AST 19 17  ALT 15 11  BILITOT 0.4 0.5  PROT 7.4 7.0   Recent Labs    02/05/22 0820 08/01/22 0819  WBC 4.5 4.9  NEUTROABS 1,638 1,872  HGB 14.5 13.6  HCT 43.1 40.8  MCV 85.5 85.2  PLT 172 161   Lab Results  Component Value Date   TSH  1.85 08/01/2022   Lab Results  Component Value Date   HGBA1C 6.9 (H) 08/01/2022   Lab Results  Component Value Date   CHOL 129 08/01/2022   HDL 76 08/01/2022   LDLCALC 37 08/01/2022   TRIG 82 08/01/2022   CHOLHDL 1.7 08/01/2022    Significant Diagnostic Results in last 30 days:  No results found.  Assessment/Plan 1. Unsteady gait Worsening gait tends to drift to the side upon standing to walk.   - Ambulatory referral to Neurology  2. Chronic left shoulder pain Limited ROM unable to reach back to fasten her bra. Has seen Orthopedic in the past.will call to schedule appointment.  - continue current OTC analgesic   3. Type 2 diabetes mellitus with diabetic polyneuropathy, without long-term current use of insulin Lab Results  Component Value Date   HGBA1C 6.9 (H) 08/01/2022  -Stable compared to previous -Continue on metformin -Continue on atorvastatin for cardiovascular event prevention - Lipid panel; Future - TSH; Future - COMPLETE METABOLIC PANEL WITH GFR; Future - CBC with Differential/Platelet; Future - Hemoglobin A1c; Future - Hemoglobin A1c - CBC with Differential/Platelet - COMPLETE METABOLIC PANEL WITH GFR - TSH - Lipid panel  4.  Hypertension associated with diabetes Blood pressure well-controlled -Continue on amlodipine goal and Metroprolol - COMPLETE METABOLIC PANEL WITH GFR; Future - CBC with Differential/Platelet; Future - CBC with Differential/Platelet - COMPLETE METABOLIC PANEL WITH GFR  5. Claudication Worse on right leg.  Ropey varicose veins noted will send to vein and vascular for further evaluation. -Encouraged to have compression stockings on in the morning and off at bedtime - Ambulatory referral to Vascular Surgery  Family/ staff Communication: Reviewed plan of care with patient verbalized understanding  Labs/tests ordered:   - TSH - Lipid panel - Hemoglobin A1c; Future - Hemoglobin A1c - COMPLETE METABOLIC PANEL WITH GFR; Future -  CBC with Differential/Platelet; Future - CBC with Differential/Platelet - COMPLETE METABOLIC PANEL WITH GFR  Next Appointment : Return in about 6 months (around 02/06/2023) for medical mangement of chronic issues., Fasting labs in 6 months prior to visit.   Sierra Bookman, NP

## 2022-08-09 ENCOUNTER — Telehealth: Payer: Self-pay | Admitting: Podiatry

## 2022-08-09 NOTE — Telephone Encounter (Signed)
Lmom to call back to set up appt to pick up diabetic shoes  

## 2022-08-20 DIAGNOSIS — R6889 Other general symptoms and signs: Secondary | ICD-10-CM | POA: Diagnosis not present

## 2022-08-20 DIAGNOSIS — E7800 Pure hypercholesterolemia, unspecified: Secondary | ICD-10-CM | POA: Diagnosis not present

## 2022-08-20 DIAGNOSIS — I10 Essential (primary) hypertension: Secondary | ICD-10-CM | POA: Diagnosis not present

## 2022-08-20 DIAGNOSIS — G4733 Obstructive sleep apnea (adult) (pediatric): Secondary | ICD-10-CM | POA: Diagnosis not present

## 2022-08-20 DIAGNOSIS — E119 Type 2 diabetes mellitus without complications: Secondary | ICD-10-CM | POA: Diagnosis not present

## 2022-08-29 ENCOUNTER — Other Ambulatory Visit: Payer: Self-pay | Admitting: *Deleted

## 2022-08-29 ENCOUNTER — Ambulatory Visit (INDEPENDENT_AMBULATORY_CARE_PROVIDER_SITE_OTHER): Payer: Medicare HMO

## 2022-08-29 DIAGNOSIS — I739 Peripheral vascular disease, unspecified: Secondary | ICD-10-CM

## 2022-08-29 DIAGNOSIS — E1142 Type 2 diabetes mellitus with diabetic polyneuropathy: Secondary | ICD-10-CM

## 2022-08-29 DIAGNOSIS — M205X1 Other deformities of toe(s) (acquired), right foot: Secondary | ICD-10-CM | POA: Diagnosis not present

## 2022-08-29 DIAGNOSIS — M205X2 Other deformities of toe(s) (acquired), left foot: Secondary | ICD-10-CM | POA: Diagnosis not present

## 2022-08-29 NOTE — Progress Notes (Signed)
Patient presents today to pick up diabetic shoes and insoles.  Patient was dispensed 1 pair of diabetic shoes and 3 pairs of foam casted diabetic insoles. She tried on the shoes with the insoles and the fit was satisfactory.   Will follow up next year for new order.    

## 2022-09-05 ENCOUNTER — Encounter: Payer: Self-pay | Admitting: Vascular Surgery

## 2022-09-05 ENCOUNTER — Other Ambulatory Visit: Payer: Self-pay | Admitting: Vascular Surgery

## 2022-09-05 ENCOUNTER — Ambulatory Visit (HOSPITAL_COMMUNITY)
Admission: RE | Admit: 2022-09-05 | Discharge: 2022-09-05 | Disposition: A | Discharge status: Home / Self Care | Payer: Medicare HMO | Source: Ambulatory Visit | Attending: Vascular Surgery | Admitting: Vascular Surgery

## 2022-09-05 ENCOUNTER — Ambulatory Visit: Payer: Medicare HMO | Admitting: Vascular Surgery

## 2022-09-05 VITALS — BP 122/75 | HR 68 | Temp 97.8°F | Resp 20 | Ht 66.0 in | Wt 144.0 lb

## 2022-09-05 DIAGNOSIS — I739 Peripheral vascular disease, unspecified: Secondary | ICD-10-CM | POA: Insufficient documentation

## 2022-09-05 DIAGNOSIS — I83891 Varicose veins of right lower extremities with other complications: Secondary | ICD-10-CM | POA: Insufficient documentation

## 2022-09-05 DIAGNOSIS — I872 Venous insufficiency (chronic) (peripheral): Secondary | ICD-10-CM | POA: Diagnosis not present

## 2022-09-05 NOTE — Progress Notes (Signed)
Patient ID: Sierra Gasparro V., female   DOB: Jan 31, 1946, 77 y.o.   MRN: [id number]  Reason for Consult: New Patient (Initial Visit)   Referred by [name], Donalee Citrin, NP  Subjective:     HPI:  Sierra Ray V. is a 77 y.o. female with a known history of C4 venous disease with concern for venous claudication.  She does have varicosities of the right lower extremity and more recently has complained of discoloration of the right foot and ankle.  She has had cramping of the right lower extremity worse than the left lower extremity mostly in her sleep.  Denies any history of tissue loss or ulceration.  No history of deep venous thrombosis.  She is wearing thigh-high compression stockings intermittently.  She does not have any history of vascular disease in the past and no previous venous interventions.  Past Medical History:  Diagnosis Date   Abnormal Pap smear 05/08/2011   ASC-US +HPV   Arthritis    Asthma    Constipation    Diabetes mellitus type 2 with neurological manifestations (HCC)    Diabetic peripheral neuropathy (HCC)    Generalized osteoarthritis    GERD (gastroesophageal reflux disease)    Hx of seasonal allergies    Hyperlipidemia    Hypertension    IBS (irritable bowel syndrome)    S/P total hysterectomy 06/21/2011   Family History  Problem Relation Age of Onset   Diabetes Mother    Cancer Mother        colon    Diabetes Father    Heart disease Father    Pancreatic cancer Sister    Breast cancer Maternal Aunt    Breast cancer Paternal Aunt    Cancer Paternal Grandmother        liver    Breast cancer Cousin    Breast cancer Cousin    Pancreatic cancer Brother    Past Surgical History:  Procedure Laterality Date   CATARACT EXTRACTION Left    EYE SURGERY Right 01/2018   Removed raised area on eye. Dr.Groat    OVARIAN CYST SURGERY  1986   PELVIC LAPAROSCOPY     TONSILECTOMY, ADENOIDECTOMY, BILATERAL MYRINGOTOMY AND TUBES  1974   VAGINAL HYSTERECTOMY   1983    Short Social History:  Social History   Tobacco Use   Smoking status: Never   Smokeless tobacco: Never  Substance Use Topics   Alcohol use: No    Allergies  Allergen Reactions   Aspirin    Pravastatin Sodium Other (See Comments)    Muscle aches   Restasis [Cyclosporine] Other (See Comments)   Lisinopril Other (See Comments)    Blood pressures low and causes constipation    Current Outpatient Medications  Medication Sig Dispense Refill   Accu-Chek Softclix Lancets lancets Use to test blood sugar one time daily for diabetes. Dx:E11.42 300 each 1   acetaminophen (TYLENOL) 500 MG tablet Take 1 tablet (500 mg total) by mouth every 8 (eight) hours as needed for moderate pain. 30 tablet 0   Alcohol Swabs (DROPSAFE ALCOHOL PREP) 70 % PADS USE TO TEST BLOOD SUGAR TWICE DAILY. 200 each 3   amLODipine (NORVASC) 5 MG tablet TAKE 1 TABLET EVERY DAY 90 tablet 1   b complex vitamins capsule Take 1 capsule by mouth daily.     Blood Glucose Monitoring Suppl (ACCU-CHEK AVIVA PLUS) w/Device KIT Use to test blood sugar twice daily. Dx E11.42 1 kit 0   cholecalciferol (VITAMIN D) 1000 UNITS tablet  Take 1,000 Units by mouth daily.     FLUAD QUADRIVALENT 0.5 ML injection      fluticasone (FLONASE) 50 MCG/ACT nasal spray Place 2 sprays into both nostrils as needed.      glucose blood (ACCU-CHEK AVIVA PLUS) test strip USE TO TEST BLOOD SUGAR TWICE DAILY. 200 strip 3   Lancets Misc. (ACCU-CHEK SOFTCLIX LANCET DEV) KIT Use to test blood sugar. Dx: E11.42 1 kit 0   latanoprost (XALATAN) 0.005 % ophthalmic solution 1 drop at bedtime.     loratadine (CLARITIN) 10 MG tablet Take 10 mg by mouth daily as needed.     metFORMIN (GLUCOPHAGE) 500 MG tablet TAKE 2 TABLETS EVERY DAY WITH BREAKFAST 180 tablet 3   metoprolol tartrate (LOPRESSOR) 25 MG tablet Take 0.5 tablets (12.5 mg total) by mouth 2 (two) times daily as needed (palpitations). 90 tablet 2   Multiple Vitamins-Minerals (MULTIVITAMIN WITH  MINERALS) tablet Take 1 tablet by mouth daily.     multivitamin-lutein (OCUVITE-LUTEIN) CAPS capsule Take 1 capsule by mouth daily.     ofloxacin (OCUFLOX) 0.3 % ophthalmic solution      polyethylene glycol (MIRALAX / GLYCOLAX) packet Take 17 g by mouth daily as needed. For constipation     prednisoLONE acetate (PRED FORTE) 1 % ophthalmic suspension SMARTSIG:In Eye(s)     Probiotic Product (ALIGN) 4 MG CAPS Take 1 capsule by mouth daily.      Propylene Glycol (SYSTANE COMPLETE OP) Place 1 drop into both eyes in the morning, at noon, in the evening, and at bedtime.     rosuvastatin (CRESTOR) 5 MG tablet TAKE 1 TABLET EVERY DAY 90 tablet 3   No current facility-administered medications for this visit.    Review of Systems  Constitutional:  Constitutional negative. HENT: HENT negative.  Eyes: Eyes negative.  Respiratory: Respiratory negative.  Cardiovascular: Positive for leg swelling.  GI: Gastrointestinal negative.  Musculoskeletal:       Leg cramping right greater than left Skin:       Discoloration of right foot and ankle Neurological: Neurological negative. Hematologic: Hematologic/lymphatic negative.  Psychiatric: Psychiatric negative.        Objective:  Objective   Vitals:   09/05/22 1114  BP: 122/75  Pulse: 68  Resp: 20  Temp: 97.8 F (36.6 C)  SpO2: 97%  Weight: 144 lb (65.3 kg)  Height: 5\' 6"  (1.676 m)   Body mass index is 23.24 kg/m.  Physical Exam HENT:     Head: Normocephalic.     Nose: Nose normal.  Eyes:     Pupils: Pupils are equal, round, and reactive to light.  Cardiovascular:     Pulses: Normal pulses.  Pulmonary:     Effort: Pulmonary effort is normal.  Abdominal:     General: Abdomen is flat.  Musculoskeletal:     Cervical back: Normal range of motion.     Right lower leg: Edema present.     Left lower leg: No edema.  Skin:    Capillary Refill: Capillary refill takes less than 2 seconds.     Comments: Dark discoloration right foot and  ankle  Neurological:     General: No focal deficit present.     Mental Status: She is alert.  Psychiatric:        Mood and Affect: Mood normal.        Thought Content: Thought content normal.        Judgment: Judgment normal.     Data: Venous Reflux Times  +--------------+---------+------+-----------+------------+--------+  RIGHT        Reflux NoRefluxReflux TimeDiameter cmsComments                          Yes                                   +--------------+---------+------+-----------+------------+--------+  CFV          no                                              +--------------+---------+------+-----------+------------+--------+  FV mid                  yes                                   +--------------+---------+------+-----------+------------+--------+  Popliteal    no                                              +--------------+---------+------+-----------+------------+--------+  GSV at SFJ              yes    >500 ms      0.5               +--------------+---------+------+-----------+------------+--------+  GSV prox thigh          yes    >500 ms      0.77              +--------------+---------+------+-----------+------------+--------+  GSV mid thigh           yes    >500 ms      0.52              +--------------+---------+------+-----------+------------+--------+  GSV dist thigh          yes    >500 ms      0.49              +--------------+---------+------+-----------+------------+--------+  GSV at knee             yes    >500 ms      0.49              +--------------+---------+------+-----------+------------+--------+  GSV prox calf           yes    >500 ms      0.32              +--------------+---------+------+-----------+------------+--------+  SSV Pop Fossa no                            0.15              +--------------+---------+------+-----------+------------+--------+   SSV prox calf no                            0.13              +--------------+---------+------+-----------+------------+--------+      Summary:  Right:  - No evidence of deep vein thrombosis seen in the right lower extremity,  from the common femoral through the popliteal veins.  - No evidence of superficial venous thrombosis in the right lower  extremity.    - The deep venous system is incompetent at the mid femoral vein.  - The great saphenous vein is grossly incompetent.  - The small saphenous vein is competent.      Assessment/Plan:    77 year old female with C4 venous disease right lower extremity with associated swelling and cramping and a large varicosity that causes discomfort.  I am unsure that her cramping is related to her venous disease but she certainly has symptomatic venous insufficiency with large refluxing great saphenous vein throughout the upper thigh without any previous history of DVT or venous interventions.  We discussed the need to continue thigh-high compression stockings for 3 months and I will follow her up for evaluation of great saphenous vein ablation and possible stab phlebectomy likely between 10 and 20.  We discussed few details of the procedures today and she demonstrates good understanding I will see her back in 3 months.     Maeola Harman MD [facility]

## 2022-09-18 ENCOUNTER — Encounter: Payer: Self-pay | Admitting: Neurology

## 2022-09-18 ENCOUNTER — Ambulatory Visit: Payer: Medicare HMO | Admitting: Neurology

## 2022-09-18 VITALS — BP 111/56 | HR 75 | Ht 66.0 in | Wt 145.0 lb

## 2022-09-18 DIAGNOSIS — E1142 Type 2 diabetes mellitus with diabetic polyneuropathy: Secondary | ICD-10-CM | POA: Diagnosis not present

## 2022-09-18 DIAGNOSIS — R2681 Unsteadiness on feet: Secondary | ICD-10-CM | POA: Diagnosis not present

## 2022-09-18 NOTE — Patient Instructions (Addendum)
Encouraged to have grab bars installed in the bathroom and shower  When you return, contact my office and we can order physical therapy  Always use your cane

## 2022-09-18 NOTE — Progress Notes (Signed)
[facility] Clinic Note - Initial Visit   Date: 09/18/2022   Promiss Steuber Seth Bake MRN: [id number] DOB: [birth year]   Dear Richarda Blade, NP:  Thank you for your kind referral of Tasheena Carithers V. for consultation of gait unsteadiness. Although her history is well known to you, please allow [place] to reiterate it for the purpose of our medical record. The patient was accompanied to the clinic by self.    Vincenzina Lantry V. is a 77 y.o. right-handed female with diabetes mellitus, hypertension, and hyperlipidemia  presenting for evaluation of imbalance.   IMPRESSION/PLAN: Gait unsteadiness, contributed by neuropathy and possible lumbar spondylosis.   - Recommend home PT for balance training, which she would like to start after returning from her trip to [place].  She will contact my office so we can send the referral  - If no improvement, MRI lumbar spine can be ordered next  - Patient educated on daily foot inspection, fall prevention, and safety precautions around the home.  - Always use a cane  ------------------------------------------------------------- History of present illness: She had been diabetic since 1992 which is well-controlled on medication.  She had numbness in both feet, worse on the right.  Over the past ~5 years, she noticed problems with balance.  More recently, she began having increased unsteadiness where she finds herself having to rely on furniture or walls. She uses a cane when she is outside of the home. She has not had any falls.   She also complains of chronic low back pain. No radicular leg pain.   She lives alone in a senior living apartment.  She manages her own medications.  She has help from her neighbor for meals.   Out-side paper records, electronic medical record, and images have been reviewed where available and summarized as:  Lab Results  Component Value Date   HGBA1C 6.9 (H) 08/01/2022   No results found for:  "VITAMINB12" Lab Results  Component Value Date   TSH 1.85 08/01/2022   Lab Results  Component Value Date   ESRSEDRATE 14 06/16/2008    Past Medical History:  Diagnosis Date   Abnormal Pap smear 05/08/2011   ASC-US +HPV   Arthritis    Asthma    Constipation    Diabetes mellitus type 2 with neurological manifestations (HCC)    Diabetic peripheral neuropathy (HCC)    Generalized osteoarthritis    GERD (gastroesophageal reflux disease)    Hx of seasonal allergies    Hyperlipidemia    Hypertension    IBS (irritable bowel syndrome)    S/P total hysterectomy 06/21/2011    Past Surgical History:  Procedure Laterality Date   CATARACT EXTRACTION Left    EYE SURGERY Right 01/2018   Removed raised area on eye. Dr.Groat    OVARIAN CYST SURGERY  1986   PELVIC LAPAROSCOPY     TONSILECTOMY, ADENOIDECTOMY, BILATERAL MYRINGOTOMY AND TUBES  1974   VAGINAL HYSTERECTOMY  1983     Medications:  Outpatient Encounter Medications as of 09/18/2022  Medication Sig   Accu-Chek Softclix Lancets lancets Use to test blood sugar one time daily for diabetes. Dx:E11.42   acetaminophen (TYLENOL) 500 MG tablet Take 1 tablet (500 mg total) by mouth every 8 (eight) hours as needed for moderate pain.   Alcohol Swabs (DROPSAFE ALCOHOL PREP) 70 % PADS USE TO TEST BLOOD SUGAR TWICE DAILY.   amLODipine (NORVASC) 5 MG tablet TAKE 1 TABLET EVERY DAY   b complex vitamins capsule Take 1 capsule by mouth  daily.   Blood Glucose Monitoring Suppl (ACCU-CHEK AVIVA PLUS) w/Device KIT Use to test blood sugar twice daily. Dx E11.42   cholecalciferol (VITAMIN D) 1000 UNITS tablet Take 1,000 Units by mouth daily.   fluticasone (FLONASE) 50 MCG/ACT nasal spray Place 2 sprays into both nostrils as needed.    glucose blood (ACCU-CHEK AVIVA PLUS) test strip USE TO TEST BLOOD SUGAR TWICE DAILY.   Lancets Misc. (ACCU-CHEK SOFTCLIX LANCET DEV) KIT Use to test blood sugar. Dx: E11.42   latanoprost (XALATAN) 0.005 % ophthalmic  solution 1 drop at bedtime.   loratadine (CLARITIN) 10 MG tablet Take 10 mg by mouth daily as needed.   metFORMIN (GLUCOPHAGE) 500 MG tablet TAKE 2 TABLETS EVERY DAY WITH BREAKFAST   metoprolol tartrate (LOPRESSOR) 25 MG tablet Take 0.5 tablets (12.5 mg total) by mouth 2 (two) times daily as needed (palpitations).   Multiple Vitamins-Minerals (MULTIVITAMIN WITH MINERALS) tablet Take 1 tablet by mouth daily.   multivitamin-lutein (OCUVITE-LUTEIN) CAPS capsule Take 1 capsule by mouth daily.   ofloxacin (OCUFLOX) 0.3 % ophthalmic solution    polyethylene glycol (MIRALAX / GLYCOLAX) packet Take 17 g by mouth daily as needed. For constipation   prednisoLONE acetate (PRED FORTE) 1 % ophthalmic suspension SMARTSIG:In Eye(s)   Probiotic Product (ALIGN) 4 MG CAPS Take 1 capsule by mouth daily.    Propylene Glycol (SYSTANE COMPLETE OP) Place 1 drop into both eyes in the morning, at noon, in the evening, and at bedtime.   rosuvastatin (CRESTOR) 5 MG tablet TAKE 1 TABLET EVERY DAY   FLUAD QUADRIVALENT 0.5 ML injection  (Patient not taking: Reported on 09/18/2022)   No facility-administered encounter medications on file as of 09/18/2022.    Allergies:  Allergies  Allergen Reactions   Aspirin    Pravastatin Sodium Other (See Comments)    Muscle aches   Restasis [Cyclosporine] Other (See Comments)   Lisinopril Other (See Comments)    Blood pressures low and causes constipation    Family History: Family History  Problem Relation Age of Onset   Diabetes Mother    Cancer Mother        colon    Diabetes Father    Heart disease Father    Pancreatic cancer Sister    Breast cancer Maternal Aunt    Breast cancer Paternal Aunt    Cancer Paternal Grandmother        liver    Breast cancer Cousin    Breast cancer Cousin    Pancreatic cancer Brother     Social History: Social History   Tobacco Use   Smoking status: Never   Smokeless tobacco: Never  Vaping Use   Vaping Use: Never used  Substance  Use Topics   Alcohol use: No   Drug use: No   Social History   Social History Narrative   Diet?    Low fat, low carb, low salt      Do you drink/eat things with caffeine?      Marital status?  single                                  What year were you married?      Do you live in a house, apartment, assisted living, condo, trailer, etc.?      Is it one or more stories?      How many persons live in your home?  Do you have any pets in your home? (please list)      Current or past profession:      Do you exercise?   yes                 Type & how often? Yoga, water aerobics 1-2x/week      Do you have a living will?      Do you have a DNR form?                                  If not, do you want to discuss one?      Do you have signed POA/HPOA for forms?          Right Handed    Lives in a senior home on the 16th floor.        Vital Signs:  BP (!) 111/56   Pulse 75   Ht 5\' 6"  (1.676 m)   Wt 145 lb (65.8 kg)   SpO2 95%   BMI 23.40 kg/m     Neurological Exam: MENTAL STATUS including orientation to time, place, person, recent and remote memory, attention span and concentration, language, and fund of knowledge is normal.  Speech is not dysarthric.  CRANIAL NERVES: II:  No visual field defects.     III-IV-VI: Pupils equal round and reactive to light.  Normal conjugate, extra-ocular eye movements in all directions of gaze.  No nystagmus.  No ptosis.   V:  Normal facial sensation.    VII:  Normal facial symmetry and movements.   VIII:  Normal hearing and vestibular function.   IX-X:  Normal palatal movement.   XI:  Normal shoulder shrug and head rotation.   XII:  Normal tongue strength and range of motion, no deviation or fasciculation.  MOTOR:  Motor strength is 5/5 throughout, including distally. No atrophy, fasciculations or abnormal movements.  No pronator drift.   MSRs:                                           Right        Left brachioradialis 2+  2+   biceps 2+  2+  triceps 2+  2+  patellar 3+  3+  ankle jerk 2+  2+  Hoffman no  no  plantar response down  down   SENSORY:  Vibration and temperature reduced at the feet.  Pin prick intact throughout. Rhomberg testing is positive.  COORDINATION/GAIT: Normal finger-to- nose-finger.  Intact rapid alternating movements bilaterally.  Gait is stable when assisted with cane.    Thank you for allowing me to participate in patient's care.  If I can answer any additional questions, I would be pleased to do so.    Sincerely,    Aniah Pauli K. Allena Katz, DO

## 2022-10-02 ENCOUNTER — Encounter: Payer: Self-pay | Admitting: Family

## 2022-11-08 ENCOUNTER — Encounter: Payer: Self-pay | Admitting: Cardiology

## 2022-11-13 DIAGNOSIS — H00021 Hordeolum internum right upper eyelid: Secondary | ICD-10-CM | POA: Diagnosis not present

## 2022-11-20 ENCOUNTER — Other Ambulatory Visit: Payer: Self-pay | Admitting: Family

## 2022-11-20 ENCOUNTER — Encounter: Payer: Self-pay | Admitting: Podiatry

## 2022-11-20 ENCOUNTER — Ambulatory Visit: Payer: Medicare HMO | Admitting: Podiatry

## 2022-11-20 DIAGNOSIS — M79609 Pain in unspecified limb: Secondary | ICD-10-CM | POA: Diagnosis not present

## 2022-11-20 DIAGNOSIS — E1142 Type 2 diabetes mellitus with diabetic polyneuropathy: Secondary | ICD-10-CM

## 2022-11-20 DIAGNOSIS — B351 Tinea unguium: Secondary | ICD-10-CM | POA: Diagnosis not present

## 2022-11-20 DIAGNOSIS — Z1231 Encounter for screening mammogram for malignant neoplasm of breast: Secondary | ICD-10-CM

## 2022-11-20 DIAGNOSIS — L84 Corns and callosities: Secondary | ICD-10-CM

## 2022-11-20 NOTE — Progress Notes (Signed)
  Subjective:  Patient ID: Sierra Farkas V., female    DOB: 09/21/1945,  MRN: [id number]  Sierra Marotto V. presents to clinic today for at risk foot care with history of diabetic neuropathy and painful thick toenails that are difficult to trim. Pain interferes with ambulation. Aggravating factors include wearing enclosed shoe gear. Pain is relieved with periodic professional debridement. Patient relates she has a "blockage" in her RLE. She is being followed by Vascular and is thinking about intervention. Chief Complaint  Patient presents with   Diabetes    [facility] BS - 98 A1C - 6.7 LVPCP - 07/2022   New problem(s): None.   PCP is [name], Donalee Citrin, NP.  Allergies  Allergen Reactions   Aspirin    Pravastatin Sodium Other (See Comments)    Muscle aches   Restasis [Cyclosporine] Other (See Comments)   Lisinopril Other (See Comments)    Blood pressures low and causes constipation    Review of Systems: Negative except as noted in the HPI.  Objective: No changes noted in today's physical examination. There were no vitals filed for this visit. Sierra Hulett V. is a pleasant 77 y.o. female WD, WN in NAD. AAO x 3.  Vascular Examination: Capillary refill time immediate b/l. Vascular status intact b/l with palpable pedal pulses. Pedal hair present b/l. No pain with calf compression b/l. Skin temperature gradient WNL b/l. No cyanosis or clubbing b/l. No ischemia or gangrene noted b/l. Trace edema noted BLE. Varicosities present b/l lower extremities right >left.  Neurological Examination: Sensation grossly intact b/l with 10 gram monofilament. Vibratory sensation intact b/l. Pt has subjective symptoms of neuropathy.  Dermatological Examination: Pedal skin with normal turgor, texture and tone b/l.  No open wounds. No interdigital macerations.   Toenails 1-5 b/l thick, discolored, elongated with subungual debris and pain on dorsal palpation.   Hyperpigmentation consistent with  findings of chronic venous insufficiency is present b/l lower extremities.  Hyperkeratotic lesion(s) submet head 1 b/l.  No erythema, no edema, no drainage, no fluctuance.  Musculoskeletal Examination: Muscle strength 5/5 to all lower extremity muscle groups bilaterally. No pain, crepitus or joint limitation noted with ROM bilateral LE. No gross bony deformities bilaterally. Utilizes cane for ambulation assistance.  Radiographs: None  Last A1c:   Lab Results  Component Value Date   HGBA1C 6.9 (H) 08/01/2022   Assessment/Plan: 1. Pain due to onychomycosis of nail   2. Callus   3. Type 2 diabetes mellitus with diabetic polyneuropathy, without long-term current use of insulin (HCC)     -Consent given for treatment as described below: -Examined patient. -We discussed her venous disease and I recommended she consider procedure discussed with Vascular to alleviate her symptoms. -Continue foot and shoe inspections daily. Monitor blood glucose per PCP/Endocrinologist's recommendations. -Patient to continue soft, supportive shoe gear daily. -Toenails 1-5 b/l were debrided in length and girth with sterile nail nippers and dremel without iatrogenic bleeding.  -Callus(es) submet head 1 b/l pared utilizing sterile scalpel blade without complication or incident. Total number debrided =2. -Patient/POA to call should there be question/concern in the interim.   Return in about 3 months (around 02/20/2023).  [name], DPM

## 2022-11-22 DIAGNOSIS — H00021 Hordeolum internum right upper eyelid: Secondary | ICD-10-CM | POA: Diagnosis not present

## 2022-11-30 ENCOUNTER — Telehealth: Payer: Self-pay

## 2022-11-30 NOTE — Telephone Encounter (Signed)
Call GYN office to schedule appointment unless you are having any acute issues then might need referral.

## 2022-11-30 NOTE — Telephone Encounter (Signed)
Patient called requesting referral to GYN Dr. Richardson Dopp or any doctor at [address] [facility])  Message sent to Richarda Blade, NP

## 2022-12-04 DIAGNOSIS — M26609 Unspecified temporomandibular joint disorder, unspecified side: Secondary | ICD-10-CM | POA: Insufficient documentation

## 2022-12-04 DIAGNOSIS — H6121 Impacted cerumen, right ear: Secondary | ICD-10-CM | POA: Diagnosis not present

## 2022-12-05 ENCOUNTER — Ambulatory Visit
Admission: RE | Admit: 2022-12-05 | Discharge: 2022-12-05 | Disposition: A | Discharge status: Home / Self Care | Payer: Medicare HMO | Source: Ambulatory Visit | Attending: Family | Admitting: Family

## 2022-12-05 DIAGNOSIS — Z1231 Encounter for screening mammogram for malignant neoplasm of breast: Secondary | ICD-10-CM | POA: Diagnosis not present

## 2022-12-12 ENCOUNTER — Ambulatory Visit: Payer: Medicare HMO | Admitting: Vascular Surgery

## 2022-12-18 ENCOUNTER — Ambulatory Visit: Payer: Medicare HMO | Admitting: Cardiology

## 2022-12-25 HISTORY — PX: CATARACT EXTRACTION: SUR2

## 2022-12-28 DIAGNOSIS — H401132 Primary open-angle glaucoma, bilateral, moderate stage: Secondary | ICD-10-CM | POA: Diagnosis not present

## 2022-12-28 DIAGNOSIS — H43811 Vitreous degeneration, right eye: Secondary | ICD-10-CM | POA: Diagnosis not present

## 2022-12-28 DIAGNOSIS — H25811 Combined forms of age-related cataract, right eye: Secondary | ICD-10-CM | POA: Diagnosis not present

## 2022-12-28 DIAGNOSIS — Z961 Presence of intraocular lens: Secondary | ICD-10-CM | POA: Diagnosis not present

## 2022-12-28 DIAGNOSIS — E119 Type 2 diabetes mellitus without complications: Secondary | ICD-10-CM | POA: Diagnosis not present

## 2023-01-02 ENCOUNTER — Ambulatory Visit: Payer: Medicare HMO | Admitting: Vascular Surgery

## 2023-01-09 ENCOUNTER — Encounter: Payer: Self-pay | Admitting: Family

## 2023-01-09 ENCOUNTER — Ambulatory Visit: Payer: Medicare HMO | Admitting: Family

## 2023-01-09 VITALS — BP 128/70 | HR 73 | Temp 97.0°F | Resp 16 | Ht 66.0 in | Wt 150.0 lb

## 2023-01-09 DIAGNOSIS — Z23 Encounter for immunization: Secondary | ICD-10-CM | POA: Diagnosis not present

## 2023-01-09 DIAGNOSIS — Z0000 Encounter for general adult medical examination without abnormal findings: Secondary | ICD-10-CM

## 2023-01-09 NOTE — Patient Instructions (Signed)
Sierra Ray , Thank you for taking time to come for your Medicare Wellness Visit. I appreciate your ongoing commitment to your health goals. Please review the following plan we discussed and let me know if I can assist you in the future.   Screening recommendations/referrals: Colonoscopy : Up date  Mammogram  : Up date  Bone Density  : Up date  Recommended yearly ophthalmology/optometry visit for glaucoma screening and checkup Recommended yearly dental visit for hygiene and checkup  Vaccinations: Influenza vaccine- due annually in September/October Pneumococcal vaccine  : Up date  Tdap vaccine : Given today  Shingles vaccine : decline     Advanced directives: Yes   Conditions/risks identified:  advanced age (>70men, >75 women);diabetes mellitus;hypertension  Next appointment: 1 year    Preventive Care 75 Years and Older, Female Preventive care refers to lifestyle choices and visits with your health care provider that can promote health and wellness. What does preventive care include? A yearly physical exam. This is also called an annual well check. Dental exams once or twice a year. Routine eye exams. Ask your health care provider how often you should have your eyes checked. Personal lifestyle choices, including: Daily care of your teeth and gums. Regular physical activity. Eating a healthy diet. Avoiding tobacco and drug use. Limiting alcohol use. Practicing safe sex. Taking low-dose aspirin every day. Taking vitamin and mineral supplements as recommended by your health care provider. What happens during an annual well check? The services and screenings done by your health care provider during your annual well check will depend on your age, overall health, lifestyle risk factors, and family history of disease. Counseling  Your health care provider may ask you questions about your: Alcohol use. Tobacco use. Drug use. Emotional well-being. Home and relationship  well-being. Sexual activity. Eating habits. History of falls. Memory and ability to understand (cognition). Work and work Astronomer. Reproductive health. Screening  You may have the following tests or measurements: Height, weight, and BMI. Blood pressure. Lipid and cholesterol levels. These may be checked every 5 years, or more frequently if you are over 33 years old. Skin check. Lung cancer screening. You may have this screening every year starting at age 25 if you have a 30-pack-year history of smoking and currently smoke or have quit within the past 15 years. Fecal occult blood test (FOBT) of the stool. You may have this test every year starting at age 45. Flexible sigmoidoscopy or colonoscopy. You may have a sigmoidoscopy every 5 years or a colonoscopy every 10 years starting at age 3. Hepatitis C blood test. Hepatitis B blood test. Sexually transmitted disease (STD) testing. Diabetes screening. This is done by checking your blood sugar (glucose) after you have not eaten for a while (fasting). You may have this done every 1-3 years. Bone density scan. This is done to screen for osteoporosis. You may have this done starting at age 13. Mammogram. This may be done every 1-2 years. Talk to your health care provider about how often you should have regular mammograms. Talk with your health care provider about your test results, treatment options, and if necessary, the need for more tests. Vaccines  Your health care provider may recommend certain vaccines, such as: Influenza vaccine. This is recommended every year. Tetanus, diphtheria, and acellular pertussis (Tdap, Td) vaccine. You may need a Td booster every 10 years. Zoster vaccine. You may need this after age 65. Pneumococcal 13-valent conjugate (PCV13) vaccine. One dose is recommended after age 68. Pneumococcal polysaccharide (  PPSV23) vaccine. One dose is recommended after age 86. Talk to your health care provider about which  screenings and vaccines you need and how often you need them. This information is not intended to replace advice given to you by your health care provider. Make sure you discuss any questions you have with your health care provider. Document Released: 04/29/2015 Document Revised: 12/21/2015 Document Reviewed: 02/01/2015 Elsevier Interactive Patient Education  2017 [employer].  Fall Prevention in the Home Falls can cause injuries. They can happen to people of all ages. There are many things you can do to make your home safe and to help prevent falls. What can I do on the outside of my home? Regularly fix the edges of walkways and driveways and fix any cracks. Remove anything that might make you trip as you walk through a door, such as a raised step or threshold. Trim any bushes or trees on the path to your home. Use bright outdoor lighting. Clear any walking paths of anything that might make someone trip, such as rocks or tools. Regularly check to see if handrails are loose or broken. Make sure that both sides of any steps have handrails. Any raised decks and porches should have guardrails on the edges. Have any leaves, snow, or ice cleared regularly. Use sand or salt on walking paths during winter. Clean up any spills in your garage right away. This includes oil or grease spills. What can I do in the bathroom? Use night lights. Install grab bars by the toilet and in the tub and shower. Do not use towel bars as grab bars. Use non-skid mats or decals in the tub or shower. If you need to sit down in the shower, use a plastic, non-slip stool. Keep the floor dry. Clean up any water that spills on the floor as soon as it happens. Remove soap buildup in the tub or shower regularly. Attach bath mats securely with double-sided non-slip rug tape. Do not have throw rugs and other things on the floor that can make you trip. What can I do in the bedroom? Use night lights. Make sure that you have a  light by your bed that is easy to reach. Do not use any sheets or blankets that are too big for your bed. They should not hang down onto the floor. Have a firm chair that has side arms. You can use this for support while you get dressed. Do not have throw rugs and other things on the floor that can make you trip. What can I do in the kitchen? Clean up any spills right away. Avoid walking on wet floors. Keep items that you use a lot in easy-to-reach places. If you need to reach something above you, use a strong step stool that has a grab bar. Keep electrical cords out of the way. Do not use floor polish or wax that makes floors slippery. If you must use wax, use non-skid floor wax. Do not have throw rugs and other things on the floor that can make you trip. What can I do with my stairs? Do not leave any items on the stairs. Make sure that there are handrails on both sides of the stairs and use them. Fix handrails that are broken or loose. Make sure that handrails are as long as the stairways. Check any carpeting to make sure that it is firmly attached to the stairs. Fix any carpet that is loose or worn. Avoid having throw rugs at the top or  bottom of the stairs. If you do have throw rugs, attach them to the floor with carpet tape. Make sure that you have a light switch at the top of the stairs and the bottom of the stairs. If you do not have them, ask someone to add them for you. What else can I do to help prevent falls? Wear shoes that: Do not have high heels. Have rubber bottoms. Are comfortable and fit you well. Are closed at the toe. Do not wear sandals. If you use a stepladder: Make sure that it is fully opened. Do not climb a closed stepladder. Make sure that both sides of the stepladder are locked into place. Ask someone to hold it for you, if possible. Clearly mark and make sure that you can see: Any grab bars or handrails. First and last steps. Where the edge of each step  is. Use tools that help you move around (mobility aids) if they are needed. These include: Canes. Walkers. Scooters. Crutches. Turn on the lights when you go into a dark area. Replace any light bulbs as soon as they burn out. Set up your furniture so you have a clear path. Avoid moving your furniture around. If any of your floors are uneven, fix them. If there are any pets around you, be aware of where they are. Review your medicines with your doctor. Some medicines can make you feel dizzy. This can increase your chance of falling. Ask your doctor what other things that you can do to help prevent falls. This information is not intended to replace advice given to you by your health care provider. Make sure you discuss any questions you have with your health care provider. Document Released: 01/27/2009 Document Revised: 09/08/2015 Document Reviewed: 05/07/2014 Elsevier Interactive Patient Education  2017 [employer].

## 2023-01-09 NOTE — Progress Notes (Signed)
Subjective:   Sierra Burbage V. is a 77 y.o. female who presents for Medicare Annual (Subsequent) preventive examination.  Visit Complete: In person  Patient Medicare AWV questionnaire was completed by the patient on 01/09/2023; I have confirmed that all information answered by patient is correct and no changes since this date.  Cardiac Risk Factors include: advanced age (>18men, >43 women);diabetes mellitus;hypertension     Objective:    Today's Vitals   01/09/23 0934 01/09/23 0946  BP: 128/70   Pulse: 73   Resp: 16   Temp: (!) 97 F (36.1 C)   SpO2: 97%   Weight: 150 lb (68 kg)   Height: 5\' 6"  (1.676 m)   PainSc:  8    Body mass index is 24.21 kg/m.     01/09/2023    9:33 AM 09/18/2022    8:50 AM 02/09/2022   11:07 AM 01/08/2022    8:42 AM 08/03/2021   10:18 AM 06/19/2021    3:27 PM 05/05/2021   10:35 AM  Advanced Directives  Does Patient Have a Medical Advance Directive? Yes Yes Yes Yes Yes Yes Yes  Type of Advance Directive Living will Healthcare Power of [place];Living will;Out of facility DNR (pink MOST or yellow form) Healthcare Power of [employer] Power of Attorney Living will Living will Living will  Does patient want to make changes to medical advance directive? Yes (Inpatient - patient defers changing a medical advance directive at this time - Information given)  No - Patient declined No - Patient declined No - Patient declined No - Patient declined No - Patient declined  Copy of Healthcare Power of Attorney in Chart?   Yes - validated most recent copy scanned in chart (See row information) Yes - validated most recent copy scanned in chart (See row information)       Current Medications (verified) Outpatient Encounter Medications as of 01/09/2023  Medication Sig   Accu-Chek Softclix Lancets lancets Use to test blood sugar one time daily for diabetes. Dx:E11.42   acetaminophen (TYLENOL) 500 MG tablet Take 1 tablet (500 mg total) by mouth every 8  (eight) hours as needed for moderate pain.   Alcohol Swabs (DROPSAFE ALCOHOL PREP) 70 % PADS USE TO TEST BLOOD SUGAR TWICE DAILY.   amLODipine (NORVASC) 5 MG tablet TAKE 1 TABLET EVERY DAY   b complex vitamins capsule Take 1 capsule by mouth daily.   Blood Glucose Monitoring Suppl (ACCU-CHEK AVIVA PLUS) w/Device KIT Use to test blood sugar twice daily. Dx E11.42   cholecalciferol (VITAMIN D) 1000 UNITS tablet Take 1,000 Units by mouth daily.   FLUAD QUADRIVALENT 0.5 ML injection    fluticasone (FLONASE) 50 MCG/ACT nasal spray Place 2 sprays into both nostrils as needed.    glucose blood (ACCU-CHEK AVIVA PLUS) test strip USE TO TEST BLOOD SUGAR TWICE DAILY.   Lancets Misc. (ACCU-CHEK SOFTCLIX LANCET DEV) KIT Use to test blood sugar. Dx: E11.42   latanoprost (XALATAN) 0.005 % ophthalmic solution 1 drop at bedtime.   loratadine (CLARITIN) 10 MG tablet Take 10 mg by mouth daily as needed.   metFORMIN (GLUCOPHAGE) 500 MG tablet TAKE 2 TABLETS EVERY DAY WITH BREAKFAST   metoprolol tartrate (LOPRESSOR) 25 MG tablet Take 0.5 tablets (12.5 mg total) by mouth 2 (two) times daily as needed (palpitations).   Multiple Vitamins-Minerals (MULTIVITAMIN WITH MINERALS) tablet Take 1 tablet by mouth daily.   multivitamin-lutein (OCUVITE-LUTEIN) CAPS capsule Take 1 capsule by mouth daily.   ofloxacin (OCUFLOX) 0.3 % ophthalmic solution  polyethylene glycol (MIRALAX / GLYCOLAX) packet Take 17 g by mouth daily as needed. For constipation   prednisoLONE acetate (PRED FORTE) 1 % ophthalmic suspension SMARTSIG:In Eye(s)   Probiotic Product (ALIGN) 4 MG CAPS Take 1 capsule by mouth daily.    Propylene Glycol (SYSTANE COMPLETE OP) Place 1 drop into both eyes in the morning, at noon, in the evening, and at bedtime.   rosuvastatin (CRESTOR) 5 MG tablet TAKE 1 TABLET EVERY DAY   No facility-administered encounter medications on file as of 01/09/2023.    Allergies (verified) Aspirin, Pravastatin sodium, Restasis  [cyclosporine], and Lisinopril   History: Past Medical History:  Diagnosis Date   Abnormal Pap smear 05/08/2011   ASC-US +HPV   Arthritis    Asthma    Constipation    Diabetes mellitus type 2 with neurological manifestations (HCC)    Diabetic peripheral neuropathy (HCC)    Generalized osteoarthritis    GERD (gastroesophageal reflux disease)    Hx of seasonal allergies    Hyperlipidemia    Hypertension    IBS (irritable bowel syndrome)    S/P total hysterectomy 06/21/2011   Past Surgical History:  Procedure Laterality Date   CATARACT EXTRACTION Left    EYE SURGERY Right 01/2018   Removed raised area on eye. Dr.Groat    OVARIAN CYST SURGERY  1986   PELVIC LAPAROSCOPY     TONSILECTOMY, ADENOIDECTOMY, BILATERAL MYRINGOTOMY AND TUBES  1974   VAGINAL HYSTERECTOMY  1983   Family History  Problem Relation Age of Onset   Diabetes Mother    Cancer Mother        colon    Diabetes Father    Heart disease Father    Pancreatic cancer Sister    Breast cancer Maternal Aunt    Breast cancer Paternal Aunt    Cancer Paternal Grandmother        liver    Breast cancer Cousin    Breast cancer Cousin    Pancreatic cancer Brother    Social History   Socioeconomic History   Marital status: Divorced    Spouse name: Not on file   Number of children: 4   Years of education: Not on file   Highest education level: Not on file  Occupational History    Employer: RETIRED  Tobacco Use   Smoking status: Never   Smokeless tobacco: Never  Vaping Use   Vaping status: Never Used  Substance and Sexual Activity   Alcohol use: No   Drug use: No   Sexual activity: Not Currently    Birth control/protection: Surgical    Comment: 1st intercourse- 20, partners- 8,   Other Topics Concern   Not on file  Social History Narrative   Diet?    Low fat, low carb, low salt      Do you drink/eat things with caffeine?      Marital status?  single                                  What year were you  married?      Do you live in a house, apartment, assisted living, condo, trailer, etc.?      Is it one or more stories?      How many persons live in your home?      Do you have any pets in your home? (please list)      Current or past profession:  Do you exercise?   yes                 Type & how often? Yoga, water aerobics 1-2x/week      Do you have a living will?      Do you have a DNR form?                                  If not, do you want to discuss one?      Do you have signed POA/HPOA for forms?          Right Handed    Lives in a senior home on the 16th floor.       Social Determinants of Health   Financial Resource Strain: Low Risk  (07/24/2017)   Overall Financial Resource Strain (CARDIA)    Difficulty of Paying Living Expenses: Not hard at all  Food Insecurity: No Food Insecurity (07/24/2017)   Hunger Vital Sign    Worried About Running Out of Food in the Last Year: Never true    Ran Out of Food in the Last Year: Never true  Transportation Needs: No Transportation Needs (07/24/2017)   PRAPARE - Administrator, Civil Service (Medical): No    Lack of Transportation (Non-Medical): No  Physical Activity: Sufficiently Active (07/24/2017)   Exercise Vital Sign    Days of Exercise per Week: 3 days    Minutes of Exercise per Session: 50 min  Stress: No Stress Concern Present (07/24/2017)   [employer] of Occupational Health - Occupational Stress Questionnaire    Feeling of Stress : Not at all  Social Connections: Somewhat Isolated (07/24/2017)   Social Connection and Isolation Panel [NHANES]    Frequency of Communication with Friends and Family: More than three times a week    Frequency of Social Gatherings with Friends and Family: More than three times a week    Attends Religious Services: More than 4 times per year    Active Member of [employer] or Organizations: No    Attends Engineer, structural: Never    Marital Status: Never married     Tobacco Counseling Counseling given: Not Answered   Clinical Intake:  Pre-visit preparation completed: No  Pain : 0-10 Pain Score: 8  Pain Type: Chronic pain Pain Location: Neck Pain Orientation: Medial Pain Radiating Towards: Tylenol and OTC topical analgeic Pain Descriptors / Indicators: Sharp Pain Onset: More than a month ago Pain Frequency: Intermittent Pain Relieving Factors: Tylenol and Topical anlagisc Effect of Pain on Daily Activities: No  Pain Relieving Factors: Tylenol and Topical anlagisc  BMI - recorded: 24.21 Nutritional Status: BMI of 19-24  Normal Nutritional Risks: None Diabetes: Yes CBG done?: No (110 at home) Did pt. bring in CBG monitor from home?: No (recalls 90's -110's)  How often do you need to have someone help you when you read instructions, pamphlets, or other written materials from your doctor or pharmacy?: 1 - Never What is the last grade level you completed in school?: 2 year college  Interpreter Needed?: No      Activities of Daily Living    01/09/2023    9:59 AM  In your present state of health, do you have any difficulty performing the following activities:  Hearing? 0  Vision? 1  Comment has upcoming surgery  Difficulty concentrating or making decisions? 1  Comment Remembering  Walking or climbing stairs?  1  Dressing or bathing? 0  Doing errands, shopping? 1  Comment Has Wellsite geologist and eating ? N  Using the Toilet? N  In the past six months, have you accidently leaked urine? Y  Do you have problems with loss of bowel control? N  Managing your Medications? N  Managing your Finances? N  Housekeeping or managing your Housekeeping? N    Patient Care Team: Ranen Doolin, Donalee Citrin, NP as PCP - General (Family Medicine) Meriam Sprague, MD as PCP - Cardiology (Cardiology) Lars Masson, MD as Consulting Physician (Cardiology) Jeani Hawking, MD as Consulting Physician (Gastroenterology) Karie Soda, MD as Consulting Physician (General Surgery) Glendale Chard, DO as Consulting Physician (Neurology) [facility], [facility]  Indicate any recent Medical Services you may have received from other than [facility] providers in the past year (date may be approximate).     Assessment:   This is a routine wellness examination for Delisha.  Hearing/Vision screen No results found.   Goals Addressed             This Visit's Progress    Exercise 3x per week (30 min per time)   Not on track    Starting 07/03/16 I will start doing exercise three days a week.I would like to walk a total of 30 min three times a week and eventually get to aquatics.     Patient Stated   Not on track    Patient will like to get back into aquatics with her friends     Patient Stated   Not on track    Be able sleep 6-8 hrs        Depression Screen    01/09/2023    9:33 AM 05/30/2022    1:06 PM 01/08/2022    9:30 AM 01/08/2022    8:40 AM 08/10/2021    1:46 PM 12/27/2020    1:15 PM 12/25/2019    8:15 AM  PHQ 2/9 Scores  PHQ - 2 Score 0 0 0 0 0 0 0  PHQ- 9 Score    0       Fall Risk    01/09/2023    9:33 AM 09/18/2022    8:50 AM 08/07/2022    1:58 PM 05/30/2022    1:06 PM 02/09/2022   11:07 AM  Fall Risk   Falls in the past year? 0 0 0 0 0  Number falls in past yr:  0 0 0 0  Injury with Fall?  0 0 0 0  Risk for fall due to :   No Fall Risks History of fall(s) No Fall Risks  Follow up  Falls evaluation completed Falls evaluation completed  Falls evaluation completed    MEDICARE RISK AT HOME: Medicare Risk at Home Any stairs in or around the home?: No If so, are there any without handrails?: No Home free of loose throw rugs in walkways, pet beds, electrical cords, etc?: No Adequate lighting in your home to reduce risk of falls?: Yes Life alert?: No Use of a cane, walker or w/c?: Yes Grab bars in the bathroom?: Yes Shower chair or bench in shower?: Yes Elevated toilet seat or a handicapped  toilet?: Yes  TIMED UP AND GO:  Was the test performed?  Yes  Length of time to ambulate 10 feet: 9 sec Gait slow and steady without use of assistive device    Cognitive Function:    01/09/2023    9:36 AM 07/24/2017  8:41 AM 07/03/2016    8:57 AM 07/01/2015    3:13 PM  MMSE - Mini Mental State Exam  Not completed:    --  Orientation to time 5 5 5 5   Orientation to Place 5 5 5 5   Registration 3 3 3 3   Attention/ Calculation 5 5 5 5   Recall 1 1 2 3   Language- name 2 objects 2 2 2 2   Language- repeat 1 1 1 1   Language- follow 3 step command 3 3 3 3   Language- read & follow direction 1 1 1 1   Write a sentence 1 1 1 1   Copy design 1 1 1 1   Total score 28 28 29 30         01/08/2022    8:43 AM 12/27/2020    1:17 PM 12/25/2019    8:16 AM  6CIT Screen  What Year? 0 points 0 points 0 points  What month? 0 points 0 points 0 points  What time? 0 points 0 points 0 points  Count back from 20 0 points 0 points 0 points  Months in reverse 0 points 0 points 0 points  Repeat phrase 2 points 4 points 0 points  Total Score 2 points 4 points 0 points    Immunizations Immunization History  Administered Date(s) Administered   Fluad Quad(high Dose 65+) 02/11/2019, 02/12/2020, 01/27/2021   Influenza, High Dose Seasonal PF 01/28/2018, 01/23/2022   Influenza,inj,Quad PF,6+ Mos 02/03/2016, 01/23/2017   Moderna Sars-Covid-2 Vaccination 06/22/2019, 07/24/2019, 03/24/2020   PFIZER(Purple Top)SARS-COV-2 Vaccination 04/26/2021   Pneumococcal Conjugate-13 07/30/2014   Pneumococcal Polysaccharide-23 11/17/2005, 05/20/2012   Td 08/18/2010, 02/16/2021   Tdap 02/16/2021    TDAP status: Up to date  Flu Vaccine status: Completed at today's visit  Pneumococcal vaccine status: Up to date  Covid-19 vaccine status: Information provided on how to obtain vaccines.   Qualifies for Shingles Vaccine? Yes   Zostavax completed No   Shingrix Completed?: No.    Education has been provided regarding the  importance of this vaccine. Patient has been advised to call insurance company to determine out of pocket expense if they have not yet received this vaccine. Advised may also receive vaccine at local pharmacy or Health Dept. Verbalized acceptance and understanding.  Screening Tests Health Maintenance  Topic Date Due   INFLUENZA VACCINE  11/15/2022   COVID-19 Vaccine (5 - 2023-24 season) 12/16/2022   FOOT EXAM  01/02/2023   Diabetic kidney evaluation - Urine ACR  02/06/2023   HEMOGLOBIN A1C  01/31/2023   OPHTHALMOLOGY EXAM  06/26/2023   Diabetic kidney evaluation - eGFR measurement  08/01/2023   Medicare Annual Wellness (AWV)  01/09/2024   DTaP/Tdap/Td (4 - Td or Tdap) 02/17/2031   Colonoscopy  03/08/2031   Pneumonia Vaccine 26+ Years old  Completed   DEXA SCAN  Completed   Hepatitis C Screening  Completed   HPV VACCINES  Aged Out   Zoster Vaccines- Shingrix  Discontinued    Health Maintenance  Health Maintenance Due  Topic Date Due   INFLUENZA VACCINE  11/15/2022   COVID-19 Vaccine (5 - 2023-24 season) 12/16/2022   FOOT EXAM  01/02/2023   Diabetic kidney evaluation - Urine ACR  02/06/2023    Colorectal cancer screening: Type of screening: Colonoscopy. Completed 2022. Repeat every 3 years  Mammogram status: Completed 2024. Repeat every year  Bone Density status: Completed 08/23/2020. Results reflect: Bone density results: OSTEOPENIA. Repeat every 2 years.  Lung Cancer Screening: (Low Dose CT Chest recommended if  Age 55-80 years, 20 pack-year currently smoking OR have quit w/in 15years.) does not qualify.   Lung Cancer Screening Referral: No   Additional Screening:  Hepatitis C Screening: does qualify; Completed Yes   Vision Screening: Recommended annual ophthalmology exams for early detection of glaucoma and other disorders of the eye. Is the patient up to date with their annual eye exam?  Yes  Who is the provider or what is the name of the office in which the patient  attends annual eye exams? Dr.Groat  If pt is not established with a provider, would they like to be referred to a provider to establish care? No .   Dental Screening: Recommended annual dental exams for proper oral hygiene  Diabetic Foot Exam: Diabetic Foot Exam: Completed 12/16/2022   Community Resource Referral / Chronic Care Management: CRR required this visit?  No   CCM required this visit?  No     Plan:     I have personally reviewed and noted the following in the patient's chart:   Medical and social history Use of alcohol, tobacco or illicit drugs  Current medications and supplements including opioid prescriptions. Patient is not currently taking opioid prescriptions. Functional ability and status Nutritional status Physical activity Advanced directives List of other physicians Hospitalizations, surgeries, and ER visits in previous 12 months Vitals Screenings to include cognitive, depression, and falls Referrals and appointments  In addition, I have reviewed and discussed with patient certain preventive protocols, quality metrics, and best practice recommendations. A written personalized care plan for preventive services as well as general preventive health recommendations were provided to patient.     Caesar Bookman, NP   01/09/2023   After Visit Summary: (In Person-Printed) AVS printed and given to the patient  Nurse Notes: Advised to get COVID-19 vaccine at the pharmacy.

## 2023-01-17 DIAGNOSIS — H2511 Age-related nuclear cataract, right eye: Secondary | ICD-10-CM | POA: Diagnosis not present

## 2023-01-24 ENCOUNTER — Other Ambulatory Visit: Payer: Self-pay

## 2023-01-24 ENCOUNTER — Other Ambulatory Visit: Payer: Self-pay | Admitting: Family

## 2023-01-24 DIAGNOSIS — H401112 Primary open-angle glaucoma, right eye, moderate stage: Secondary | ICD-10-CM | POA: Diagnosis not present

## 2023-01-24 DIAGNOSIS — I152 Hypertension secondary to endocrine disorders: Secondary | ICD-10-CM

## 2023-01-24 DIAGNOSIS — E1142 Type 2 diabetes mellitus with diabetic polyneuropathy: Secondary | ICD-10-CM

## 2023-01-24 DIAGNOSIS — H2511 Age-related nuclear cataract, right eye: Secondary | ICD-10-CM | POA: Diagnosis not present

## 2023-01-24 MED ORDER — METOPROLOL TARTRATE 25 MG PO TABS: 12.5000 mg | ORAL_TABLET | Freq: Two times a day (BID) | ORAL | 0 refills | Status: DC | PRN

## 2023-01-28 ENCOUNTER — Telehealth: Payer: Self-pay | Admitting: *Deleted

## 2023-01-28 DIAGNOSIS — N898 Other specified noninflammatory disorders of vagina: Secondary | ICD-10-CM

## 2023-01-28 NOTE — Telephone Encounter (Signed)
Patient called requesting a referral to Dr. Gerald Leitz, GYN for Vaginal Odor.   Patient stated that she called her insurance and they require a referral to be placed from PCP office.   Referral pended and sent to [facility] for approval

## 2023-01-28 NOTE — Telephone Encounter (Signed)
Gyn referral ordered as requested.

## 2023-01-31 DIAGNOSIS — R3989 Other symptoms and signs involving the genitourinary system: Secondary | ICD-10-CM | POA: Diagnosis not present

## 2023-01-31 DIAGNOSIS — N9489 Other specified conditions associated with female genital organs and menstrual cycle: Secondary | ICD-10-CM | POA: Diagnosis not present

## 2023-02-04 ENCOUNTER — Other Ambulatory Visit: Payer: Medicare HMO

## 2023-02-06 ENCOUNTER — Ambulatory Visit: Payer: Medicare HMO | Admitting: Family

## 2023-03-12 ENCOUNTER — Ambulatory Visit: Payer: Medicare HMO | Admitting: Internal Medicine

## 2023-03-18 ENCOUNTER — Telehealth: Payer: Self-pay | Admitting: Family

## 2023-03-18 ENCOUNTER — Other Ambulatory Visit: Payer: Medicare HMO

## 2023-03-18 DIAGNOSIS — E1159 Type 2 diabetes mellitus with other circulatory complications: Secondary | ICD-10-CM | POA: Diagnosis not present

## 2023-03-18 DIAGNOSIS — E1142 Type 2 diabetes mellitus with diabetic polyneuropathy: Secondary | ICD-10-CM | POA: Diagnosis not present

## 2023-03-18 DIAGNOSIS — I152 Hypertension secondary to endocrine disorders: Secondary | ICD-10-CM | POA: Diagnosis not present

## 2023-03-18 NOTE — Telephone Encounter (Signed)
Patient called requesting a referral be sent to Dr. Aldean Baker (orthopedics), [address] Phone: [phone].

## 2023-03-18 NOTE — Telephone Encounter (Signed)
Please verify reason for referral to orthopedic.might need appointment if new diagnosis.

## 2023-03-19 LAB — CBC WITH DIFFERENTIAL/PLATELET
Absolute Lymphocytes: 2610 {cells}/uL (ref 850–3900)
Absolute Monocytes: 484 {cells}/uL (ref 200–950)
Basophils Absolute: 52 {cells}/uL (ref 0–200)
Basophils Relative: 1 %
Eosinophils Absolute: 192 {cells}/uL (ref 15–500)
Eosinophils Relative: 3.7 %
HCT: 40.9 % (ref 35.0–45.0)
Hemoglobin: 13.2 g/dL (ref 11.7–15.5)
MCH: 27 pg (ref 27.0–33.0)
MCHC: 32.3 g/dL (ref 32.0–36.0)
MCV: 83.8 fL (ref 80.0–100.0)
MPV: 9.9 fL (ref 7.5–12.5)
Monocytes Relative: 9.3 %
Neutro Abs: 1862 {cells}/uL (ref 1500–7800)
Neutrophils Relative %: 35.8 %
Platelets: 179 10*3/uL (ref 140–400)
RBC: 4.88 10*6/uL (ref 3.80–5.10)
RDW: 13.2 % (ref 11.0–15.0)
Total Lymphocyte: 50.2 %
WBC: 5.2 10*3/uL (ref 3.8–10.8)

## 2023-03-19 LAB — COMPLETE METABOLIC PANEL WITH GFR
AG Ratio: 1.3 (calc) (ref 1.0–2.5)
ALT: 14 U/L (ref 6–29)
AST: 18 U/L (ref 10–35)
Albumin: 4 g/dL (ref 3.6–5.1)
Alkaline phosphatase (APISO): 62 U/L (ref 37–153)
BUN: 24 mg/dL (ref 7–25)
CO2: 29 mmol/L (ref 20–32)
Calcium: 9.3 mg/dL (ref 8.6–10.4)
Chloride: 104 mmol/L (ref 98–110)
Creat: 0.92 mg/dL (ref 0.60–1.00)
Globulin: 3.2 g/dL (ref 1.9–3.7)
Glucose, Bld: 123 mg/dL — ABNORMAL HIGH (ref 65–99)
Potassium: 4.4 mmol/L (ref 3.5–5.3)
Sodium: 140 mmol/L (ref 135–146)
Total Bilirubin: 0.4 mg/dL (ref 0.2–1.2)
Total Protein: 7.2 g/dL (ref 6.1–8.1)
eGFR: 65 mL/min/{1.73_m2} (ref >=60)

## 2023-03-19 LAB — TSH: TSH: 1.69 m[IU]/L (ref 0.40–4.50)

## 2023-03-19 LAB — LIPID PANEL
Cholesterol: 179 mg/dL (ref <200)
HDL: 75 mg/dL (ref >=50)
LDL Cholesterol (Calc): 81 mg/dL
Non-HDL Cholesterol (Calc): 104 mg/dL (ref <130)
Total CHOL/HDL Ratio: 2.4 (calc) (ref <5.0)
Triglycerides: 125 mg/dL (ref <150)

## 2023-03-19 LAB — HEMOGLOBIN A1C
Hgb A1c MFr Bld: 7.4 %{Hb} — ABNORMAL HIGH (ref <5.7)
Mean Plasma Glucose: 166 mg/dL
eAG (mmol/L): 9.2 mmol/L

## 2023-03-20 ENCOUNTER — Encounter: Payer: Self-pay | Admitting: Family

## 2023-03-20 ENCOUNTER — Ambulatory Visit (INDEPENDENT_AMBULATORY_CARE_PROVIDER_SITE_OTHER): Payer: Medicare HMO | Admitting: Family

## 2023-03-20 VITALS — BP 126/58 | HR 77 | Temp 97.8°F | Resp 20 | Ht 66.0 in | Wt 151.6 lb

## 2023-03-20 DIAGNOSIS — K219 Gastro-esophageal reflux disease without esophagitis: Secondary | ICD-10-CM | POA: Diagnosis not present

## 2023-03-20 DIAGNOSIS — G8929 Other chronic pain: Secondary | ICD-10-CM

## 2023-03-20 DIAGNOSIS — E785 Hyperlipidemia, unspecified: Secondary | ICD-10-CM | POA: Diagnosis not present

## 2023-03-20 DIAGNOSIS — I10 Essential (primary) hypertension: Secondary | ICD-10-CM | POA: Diagnosis not present

## 2023-03-20 DIAGNOSIS — E1142 Type 2 diabetes mellitus with diabetic polyneuropathy: Secondary | ICD-10-CM

## 2023-03-20 DIAGNOSIS — M25512 Pain in left shoulder: Secondary | ICD-10-CM | POA: Diagnosis not present

## 2023-03-20 MED ORDER — CYCLOBENZAPRINE HCL 5 MG PO TABS: 5.0000 mg | ORAL_TABLET | Freq: Three times a day (TID) | ORAL | 1 refills | Status: DC | PRN

## 2023-03-20 NOTE — Progress Notes (Signed)
Provider: Richarda Blade FNP-C   Lucero Auzenne, Donalee Citrin, NP  Patient Care Team: Lutie Pickler, Donalee Citrin, NP as PCP - General (Family Medicine) Meriam Sprague, MD (Inactive) as PCP - Cardiology (Cardiology) Lars Masson, MD as Consulting Physician (Cardiology) Jeani Hawking, MD as Consulting Physician (Gastroenterology) Karie Soda, MD as Consulting Physician (General Surgery) Glendale Chard, DO as Consulting Physician (Neurology) Pa, [facility]  Extended Emergency Contact Information Primary Emergency Contact: Proudfoot,[name] Mobile Phone: [phone] Relation: Daughter  Code Status:  Full Code  Goals of care: Advanced Directive information    01/09/2023    9:33 AM  Advanced Directives  Does Patient Have a Medical Advance Directive? Yes  Type of Advance Directive Living will  Does patient want to make changes to medical advance directive? Yes (Inpatient - patient defers changing a medical advance directive at this time - Information given)     Chief Complaint  Patient presents with   Medical Management of Chronic Issues    Patient present today for a 6 month follow-up   Quality Metric Gaps    Urine microalbumin, foot exam, COVID#5    HPI:  Pt is a 77 y.o. female seen today for 6 medical management of chronic diseases. Recent lab work reviewed and discussed during the visit.   Type 2 DM -  CBG 90's -130's  Had cataract surgery in oct,2024 currently prednisone eye drops for another one month. Has upcoming appointment with podiatrist in one week. Has numbness,stiffness and pain on the feet and legs.   Bilateral Knee pain - osteoarthritis.  Left shoulder -request referral to orthopedic.   OSA - had sleep study but states had palpitation so she stopped. Has no CPAP machine.    Past Medical History:  Diagnosis Date   Abnormal Pap smear 05/08/2011   ASC-US +HPV   Arthritis    Asthma    Constipation    Diabetes mellitus type 2 with neurological  manifestations (HCC)    Diabetic peripheral neuropathy (HCC)    Generalized osteoarthritis    GERD (gastroesophageal reflux disease)    Hx of seasonal allergies    Hyperlipidemia    Hypertension    IBS (irritable bowel syndrome)    S/P total hysterectomy 06/21/2011   Past Surgical History:  Procedure Laterality Date   CATARACT EXTRACTION Left    EYE SURGERY Right 01/2018   Removed raised area on eye. Dr.Groat    OVARIAN CYST SURGERY  1986   PELVIC LAPAROSCOPY     TONSILECTOMY, ADENOIDECTOMY, BILATERAL MYRINGOTOMY AND TUBES  1974   VAGINAL HYSTERECTOMY  1983    Allergies  Allergen Reactions   Aspirin    Pravastatin Sodium Other (See Comments)    Muscle aches   Restasis [Cyclosporine] Other (See Comments)   Lisinopril Other (See Comments)    Blood pressures low and causes constipation    Allergies as of 03/20/2023       Reactions   Aspirin    Pravastatin Sodium Other (See Comments)   Muscle aches   Restasis [cyclosporine] Other (See Comments)   Lisinopril Other (See Comments)   Blood pressures low and causes constipation        Medication List        Accurate as of March 20, 2023 10:06 AM. If you have any questions, ask your nurse or doctor.          Accu-Chek Aviva Plus test strip Generic drug: glucose blood USE TO TEST BLOOD SUGAR TWICE DAILY.  Accu-Chek Aviva Plus w/Device Kit Use to test blood sugar twice daily. Dx E11.42   Accu-Chek Softclix Lancet Dev Kit Use to test blood sugar. Dx: E11.42   Accu-Chek Softclix Lancets lancets USE TO TEST BLOOD SUGAR ONE TIME DAILY FOR DIABETES.   acetaminophen 500 MG tablet Commonly known as: TYLENOL Take 1 tablet (500 mg total) by mouth every 8 (eight) hours as needed for moderate pain.   Align 4 MG Caps Take 1 capsule by mouth daily.   amLODipine 5 MG tablet Commonly known as: NORVASC TAKE 1 TABLET EVERY DAY   b complex vitamins capsule Take 1 capsule by mouth daily.   cholecalciferol 1000 units  tablet Commonly known as: VITAMIN D Take 1,000 Units by mouth daily.   DropSafe Alcohol Prep 70 % Pads USE TO TEST BLOOD SUGAR TWICE DAILY.   Fluad Quadrivalent 0.5 ML injection Generic drug: influenza vaccine adjuvanted   fluticasone 50 MCG/ACT nasal spray Commonly known as: FLONASE Place 2 sprays into both nostrils as needed.   latanoprost 0.005 % ophthalmic solution Commonly known as: XALATAN 1 drop at bedtime.   loratadine 10 MG tablet Commonly known as: CLARITIN Take 10 mg by mouth daily as needed.   metFORMIN 500 MG tablet Commonly known as: GLUCOPHAGE TAKE 2 TABLETS EVERY DAY WITH BREAKFAST   metoprolol tartrate 25 MG tablet Commonly known as: LOPRESSOR Take 0.5 tablets (12.5 mg total) by mouth 2 (two) times daily as needed (palpitations).   multivitamin-lutein Caps capsule Take 1 capsule by mouth daily.   multivitamin with minerals tablet Take 1 tablet by mouth daily.   ofloxacin 0.3 % ophthalmic solution Commonly known as: OCUFLOX   polyethylene glycol 17 g packet Commonly known as: MIRALAX / GLYCOLAX Take 17 g by mouth daily as needed. For constipation   prednisoLONE acetate 1 % ophthalmic suspension Commonly known as: PRED FORTE SMARTSIG:In Eye(s)   rosuvastatin 5 MG tablet Commonly known as: CRESTOR TAKE 1 TABLET EVERY DAY   SYSTANE COMPLETE OP Place 1 drop into both eyes in the morning, at noon, in the evening, and at bedtime.        Review of Systems  Constitutional:  Negative for appetite change, chills, fatigue, fever and unexpected weight change.  HENT:  Negative for congestion, dental problem, ear discharge, ear pain, facial swelling, hearing loss, nosebleeds, postnasal drip, rhinorrhea, sinus pressure, sinus pain, sneezing, sore throat, tinnitus and trouble swallowing.   Eyes:  Positive for visual disturbance. Negative for pain, discharge, redness and itching.  Respiratory:  Negative for cough, chest tightness, shortness of breath and  wheezing.   Cardiovascular:  Negative for chest pain, palpitations and leg swelling.  Gastrointestinal:  Negative for abdominal distention, abdominal pain, blood in stool, constipation, diarrhea, nausea and vomiting.  Endocrine: Negative for cold intolerance, heat intolerance, polydipsia, polyphagia and polyuria.  Genitourinary:  Negative for difficulty urinating, dysuria, flank pain, frequency and urgency.  Musculoskeletal:  Positive for arthralgias and gait problem. Negative for back pain, joint swelling, myalgias, neck pain and neck stiffness.  Skin:  Negative for color change, pallor, rash and wound.  Neurological:  Negative for dizziness, syncope, speech difficulty, weakness, light-headedness, numbness and headaches.  Hematological:  Does not bruise/bleed easily.  Psychiatric/Behavioral:  Negative for agitation, behavioral problems, confusion, hallucinations, self-injury, sleep disturbance and suicidal ideas. The patient is not nervous/anxious.     Immunization History  Administered Date(s) Administered   Fluad Quad(high Dose 65+) 02/11/2019, 02/12/2020, 01/27/2021   Fluad Trivalent(High Dose 65+) 01/09/2023   Influenza,  High Dose Seasonal PF 01/28/2018, 01/23/2022   Influenza,inj,Quad PF,6+ Mos 02/03/2016, 01/23/2017   Moderna Sars-Covid-2 Vaccination 06/22/2019, 07/24/2019, 03/24/2020   PFIZER(Purple Top)SARS-COV-2 Vaccination 04/26/2021   Pneumococcal Conjugate-13 07/30/2014   Pneumococcal Polysaccharide-23 11/17/2005, 05/20/2012   Td 08/18/2010, 02/16/2021   Tdap 02/16/2021   Pertinent  Health Maintenance Due  Topic Date Due   FOOT EXAM  01/02/2023   OPHTHALMOLOGY EXAM  06/26/2023   HEMOGLOBIN A1C  09/16/2023   Colonoscopy  03/08/2031   INFLUENZA VACCINE  Completed   DEXA SCAN  Completed      05/30/2022    1:06 PM 08/07/2022    1:58 PM 09/18/2022    8:50 AM 01/09/2023    9:33 AM 03/20/2023    9:51 AM  Fall Risk  Falls in the past year? 0 0 0 0 0  Was there an injury with  Fall? 0 0 0  0  Fall Risk Category Calculator 0 0 0  0  Patient at Risk for Falls Due to History of fall(s) No Fall Risks   No Fall Risks  Fall risk Follow up  Falls evaluation completed Falls evaluation completed  Falls evaluation completed   Functional Status Survey:    Vitals:   03/20/23 0955  BP: (!) 126/58  Pulse: 77  Resp: 20  Temp: 97.8 F (36.6 C)  SpO2: 98%  Weight: 151 lb 9.6 oz (68.8 kg)  Height: 5\' 6"  (1.676 m)   Body mass index is 24.47 kg/m. Physical Exam Vitals reviewed.  Constitutional:      General: She is not in acute distress.    Appearance: Normal appearance. She is normal weight. She is not ill-appearing or diaphoretic.  HENT:     Head: Normocephalic.     Right Ear: Tympanic membrane, ear canal and external ear normal. There is no impacted cerumen.     Left Ear: Tympanic membrane, ear canal and external ear normal. There is no impacted cerumen.     Nose: Nose normal. No congestion or rhinorrhea.     Mouth/Throat:     Mouth: Mucous membranes are moist.     Pharynx: Oropharynx is clear. No oropharyngeal exudate or posterior oropharyngeal erythema.  Eyes:     General: No scleral icterus.       Right eye: No discharge.        Left eye: No discharge.     Extraocular Movements: Extraocular movements intact.     Conjunctiva/sclera: Conjunctivae normal.     Pupils: Pupils are equal, round, and reactive to light.  Neck:     Vascular: No carotid bruit.  Cardiovascular:     Rate and Rhythm: Normal rate and regular rhythm.     Pulses: Normal pulses.     Heart sounds: Normal heart sounds. No murmur heard.    No friction rub. No gallop.  Pulmonary:     Effort: Pulmonary effort is normal. No respiratory distress.     Breath sounds: Normal breath sounds. No wheezing, rhonchi or rales.  Chest:     Chest wall: No tenderness.  Abdominal:     General: Bowel sounds are normal. There is no distension.     Palpations: Abdomen is soft. There is no mass.      Tenderness: There is no abdominal tenderness. There is no right CVA tenderness, left CVA tenderness, guarding or rebound.  Musculoskeletal:        General: No swelling or tenderness. Normal range of motion.     Cervical back: Normal range of  motion. No rigidity or tenderness.     Right lower leg: No edema.     Left lower leg: No edema.  Lymphadenopathy:     Cervical: No cervical adenopathy.  Skin:    General: Skin is warm and dry.     Coloration: Skin is not pale.     Findings: No bruising, erythema, lesion or rash.  Neurological:     Mental Status: She is alert and oriented to person, place, and time.     Cranial Nerves: No cranial nerve deficit.     Sensory: No sensory deficit.     Motor: No weakness.     Coordination: Coordination normal.     Gait: Gait normal.  Psychiatric:        Mood and Affect: Mood normal.        Speech: Speech normal.        Behavior: Behavior normal.        Thought Content: Thought content normal.        Judgment: Judgment normal.    Labs reviewed: Recent Labs    08/01/22 0819 03/18/23 0808  NA 142 140  K 4.3 4.4  CL 105 104  CO2 30 29  GLUCOSE 106* 123*  BUN 19 24  CREATININE 0.97 0.92  CALCIUM 9.4 9.3   Recent Labs    08/01/22 0819 03/18/23 0808  AST 17 18  ALT 11 14  BILITOT 0.5 0.4  PROT 7.0 7.2   Recent Labs    08/01/22 0819 03/18/23 0808  WBC 4.9 5.2  NEUTROABS 1,872 1,862  HGB 13.6 13.2  HCT 40.8 40.9  MCV 85.2 83.8  PLT 161 179   Lab Results  Component Value Date   TSH 1.69 03/18/2023   Lab Results  Component Value Date   HGBA1C 7.4 (H) 03/18/2023   Lab Results  Component Value Date   CHOL 179 03/18/2023   HDL 75 03/18/2023   LDLCALC 81 03/18/2023   TRIG 125 03/18/2023   CHOLHDL 2.4 03/18/2023    Significant Diagnostic Results in last 30 days:  No results found.  Assessment/Plan 1. Type 2 diabetes mellitus with diabetic polyneuropathy, without long-term current use of insulin (HCC) Lab Results   Component Value Date   HGBA1C 7.4 (H) 03/18/2023  - continue on metformin  - check CBG daily an as needed for hypo/hyperglycemia  - Microalbumin / creatinine urine ratio - Lipid panel; Future - TSH; Future - COMPLETE METABOLIC PANEL WITH GFR; Future - CBC with Differential/Platelet; Future - Hemoglobin A1c; Future  2. Hypertension  B/p well controlled  - continue dietary modification and exercise at least three times per week for 30 minutes.  - continue on amlodipine and metoprolol  - COMPLETE METABOLIC PANEL WITH GFR; Future - CBC with Differential/Platelet; Future  3. Hyperlipidemia  LDL at goal  - continue on Rosuvastatin  - continue on dietary modification and exercise  - Lipid panel; Future  4. Gastroesophageal reflux disease without esophagitis Symptoms controlled. H/H stable.No tarry or black stool  - advised to avoid eating meals late in the evening and to avoid aggravating foods and spices. - continue on Omeprazole  - COMPLETE METABOLIC PANEL WITH GFR; Future - CBC with Differential/Platelet; Future  5. Chronic left shoulder pain Chronic  Request referral to orthopedic for cortisol injection  Request muscle relaxant which has been effective for bilateral shoulder - neck area muscle tightness  - Ambulatory referral to Orthopedic Surgery - cyclobenzaprine (FLEXERIL) 5 MG tablet; Take 1 tablet (5  mg total) by mouth 3 (three) times daily as needed for muscle spasms.  Dispense: 30 tablet; Refill: 1  Family/ staff Communication: Reviewed plan of care with patient verbalized understanding   Labs/tests ordered:  - Microalbumin / creatinine urine ratio - Lipid panel; Future - TSH; Future - COMPLETE METABOLIC PANEL WITH GFR; Future - CBC with Differential/Platelet; Future - Hemoglobin A1c; Future  Next Appointment : Return in about 6 months (around 09/18/2023) for medical mangement of chronic issues., Fasting labs in 6 months prior to visit.   Caesar Bookman,  NP

## 2023-03-21 LAB — MICROALBUMIN / CREATININE URINE RATIO
Creatinine, Urine: 145 mg/dL (ref 20–275)
Microalb Creat Ratio: 4 mg/g{creat} (ref <30)
Microalb, Ur: 0.6 mg/dL

## 2023-03-27 ENCOUNTER — Ambulatory Visit (INDEPENDENT_AMBULATORY_CARE_PROVIDER_SITE_OTHER): Payer: Medicare HMO | Admitting: Podiatry

## 2023-03-27 ENCOUNTER — Encounter: Payer: Self-pay | Admitting: Podiatry

## 2023-03-27 VITALS — Ht 66.0 in | Wt 151.6 lb

## 2023-03-27 DIAGNOSIS — B351 Tinea unguium: Secondary | ICD-10-CM

## 2023-03-27 DIAGNOSIS — E119 Type 2 diabetes mellitus without complications: Secondary | ICD-10-CM | POA: Diagnosis not present

## 2023-03-27 DIAGNOSIS — Z0189 Encounter for other specified special examinations: Secondary | ICD-10-CM

## 2023-03-27 DIAGNOSIS — M79676 Pain in unspecified toe(s): Secondary | ICD-10-CM

## 2023-03-27 DIAGNOSIS — L84 Corns and callosities: Secondary | ICD-10-CM | POA: Diagnosis not present

## 2023-03-27 DIAGNOSIS — E1142 Type 2 diabetes mellitus with diabetic polyneuropathy: Secondary | ICD-10-CM

## 2023-03-28 ENCOUNTER — Ambulatory Visit: Payer: Medicare HMO | Admitting: Orthopedic Surgery

## 2023-04-03 NOTE — Progress Notes (Signed)
Subjective:  Patient ID: Sierra Bosque V., female    DOB: [birth year],  MRN: [id number]  77 y.o. female presents for annual diabetic foot examination. Chief Complaint  Patient presents with   Nail Problem    Pt is here for [facility], last A1C was 6.7 PCP is Dr Elam Dutch and LOV was last week.    New problem(s): None   PCP is [name], [name] C, NP.  Allergies  Allergen Reactions   Aspirin    Pravastatin Sodium Other (See Comments)    Muscle aches   Restasis [Cyclosporine] Other (See Comments)   Lisinopril Other (See Comments)    Blood pressures low and causes constipation    Review of Systems: Negative except as noted in the HPI.   Objective:  Sierra Morello V. is a pleasant 77 y.o. female WD, WN in NAD. AAO x 3.   Diabetic foot exam was performed with the following findings:   Vascular Examination: Capillary refill time immediate b/l. Vascular status intact b/l with palpable pedal pulses. Pedal hair present b/l. No pain with calf compression b/l. Skin temperature gradient WNL b/l. No cyanosis or clubbing b/l. No ischemia or gangrene noted b/l. Trace edema noted BLE.  Neurological Examination: Sensation grossly intact b/l with 10 gram monofilament. Vibratory sensation intact b/l. Pt has subjective symptoms of neuropathy.  Dermatological Examination: Pedal skin with normal turgor, texture and tone b/l.  No open wounds. No interdigital macerations.   Toenails 1-5 b/l thick, discolored, elongated with subungual debris and pain on dorsal palpation.   Hyperkeratotic lesion(s) submet head 1 left foot and submet head 1 right foot.  No erythema, no edema, no drainage, no fluctuance.  Musculoskeletal Examination: Muscle strength 5/5 to all lower extremity muscle groups bilaterally. No pain, crepitus or joint limitation noted with ROM bilateral LE. No gross bony deformities bilaterally.   Radiographs: None     Radiographs: None Last A1c:      Latest Ref Rng & Units  03/18/2023    8:08 AM 08/01/2022    8:19 AM  Hemoglobin A1C  Hemoglobin-A1c <5.7 % of total Hgb 7.4  6.9     Assessment:   1. Pain due to onychomycosis of nail   2. Callus   3. Type 2 diabetes mellitus with diabetic polyneuropathy, without long-term current use of insulin (HCC)   4. Encounter for diabetic foot exam [facility])    Plan:  -Patient was evaluated today. All questions/concerns addressed on today's visit. -Diabetic foot examination performed today. -Continue diabetic foot care principles: inspect feet daily, monitor glucose as recommended by PCP and/or Endocrinologist, and follow prescribed diet per PCP, Endocrinologist and/or dietician. -Patient to continue soft, supportive shoe gear daily. -Mycotic toenails 1-5 bilaterally were debrided in length and girth with sterile nail nippers and dremel without incident. -Callus(es) submet head 1 left foot and submet head 1 right foot pared utilizing sterile scalpel blade without complication or incident. Total number debrided =2. -Patient/POA to call should there be question/concern in the interim.  Return in about 3 months (around 06/25/2023).  Sierra Ray, DPM      [place] LOCATION: [address], [place] [address]  Office [phone]   [facility] LOCATION: [address] [place], [place] [address] Office [phone]

## 2023-04-15 ENCOUNTER — Other Ambulatory Visit: Payer: Self-pay | Admitting: Family

## 2023-04-15 DIAGNOSIS — E1142 Type 2 diabetes mellitus with diabetic polyneuropathy: Secondary | ICD-10-CM

## 2023-04-15 DIAGNOSIS — E1169 Type 2 diabetes mellitus with other specified complication: Secondary | ICD-10-CM

## 2023-04-15 NOTE — Telephone Encounter (Signed)
High Risk Warning Populated when attempting to refill, I will send to Provider for further review 

## 2023-05-02 ENCOUNTER — Other Ambulatory Visit: Payer: Self-pay

## 2023-05-02 ENCOUNTER — Ambulatory Visit: Payer: Medicare HMO | Admitting: Orthopedic Surgery

## 2023-05-02 DIAGNOSIS — M79671 Pain in right foot: Secondary | ICD-10-CM | POA: Diagnosis not present

## 2023-05-02 DIAGNOSIS — M6701 Short Achilles tendon (acquired), right ankle: Secondary | ICD-10-CM

## 2023-05-10 ENCOUNTER — Encounter: Payer: Self-pay | Admitting: Orthopedic Surgery

## 2023-05-10 NOTE — Progress Notes (Signed)
Office Visit Note   Patient: Sierra manager V.           Date of Birth: 14-Dec-1945           MRN: [id number] Visit Date: 05/02/2023              Requested by: Caesar Bookman, NP [address] [place],  [place] [address] PCP: Caesar Bookman, NP  Chief Complaint  Patient presents with   Right Foot - Pain   Left Foot - Pain      HPI: Patient is a 78 year old woman who is seen for initial evaluation for her right foot.  Patient states that she struck the second toe on furniture.  Patient has concerns regarding bunion deformity clawing of her toes and arthritis.  Most recent A1c is 7.4.  Assessment & Plan: Visit Diagnoses:  1. Pain in right foot     Plan: Patient was given instructions and demonstrated Achilles stretching also recommended a stiff soled shoe.  Follow-Up Instructions: No follow-ups on file.   Ortho Exam  Patient is alert, oriented, no adenopathy, well-dressed, normal affect, normal respiratory effort. Examination patient uses a cane for ambulation.  She does have osteoarthritis of the MTP joint of the great toe with range of motion from 0 to 45 degrees of dorsiflexion.  Radiograph shows a previous healed stress fracture of the fourth metatarsal.  Patient has Achilles contracture with dorsiflexion of the ankle to neutral with her knee extended.  Imaging: No results found. No images are attached to the encounter.  Labs: Lab Results  Component Value Date   HGBA1C 7.4 (H) 03/18/2023   HGBA1C 6.9 (H) 08/01/2022   HGBA1C 6.9 (H) 02/05/2022   ESRSEDRATE 14 06/16/2008   LABURIC 5.6 02/14/2010     Lab Results  Component Value Date   ALBUMIN 4.1 07/03/2016   ALBUMIN 4.3 05/27/2015   ALBUMIN 4.2 06/16/2008    Lab Results  Component Value Date   MG 2.2 12/05/2021   No results found for: "VD25OH"  No results found for: "PREALBUMIN"    Latest Ref Rng & Units 03/18/2023    8:08 AM 08/01/2022    8:19 AM 02/05/2022    8:20 AM  CBC EXTENDED  WBC 3.8  - 10.8 Thousand/uL 5.2  4.9  4.5   RBC 3.80 - 5.10 Million/uL 4.88  4.79  5.04   Hemoglobin 11.7 - 15.5 g/dL 13.2  44.0  10.2   HCT 35.0 - 45.0 % 40.9  40.8  43.1   Platelets 140 - 400 Thousand/uL 179  161  172   NEUT# 1,500 - 7,800 cells/uL 1,862  1,872  1,638   Lymph# 850 - 3,900 cells/uL  2,337  2,124      There is no height or weight on file to calculate BMI.  Orders:  Orders Placed This Encounter  Procedures   XR Foot Complete Right   No orders of the defined types were placed in this encounter.    Procedures: No procedures performed  Clinical Data: No additional findings.  ROS:  All other systems negative, except as noted in the HPI. Review of Systems  Objective: Vital Signs: There were no vitals taken for this visit.  Specialty Comments:  No specialty comments available.  PMFS History: Patient Active Problem List   Diagnosis Date Noted   Intussusception of small bowel (HCC) 11/21/2021   Abdominal pain 08/21/2021   Adaptive colitis 08/21/2021   Allergic rhinitis 08/21/2021   Anxiety state 08/21/2021  Cramp of limb 08/21/2021   Diabetes mellitus with polyneuropathy (HCC) 08/21/2021   Dysphonia 08/21/2021   Generalized OA 08/21/2021   Low back pain 08/21/2021   Primary generalized (osteo)arthritis 08/21/2021   Type II diabetes mellitus with neurological manifestations (HCC) 08/21/2021   Hypothyroidism (acquired) 08/10/2021   Claudication (HCC) 08/04/2020   Vitreous floaters, left 04/19/2020   Posterior vitreous detachment, left eye 04/19/2020   Vitreomacular traction, left 03/22/2020   Posterior vitreous detachment of right eye 03/22/2020   Nuclear sclerotic cataract of right eye 03/22/2020   Chronic pain of both knees 08/12/2019   Unsteady gait 08/12/2019   Slow transit constipation 08/12/2019   Uncomplicated asthma 05/10/2018   Atrial tachycardia (HCC) 05/06/2018   Insomnia 05/06/2018   Recurrent major depression (HCC) 05/06/2018   High risk  medication use 07/24/2017   Depression 01/23/2017   Costochondritis 07/25/2016   Varicose veins of both lower extremities with complications 07/25/2016   Short-term memory loss 02/03/2016   Pain in joint, multiple sites 02/03/2016   Chronic seasonal allergic rhinitis due to pollen 02/03/2016   Gastroesophageal reflux disease without esophagitis 07/03/2015   Type 2 diabetes mellitus with diabetic polyneuropathy, without long-term current use of insulin (HCC) 07/03/2015   Hyperlipidemia LDL goal <100 07/03/2015   Benign essential HTN 07/03/2015   Encounter for gynecological examination with abnormal finding 10/01/2014   Mastodynia 10/01/2014   Carpal tunnel syndrome 05/20/2014   Essential hypertension 01/15/2014   Palpitations 01/15/2014   Hyperlipidemia 01/15/2014   DOE (dyspnea on exertion) 01/15/2014   PAC (premature atrial contraction) 01/15/2014   S/P total hysterectomy 06/21/2011   Past Medical History:  Diagnosis Date   Abnormal Pap smear 05/08/2011   ASC-US +HPV   Arthritis    Asthma    Constipation    Diabetes mellitus type 2 with neurological manifestations (HCC)    Diabetic peripheral neuropathy (HCC)    Generalized osteoarthritis    GERD (gastroesophageal reflux disease)    Hx of seasonal allergies    Hyperlipidemia    Hypertension    IBS (irritable bowel syndrome)    S/P total hysterectomy 06/21/2011    Family History  Problem Relation Age of Onset   Diabetes Mother    Cancer Mother        colon    Diabetes Father    Heart disease Father    Pancreatic cancer Sister    Breast cancer Maternal Aunt    Breast cancer Paternal Aunt    Cancer Paternal Grandmother        liver    Breast cancer Cousin    Breast cancer Cousin    Pancreatic cancer Brother     Past Surgical History:  Procedure Laterality Date   CATARACT EXTRACTION Left    EYE SURGERY Right 01/2018   Removed raised area on eye. Dr.Groat    OVARIAN CYST SURGERY  1986   PELVIC LAPAROSCOPY      TONSILECTOMY, ADENOIDECTOMY, BILATERAL MYRINGOTOMY AND TUBES  1974   VAGINAL HYSTERECTOMY  1983   Social History   Occupational History    Employer: RETIRED  Tobacco Use   Smoking status: Never   Smokeless tobacco: Never  Vaping Use   Vaping status: Never Used  Substance and Sexual Activity   Alcohol use: No   Drug use: No   Sexual activity: Not Currently    Birth control/protection: Surgical    Comment: 1st intercourse- 20, partners- 8,

## 2023-05-20 ENCOUNTER — Telehealth: Payer: Self-pay | Admitting: *Deleted

## 2023-05-20 DIAGNOSIS — H2511 Age-related nuclear cataract, right eye: Secondary | ICD-10-CM

## 2023-05-20 NOTE — Telephone Encounter (Signed)
Patient called and stated that she needs a referral to [facility] (Dr. Sallye Lat). Problems with Cataracts.  [employer] told her that she has to have a referral, even though she has been going to him.   Would like a referral placed. Please place referral.

## 2023-05-24 ENCOUNTER — Ambulatory Visit: Payer: Medicare HMO | Admitting: Cardiology

## 2023-07-03 ENCOUNTER — Ambulatory Visit: Payer: Self-pay

## 2023-07-03 ENCOUNTER — Ambulatory Visit: Admitting: Adult Health

## 2023-07-03 ENCOUNTER — Encounter: Payer: Self-pay | Admitting: Adult Health

## 2023-07-03 VITALS — BP 128/64 | HR 107 | Temp 99.3°F | Resp 22 | Ht 66.0 in | Wt 150.8 lb

## 2023-07-03 DIAGNOSIS — J301 Allergic rhinitis due to pollen: Secondary | ICD-10-CM | POA: Diagnosis not present

## 2023-07-03 DIAGNOSIS — I10 Essential (primary) hypertension: Secondary | ICD-10-CM | POA: Diagnosis not present

## 2023-07-03 DIAGNOSIS — K5901 Slow transit constipation: Secondary | ICD-10-CM

## 2023-07-03 DIAGNOSIS — R051 Acute cough: Secondary | ICD-10-CM

## 2023-07-03 DIAGNOSIS — E785 Hyperlipidemia, unspecified: Secondary | ICD-10-CM

## 2023-07-03 DIAGNOSIS — E1142 Type 2 diabetes mellitus with diabetic polyneuropathy: Secondary | ICD-10-CM

## 2023-07-03 DIAGNOSIS — E1169 Type 2 diabetes mellitus with other specified complication: Secondary | ICD-10-CM

## 2023-07-03 LAB — POCT INFLUENZA A/B
Influenza A, POC: NEGATIVE
Influenza B, POC: NEGATIVE

## 2023-07-03 LAB — POC COVID19 BINAXNOW: SARS Coronavirus 2 Ag: NEGATIVE

## 2023-07-03 NOTE — Telephone Encounter (Signed)
 Patient will see Avanell Shackleton today, I will forward message to her as a [place]

## 2023-07-03 NOTE — Progress Notes (Signed)
 [facility]  Provider:  Kenard Gower DNP  Code Status:  Full Code  Goals of Care:     07/03/2023   10:57 AM  Advanced Directives  Does Patient Have a Medical Advance Directive? Yes  Type of Estate agent of [place];Living will;Out of facility DNR (pink MOST or yellow form)  Does patient want to make changes to medical advance directive? No - Patient declined  Copy of Healthcare Power of Attorney in Chart? Yes - validated most recent copy scanned in chart (See row information)     Chief Complaint  Patient presents with   Acute Visit    Patient is being seen for a cough/fever.    Discussed the use of AI scribe software for clinical note transcription with the patient, who gave verbal consent to proceed.  HPI: Patient is a 78 y.o. female seen today for an acute visit for cough and fever.  She has been experiencing a dry cough and fever for approximately two weeks, with her temperature fluctuating between 99.56F and 102F over the past weekend. She has not taken any specific medication for these symptoms, except for Tylenol two days ago. No nasal congestion, but she has a dry and sore throat, which she attributes to her allergies. She has received all COVID-19 vaccines except the latest booster, which she postponed due to feeling unwell.  She has a history of seasonal allergies, managed with daily Claritin and Flonase as needed. Despite this, she experiences occasional nasal congestion, which she manages with saline and Flonase. Currently, she denies nasal congestion but reports a dry and sore throat.  She has a history of constipation, managed with daily Miralax. Recently, she has experienced lower abdominal pain and describes her bowel movements as small and infrequent. She maintains a diet with increased vegetable intake to manage her constipation. No diarrhea.  Over the last few days, she has experienced muscle and joint pains, rating the pain as a 5  out of 10. No serious joint pain.  Her hypertension is managed with amlodipine, and her diabetes is managed with 1000 mg of metformin daily. Her last A1c was 7.4 in December 2024. She monitors her blood sugar daily, with a reading of 130 mg/dL this morning. She also takes rosuvastatin for hyperlipidemia and metoprolol tartrate PRN for palpitations, though she has not experienced palpitations recently.  Her daughter had RSV recently and was very sick for a month. She lives alone and does not cook often. Her neighbor and brother assist with meals. She has four biological children and two adopted children, with some living in [place] and [place].       Past Medical History:  Diagnosis Date   Abnormal Pap smear 05/08/2011   ASC-US +HPV   Arthritis    Asthma    Constipation    Diabetes mellitus type 2 with neurological manifestations (HCC)    Diabetic peripheral neuropathy (HCC)    Generalized osteoarthritis    GERD (gastroesophageal reflux disease)    Hx of seasonal allergies    Hyperlipidemia    Hypertension    IBS (irritable bowel syndrome)    S/P total hysterectomy 06/21/2011    Past Surgical History:  Procedure Laterality Date   CATARACT EXTRACTION Left    EYE SURGERY Right 01/2018   Removed raised area on eye. Dr.Groat    OVARIAN CYST SURGERY  1986   PELVIC LAPAROSCOPY     TONSILECTOMY, ADENOIDECTOMY, BILATERAL MYRINGOTOMY AND TUBES  1974   VAGINAL HYSTERECTOMY  1983    Allergies  Allergen Reactions   Aspirin    Pravastatin Sodium Other (See Comments)    Muscle aches   Restasis [Cyclosporine] Other (See Comments)   Lisinopril Other (See Comments)    Blood pressures low and causes constipation    Outpatient Encounter Medications as of 07/03/2023  Medication Sig   ACCU-CHEK AVIVA PLUS test strip TEST BLOOD SUGAR TWICE DAILY   Accu-Chek Softclix Lancets lancets USE TO TEST BLOOD SUGAR ONE TIME DAILY FOR DIABETES.   acetaminophen (TYLENOL) 500 MG tablet Take 1 tablet  (500 mg total) by mouth every 8 (eight) hours as needed for moderate pain.   Alcohol Swabs (DROPSAFE ALCOHOL PREP) 70 % PADS USE TO TEST BLOOD SUGAR TWICE DAILY.   amLODipine (NORVASC) 5 MG tablet TAKE 1 TABLET EVERY DAY   b complex vitamins capsule Take 1 capsule by mouth daily.   Blood Glucose Monitoring Suppl (ACCU-CHEK AVIVA PLUS) w/Device KIT Use to test blood sugar twice daily. Dx E11.42   cholecalciferol (VITAMIN D) 1000 UNITS tablet Take 1,000 Units by mouth daily.   cyclobenzaprine (FLEXERIL) 5 MG tablet Take 1 tablet (5 mg total) by mouth 3 (three) times daily as needed for muscle spasms.   FLUAD QUADRIVALENT 0.5 ML injection    fluticasone (FLONASE) 50 MCG/ACT nasal spray Place 2 sprays into both nostrils as needed.    Lancets Misc. (ACCU-CHEK SOFTCLIX LANCET DEV) KIT Use to test blood sugar. Dx: E11.42   latanoprost (XALATAN) 0.005 % ophthalmic solution 1 drop at bedtime.   loratadine (CLARITIN) 10 MG tablet Take 10 mg by mouth daily as needed.   metFORMIN (GLUCOPHAGE) 500 MG tablet TAKE 2 TABLETS EVERY DAY WITH BREAKFAST   metoprolol tartrate (LOPRESSOR) 25 MG tablet Take 0.5 tablets (12.5 mg total) by mouth 2 (two) times daily as needed (palpitations).   Multiple Vitamins-Minerals (MULTIVITAMIN WITH MINERALS) tablet Take 1 tablet by mouth daily.   multivitamin-lutein (OCUVITE-LUTEIN) CAPS capsule Take 1 capsule by mouth daily.   ofloxacin (OCUFLOX) 0.3 % ophthalmic solution    polyethylene glycol (MIRALAX / GLYCOLAX) packet Take 17 g by mouth daily as needed. For constipation   prednisoLONE acetate (PRED FORTE) 1 % ophthalmic suspension SMARTSIG:In Eye(s)   Probiotic Product (ALIGN) 4 MG CAPS Take 1 capsule by mouth daily.    Propylene Glycol (SYSTANE COMPLETE OP) Place 1 drop into both eyes in the morning, at noon, in the evening, and at bedtime.   rosuvastatin (CRESTOR) 5 MG tablet TAKE 1 TABLET EVERY DAY   No facility-administered encounter medications on file as of 07/03/2023.     Review of Systems:  Review of Systems  Constitutional:  Positive for fatigue and fever. Negative for appetite change and chills.  HENT:  Positive for sore throat. Negative for congestion, hearing loss and rhinorrhea.   Eyes: Negative.   Respiratory:  Positive for cough. Negative for shortness of breath and wheezing.   Cardiovascular:  Negative for chest pain, palpitations and leg swelling.  Gastrointestinal:  Negative for abdominal pain, constipation, diarrhea, nausea and vomiting.  Genitourinary:  Negative for dysuria.  Musculoskeletal:  Negative for arthralgias, back pain and myalgias.  Skin:  Negative for color change, rash and wound.  Neurological:  Negative for dizziness, weakness and headaches.  Psychiatric/Behavioral:  Negative for behavioral problems. The patient is not nervous/anxious.     Health Maintenance  Topic Date Due   OPHTHALMOLOGY EXAM  06/26/2023   COVID-19 Vaccine (5 - 2024-25 season) 07/18/2023 (Originally 12/16/2022)   HEMOGLOBIN  A1C  09/16/2023   Medicare Annual Wellness (AWV)  01/09/2024   Diabetic kidney evaluation - eGFR measurement  03/17/2024   Diabetic kidney evaluation - Urine ACR  03/19/2024   FOOT EXAM  03/26/2024   DTaP/Tdap/Td (4 - Td or Tdap) 02/17/2031   Pneumonia Vaccine 106+ Years old  Completed   INFLUENZA VACCINE  Completed   DEXA SCAN  Completed   Hepatitis C Screening  Completed   HPV VACCINES  Aged Out   Colonoscopy  Discontinued   Zoster Vaccines- Shingrix  Discontinued    Physical Exam: Vitals:   07/03/23 1007  BP: 128/64  Pulse: (!) 107  Resp: (!) 22  Temp: 99.3 F (37.4 C)  SpO2: 97%  Weight: 150 lb 12.8 oz (68.4 kg)  Height: 5\' 6"  (1.676 m)   Body mass index is 24.34 kg/m. Physical Exam Constitutional:      General: She is not in acute distress.    Appearance: Normal appearance.  HENT:     Head: Normocephalic and atraumatic.     Nose: Nose normal.     Mouth/Throat:     Mouth: Mucous membranes are moist.  Eyes:      Conjunctiva/sclera: Conjunctivae normal.  Cardiovascular:     Rate and Rhythm: Normal rate and regular rhythm.  Pulmonary:     Effort: Pulmonary effort is normal.     Breath sounds: Normal breath sounds.  Abdominal:     General: Bowel sounds are normal.     Palpations: Abdomen is soft.  Musculoskeletal:        General: Normal range of motion.     Cervical back: Normal range of motion.  Skin:    General: Skin is warm and dry.  Neurological:     General: No focal deficit present.     Mental Status: She is alert and oriented to person, place, and time.  Psychiatric:        Mood and Affect: Mood normal.        Behavior: Behavior normal.        Thought Content: Thought content normal.        Judgment: Judgment normal.     Labs reviewed: Basic Metabolic Panel: Recent Labs    08/01/22 0819 03/18/23 0808  NA 142 140  K 4.3 4.4  CL 105 104  CO2 30 29  GLUCOSE 106* 123*  BUN 19 24  CREATININE 0.97 0.92  CALCIUM 9.4 9.3  TSH 1.85 1.69   Liver Function Tests: Recent Labs    08/01/22 0819 03/18/23 0808  AST 17 18  ALT 11 14  BILITOT 0.5 0.4  PROT 7.0 7.2   No results for input(s): "LIPASE", "AMYLASE" in the last 8760 hours. No results for input(s): "AMMONIA" in the last 8760 hours. CBC: Recent Labs    08/01/22 0819 03/18/23 0808  WBC 4.9 5.2  NEUTROABS 1,872 1,862  HGB 13.6 13.2  HCT 40.8 40.9  MCV 85.2 83.8  PLT 161 179   Lipid Panel: Recent Labs    08/01/22 0819 03/18/23 0808  CHOL 129 179  HDL 76 75  LDLCALC 37 81  TRIG 82 125  CHOLHDL 1.7 2.4   Lab Results  Component Value Date   HGBA1C 7.4 (H) 03/18/2023    Procedures since last visit: No results found.  Assessment/Plan  1. Acute cough (Primary) -  Suspected viral infection, likely self-limiting. Prefers natural remedies for cough. - Monitor symptoms, return if fever persists >10 days. - Encourage fluid intake, healthy eating. -  Consider ginger and lemon tea for cough. - POC  COVID-19 negative - POC Influenza A/B negative  2. Chronic seasonal allergic rhinitis due to pollen -  continue Flonase PRN  3. Slow transit constipation -  Chronic constipation managed with Miralax. Reports small, pellet-like stools, occasional abdominal pain. - Continue daily Miralax. - Increase vegetable intake.  4. Essential hypertension -  Hypertension well-controlled with amlodipine  5. Type 2 diabetes mellitus with diabetic polyneuropathy, without long-term current use of insulin (HCC) -  Type 2 diabetes managed with metformin. Last A1c 7.4% in December 2024. Monitors blood sugar daily  6. Hyperlipidemia associated with type 2 diabetes mellitus (HCC) -  Hyperlipidemia managed with rosuvastatin. Last lipid panel normal in December 2024.    General Health Maintenance Plans to get the latest COVID-19 booster when feeling better. Receives annual flu shot. - Encourage getting the latest COVID-19 booster when feeling better.  Follow-up - Reschedule follow-up appointment to an earlier date due to travel plans.        Labs/tests ordered:  POC COVID-19 and POC Influenza A/B     Sierra Caniglia Medina-Vargas, NP

## 2023-07-03 NOTE — Telephone Encounter (Signed)
 Copied from CRM [id number]. Topic: Clinical - Red Word Triage >> Jul 03, 2023  8:11 AM Philippa Chester F wrote: [facility] that prompted transfer to Nurse Triage:   Fever 99-102.7 (latest reading was 102.7 last night) 101.6 this morning Symptoms since last Friday: Fever Cough Chills (feeling very cold)  Chief Complaint: fever Symptoms: dry cough, chills, nasal congestion Frequency: Friday Pertinent Negatives: Patient denies SOB Disposition: [] ED /[] Urgent Care (no appt availability in office) / [x] Appointment(In office/virtual)/ []  [facility]/ [] Home Care/ [] Refused Recommended Disposition /[] [facility]/ []  Follow-up with PCP Additional Notes:   Reason for Disposition  Fever present > 3 days (72 hours)  Answer Assessment - Initial Assessment Questions 1. TEMPERATURE: "What is the most recent temperature?"  "How was it measured?"      102 this am 101.6 2. ONSET: "When did the fever start?"      This am  3. CHILLS: "Do you have chills?" If yes: "How bad are they?"  (e.g., none, mild, moderate, severe)   - NONE: no chills   - MILD: feeling cold   - MODERATE: feeling very cold, some shivering (feels better under a thick blanket)   - SEVERE: feeling extremely cold with shaking chills (general body shaking, rigors; even under a thick blanket)      moderate 4. OTHER SYMPTOMS: "Do you have any other symptoms besides the fever?"  (e.g., abdomen pain, cough, diarrhea, earache, headache, sore throat, urination pain)     cough 5. CAUSE: If there are no symptoms, ask: "What do you think is causing the fever?"      N/a  7. TREATMENT: "What have you done so far to treat this fever?" (e.g., medications)     Tylenol  8. IMMUNOCOMPROMISE: "Do you have of the following: diabetes, HIV positive, splenectomy, cancer chemotherapy, chronic steroid treatment, transplant patient, etc."     Diabetes, heart issue  Protocols used: Fever-A-AH

## 2023-07-04 ENCOUNTER — Telehealth: Payer: Self-pay

## 2023-07-04 ENCOUNTER — Ambulatory Visit: Admitting: Adult Health

## 2023-07-04 NOTE — Telephone Encounter (Signed)
 Copied from CRM [id number]. Topic: Referral - Request for Referral >> Jul 04, 2023  1:00 PM Hamdi H wrote: Did the patient discuss referral with their provider in the last year? No (If No - schedule appointment) (If Yes - send message)  Appointment offered? Yes  Type of order/referral and detailed reason for visit: Needs referral for eye exam to see a specialist due to her diabetes.   Preference of office, provider, location: Dr. Fawn Kirk  If referral order, have you been seen by this specialty before? Yes (If Yes, this issue or another issue? When? Where?  Can we respond through MyChart? No She would prefer a phone call.  Spoke with patient and she said to disgraced the message for the eye referral.

## 2023-07-10 ENCOUNTER — Telehealth: Payer: Self-pay | Admitting: Family

## 2023-07-10 DIAGNOSIS — E1142 Type 2 diabetes mellitus with diabetic polyneuropathy: Secondary | ICD-10-CM

## 2023-07-10 NOTE — Telephone Encounter (Signed)
 [name] please advise.    Copied from CRM [id number]. Topic: Referral - Request for Referral >> Jul 10, 2023 10:02 AM Alvino Blood C wrote: Did the patient discuss referral with their provider in the last year? Yes (If No - schedule appointment) (If Yes - send message)  Appointment offered? No  Type of order/referral and detailed reason for visit:  Diabetic Specialist Dr. Luciana Axe Podiatrist [address] [facility]   Preference of office, provider, location: Diabetic Specialist patient wants to see Dr. Luciana Axe  Patient would like to see Podiatrist on [address]- [facility]  If referral order, have you been seen by this specialty before? Yes (If Yes, this issue or another issue? When? Where?  Can we respond through MyChart? Yes

## 2023-07-11 NOTE — Telephone Encounter (Signed)
Opthalmology referral ordered

## 2023-07-11 NOTE — Telephone Encounter (Signed)
 Referral order was placed dor podiatry, however it appears patient was ask for 2 referrals  1.) Podiatry  2.) Diabetic specialist, Dr.Rankin  Left message on voicemail for patient to return call when available , reason for call was to clarify @ 2 request. I need to know specifically what department patient is needed to be referred to as Dr.Rakin is an ophthalmologist.  Awaiting return call

## 2023-07-11 NOTE — Telephone Encounter (Signed)
 Patient returned call and stated Dr.Rankin is a eye specialist and although she seen him hr insurance told her that she needs a new referral. Referral is pending for provider to review

## 2023-07-24 ENCOUNTER — Ambulatory Visit: Payer: Medicare HMO | Admitting: Podiatry

## 2023-07-24 ENCOUNTER — Encounter: Payer: Self-pay | Admitting: Podiatry

## 2023-07-24 VITALS — Ht 66.0 in | Wt 150.8 lb

## 2023-07-24 DIAGNOSIS — B351 Tinea unguium: Secondary | ICD-10-CM

## 2023-07-24 DIAGNOSIS — M79609 Pain in unspecified limb: Secondary | ICD-10-CM

## 2023-07-24 DIAGNOSIS — E1142 Type 2 diabetes mellitus with diabetic polyneuropathy: Secondary | ICD-10-CM | POA: Diagnosis not present

## 2023-07-24 DIAGNOSIS — L84 Corns and callosities: Secondary | ICD-10-CM

## 2023-07-28 NOTE — Progress Notes (Signed)
 Subjective:  Patient ID: Sierra Tarte V., female    DOB: [birth year],  MRN: [id number]  78 y.o. female presents at risk foot care with history of diabetic neuropathy and callus(es) b/l feet and painful mycotic toenails that are difficult to trim. Painful toenails interfere with ambulation. Aggravating factors include wearing enclosed shoe gear. Pain is relieved with periodic professional debridement. Painful calluses are aggravated when weightbearing with and without shoegear. Pain is relieved with periodic professional debridement.  Chief Complaint  Patient presents with   Nail Problem    Pt is here for [facility] last A1C was 7.4 PCP is Dr Arlis Lakes and LOV was in December.   New problem(s): None   PCP is [name], [name] C, NP.  Allergies  Allergen Reactions   Aspirin    Pravastatin Sodium Other (See Comments)    Muscle aches   Restasis [Cyclosporine] Other (See Comments)   Lisinopril Other (See Comments)    Blood pressures low and causes constipation    Review of Systems: Negative except as noted in the HPI.   Objective:  Sierra Ostrum V. is a pleasant 78 y.o. female WD, WN in NAD. AAO x 3.  Vascular Examination: Vascular status intact b/l with palpable pedal pulses. CFT immediate b/l. Pedal hair present. No edema. No pain with calf compression b/l. Skin temperature gradient WNL b/l. No varicosities noted. No cyanosis or clubbing noted.  Neurological Examination: Pt has subjective symptoms of neuropathy. Sensation grossly intact b/l with 10 gram monofilament. Vibratory sensation intact b/l.  Dermatological Examination: Pedal skin with normal turgor, texture and tone b/l. No open wounds nor interdigital macerations noted. Toenails 1-5 b/l thick, discolored, elongated with subungual debris and pain on dorsal palpation.Hyperkeratotic lesion(s) bilateral great toes and submet head 1 left foot.  No erythema, no edema, no drainage, no fluctuance.  Musculoskeletal  Examination: Muscle strength 5/5 to b/l LE.  No pain, crepitus noted b/l. No gross pedal deformities. Patient ambulates independently without assistive aids.   Radiographs: None Last A1c:      Latest Ref Rng & Units 03/18/2023    8:08 AM 08/01/2022    8:19 AM  Hemoglobin A1C  Hemoglobin-A1c <5.7 % of total Hgb 7.4  6.9      Assessment:   1. Pain due to onychomycosis of nail   2. Callus   3. Type 2 diabetes mellitus with diabetic polyneuropathy, without long-term current use of insulin (HCC)    Plan:  Consent given for treatment. Patient examined.All patient's and/or POA's questions/concerns addressed on today's visit. Toenails 1-5 debrided in length and girth without incident. Callus(es) bilateral great toes and submet head 1 left foot pared with sharp debridement without incident. Continue foot and shoe inspections daily. Monitor blood glucose per PCP/Endocrinologist's recommendations.Continue soft, supportive shoe gear daily. Report any pedal injuries to medical professional. Call office if there are any questions/concerns. -Patient/POA to call should there be question/concern in the interim.  Return in about 3 months (around 10/23/2023).  Sierra Ray, DPM      [place] LOCATION: [address], [place] [address]                   Office [phone]   [place] LOCATION: [phone]  [facility] [place], [place] [address] Office [phone]

## 2023-08-12 DIAGNOSIS — H401130 Primary open-angle glaucoma, bilateral, stage unspecified: Secondary | ICD-10-CM | POA: Diagnosis not present

## 2023-08-12 DIAGNOSIS — H43813 Vitreous degeneration, bilateral: Secondary | ICD-10-CM | POA: Diagnosis not present

## 2023-08-12 DIAGNOSIS — H43392 Other vitreous opacities, left eye: Secondary | ICD-10-CM | POA: Diagnosis not present

## 2023-08-12 LAB — HM DIABETES EYE EXAM

## 2023-08-21 IMAGING — CT CT ABD-PELV W/ CM
2 of 5 series · 13 of 46 positions shown, 15 images · IV contrast (agent unspecified)
Comparison: CT with IV and oral contrast 03/17/2013

CLINICAL DATA: Abdominal pain and distention.

EXAM:
CT ABDOMEN AND PELVIS WITH CONTRAST
TECHNIQUE: Multidetector CT imaging of the abdomen and pelvis was performed
using the standard protocol following bolus administration of
intravenous contrast.

[Series 2: abd pelvis 5.00 br40 s3 axial · axial · 0.66mm/px · z∈[+1155,+1570]mm · 10 of 93 slices shown, 12 images]
[im 5/93  soft-tissue]
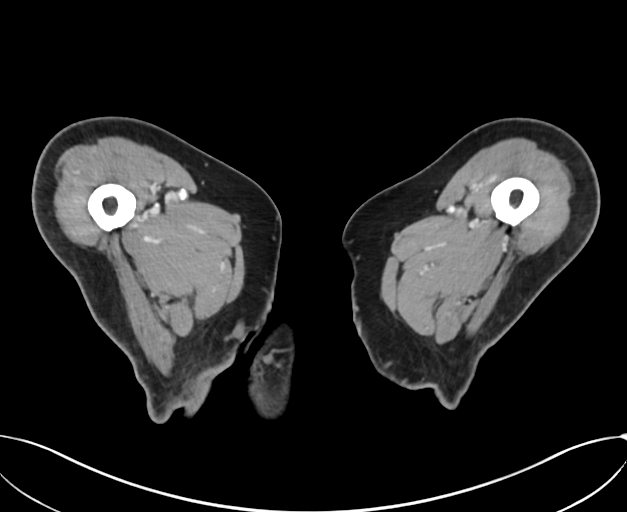
[im 5/93  bone]
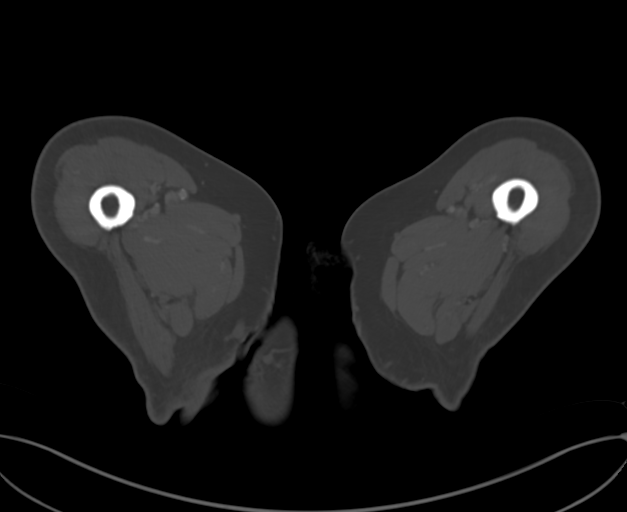
[im 14/93  soft-tissue]
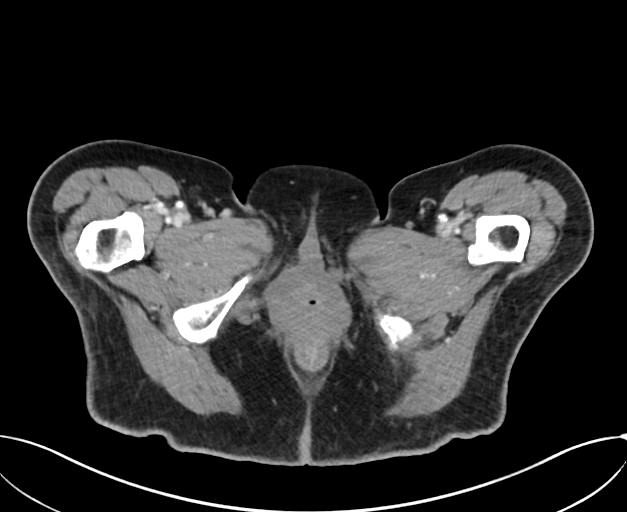
[im 24/93  soft-tissue]
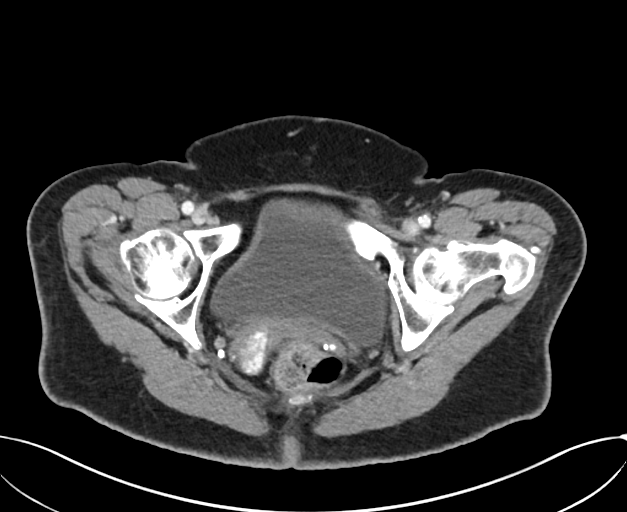
[im 33/93  soft-tissue]
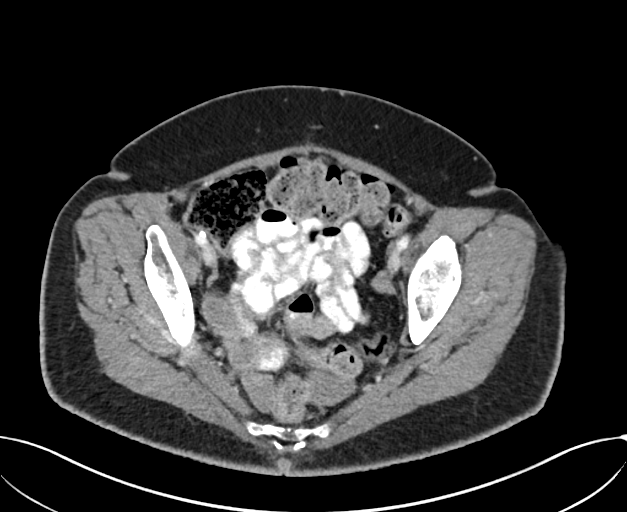
[im 42/93  soft-tissue]
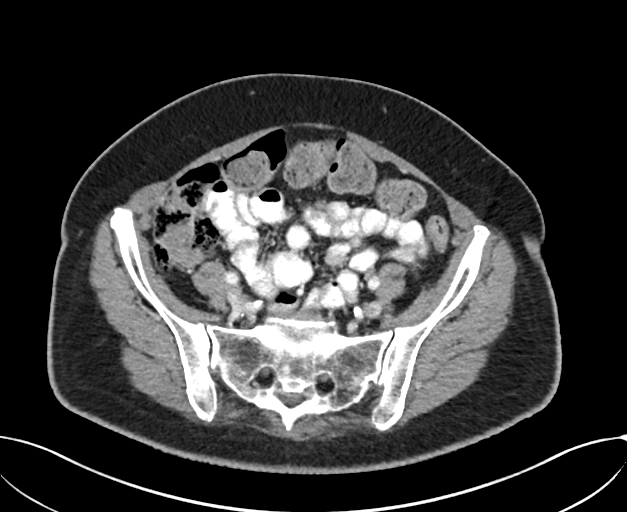
[im 51/93  soft-tissue]
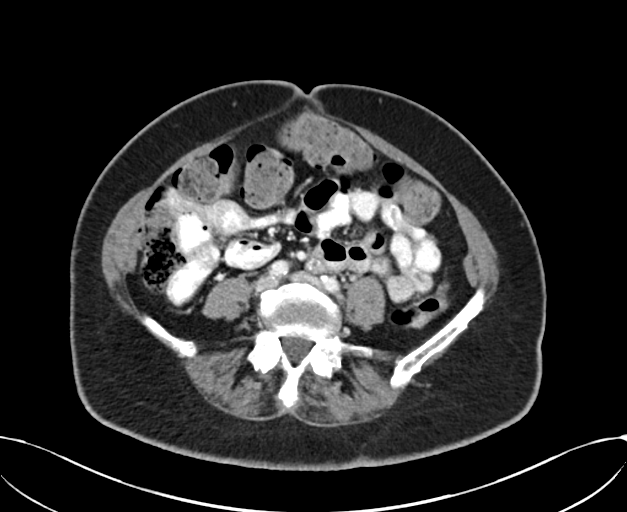
[im 60/93  soft-tissue]
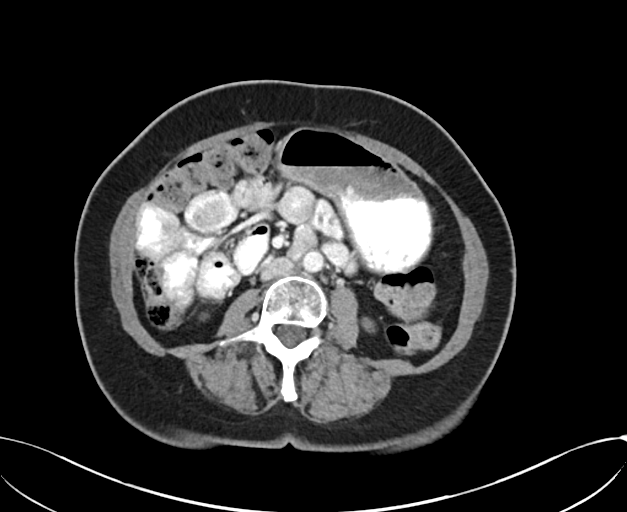
[im 70/93  soft-tissue]
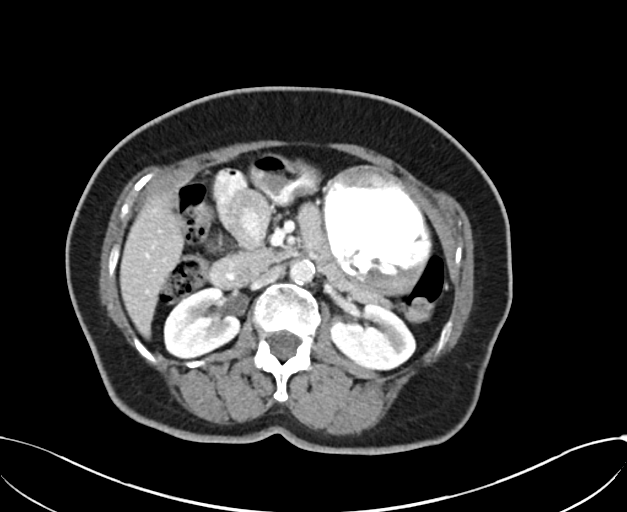
[im 79/93  soft-tissue]
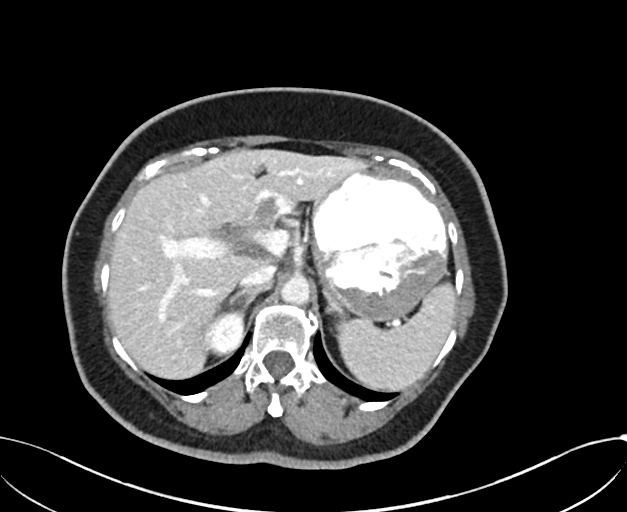
[im 79/93  bone]
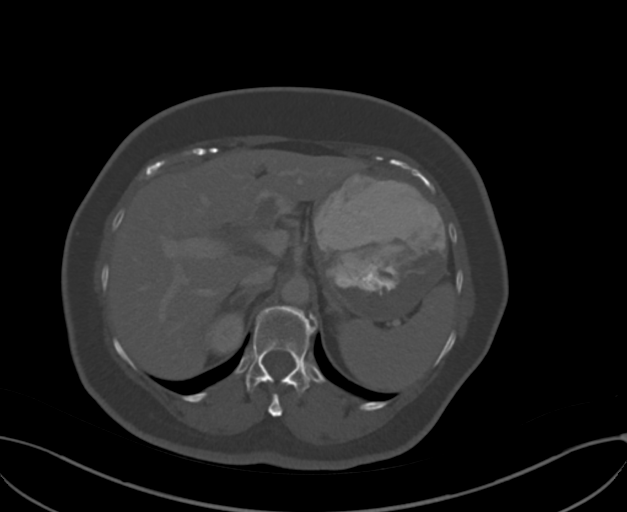
[im 88/93  soft-tissue]
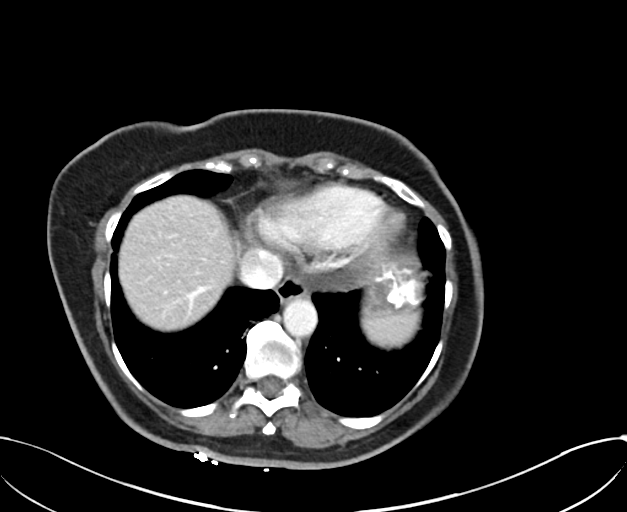

[Series 6: abd pelvis 2.00 br40 s3 cor · coronal · 0.75mm/px · 3 of 146 slices shown]
[im 49/146  soft-tissue]
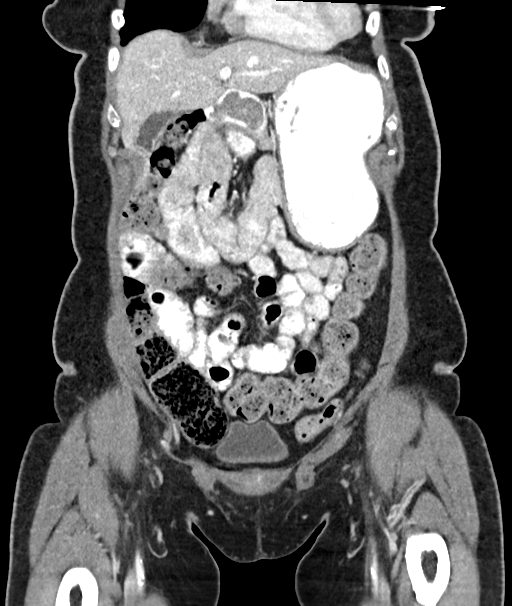
[im 65/146  soft-tissue]
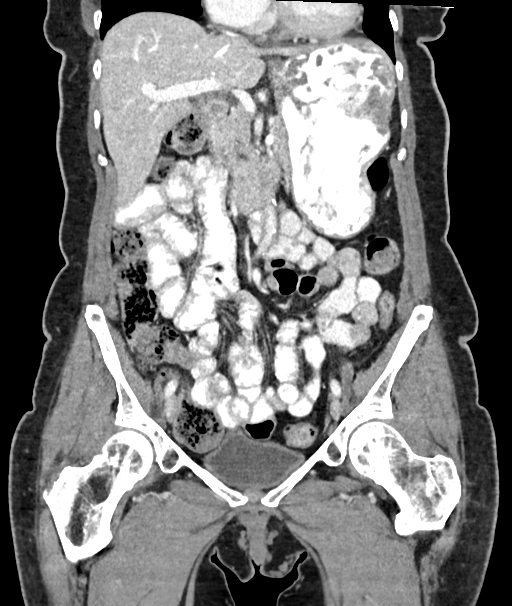
[im 81/146  soft-tissue]
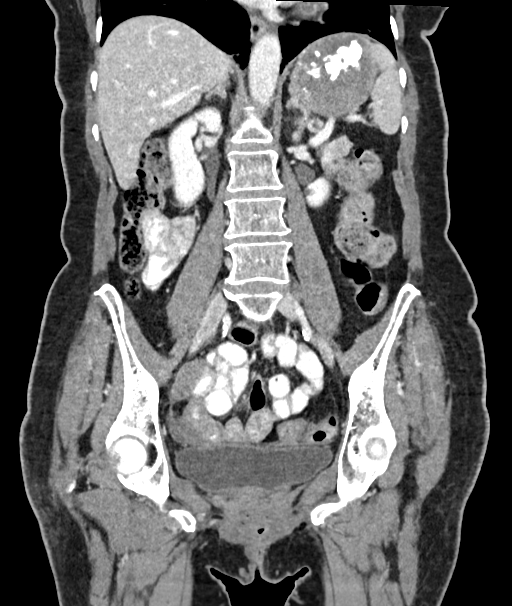

[13 of 46 positions shown; findings below may reference images not displayed]

RADIATION DOSE REDUCTION: This exam was performed according to the
departmental dose-optimization program which includes automated
exposure control, adjustment of the mA and/or kV according to
patient size and/or use of iterative reconstruction technique.

CONTRAST:  100mL [id number] IOPAMIDOL ([id number]) INJECTION 61%
FINDINGS: Lower chest: Right lower lobe paraspinal scarring. Lung bases are
otherwise clear. The cardiac size is normal. There is no pericardial
effusion.

Hepatobiliary: The liver is mildly steatotic and otherwise
unremarkable. Gallbladder and bile ducts are unremarkable.

Pancreas: No focal abnormality or ductal dilatation.

Spleen: No splenomegaly or focal mass.

Adrenals/Urinary Tract: Slight chronic nodular thickening again
noted left adrenal gland, stable. No right adrenal mass. There is a
1 cm cyst in the superior pole right kidney 15.2 Hounsfield units
which was not seen previously.

There are additional too small to characterize hypodensities in both
kidneys. There is no mass enhancement calculus or obstructive
uropathy. The bladder is unremarkable for the degree of distention.

Stomach/Bowel: The stomach is distended with contrast and food
products. There is a normal wall thickness.

In the right mid abdomen there is a 6.6 cm in length telescoping of
the small bowel (series 2 axial 33-40, series 6, coronal
reconstruction images 51-55). The small bowel otherwise fills
normally with no evidence of obstruction or inflammation.

An appendix is not seen in this patient. There is moderate to severe
retained stool in ascending and transverse colon, left colonic
diverticula without evidence of focal colitis or diverticulitis.

Vascular/Lymphatic: Aortic atherosclerosis. No enlarged abdominal or
pelvic lymph nodes.

Reproductive: Status post hysterectomy.  No adnexal mass is seen.

Other: Small umbilical fat hernia. There is no free air, hemorrhage
or fluid. Scattered pelvic phleboliths.

Musculoskeletal: There is mild osteopenia and degenerative change of
the spine. There is spurring of the SI joints without ankylosis.
Mild hip arthrosis. No worrisome skeletal lesion.
IMPRESSION: 1. Moderate to severe stool retention. No bowel obstruction or
inflammation. Diverticulosis.
2. Stomach distended with contrast and food substrate, could be due
to a recent ingestion or impaired gastric emptying.
3. 6.6 cm in length nonobstructive right mid abdominal small bowel
intussusception. Follow-up small bowel series or short interval
follow-up CT with oral contrast recommended to see if this persists,
and would consider consultation with a surgeon. There is no wall
thickening, upstream obstruction, pneumatosis or mesenteric edema
associated with this.
4. Umbilical fat hernia.
5. Mild hepatic steatosis.
6. A few too small to characterize renal cortical hypodensities,
most likely cysts.
7. Aortic atherosclerosis.

## 2023-09-17 ENCOUNTER — Other Ambulatory Visit: Payer: Medicare HMO

## 2023-09-20 ENCOUNTER — Ambulatory Visit: Payer: Medicare HMO | Admitting: Family

## 2023-10-15 DIAGNOSIS — Z961 Presence of intraocular lens: Secondary | ICD-10-CM | POA: Diagnosis not present

## 2023-10-15 DIAGNOSIS — H401132 Primary open-angle glaucoma, bilateral, moderate stage: Secondary | ICD-10-CM | POA: Diagnosis not present

## 2023-10-15 DIAGNOSIS — H26491 Other secondary cataract, right eye: Secondary | ICD-10-CM | POA: Diagnosis not present

## 2023-10-17 ENCOUNTER — Other Ambulatory Visit

## 2023-10-17 DIAGNOSIS — E1142 Type 2 diabetes mellitus with diabetic polyneuropathy: Secondary | ICD-10-CM | POA: Diagnosis not present

## 2023-10-17 DIAGNOSIS — E785 Hyperlipidemia, unspecified: Secondary | ICD-10-CM | POA: Diagnosis not present

## 2023-10-17 DIAGNOSIS — I10 Essential (primary) hypertension: Secondary | ICD-10-CM

## 2023-10-17 DIAGNOSIS — K219 Gastro-esophageal reflux disease without esophagitis: Secondary | ICD-10-CM | POA: Diagnosis not present

## 2023-10-17 LAB — COMPREHENSIVE METABOLIC PANEL WITH GFR
AG Ratio: 1.3 (calc) (ref 1.0–2.5)
ALT: 12 U/L (ref 6–29)
AST: 18 U/L (ref 10–35)
Albumin: 4.1 g/dL (ref 3.6–5.1)
Alkaline phosphatase (APISO): 62 U/L (ref 37–153)
BUN: 21 mg/dL (ref 7–25)
CO2: 30 mmol/L (ref 20–32)
Calcium: 9.3 mg/dL (ref 8.6–10.4)
Chloride: 101 mmol/L (ref 98–110)
Creat: 0.89 mg/dL (ref 0.60–1.00)
Globulin: 3.1 g/dL (ref 1.9–3.7)
Glucose, Bld: 125 mg/dL — ABNORMAL HIGH (ref 65–99)
Potassium: 4 mmol/L (ref 3.5–5.3)
Sodium: 139 mmol/L (ref 135–146)
Total Bilirubin: 0.4 mg/dL (ref 0.2–1.2)
Total Protein: 7.2 g/dL (ref 6.1–8.1)
eGFR: 67 mL/min/{1.73_m2} (ref >=60)

## 2023-10-17 LAB — TSH: TSH: 1.86 m[IU]/L (ref 0.40–4.50)

## 2023-10-17 LAB — CBC WITH DIFFERENTIAL/PLATELET
Absolute Lymphocytes: 2135 {cells}/uL (ref 850–3900)
Absolute Monocytes: 730 {cells}/uL (ref 200–950)
Basophils Absolute: 40 {cells}/uL (ref 0–200)
Basophils Relative: 0.8 %
Eosinophils Absolute: 200 {cells}/uL (ref 15–500)
Eosinophils Relative: 4 %
HCT: 40.7 % (ref 35.0–45.0)
Hemoglobin: 13 g/dL (ref 11.7–15.5)
MCH: 27.1 pg (ref 27.0–33.0)
MCHC: 31.9 g/dL — ABNORMAL LOW (ref 32.0–36.0)
MCV: 84.8 fL (ref 80.0–100.0)
MPV: 9.4 fL (ref 7.5–12.5)
Monocytes Relative: 14.6 %
Neutro Abs: 1895 {cells}/uL (ref 1500–7800)
Neutrophils Relative %: 37.9 %
Platelets: 168 10*3/uL (ref 140–400)
RBC: 4.8 10*6/uL (ref 3.80–5.10)
RDW: 14.3 % (ref 11.0–15.0)
Total Lymphocyte: 42.7 %
WBC: 5 10*3/uL (ref 3.8–10.8)

## 2023-10-17 LAB — HEMOGLOBIN A1C
Hgb A1c MFr Bld: 7.8 % — ABNORMAL HIGH (ref <5.7)
Mean Plasma Glucose: 177 mg/dL
eAG (mmol/L): 9.8 mmol/L

## 2023-10-17 LAB — LIPID PANEL
Cholesterol: 152 mg/dL (ref <200)
HDL: 77 mg/dL (ref >=50)
LDL Cholesterol (Calc): 55 mg/dL
Non-HDL Cholesterol (Calc): 75 mg/dL (ref <130)
Total CHOL/HDL Ratio: 2 (calc) (ref <5.0)
Triglycerides: 117 mg/dL (ref <150)

## 2023-10-18 ENCOUNTER — Ambulatory Visit: Payer: Self-pay | Admitting: Adult Health

## 2023-10-18 NOTE — Progress Notes (Signed)
-     No anemia - A1c 7.8, up from 7.4 (03/18/2023), continue current medications, discuss on 10/23/2023 during visit -Electrolytes and liver enzymes normal -TSH normal

## 2023-10-21 ENCOUNTER — Ambulatory Visit: Admitting: Family

## 2023-10-22 NOTE — Progress Notes (Addendum)
 Location:  [facility]  POS: Clinic  Provider: Tawni America, ANP   Goals of Care:     07/03/2023   10:57 AM  Advanced Directives  Does Patient Have a Medical Advance Directive? Yes  Type of Estate agent of [place];Living will;Out of facility DNR (pink MOST or yellow form)  Does patient want to make changes to medical advance directive? No - Patient declined  Copy of Healthcare Power of Attorney in Chart? Yes - validated most recent copy scanned in chart (See row information)     Chief Complaint  Patient presents with   Follow-up    HPI: Discussed the use of AI scribe software for clinical note transcription with the patient, who gave verbal consent to proceed.  History of Present Illness   The patient, with diabetes, presents with left shoulder pain and diabetes management.  They have been experiencing left shoulder pain for several weeks, radiating to the arm and elbow, and sometimes extending to the neck. The pain is severe enough to limit hand movement and disrupt sleep on the affected side. They have been using icy hot patches, ice, and occasionally Tylenol  for relief. A previous fall three years ago affected the right shoulder, which was successfully treated with an injection by an orthopedic doctor.  Denies chest pain with rest or exertion. Did have some burning in the chest area the past two days. Gone now.   They have a history of diabetes with increasing A1c levels, the most recent being 7.8. They attribute this to reduced physical activity over the past six months due to pain and discomfort from a corn on their foot caused by diabetic shoes. They have been mostly sedentary, watching TV, and are currently taking metformin  500 mg, two tablets with breakfast, and Crestor  5 mg for cholesterol management.  They report issues with constipation, using Miralax twice daily, although they sometimes forget the second dose. They experience occasional abdominal  soreness but no urinary problems.  They had eye surgery in October of the previous year and have been experiencing problems with the right eye, requiring prednisone  treatment for four months. They are scheduled for a laser procedure next month to address ongoing issues.        Type 2 DM A1C going up Follow up ophthalmology and podiatry  HTN: controlled   Hx of Asthma no issues    HLD controlled   Mammogram: neg 12/05/22 Past Medical History:  Diagnosis Date   Abnormal Pap smear 05/08/2011   ASC-US  +HPV   Arthritis    Asthma    Constipation    Diabetes mellitus type 2 with neurological manifestations (HCC)    Diabetic peripheral neuropathy (HCC)    Generalized osteoarthritis    GERD (gastroesophageal reflux disease)    Hx of seasonal allergies    Hyperlipidemia    Hypertension    IBS (irritable bowel syndrome)    S/P total hysterectomy 06/21/2011    Past Surgical History:  Procedure Laterality Date   CATARACT EXTRACTION Left    EYE SURGERY Right 01/2018   Removed raised area on eye. Dr.Groat    OVARIAN CYST SURGERY  1986   PELVIC LAPAROSCOPY     TONSILECTOMY, ADENOIDECTOMY, BILATERAL MYRINGOTOMY AND TUBES  1974   VAGINAL HYSTERECTOMY  1983    Allergies  Allergen Reactions   Aspirin    Pravastatin  Sodium Other (See Comments)    Muscle aches   Restasis [Cyclosporine] Other (See Comments)   Lisinopril  Other (See Comments)  Blood pressures low and causes constipation    Outpatient Encounter Medications as of 10/23/2023  Medication Sig   ACCU-CHEK AVIVA PLUS test strip TEST BLOOD SUGAR TWICE DAILY   Accu-Chek Softclix Lancets lancets USE TO TEST BLOOD SUGAR ONE TIME DAILY FOR DIABETES.   acetaminophen  (TYLENOL ) 500 MG tablet Take 1 tablet (500 mg total) by mouth every 8 (eight) hours as needed for moderate pain.   Alcohol Swabs (DROPSAFE ALCOHOL PREP) 70 % PADS USE TO TEST BLOOD SUGAR TWICE DAILY.   amLODipine  (NORVASC ) 5 MG tablet TAKE 1 TABLET EVERY DAY   b  complex vitamins capsule Take 1 capsule by mouth daily.   Blood Glucose Monitoring Suppl (ACCU-CHEK AVIVA PLUS) w/Device KIT Use to test blood sugar twice daily. Dx E11.42   cholecalciferol (VITAMIN D) 1000 UNITS tablet Take 1,000 Units by mouth daily.   cyclobenzaprine  (FLEXERIL ) 5 MG tablet Take 1 tablet (5 mg total) by mouth 3 (three) times daily as needed for muscle spasms.   FLUAD QUADRIVALENT 0.5 ML injection    fluticasone  (FLONASE ) 50 MCG/ACT nasal spray Place 2 sprays into both nostrils as needed.    Lancets Misc. (ACCU-CHEK SOFTCLIX LANCET DEV) KIT Use to test blood sugar. Dx: E11.42   latanoprost (XALATAN) 0.005 % ophthalmic solution 1 drop at bedtime.   loratadine (CLARITIN) 10 MG tablet Take 10 mg by mouth daily as needed.   Magnesium 250 MG TABS Take by mouth.   metFORMIN  (GLUCOPHAGE ) 500 MG tablet TAKE 2 TABLETS EVERY DAY WITH BREAKFAST   metoprolol  tartrate (LOPRESSOR ) 25 MG tablet Take 0.5 tablets (12.5 mg total) by mouth 2 (two) times daily as needed (palpitations).   Multiple Vitamins-Minerals (MULTIVITAMIN WITH MINERALS) tablet Take 1 tablet by mouth daily.   multivitamin-lutein (OCUVITE-LUTEIN) CAPS capsule Take 1 capsule by mouth daily.   polyethylene glycol (MIRALAX / GLYCOLAX) packet Take 17 g by mouth daily as needed. For constipation   Probiotic Product (ALIGN) 4 MG CAPS Take 1 capsule by mouth daily.    Propylene Glycol (SYSTANE COMPLETE OP) Place 1 drop into both eyes in the morning, at noon, in the evening, and at bedtime.   rosuvastatin  (CRESTOR ) 5 MG tablet TAKE 1 TABLET EVERY DAY   zinc gluconate 50 MG tablet Take 50 mg by mouth daily.   ofloxacin (OCUFLOX) 0.3 % ophthalmic solution  (Patient not taking: Reported on 10/23/2023)   prednisoLONE acetate (PRED FORTE) 1 % ophthalmic suspension SMARTSIG:In Eye(s) (Patient not taking: Reported on 10/23/2023)   No facility-administered encounter medications on file as of 10/23/2023.    Review of Systems:  Review of Systems   Constitutional:  Negative for activity change, appetite change, chills, diaphoresis, fatigue, fever and unexpected weight change.  HENT:  Negative for congestion.   Respiratory:  Negative for cough, shortness of breath and wheezing.   Cardiovascular:  Negative for chest pain, palpitations and leg swelling.  Gastrointestinal:  Positive for constipation. Negative for abdominal distention, abdominal pain and diarrhea.  Genitourinary:  Negative for difficulty urinating and dysuria.  Musculoskeletal:  Negative for arthralgias, back pain, gait problem, joint swelling and myalgias.       Foot pain due to corn Left shoulder pain with motion  Neurological:  Negative for dizziness, tremors, seizures, syncope, facial asymmetry, speech difficulty, weakness, light-headedness, numbness and headaches.  Psychiatric/Behavioral:  Negative for agitation, behavioral problems and confusion.     Health Maintenance  Topic Date Due   COVID-19 Vaccine (5 - 2024-25 season) 12/16/2022   INFLUENZA VACCINE  11/15/2023  Medicare Annual Wellness (AWV)  01/09/2024   Diabetic kidney evaluation - Urine ACR  03/19/2024   FOOT EXAM  03/26/2024   HEMOGLOBIN A1C  04/18/2024   OPHTHALMOLOGY EXAM  08/11/2024   Diabetic kidney evaluation - eGFR measurement  10/16/2024   DTaP/Tdap/Td (4 - Td or Tdap) 02/17/2031   Pneumococcal Vaccine: 50+ Years  Completed   DEXA SCAN  Completed   Hepatitis C Screening  Completed   Hepatitis B Vaccines  Aged Out   HPV VACCINES  Aged Out   Meningococcal B Vaccine  Aged Out   Colonoscopy  Discontinued   Zoster Vaccines- Shingrix  Discontinued    Physical Exam: Vitals:   10/23/23 0909  BP: 120/74  Pulse: 75  Temp: 97.7 F (36.5 C)  SpO2: 97%  Weight: 152 lb 12.8 oz (69.3 kg)  Height: 5' 6 (1.676 m)   Body mass index is 24.66 kg/m. Wt Readings from Last 3 Encounters:  10/23/23 152 lb 12.8 oz (69.3 kg)  07/24/23 150 lb 12.8 oz (68.4 kg)  07/03/23 150 lb 12.8 oz (68.4 kg)      Physical Exam Constitutional:      General: She is not in acute distress.    Appearance: She is not diaphoretic.  HENT:     Head: Normocephalic and atraumatic.     Right Ear: Tympanic membrane normal.     Left Ear: Tympanic membrane normal.     Nose: Nose normal.     Mouth/Throat:     Mouth: Mucous membranes are moist.     Pharynx: Oropharynx is clear.  Eyes:     Conjunctiva/sclera: Conjunctivae normal.     Pupils: Pupils are equal, round, and reactive to light.  Neck:     Vascular: No JVD.  Cardiovascular:     Rate and Rhythm: Normal rate and regular rhythm.     Heart sounds: No murmur heard. Pulmonary:     Effort: Pulmonary effort is normal. No respiratory distress.     Breath sounds: Normal breath sounds. No wheezing.  Abdominal:     General: Bowel sounds are normal. There is no distension.     Palpations: Abdomen is soft.     Tenderness: There is no abdominal tenderness.  Musculoskeletal:     Cervical back: No rigidity or tenderness.     Right lower leg: No edema.     Left lower leg: No edema.  Lymphadenopathy:     Cervical: No cervical adenopathy.  Skin:    General: Skin is warm and dry.  Neurological:     Mental Status: She is alert and oriented to person, place, and time.  Psychiatric:        Mood and Affect: Mood normal.     Labs reviewed: Basic Metabolic Panel: Recent Labs    03/18/23 0808 10/17/23 0820  NA 140 139  K 4.4 4.0  CL 104 101  CO2 29 30  GLUCOSE 123* 125*  BUN 24 21  CREATININE 0.92 0.89  CALCIUM  9.3 9.3  TSH 1.69 1.86   Liver Function Tests: Recent Labs    03/18/23 0808 10/17/23 0820  AST 18 18  ALT 14 12  BILITOT 0.4 0.4  PROT 7.2 7.2   No results for input(s): LIPASE, AMYLASE in the last 8760 hours. No results for input(s): AMMONIA in the last 8760 hours. CBC: Recent Labs    03/18/23 0808 10/17/23 0820  WBC 5.2 5.0  NEUTROABS 1,862 1,895  HGB 13.2 13.0  HCT 40.9 40.7  MCV  83.8 84.8  PLT 179 168   Lipid  Panel: Recent Labs    03/18/23 0808 10/17/23 0820  CHOL 179 152  HDL 75 77  LDLCALC 81 55  TRIG 125 117  CHOLHDL 2.4 2.0   Lab Results  Component Value Date   HGBA1C 7.8 (H) 10/17/2023    Procedures since last visit: No results found.  Assessment/Plan Assessment and Plan    Left Shoulder Pain Chronic pain with radiation, likely rotator cuff pathology. Prednisone  avoided due to diabetes. Previous injections effective for right shoulder. - Refer to orthopedic specialist for possible injection therapy.  Type 2 Diabetes Mellitus A1c increased to 7.8%, indicating poor glycemic control. Sedentary lifestyle due to pain may contribute. Prefers lifestyle changes over medication increase. - Encourage physical activity 3-5 days a week for at least 30 minutes per day. - Advise dietary modifications to lower carbohydrate and sugar intake. - Recheck A1c in 3 months.  Constipation Chronic constipation with inconsistent Miralax use causing discomfort. - Advise consistent use of Miralax twice daily.  General Health Maintenance Vaccinations up to date. Mammogram due in August, bone density screening due now. - Order mammogram. - Order bone density screening.  Follow-up Necessary to monitor diabetes management and overall health. - Schedule follow-up appointment in 3 months. - Order lab work prior to the next appointment.     HLD Continue crestor      Labs/tests ordered:  * No order type specified * BMP A1C prior to apt Next appt:  01/14/2024   Total time :  time greater than 50% of total time spent doing pt counseling and coordination of care

## 2023-10-23 ENCOUNTER — Encounter: Payer: Self-pay | Admitting: Adult Health

## 2023-10-23 ENCOUNTER — Ambulatory Visit: Admitting: Adult Health

## 2023-10-23 VITALS — BP 120/74 | HR 75 | Temp 97.7°F | Ht 66.0 in | Wt 152.8 lb

## 2023-10-23 DIAGNOSIS — M25512 Pain in left shoulder: Secondary | ICD-10-CM

## 2023-10-23 DIAGNOSIS — E785 Hyperlipidemia, unspecified: Secondary | ICD-10-CM

## 2023-10-23 DIAGNOSIS — Z1231 Encounter for screening mammogram for malignant neoplasm of breast: Secondary | ICD-10-CM

## 2023-10-23 DIAGNOSIS — E2839 Other primary ovarian failure: Secondary | ICD-10-CM

## 2023-10-23 DIAGNOSIS — E1142 Type 2 diabetes mellitus with diabetic polyneuropathy: Secondary | ICD-10-CM

## 2023-10-23 DIAGNOSIS — I10 Essential (primary) hypertension: Secondary | ICD-10-CM

## 2023-10-23 NOTE — Addendum Note (Signed)
 Addended by: DARLEAN TAWNI CROME on: 10/23/2023 01:04 PM   Modules accepted: Level of Service

## 2023-10-23 NOTE — Patient Instructions (Signed)
 Recommend activity 3-5 times a week for  Reduce carbs, sweets  F/u 3 months

## 2023-11-01 ENCOUNTER — Ambulatory Visit: Admitting: Sports Medicine

## 2023-11-01 ENCOUNTER — Ambulatory Visit: Admitting: Orthopaedic Surgery

## 2023-11-01 ENCOUNTER — Ambulatory Visit (INDEPENDENT_AMBULATORY_CARE_PROVIDER_SITE_OTHER): Payer: Self-pay

## 2023-11-01 ENCOUNTER — Other Ambulatory Visit: Payer: Self-pay

## 2023-11-01 ENCOUNTER — Encounter: Payer: Self-pay | Admitting: Orthopaedic Surgery

## 2023-11-01 DIAGNOSIS — M25512 Pain in left shoulder: Secondary | ICD-10-CM

## 2023-11-01 MED ORDER — BUPIVACAINE HCL 0.25 % IJ SOLN
2.0000 mL | INTRAMUSCULAR | Status: AC | PRN
Start: 2023-11-01 — End: 2023-11-01
  Administered 2023-11-01: 2 mL via INTRA_ARTICULAR

## 2023-11-01 MED ORDER — METHYLPREDNISOLONE ACETATE 40 MG/ML IJ SUSP
40.0000 mg | INTRAMUSCULAR | Status: AC | PRN
Start: 2023-11-01 — End: 2023-11-01
  Administered 2023-11-01: 40 mg via INTRA_ARTICULAR

## 2023-11-01 MED ORDER — LIDOCAINE HCL 1 % IJ SOLN
2.0000 mL | INTRAMUSCULAR | Status: AC | PRN
Start: 2023-11-01 — End: 2023-11-01
  Administered 2023-11-01: 2 mL

## 2023-11-01 NOTE — Progress Notes (Signed)
 Office Visit Note   Patient: Sierra manager V.           Date of Birth: 1945-08-30           MRN: [id number] Visit Date: 11/01/2023              Requested by: Darlean Maus, NP [address] [place],  [place] [address] PCP: Leonarda Roxan BROCKS, NP   Assessment & Plan: Visit Diagnoses:  1. Acute pain of left shoulder     Plan: History of Present Illness Sierra Stogdill V. is a 78 year old female with diabetes who presents with left shoulder pain. She was referred by her doctor for evaluation of her shoulder pain.  She experiences worsening pain in the left shoulder over the past few weeks, primarily in the anterior aspect and radiating into the biceps. There is no numbness, tingling, or pain extending into the hand. She also has neck pain near the collarbone. There are no recent injuries or changes in activity. A previous episode of shoulder pain occurred three years ago on the opposite side, treated with an injection. Her diabetes is controlled, though her last A1c was elevated.  Physical Exam MUSCULOSKELETAL: Shoulder flexibility not significantly decreased. Pain on forward flexion, external rotation, and abduction of shoulder. Rotator cuff function intact. Neck tenderness present.  Negative spurling.  Results RADIOLOGY Shoulder X-ray: No acute abnormalities  Assessment and Plan Chronic left shoulder pain Differential includes biceps tendonitis or shoulder arthritis. X-rays show no acute abnormalities. - Refer to Dr. Burnetta for intraarticular cortisone injection in the shoulder joint. - Reassess in six weeks if symptoms persist.  Follow-Up Instructions: Return if symptoms worsen or fail to improve.   Orders:  Orders Placed This Encounter  Procedures   XR Shoulder Left   No orders of the defined types were placed in this encounter.     Procedures: No procedures performed   Clinical Data: No additional findings.   Subjective: Chief Complaint  Patient  presents with   Left Shoulder - Pain    HPI  Review of Systems  Constitutional: Negative.   HENT: Negative.    Eyes: Negative.   Respiratory: Negative.    Cardiovascular: Negative.   Endocrine: Negative.   Musculoskeletal: Negative.   Neurological: Negative.   Hematological: Negative.   Psychiatric/Behavioral: Negative.    All other systems reviewed and are negative.    Objective: Vital Signs: There were no vitals taken for this visit.  Physical Exam Vitals and nursing note reviewed.  Constitutional:      Appearance: She is well-developed.  HENT:     Head: Atraumatic.     Nose: Nose normal.  Eyes:     Extraocular Movements: Extraocular movements intact.  Cardiovascular:     Pulses: Normal pulses.  Pulmonary:     Effort: Pulmonary effort is normal.  Abdominal:     Palpations: Abdomen is soft.  Musculoskeletal:     Cervical back: Neck supple.  Skin:    General: Skin is warm.     Capillary Refill: Capillary refill takes less than 2 seconds.  Neurological:     Mental Status: She is alert. Mental status is at baseline.  Psychiatric:        Behavior: Behavior normal.        Thought Content: Thought content normal.        Judgment: Judgment normal.     Ortho Exam  Specialty Comments:  No specialty comments available.  Imaging: XR Shoulder Left Result  Date: 11/01/2023 X-rays of the left shoulder show no acute or structural abnormalities.      PMFS History: Patient Active Problem List   Diagnosis Date Noted   Intussusception of small bowel (HCC) 11/21/2021   Abdominal pain 08/21/2021   Adaptive colitis 08/21/2021   Allergic rhinitis 08/21/2021   Anxiety state 08/21/2021   Cramp of limb 08/21/2021   Diabetes mellitus with polyneuropathy (HCC) 08/21/2021   Dysphonia 08/21/2021   Generalized OA 08/21/2021   Low back pain 08/21/2021   Primary generalized (osteo)arthritis 08/21/2021   Type II diabetes mellitus with neurological manifestations (HCC)  08/21/2021   Hypothyroidism (acquired) 08/10/2021   Claudication (HCC) 08/04/2020   Vitreous floaters, left 04/19/2020   Posterior vitreous detachment, left eye 04/19/2020   Vitreomacular traction, left 03/22/2020   Posterior vitreous detachment of right eye 03/22/2020   Nuclear sclerotic cataract of right eye 03/22/2020   Chronic pain of both knees 08/12/2019   Unsteady gait 08/12/2019   Slow transit constipation 08/12/2019   Uncomplicated asthma 05/10/2018   Atrial tachycardia (HCC) 05/06/2018   Insomnia 05/06/2018   Recurrent major depression (HCC) 05/06/2018   High risk medication use 07/24/2017   Depression 01/23/2017   Costochondritis 07/25/2016   Varicose veins of both lower extremities with complications 07/25/2016   Short-term memory loss 02/03/2016   Pain in joint, multiple sites 02/03/2016   Chronic seasonal allergic rhinitis due to pollen 02/03/2016   Gastroesophageal reflux disease without esophagitis 07/03/2015   Type 2 diabetes mellitus with diabetic polyneuropathy, without long-term current use of insulin (HCC) 07/03/2015   Hyperlipidemia LDL goal <100 07/03/2015   Benign essential HTN 07/03/2015   Encounter for gynecological examination with abnormal finding 10/01/2014   Mastodynia 10/01/2014   Carpal tunnel syndrome 05/20/2014   Essential hypertension 01/15/2014   Palpitations 01/15/2014   Hyperlipidemia 01/15/2014   DOE (dyspnea on exertion) 01/15/2014   PAC (premature atrial contraction) 01/15/2014   S/P total hysterectomy 06/21/2011   Past Medical History:  Diagnosis Date   Abnormal Pap smear 05/08/2011   ASC-US  +HPV   Arthritis    Asthma    Constipation    Diabetes mellitus type 2 with neurological manifestations (HCC)    Diabetic peripheral neuropathy (HCC)    Generalized osteoarthritis    GERD (gastroesophageal reflux disease)    Hx of seasonal allergies    Hyperlipidemia    Hypertension    IBS (irritable bowel syndrome)    S/P total  hysterectomy 06/21/2011    Family History  Problem Relation Age of Onset   Diabetes Mother    Cancer Mother        colon    Diabetes Father    Heart disease Father    Pancreatic cancer Sister    Breast cancer Maternal Aunt    Breast cancer Paternal Aunt    Cancer Paternal Grandmother        liver    Breast cancer Cousin    Breast cancer Cousin    Pancreatic cancer Brother     Past Surgical History:  Procedure Laterality Date   CATARACT EXTRACTION Left    EYE SURGERY Right 01/2018   Removed raised area on eye. Dr.Groat    OVARIAN CYST SURGERY  1986   PELVIC LAPAROSCOPY     TONSILECTOMY, ADENOIDECTOMY, BILATERAL MYRINGOTOMY AND TUBES  1974   VAGINAL HYSTERECTOMY  1983   Social History   Occupational History    Employer: RETIRED  Tobacco Use   Smoking status: Never   Smokeless tobacco:  Never  Vaping Use   Vaping status: Never Used  Substance and Sexual Activity   Alcohol use: No   Drug use: No   Sexual activity: Not Currently    Birth control/protection: Surgical    Comment: 1st intercourse- 20, partners- 8,

## 2023-11-01 NOTE — Progress Notes (Signed)
   Procedure Note  Patient: Sierra Ray.             Date of Birth: 05-04-1945           MRN: [id number]             Visit Date: 11/01/2023  Procedures: Visit Diagnoses:  1. Acute pain of left shoulder    Large Joint Inj: L glenohumeral on 11/01/2023 8:43 AM Indications: pain Details: 22 G 3.5 in needle, ultrasound-guided posterior approach Medications: 2 mL lidocaine  1 %; 2 mL bupivacaine  0.25 %; 40 mg methylPREDNISolone  acetate 40 MG/ML Outcome: tolerated well, no immediate complications  US -guided glenohumeral joint injection, left shoulder After discussion on risks/benefits/indications, informed verbal consent was obtained. A timeout was then performed. The patient was positioned lying lateral recumbent on examination table. The patient's shoulder was prepped with betadine and multiple alcohol swabs and utilizing ultrasound guidance, the patient's glenohumeral joint was identified on ultrasound. Using ultrasound guidance a 22-gauge, 3.5 inch needle with a mixture of 2:2:1 cc's lidocaine :bupivicaine:depomedrol was directed from a lateral to medial direction via in-plane technique into the glenohumeral joint with visualization of appropriate spread of injectate into the joint. Patient tolerated the procedure well without immediate complications.      Procedure, treatment alternatives, risks and benefits explained, specific risks discussed. Consent was given by the patient. Immediately prior to procedure a time out was called to verify the correct patient, procedure, equipment, support staff and site/side marked as required. Patient was prepped and draped in the usual sterile fashion.     - patient tolerated procedure well, discussed post-injection protocol - follow-up with Dr. Jerri as indicated; I am happy to see them as needed  Lonell Sprang, DO Primary Care Sports Medicine Physician  [facility]  This note was dictated using Dragon naturally speaking  software and may contain errors in syntax, spelling, or content which have not been identified prior to signing this note.

## 2023-12-06 DIAGNOSIS — H26491 Other secondary cataract, right eye: Secondary | ICD-10-CM | POA: Diagnosis not present

## 2023-12-11 ENCOUNTER — Ambulatory Visit
Admission: RE | Admit: 2023-12-11 | Discharge: 2023-12-11 | Disposition: A | Discharge status: Home / Self Care | Source: Ambulatory Visit | Attending: Adult Health

## 2023-12-11 DIAGNOSIS — Z1231 Encounter for screening mammogram for malignant neoplasm of breast: Secondary | ICD-10-CM

## 2023-12-16 ENCOUNTER — Other Ambulatory Visit: Payer: Self-pay | Admitting: Family

## 2023-12-16 DIAGNOSIS — E1142 Type 2 diabetes mellitus with diabetic polyneuropathy: Secondary | ICD-10-CM

## 2023-12-16 DIAGNOSIS — I152 Hypertension secondary to endocrine disorders: Secondary | ICD-10-CM

## 2023-12-23 ENCOUNTER — Ambulatory Visit (INDEPENDENT_AMBULATORY_CARE_PROVIDER_SITE_OTHER): Admitting: Family

## 2023-12-23 ENCOUNTER — Encounter: Payer: Self-pay | Admitting: Family

## 2023-12-23 VITALS — BP 126/76 | HR 97 | Temp 97.6°F | Resp 18 | Ht 66.0 in | Wt 150.8 lb

## 2023-12-23 DIAGNOSIS — R252 Cramp and spasm: Secondary | ICD-10-CM

## 2023-12-23 DIAGNOSIS — R195 Other fecal abnormalities: Secondary | ICD-10-CM | POA: Diagnosis not present

## 2023-12-23 DIAGNOSIS — R1084 Generalized abdominal pain: Secondary | ICD-10-CM

## 2023-12-23 DIAGNOSIS — R1912 Hyperactive bowel sounds: Secondary | ICD-10-CM | POA: Diagnosis not present

## 2023-12-23 NOTE — Patient Instructions (Signed)
-   Please get abdominal X-ray at [facility] then will call you with results.

## 2023-12-23 NOTE — Progress Notes (Signed)
 Provider: Roxan Plough FNP-C  Sierra Ray, Roxan BROCKS, NP  Patient Care Team: Konni Kesinger, Roxan BROCKS, NP as PCP - General (Family Medicine) Hobart Powell BRAVO, MD (Inactive) as PCP - Cardiology (Cardiology) Maranda Leim DEL, MD as Consulting Physician (Cardiology) Rollin Dover, MD as Consulting Physician (Gastroenterology) Sheldon Standing, MD as Consulting Physician (General Surgery) Patel, Donika K, DO as Consulting Physician (Neurology) [facility], [facility]  Extended Emergency Contact Information Primary Emergency Contact: Ralston,[name] Mobile Phone: [phone] Relation: Daughter  Code Status: Full Code  Goals of care: Advanced Directive information    12/23/2023    3:36 PM  Advanced Directives  Does Patient Have a Medical Advance Directive? Yes  Type of Advance Directive Living will  Does patient want to make changes to medical advance directive? No - Patient declined     Chief Complaint  Patient presents with   Referral    Discuss referral for loose stools.    Discussed the use of AI scribe software for clinical note transcription with the patient, who gave verbal consent to proceed.  History of Present Illness   Sierra Furber V. is a 78 year old female who presents with abdominal issues and changes in bowel habits.  She has been experiencing loose stools with a porridge-like consistency since Friday, accompanied by a sensation of stool running out. About a month ago, she noticed passing small, hard stools described as 'little tiny rocks'. No diarrhea, but she mentions a funny feeling in her abdomen and sharp pains around her navel. She takes Miralax daily, sometimes twice a day, and denies using any other stool softeners.  She experiences lower abdominal pain and a burning sensation around her knee. No nausea, vomiting, or excessive gas, but increased burping has been noted over the last two weeks. Her appetite remains unchanged, though she describes it as never  being good, and she eats primarily to take her metformin .  Severe leg cramps occur, particularly after walking or engaging in aquatic exercises, requiring assistance to exit the pool. She does not stretch before exercising. She has not been active due to feeling depressed and spends most of her time sitting or lying down. Attempts to increase activity have been hindered by leg cramps.  She has a history of cataract surgery about a year ago and has discontinued the use of preoperative eye drops. No problems with her vision. She uses metoprolol  as needed for palpitations, though she has not experienced any recently.  Past Medical History:  Diagnosis Date   Abnormal Pap smear 05/08/2011   ASC-US  +HPV   Arthritis    Asthma    Constipation    Diabetes mellitus type 2 with neurological manifestations (HCC)    Diabetic peripheral neuropathy (HCC)    Generalized osteoarthritis    GERD (gastroesophageal reflux disease)    Hx of seasonal allergies    Hyperlipidemia    Hypertension    IBS (irritable bowel syndrome)    S/P total hysterectomy 06/21/2011   Past Surgical History:  Procedure Laterality Date   CATARACT EXTRACTION Left    EYE SURGERY Right 01/2018   Removed raised area on eye. Dr.Groat    OVARIAN CYST SURGERY  1986   PELVIC LAPAROSCOPY     TONSILECTOMY, ADENOIDECTOMY, BILATERAL MYRINGOTOMY AND TUBES  1974   VAGINAL HYSTERECTOMY  1983    Allergies  Allergen Reactions   Aspirin    Pravastatin  Sodium Other (See Comments)    Muscle aches   Restasis [Cyclosporine] Other (See Comments)   Lisinopril   Other (See Comments)    Blood pressures low and causes constipation    Outpatient Encounter Medications as of 12/23/2023  Medication Sig   ACCU-CHEK AVIVA PLUS test strip TEST BLOOD SUGAR TWICE DAILY   Accu-Chek Softclix Lancets lancets USE TO TEST BLOOD SUGAR ONE TIME DAILY FOR DIABETES.   acetaminophen  (TYLENOL ) 500 MG tablet Take 1 tablet (500 mg total) by mouth every 8 (eight)  hours as needed for moderate pain.   Alcohol Swabs (DROPSAFE ALCOHOL PREP) 70 % PADS USE TO TEST BLOOD SUGAR TWICE DAILY.   amLODipine  (NORVASC ) 5 MG tablet TAKE 1 TABLET EVERY DAY   b complex vitamins capsule Take 1 capsule by mouth daily.   Blood Glucose Monitoring Suppl (ACCU-CHEK AVIVA PLUS) w/Device KIT Use to test blood sugar twice daily. Dx E11.42   cholecalciferol (VITAMIN D) 1000 UNITS tablet Take 1,000 Units by mouth daily.   fluticasone  (FLONASE ) 50 MCG/ACT nasal spray Place 2 sprays into both nostrils as needed.    Lancets Misc. (ACCU-CHEK SOFTCLIX LANCET DEV) KIT Use to test blood sugar. Dx: E11.42   latanoprost (XALATAN) 0.005 % ophthalmic solution 1 drop at bedtime.   loratadine (CLARITIN) 10 MG tablet Take 10 mg by mouth daily as needed.   Magnesium 250 MG TABS Take by mouth.   metFORMIN  (GLUCOPHAGE ) 500 MG tablet TAKE 2 TABLETS EVERY DAY WITH BREAKFAST   Multiple Vitamins-Minerals (MULTIVITAMIN WITH MINERALS) tablet Take 1 tablet by mouth daily.   multivitamin-lutein (OCUVITE-LUTEIN) CAPS capsule Take 1 capsule by mouth daily.   polyethylene glycol (MIRALAX / GLYCOLAX) packet Take 17 g by mouth daily as needed. For constipation   Probiotic Product (ALIGN) 4 MG CAPS Take 1 capsule by mouth daily.    Propylene Glycol (SYSTANE COMPLETE OP) Place 1 drop into both eyes in the morning, at noon, in the evening, and at bedtime.   rosuvastatin  (CRESTOR ) 5 MG tablet TAKE 1 TABLET EVERY DAY   zinc gluconate 50 MG tablet Take 50 mg by mouth daily.   metoprolol  tartrate (LOPRESSOR ) 25 MG tablet Take 0.5 tablets (12.5 mg total) by mouth 2 (two) times daily as needed (palpitations). (Patient not taking: Reported on 12/23/2023)   [DISCONTINUED] cyclobenzaprine  (FLEXERIL ) 5 MG tablet Take 1 tablet (5 mg total) by mouth 3 (three) times daily as needed for muscle spasms. (Patient not taking: Reported on 12/23/2023)   [DISCONTINUED] FLUAD QUADRIVALENT 0.5 ML injection  (Patient not taking: Reported on  12/23/2023)   [DISCONTINUED] ofloxacin (OCUFLOX) 0.3 % ophthalmic solution  (Patient not taking: Reported on 12/23/2023)   [DISCONTINUED] prednisoLONE acetate (PRED FORTE) 1 % ophthalmic suspension SMARTSIG:In Eye(s) (Patient not taking: Reported on 12/23/2023)   No facility-administered encounter medications on file as of 12/23/2023.    Review of Systems  Constitutional:  Negative for appetite change, chills, fatigue, fever and unexpected weight change.  HENT:  Negative for congestion, dental problem, ear discharge, ear pain, hearing loss, nosebleeds, postnasal drip, rhinorrhea, sinus pressure, sinus pain, sneezing, sore throat and tinnitus.   Eyes:  Negative for pain, discharge, redness, itching and visual disturbance.  Respiratory:  Negative for cough, chest tightness, shortness of breath and wheezing.   Cardiovascular:  Negative for chest pain, palpitations and leg swelling.  Gastrointestinal:  Positive for abdominal pain. Negative for abdominal distention, blood in stool, constipation, diarrhea, nausea and vomiting.       Loose stool reported with bloating   Genitourinary:  Negative for difficulty urinating, dysuria, flank pain, frequency, urgency and vaginal discharge.  Musculoskeletal:  Negative  for arthralgias, back pain, gait problem and joint swelling.  Skin:  Negative for color change, pallor and rash.  Neurological:  Negative for dizziness, weakness, light-headedness, numbness and headaches.  Hematological:  Does not bruise/bleed easily.  Psychiatric/Behavioral:  Negative for agitation, behavioral problems, confusion, hallucinations and sleep disturbance. The patient is not nervous/anxious.     Immunization History  Administered Date(s) Administered   Fluad Quad(high Dose 65+) 02/11/2019, 02/12/2020, 01/27/2021   Fluad Trivalent(High Dose 65+) 01/09/2023   INFLUENZA, HIGH DOSE SEASONAL PF 01/28/2018, 01/23/2022   Influenza,inj,Quad PF,6+ Mos 02/03/2016, 01/23/2017   Moderna  Sars-Covid-2 Vaccination 06/22/2019, 07/24/2019, 03/24/2020   PFIZER(Purple Top)SARS-COV-2 Vaccination 04/26/2021   Pneumococcal Conjugate-13 07/30/2014   Pneumococcal Polysaccharide-23 11/17/2005, 05/20/2012   Td 08/18/2010, 02/16/2021   Tdap 02/16/2021   Unspecified SARS-COV-2 Vaccination 08/05/2023   Pertinent  Health Maintenance Due  Topic Date Due   Influenza Vaccine  01/22/2024 (Originally 11/15/2023)   FOOT EXAM  03/26/2024   HEMOGLOBIN A1C  04/18/2024   OPHTHALMOLOGY EXAM  08/11/2024   DEXA SCAN  Completed   Colonoscopy  Discontinued      01/09/2023    9:33 AM 03/20/2023    9:51 AM 07/03/2023   10:56 AM 10/23/2023    9:20 AM 12/23/2023    3:35 PM  Fall Risk  Falls in the past year? 0 0 0 0 0  Was there an injury with Fall?  0 0 0 0  Fall Risk Category Calculator  0 0 0 0  Patient at Risk for Falls Due to  No Fall Risks No Fall Risks No Fall Risks No Fall Risks  Fall risk Follow up  Falls evaluation completed Falls evaluation completed Falls evaluation completed Falls evaluation completed   Functional Status Survey:    Vitals:   12/23/23 1541  BP: 126/76  Pulse: 97  Resp: 18  Temp: 97.6 F (36.4 C)  SpO2: 99%  Weight: 150 lb 12.8 oz (68.4 kg)  Height: 5' 6 (1.676 m)   Body mass index is 24.34 kg/m. Physical Exam  GENERAL: Alert, cooperative, well developed, no acute distress. HEENT: Normocephalic, normal oropharynx, moist mucous membranes. CHEST: Clear to auscultation bilaterally, no wheezes, rhonchi, or crackles. No chest wall tenderness. CARDIOVASCULAR: Normal heart rate and rhythm, S1 and S2 normal without murmurs. ABDOMEN: Soft, non-tender, non-distended, without organomegaly. Normal bowel sounds. EXTREMITIES: No cyanosis or edema. NEUROLOGICAL: Cranial nerves grossly intact, moves all extremities without gross motor or sensory deficit.    SKIN: No rash,no lesion or erythema   PSYCHIATRY/BEHAVIORAL: Mood stable    Labs reviewed: Recent Labs     03/18/23 0808 10/17/23 0820  NA 140 139  K 4.4 4.0  CL 104 101  CO2 29 30  GLUCOSE 123* 125*  BUN 24 21  CREATININE 0.92 0.89  CALCIUM  9.3 9.3   Recent Labs    03/18/23 0808 10/17/23 0820  AST 18 18  ALT 14 12  BILITOT 0.4 0.4  PROT 7.2 7.2   Recent Labs    03/18/23 0808 10/17/23 0820  WBC 5.2 5.0  NEUTROABS 1,862 1,895  HGB 13.2 13.0  HCT 40.9 40.7  MCV 83.8 84.8  PLT 179 168   Lab Results  Component Value Date   TSH 1.86 10/17/2023   Lab Results  Component Value Date   HGBA1C 7.8 (H) 10/17/2023   Lab Results  Component Value Date   CHOL 152 10/17/2023   HDL 77 10/17/2023   LDLCALC 55 10/17/2023   TRIG 117 10/17/2023  CHOLHDL 2.0 10/17/2023    Significant Diagnostic Results in last 30 days:  MM 3D SCREENING MAMMOGRAM BILATERAL BREAST Result Date: 12/13/2023 CLINICAL DATA:  Screening. EXAM: DIGITAL SCREENING BILATERAL MAMMOGRAM WITH TOMOSYNTHESIS AND CAD TECHNIQUE: Bilateral screening digital craniocaudal and mediolateral oblique mammograms were obtained. Bilateral screening digital breast tomosynthesis was performed. The images were evaluated with computer-aided detection. COMPARISON:  Previous exam(s). ACR Breast Density Category b: There are scattered areas of fibroglandular density. FINDINGS: There are no findings suspicious for malignancy. IMPRESSION: No mammographic evidence of malignancy. A result letter of this screening mammogram will be mailed directly to the patient. RECOMMENDATION: Screening mammogram in one year. (Code:[id number]) BI-RADS CATEGORY  1: Negative. Electronically Signed   By: Dina  Arceo M.D.   On: 12/13/2023 16:46    Assessment/Plan    Chronic lower abdominal pain with altered bowel habits Chronic lower abdominal pain with altered bowel habits characterized by loose, porridge-like stools for several weeks, with recent exacerbation. No associated nausea or vomiting. Physical examination reveals tenderness in the lower abdomen with  increased bowel sounds, suggesting possible obstruction or other GI issues. - Order abdominal x-ray to assess for obstruction or other abnormalities. - Order CT scan of the abdomen pending insurance approval. - Advise to go to the emergency room if nausea or vomiting develops. - Refer to gastroenterologist for further evaluation.  Recurrent muscle cramps Recurrent muscle cramps, particularly in the legs, exacerbated by physical activity such as walking or aquatic exercises. No pre-exercise stretching performed, contributing to cramping. Symptoms limit physical activity and cause significant discomfort. - Advise on the importance of stretching before and after exercise to prevent cramps. - Encourage gradual increase in physical activity with proper warm-up and cool-down routines.  Blurred vision after cataract surgery Persistent blurred vision following cataract surgery performed approximately one year ago. Previous use of postoperative eye drops including prednisone  and Optipen, which have been discontinued. Recent follow-up with ophthalmologist noted improvement with Claritin use. - Discontinue prednisone  and Optipen eye drops as she is no longer needed postoperatively.     Family/ staff Communication: Reviewed plan of care with patient verbalized understanding   Labs/tests ordered: dg abdomen   Next Appointment: Return if symptoms worsen or fail to improve.  Total time: 20 minutes. Greater than 50% of total time spent doing patient education regarding T2DM,HTN, HLD,chronic back pain,health maintenance including symptom/medication management.   Roxan JAYSON Plough, NP

## 2023-12-24 ENCOUNTER — Ambulatory Visit
Admission: RE | Admit: 2023-12-24 | Discharge: 2023-12-24 | Disposition: A | Discharge status: Home / Self Care | Source: Ambulatory Visit | Attending: Family | Admitting: Family

## 2023-12-24 DIAGNOSIS — R197 Diarrhea, unspecified: Secondary | ICD-10-CM | POA: Diagnosis not present

## 2023-12-27 ENCOUNTER — Ambulatory Visit: Payer: Self-pay | Admitting: Family

## 2023-12-30 DIAGNOSIS — R194 Change in bowel habit: Secondary | ICD-10-CM | POA: Diagnosis not present

## 2023-12-30 DIAGNOSIS — R159 Full incontinence of feces: Secondary | ICD-10-CM | POA: Diagnosis not present

## 2023-12-30 DIAGNOSIS — R1033 Periumbilical pain: Secondary | ICD-10-CM | POA: Diagnosis not present

## 2023-12-30 DIAGNOSIS — R197 Diarrhea, unspecified: Secondary | ICD-10-CM | POA: Diagnosis not present

## 2024-01-10 NOTE — Progress Notes (Signed)
 Sierra Ray                                          MRN: [id number]   01/10/2024   The [facility] Quality Team Specialist reviewed this patient medical record for the purposes of chart review for care gap closure. The following were reviewed: chart review for care gap closure-kidney health evaluation for diabetes:eGFR AND UACR.    [facility] Quality Team

## 2024-01-14 ENCOUNTER — Encounter: Payer: Self-pay | Admitting: Family

## 2024-01-14 ENCOUNTER — Ambulatory Visit (INDEPENDENT_AMBULATORY_CARE_PROVIDER_SITE_OTHER): Payer: Medicare HMO | Admitting: Family

## 2024-01-14 VITALS — BP 128/80 | HR 96 | Temp 97.9°F | Resp 19 | Ht 66.0 in | Wt 151.2 lb

## 2024-01-14 DIAGNOSIS — Z0000 Encounter for general adult medical examination without abnormal findings: Secondary | ICD-10-CM

## 2024-01-14 DIAGNOSIS — Z23 Encounter for immunization: Secondary | ICD-10-CM | POA: Diagnosis not present

## 2024-01-14 NOTE — Progress Notes (Signed)
 Subjective:   Sierra Claar V. is a 78 y.o. female who presents for Medicare Annual (Subsequent) preventive examination.  Visit Complete: In person  Patient Medicare AWV questionnaire was completed by the patient on 01/14/2024; I have confirmed that all information answered by patient is correct and no changes since this date.  Cardiac Risk Factors include: advanced age (>49men, >18 women);diabetes mellitus;hypertension;sedentary lifestyle     Objective:    Today's Vitals   01/14/24 0921  BP: 128/80  Pulse: 96  Resp: 19  Temp: 97.9 F (36.6 C)  SpO2: 98%  Weight: 151 lb 3.2 oz (68.6 kg)  Height: 5' 6 (1.676 m)   Body mass index is 24.4 kg/m.     01/14/2024    9:13 AM 12/23/2023    3:36 PM 07/03/2023   10:57 AM 01/09/2023    9:33 AM 09/18/2022    8:50 AM 02/09/2022   11:07 AM 01/08/2022    8:42 AM  Advanced Directives  Does Patient Have a Medical Advance Directive? Yes Yes Yes Yes Yes Yes Yes  Type of Advance Directive Living will Living will Healthcare Power of [place];Living will;Out of facility DNR (pink MOST or yellow form) Living will Healthcare Power of [place];Living will;Out of facility DNR (pink MOST or yellow form) Healthcare Power of [employer] Power of Attorney  Does patient want to make changes to medical advance directive? No - Patient declined No - Patient declined No - Patient declined Yes (Inpatient - patient defers changing a medical advance directive at this time - Information given)  No - Patient declined No - Patient declined  Copy of Healthcare Power of Attorney in Chart?   Yes - validated most recent copy scanned in chart (See row information)   Yes - validated most recent copy scanned in chart (See row information) Yes - validated most recent copy scanned in chart (See row information)    Current Medications (verified) Outpatient Encounter Medications as of 01/14/2024  Medication Sig   ACCU-CHEK AVIVA PLUS test strip TEST BLOOD SUGAR  TWICE DAILY   Accu-Chek Softclix Lancets lancets USE TO TEST BLOOD SUGAR ONE TIME DAILY FOR DIABETES.   acetaminophen  (TYLENOL ) 500 MG tablet Take 1 tablet (500 mg total) by mouth every 8 (eight) hours as needed for moderate pain.   Alcohol Swabs (DROPSAFE ALCOHOL PREP) 70 % PADS USE TO TEST BLOOD SUGAR TWICE DAILY.   amLODipine  (NORVASC ) 5 MG tablet TAKE 1 TABLET EVERY DAY   b complex vitamins capsule Take 1 capsule by mouth daily.   Blood Glucose Monitoring Suppl (ACCU-CHEK AVIVA PLUS) w/Device KIT Use to test blood sugar twice daily. Dx E11.42   cholecalciferol (VITAMIN D) 1000 UNITS tablet Take 1,000 Units by mouth daily.   fluticasone  (FLONASE ) 50 MCG/ACT nasal spray Place 2 sprays into both nostrils as needed.    Lancets Misc. (ACCU-CHEK SOFTCLIX LANCET DEV) KIT Use to test blood sugar. Dx: E11.42   latanoprost (XALATAN) 0.005 % ophthalmic solution 1 drop at bedtime.   loratadine (CLARITIN) 10 MG tablet Take 10 mg by mouth daily as needed.   metFORMIN  (GLUCOPHAGE ) 500 MG tablet TAKE 2 TABLETS EVERY DAY WITH BREAKFAST   Multiple Vitamins-Minerals (MULTIVITAMIN WITH MINERALS) tablet Take 1 tablet by mouth daily.   multivitamin-lutein (OCUVITE-LUTEIN) CAPS capsule Take 1 capsule by mouth daily.   polyethylene glycol (MIRALAX / GLYCOLAX) packet Take 17 g by mouth daily as needed. For constipation   Probiotic Product (ALIGN) 4 MG CAPS Take 1 capsule by mouth daily.  Propylene Glycol (SYSTANE COMPLETE OP) Place 1 drop into both eyes in the morning, at noon, in the evening, and at bedtime.   rosuvastatin  (CRESTOR ) 5 MG tablet TAKE 1 TABLET EVERY DAY   zinc gluconate 50 MG tablet Take 50 mg by mouth daily.   Magnesium 250 MG TABS Take by mouth. (Patient not taking: Reported on 01/14/2024)   metoprolol  tartrate (LOPRESSOR ) 25 MG tablet Take 0.5 tablets (12.5 mg total) by mouth 2 (two) times daily as needed (palpitations). (Patient not taking: Reported on 01/14/2024)   No facility-administered  encounter medications on file as of 01/14/2024.    Allergies (verified) Aspirin, Pravastatin  sodium, Restasis [cyclosporine], and Lisinopril    History: Past Medical History:  Diagnosis Date   Abnormal Pap smear 05/08/2011   ASC-US  +HPV   Arthritis    Asthma    Constipation    Diabetes mellitus type 2 with neurological manifestations (HCC)    Diabetic peripheral neuropathy (HCC)    Generalized osteoarthritis    GERD (gastroesophageal reflux disease)    Hx of seasonal allergies    Hyperlipidemia    Hypertension    IBS (irritable bowel syndrome)    S/P total hysterectomy 06/21/2011   Past Surgical History:  Procedure Laterality Date   CATARACT EXTRACTION Left    CATARACT EXTRACTION Right 12/25/2022   EYE SURGERY Right 01/2018   Removed raised area on eye. Dr.Groat    OVARIAN CYST SURGERY  1986   PELVIC LAPAROSCOPY     TONSILECTOMY, ADENOIDECTOMY, BILATERAL MYRINGOTOMY AND TUBES  1974   VAGINAL HYSTERECTOMY  1983   Family History  Problem Relation Age of Onset   Diabetes Mother    Cancer Mother        colon    Diabetes Father    Heart disease Father    Pancreatic cancer Sister    Breast cancer Maternal Aunt    Breast cancer Paternal Aunt    Cancer Paternal Grandmother        liver    Breast cancer Cousin    Breast cancer Cousin    Pancreatic cancer Brother    Social History   Socioeconomic History   Marital status: Divorced    Spouse name: Not on file   Number of children: 4   Years of education: Not on file   Highest education level: Not on file  Occupational History    Employer: RETIRED  Tobacco Use   Smoking status: Never   Smokeless tobacco: Never  Vaping Use   Vaping status: Never Used  Substance and Sexual Activity   Alcohol use: No   Drug use: No   Sexual activity: Not Currently    Birth control/protection: Surgical    Comment: 1st intercourse- 20, partners- 8,   Other Topics Concern   Not on file  Social History Narrative   Diet?    Low fat,  low carb, low salt      Do you drink/eat things with caffeine?      Marital status?  single                                  What year were you married?      Do you live in a house, apartment, assisted living, condo, trailer, etc.?      Is it one or more stories?      How many persons live in your home?      Do  you have any pets in your home? (please list)      Current or past profession:      Do you exercise?   yes                 Type & how often? Yoga, water aerobics 1-2x/week      Do you have a living will?      Do you have a DNR form?                                  If not, do you want to discuss one?      Do you have signed POA/HPOA for forms?          Right Handed    Lives in a senior home on the 16th floor.       Social Drivers of Corporate investment banker Strain: Low Risk  (07/24/2017)   Overall Financial Resource Strain (CARDIA)    Difficulty of Paying Living Expenses: Not hard at all  Food Insecurity: No Food Insecurity (01/14/2024)   Hunger Vital Sign    Worried About Running Out of Food in the Last Year: Never true    Ran Out of Food in the Last Year: Never true  Transportation Needs: No Transportation Needs (01/14/2024)   PRAPARE - Administrator, Civil Service (Medical): No    Lack of Transportation (Non-Medical): No  Physical Activity: Sufficiently Active (07/24/2017)   Exercise Vital Sign    Days of Exercise per Week: 3 days    Minutes of Exercise per Session: 50 min  Stress: No Stress Concern Present (07/24/2017)   [employer] of Occupational Health - Occupational Stress Questionnaire    Feeling of Stress : Not at all  Social Connections: Somewhat Isolated (07/24/2017)   Social Connection and Isolation Panel    Frequency of Communication with Friends and Family: More than three times a week    Frequency of Social Gatherings with Friends and Family: More than three times a week    Attends Religious Services: More than 4 times per  year    Active Member of [employer] or Organizations: No    Attends Engineer, structural: Never    Marital Status: Never married    Tobacco Counseling Counseling given: Not Answered   Clinical Intake:  Pre-visit preparation completed: No  Pain : No/denies pain     BMI - recorded: 24.4 Nutritional Status: BMI of 19-24  Normal Nutritional Risks: None Diabetes: Yes CBG done?: No (123 at home) Did pt. bring in CBG monitor from home?: No (runs in the 120's -140's)  How often do you need to have someone help you when you read instructions, pamphlets, or other written materials from your doctor or pharmacy?: 1 - Never What is the last grade level you completed in school?: College 2 yrs  Interpreter Needed?: No      Activities of Daily Living    01/14/2024    9:36 AM  In your present state of health, do you have any difficulty performing the following activities:  Hearing? 0  Vision? 1  Difficulty concentrating or making decisions? 1  Comment making decision  Walking or climbing stairs? 1  Comment uses a cane  Dressing or bathing? 0  Doing errands, shopping? 0  Preparing Food and eating ? Y  Comment Neighbor cooks  Using [employer]? N  In the  past six months, have you accidently leaked urine? Y  Do you have problems with loss of bowel control? N  Managing your Medications? N  Managing your Finances? N  Housekeeping or managing your Housekeeping? N    Patient Care Team: Varnika Butz, Roxan BROCKS, NP as PCP - General (Family Medicine) Hobart Powell BRAVO, MD (Inactive) as PCP - Cardiology (Cardiology) Maranda Leim DEL, MD as Consulting Physician (Cardiology) Rollin Dover, MD as Consulting Physician (Gastroenterology) Sheldon Standing, MD as Consulting Physician (General Surgery) Patel, Donika K, DO as Consulting Physician (Neurology) [facility], [facility]  Indicate any recent Medical Services you may have received from other than [facility] providers in the past year (date  may be approximate).     Assessment:   This is a routine wellness examination for Sierra Ray.  Hearing/Vision screen Hearing Screening - Comments:: No hearing issues. Vision Screening - Comments:: Eye exam July 2025 some cataract issues.( Dr.Groat)   Goals Addressed             This Visit's Progress    Exercise 3x per week (30 min per time)   Not on track    Starting 07/03/16 I will start doing exercise three days a week.I would like to walk a total of 30 min three times a week and eventually get to aquatics.       Depression Screen    01/14/2024    9:15 AM 12/23/2023    3:35 PM 10/23/2023    9:26 AM 07/03/2023   10:56 AM 01/09/2023    9:33 AM 05/30/2022    1:06 PM 01/08/2022    9:30 AM  PHQ 2/9 Scores  PHQ - 2 Score 0 0 0 0 0 0 0    Fall Risk    01/14/2024    9:15 AM 12/23/2023    3:35 PM 10/23/2023    9:20 AM 07/03/2023   10:56 AM 03/20/2023    9:51 AM  Fall Risk   Falls in the past year? 0 0 0 0 0  Number falls in past yr: 0 0 0 0 0  Injury with Fall? 0 0 0 0 0  Risk for fall due to : No Fall Risks No Fall Risks No Fall Risks No Fall Risks No Fall Risks  Follow up Falls evaluation completed Falls evaluation completed Falls evaluation completed Falls evaluation completed Falls evaluation completed    MEDICARE RISK AT HOME: Medicare Risk at Home Any stairs in or around the home?: Yes If so, are there any without handrails?: No Home free of loose throw rugs in walkways, pet beds, electrical cords, etc?: No Adequate lighting in your home to reduce risk of falls?: Yes Life alert?: No Use of a cane, walker or w/c?: Yes Grab bars in the bathroom?: Yes Shower chair or bench in shower?: No Elevated toilet seat or a handicapped toilet?: No  TIMED UP AND GO:  Was the test performed?  Yes  Length of time to ambulate 10 feet: 15 sec Gait slow and steady without use of assistive device    Cognitive Function:    01/09/2023    9:36 AM 07/24/2017    8:41 AM 07/03/2016    8:57  AM 07/01/2015    3:13 PM  MMSE - Mini Mental State Exam  Not completed:    --  Orientation to time 5 5 5  5    Orientation to Place 5 5 5  5    Registration 3 3 3  3    Attention/ Calculation 5  5 5  5    Recall 1 1 2  3    Language- name 2 objects 2 2 2  2    Language- repeat 1 1 1 1   Language- follow 3 step command 3 3 3  3    Language- read & follow direction 1 1 1  1    Write a sentence 1 1 1  1    Copy design 1 1 1  1    Total score 28 28 29  30       Data saved with a previous flowsheet row definition        01/14/2024    9:15 AM 01/08/2022    8:43 AM 12/27/2020    1:17 PM 12/25/2019    8:16 AM  6CIT Screen  What Year? 0 points 0 points 0 points 0 points  What month? 0 points 0 points 0 points 0 points  What time? 0 points 0 points 0 points 0 points  Count back from 20 0 points 0 points 0 points 0 points  Months in reverse 0 points 0 points 0 points 0 points  Repeat phrase 0 points 2 points 4 points 0 points  Total Score 0 points 2 points 4 points 0 points    Immunizations Immunization History  Administered Date(s) Administered   Fluad Quad(high Dose 65+) 02/11/2019, 02/12/2020, 01/27/2021   Fluad Trivalent(High Dose 65+) 01/09/2023   INFLUENZA, HIGH DOSE SEASONAL PF 01/28/2018, 01/23/2022, 01/14/2024   Influenza,inj,Quad PF,6+ Mos 02/03/2016, 01/23/2017   Moderna Sars-Covid-2 Vaccination 06/22/2019, 07/24/2019, 03/24/2020   PFIZER(Purple Top)SARS-COV-2 Vaccination 04/26/2021   Pneumococcal Conjugate-13 07/30/2014   Pneumococcal Polysaccharide-23 11/17/2005, 05/20/2012   Td 08/18/2010, 02/16/2021   Tdap 02/16/2021   Unspecified SARS-COV-2 Vaccination 08/05/2023    TDAP status: Up to date  Flu Vaccine status: Up to date  Pneumococcal vaccine status: Up to date  Covid-19 vaccine status: Completed vaccines  Qualifies for Shingles Vaccine? Yes   Zostavax completed No   Shingrix Completed?: No.    Education has been provided regarding the importance of this vaccine.  Patient has been advised to call insurance company to determine out of pocket expense if they have not yet received this vaccine. Advised may also receive vaccine at local pharmacy or Health Dept. Verbalized acceptance and understanding.  Screening Tests Health Maintenance  Topic Date Due   COVID-19 Vaccine (6 - Moderna risk 2024-25 season) 02/04/2024   Diabetic kidney evaluation - Urine ACR  03/19/2024   FOOT EXAM  03/26/2024   HEMOGLOBIN A1C  04/18/2024   OPHTHALMOLOGY EXAM  10/14/2024   Diabetic kidney evaluation - eGFR measurement  10/16/2024   Medicare Annual Wellness (AWV)  01/13/2025   DTaP/Tdap/Td (4 - Td or Tdap) 02/17/2031   Pneumococcal Vaccine: 50+ Years  Completed   Influenza Vaccine  Completed   DEXA SCAN  Completed   Hepatitis C Screening  Completed   HPV VACCINES  Aged Out   Meningococcal B Vaccine  Aged Out   Mammogram  Discontinued   Colonoscopy  Discontinued   Zoster Vaccines- Shingrix  Discontinued    Health Maintenance  There are no preventive care reminders to display for this patient.   Colorectal cancer screening: No longer required.   Mammogram status: No longer required due to due to age .  Bone Density status: Completed 08/23/2020. Results reflect: Bone density results: OSTEOPENIA. Repeat every 2 years.Has appointment 08/04/2024  Lung Cancer Screening: (Low Dose CT Chest recommended if Age 46-80 years, 20 pack-year currently smoking OR have quit w/in 15years.) does  not qualify.   Lung Cancer Screening Referral: N/A   Additional Screening:  Hepatitis C Screening: does qualify; Completed Yes   Vision Screening: Recommended annual ophthalmology exams for early detection of glaucoma and other disorders of the eye. Is the patient up to date with their annual eye exam?  Yes  Who is the provider or what is the name of the office in which the patient attends annual eye exams? Dr.Groat  If pt is not established with a provider, would they like to be  referred to a provider to establish care? No .   Dental Screening: Recommended annual dental exams for proper oral hygiene  Diabetic Foot Exam: Diabetic Foot Exam: Completed Yes   Community Resource Referral / Chronic Care Management: CRR required this visit?  No   CCM required this visit?  No     Plan:     I have personally reviewed and noted the following in the patient's chart:   Medical and social history Use of alcohol, tobacco or illicit drugs  Current medications and supplements including opioid prescriptions. Patient is not currently taking opioid prescriptions. Functional ability and status Nutritional status Physical activity Advanced directives List of other physicians Hospitalizations, surgeries, and ER visits in previous 12 months Vitals Screenings to include cognitive, depression, and falls Referrals and appointments  In addition, I have reviewed and discussed with patient certain preventive protocols, quality metrics, and best practice recommendations. A written personalized care plan for preventive services as well as general preventive health recommendations were provided to patient.     Roxan JAYSON Plough, NP   01/14/2024   After Visit Summary: (In Person-Printed) AVS printed and given to the patient  Nurse Notes: Up to date

## 2024-01-14 NOTE — Patient Instructions (Signed)
 Sierra Ray , Thank you for taking time to come for your Medicare Wellness Visit. I appreciate your ongoing commitment to your health goals. Please review the following plan we discussed and let me know if I can assist you in the future.   Screening recommendations/referrals: Colonoscopy : Up to date  Mammogram : : Up to date  Bone Density : Up to date  Recommended yearly ophthalmology/optometry visit for glaucoma screening and checkup Recommended yearly dental visit for hygiene and checkup  Vaccinations: Influenza vaccine- due annually in September/October Pneumococcal vaccine : Up to date  Tdap vaccine : Up to date  Shingles vaccine : Declined     Advanced directives: yes   Conditions/risks identified: advanced age (>15men, >70 women);diabetes mellitus;hypertension;sedentary lifestyle  Next appointment: 1 year    Preventive Care 65 Years and Older, Female Preventive care refers to lifestyle choices and visits with your health care provider that can promote health and wellness. What does preventive care include? A yearly physical exam. This is also called an annual well check. Dental exams once or twice a year. Routine eye exams. Ask your health care provider how often you should have your eyes checked. Personal lifestyle choices, including: Daily care of your teeth and gums. Regular physical activity. Eating a healthy diet. Avoiding tobacco and drug use. Limiting alcohol use. Practicing safe sex. Taking low-dose aspirin every day. Taking vitamin and mineral supplements as recommended by your health care provider. What happens during an annual well check? The services and screenings done by your health care provider during your annual well check will depend on your age, overall health, lifestyle risk factors, and family history of disease. Counseling  Your health care provider may ask you questions about your: Alcohol use. Tobacco use. Drug use. Emotional  well-being. Home and relationship well-being. Sexual activity. Eating habits. History of falls. Memory and ability to understand (cognition). Work and work Astronomer. Reproductive health. Screening  You may have the following tests or measurements: Height, weight, and BMI. Blood pressure. Lipid and cholesterol levels. These may be checked every 5 years, or more frequently if you are over 11 years old. Skin check. Lung cancer screening. You may have this screening every year starting at age 62 if you have a 30-pack-year history of smoking and currently smoke or have quit within the past 15 years. Fecal occult blood test (FOBT) of the stool. You may have this test every year starting at age 62. Flexible sigmoidoscopy or colonoscopy. You may have a sigmoidoscopy every 5 years or a colonoscopy every 10 years starting at age 45. Hepatitis C blood test. Hepatitis B blood test. Sexually transmitted disease (STD) testing. Diabetes screening. This is done by checking your blood sugar (glucose) after you have not eaten for a while (fasting). You may have this done every 1-3 years. Bone density scan. This is done to screen for osteoporosis. You may have this done starting at age 77. Mammogram. This may be done every 1-2 years. Talk to your health care provider about how often you should have regular mammograms. Talk with your health care provider about your test results, treatment options, and if necessary, the need for more tests. Vaccines  Your health care provider may recommend certain vaccines, such as: Influenza vaccine. This is recommended every year. Tetanus, diphtheria, and acellular pertussis (Tdap, Td) vaccine. You may need a Td booster every 10 years. Zoster vaccine. You may need this after age 73. Pneumococcal 13-valent conjugate (PCV13) vaccine. One dose is recommended after age  65. Pneumococcal polysaccharide (PPSV23) vaccine. One dose is recommended after age 33. Talk to your  health care provider about which screenings and vaccines you need and how often you need them. This information is not intended to replace advice given to you by your health care provider. Make sure you discuss any questions you have with your health care provider. Document Released: 04/29/2015 Document Revised: 12/21/2015 Document Reviewed: 02/01/2015 Elsevier Interactive Patient Education  2017 [employer].  Fall Prevention in the Home Falls can cause injuries. They can happen to people of all ages. There are many things you can do to make your home safe and to help prevent falls. What can I do on the outside of my home? Regularly fix the edges of walkways and driveways and fix any cracks. Remove anything that might make you trip as you walk through a door, such as a raised step or threshold. Trim any bushes or trees on the path to your home. Use bright outdoor lighting. Clear any walking paths of anything that might make someone trip, such as rocks or tools. Regularly check to see if handrails are loose or broken. Make sure that both sides of any steps have handrails. Any raised decks and porches should have guardrails on the edges. Have any leaves, snow, or ice cleared regularly. Use sand or salt on walking paths during winter. Clean up any spills in your garage right away. This includes oil or grease spills. What can I do in the bathroom? Use night lights. Install grab bars by the toilet and in the tub and shower. Do not use towel bars as grab bars. Use non-skid mats or decals in the tub or shower. If you need to sit down in the shower, use a plastic, non-slip stool. Keep the floor dry. Clean up any water that spills on the floor as soon as it happens. Remove soap buildup in the tub or shower regularly. Attach bath mats securely with double-sided non-slip rug tape. Do not have throw rugs and other things on the floor that can make you trip. What can I do in the bedroom? Use night  lights. Make sure that you have a light by your bed that is easy to reach. Do not use any sheets or blankets that are too big for your bed. They should not hang down onto the floor. Have a firm chair that has side arms. You can use this for support while you get dressed. Do not have throw rugs and other things on the floor that can make you trip. What can I do in the kitchen? Clean up any spills right away. Avoid walking on wet floors. Keep items that you use a lot in easy-to-reach places. If you need to reach something above you, use a strong step stool that has a grab bar. Keep electrical cords out of the way. Do not use floor polish or wax that makes floors slippery. If you must use wax, use non-skid floor wax. Do not have throw rugs and other things on the floor that can make you trip. What can I do with my stairs? Do not leave any items on the stairs. Make sure that there are handrails on both sides of the stairs and use them. Fix handrails that are broken or loose. Make sure that handrails are as long as the stairways. Check any carpeting to make sure that it is firmly attached to the stairs. Fix any carpet that is loose or worn. Avoid having throw rugs at  the top or bottom of the stairs. If you do have throw rugs, attach them to the floor with carpet tape. Make sure that you have a light switch at the top of the stairs and the bottom of the stairs. If you do not have them, ask someone to add them for you. What else can I do to help prevent falls? Wear shoes that: Do not have high heels. Have rubber bottoms. Are comfortable and fit you well. Are closed at the toe. Do not wear sandals. If you use a stepladder: Make sure that it is fully opened. Do not climb a closed stepladder. Make sure that both sides of the stepladder are locked into place. Ask someone to hold it for you, if possible. Clearly mark and make sure that you can see: Any grab bars or handrails. First and last  steps. Where the edge of each step is. Use tools that help you move around (mobility aids) if they are needed. These include: Canes. Walkers. Scooters. Crutches. Turn on the lights when you go into a dark area. Replace any light bulbs as soon as they burn out. Set up your furniture so you have a clear path. Avoid moving your furniture around. If any of your floors are uneven, fix them. If there are any pets around you, be aware of where they are. Review your medicines with your doctor. Some medicines can make you feel dizzy. This can increase your chance of falling. Ask your doctor what other things that you can do to help prevent falls. This information is not intended to replace advice given to you by your health care provider. Make sure you discuss any questions you have with your health care provider. Document Released: 01/27/2009 Document Revised: 09/08/2015 Document Reviewed: 05/07/2014 Elsevier Interactive Patient Education  2017 [employer].

## 2024-01-20 ENCOUNTER — Other Ambulatory Visit

## 2024-01-20 DIAGNOSIS — E1142 Type 2 diabetes mellitus with diabetic polyneuropathy: Secondary | ICD-10-CM

## 2024-01-20 LAB — HEMOGLOBIN A1C
Hgb A1c MFr Bld: 7.3 % — ABNORMAL HIGH (ref <5.7)
Mean Plasma Glucose: 163 mg/dL
eAG (mmol/L): 9 mmol/L

## 2024-01-20 LAB — BASIC METABOLIC PANEL WITH GFR
BUN: 19 mg/dL (ref 7–25)
CO2: 29 mmol/L (ref 20–32)
Calcium: 9.6 mg/dL (ref 8.6–10.4)
Chloride: 102 mmol/L (ref 98–110)
Creat: 0.86 mg/dL (ref 0.60–1.00)
Glucose, Bld: 116 mg/dL — ABNORMAL HIGH (ref 65–99)
Potassium: 4.5 mmol/L (ref 3.5–5.3)
Sodium: 139 mmol/L (ref 135–146)
eGFR: 70 mL/min/1.73m2 (ref >=60)

## 2024-01-23 ENCOUNTER — Ambulatory Visit: Payer: Self-pay | Admitting: Family

## 2024-01-23 ENCOUNTER — Ambulatory Visit: Admitting: Family

## 2024-01-23 ENCOUNTER — Encounter: Payer: Self-pay | Admitting: Family

## 2024-01-23 VITALS — BP 124/76 | HR 82 | Temp 97.6°F | Resp 18 | Ht 66.0 in | Wt 153.0 lb

## 2024-01-23 DIAGNOSIS — J392 Other diseases of pharynx: Secondary | ICD-10-CM | POA: Diagnosis not present

## 2024-01-23 DIAGNOSIS — R490 Dysphonia: Secondary | ICD-10-CM | POA: Insufficient documentation

## 2024-01-23 DIAGNOSIS — M25571 Pain in right ankle and joints of right foot: Secondary | ICD-10-CM

## 2024-01-23 DIAGNOSIS — K219 Gastro-esophageal reflux disease without esophagitis: Secondary | ICD-10-CM | POA: Diagnosis not present

## 2024-01-23 DIAGNOSIS — E785 Hyperlipidemia, unspecified: Secondary | ICD-10-CM | POA: Diagnosis not present

## 2024-01-23 DIAGNOSIS — I10 Essential (primary) hypertension: Secondary | ICD-10-CM

## 2024-01-23 DIAGNOSIS — E1142 Type 2 diabetes mellitus with diabetic polyneuropathy: Secondary | ICD-10-CM

## 2024-01-23 DIAGNOSIS — J302 Other seasonal allergic rhinitis: Secondary | ICD-10-CM

## 2024-01-23 DIAGNOSIS — I491 Atrial premature depolarization: Secondary | ICD-10-CM | POA: Insufficient documentation

## 2024-01-23 DIAGNOSIS — R252 Cramp and spasm: Secondary | ICD-10-CM | POA: Insufficient documentation

## 2024-01-23 DIAGNOSIS — K5901 Slow transit constipation: Secondary | ICD-10-CM | POA: Diagnosis not present

## 2024-01-23 DIAGNOSIS — H6121 Impacted cerumen, right ear: Secondary | ICD-10-CM

## 2024-01-23 DIAGNOSIS — I83893 Varicose veins of bilateral lower extremities with other complications: Secondary | ICD-10-CM

## 2024-01-23 DIAGNOSIS — M25572 Pain in left ankle and joints of left foot: Secondary | ICD-10-CM

## 2024-01-23 MED ORDER — DEBROX 6.5 % OT SOLN: 5.0000 [drp] | Freq: Two times a day (BID) | OTIC | 0 refills | Status: AC

## 2024-01-23 NOTE — Progress Notes (Signed)
 Provider: Roxan Plough FNP-C   Sierra Ray, Sierra BROCKS, NP  Patient Care Team: Trinna Kunst, Sierra BROCKS, NP as PCP - General (Family Medicine) Hobart Powell BRAVO, MD (Inactive) as PCP - Cardiology (Cardiology) Maranda Leim DEL, MD as Consulting Physician (Cardiology) Rollin Dover, MD as Consulting Physician (Gastroenterology) Sheldon Standing, MD as Consulting Physician (General Surgery) Patel, Donika K, DO as Consulting Physician (Neurology) [facility]  Extended Emergency Contact Information Primary Emergency Contact: Sierra Ray Mobile Phone: [phone] Relation: Daughter  Code Status:  Full Code  Goals of care: Advanced Directive information    01/14/2024    9:13 AM  Advanced Directives  Does Patient Have a Medical Advance Directive? Yes  Type of Advance Directive Living will  Does patient want to make changes to medical advance directive? No - Patient declined     Chief Complaint  Patient presents with   Medical Management of Chronic Issues    3 Month follow up for diabetes.    Discussed the use of AI scribe software for clinical note transcription with the patient, who gave verbal consent to proceed.  History of Present Illness   Sierra Wike V. is a 78 year old female with diabetes who presents for a three-month follow-up.  Her glucose level has improved from 125 to 116, and her A1c has decreased from 7.8 to 7.3. She is relieved about these improvements.  She experiences cramps in her calves, which have been less severe recently. She uses alcohol to massage her foot when it hurts and has resumed taking Flexeril  in the evening and at night. The medication does not cause dizziness as it did previously.  She has ongoing issues with constipation and an upcoming appointment with a gastroenterologist. She has tried various methods, including colon cleanse and Miralax, to manage her symptoms. A previous CT scan showed a lot of stool but no other  issues.  She describes a dry throat and pain in her right ear, which sometimes feels like it is popping. She uses lozenges and salt water to manage her symptoms. No nasal drainage or acid reflux, but she has white phlegm.  She experiences allergies, particularly to grass and trees, causing congestion and difficulty breathing. She takes Claritin to manage her symptoms.  She feels down some mornings, attributing these feelings to physical sensations rather than emotional distress.  She uses compression stockings occasionally to help with varicose veins and cramping in her legs. She experiences pain in her ankles and uses Salonpas and straps for relief. She has arthritis in her toes, making certain stockings uncomfortable.    Past Medical History:  Diagnosis Date   Abnormal Pap smear 05/08/2011   ASC-US  +HPV   Arthritis    Asthma    Constipation    Diabetes mellitus type 2 with neurological manifestations (HCC)    Diabetic peripheral neuropathy (HCC)    Generalized osteoarthritis    GERD (gastroesophageal reflux disease)    Hx of seasonal allergies    Hyperlipidemia    Hypertension    IBS (irritable bowel syndrome)    S/P total hysterectomy 06/21/2011   Past Surgical History:  Procedure Laterality Date   CATARACT EXTRACTION Left    CATARACT EXTRACTION Right 12/25/2022   EYE SURGERY Right 01/2018   Removed raised area on eye. Dr.Groat    OVARIAN CYST SURGERY  1986   PELVIC LAPAROSCOPY     TONSILECTOMY, ADENOIDECTOMY, BILATERAL MYRINGOTOMY AND TUBES  1974   VAGINAL HYSTERECTOMY  1983    Allergies  Allergen  Reactions   Aspirin    Pravastatin  Sodium Other (See Comments)    Muscle aches   Restasis [Cyclosporine] Other (See Comments)   Lisinopril  Other (See Comments)    Blood pressures low and causes constipation    Allergies as of 01/23/2024       Reactions   Aspirin    Pravastatin  Sodium Other (See Comments)   Muscle aches   Restasis [cyclosporine] Other (See Comments)    Lisinopril  Other (See Comments)   Blood pressures low and causes constipation        Medication List        Accurate as of January 23, 2024 11:34 AM. If you have any questions, ask your nurse or doctor.          Accu-Chek Aviva Plus test strip Generic drug: glucose blood TEST BLOOD SUGAR TWICE DAILY   Accu-Chek Aviva Plus w/Device Kit Use to test blood sugar twice daily. Dx E11.42   Accu-Chek Softclix Lancet Dev Kit Use to test blood sugar. Dx: E11.42   Accu-Chek Softclix Lancets lancets USE TO TEST BLOOD SUGAR ONE TIME DAILY FOR DIABETES.   acetaminophen  500 MG tablet Commonly known as: TYLENOL  Take 1 tablet (500 mg total) by mouth every 8 (eight) hours as needed for moderate pain.   Align 4 MG Caps Take 1 capsule by mouth daily.   amLODipine  5 MG tablet Commonly known as: NORVASC  TAKE 1 TABLET EVERY DAY   b complex vitamins capsule Take 1 capsule by mouth daily.   cholecalciferol 1000 units tablet Commonly known as: VITAMIN D Take 1,000 Units by mouth daily.   cyclobenzaprine  5 MG tablet Commonly known as: FLEXERIL  Take 5 mg by mouth 3 (three) times daily as needed for muscle spasms.   Debrox 6.5 % OTIC solution Generic drug: carbamide peroxide Place 5 drops into the right ear 2 (two) times daily for 4 days. Started by: Sierra Ray   DropSafe Alcohol Prep 70 % Pads USE TO TEST BLOOD SUGAR TWICE DAILY.   fluticasone  50 MCG/ACT nasal spray Commonly known as: FLONASE  Place 2 sprays into both nostrils as needed.   latanoprost 0.005 % ophthalmic solution Commonly known as: XALATAN 1 drop at bedtime.   loratadine 10 MG tablet Commonly known as: CLARITIN Take 10 mg by mouth daily as needed.   Magnesium 250 MG Tabs Take by mouth.   metFORMIN  500 MG tablet Commonly known as: GLUCOPHAGE  TAKE 2 TABLETS EVERY DAY WITH BREAKFAST   metoprolol  tartrate 25 MG tablet Commonly known as: LOPRESSOR  Take 0.5 tablets (12.5 mg total) by mouth 2 (two)  times daily as needed (palpitations).   multivitamin-lutein Caps capsule Take 1 capsule by mouth daily.   multivitamin with minerals tablet Take 1 tablet by mouth daily.   polyethylene glycol 17 g packet Commonly known as: MIRALAX / GLYCOLAX Take 17 g by mouth daily as needed. For constipation   rosuvastatin  5 MG tablet Commonly known as: CRESTOR  TAKE 1 TABLET EVERY DAY   SYSTANE COMPLETE OP Place 1 drop into both eyes in the morning, at noon, in the evening, and at bedtime.   zinc gluconate 50 MG tablet Take 50 mg by mouth daily.        Review of Systems  Constitutional:  Negative for appetite change, chills, fatigue, fever and unexpected weight change.  HENT:  Negative for congestion, ear discharge, ear pain, hearing loss, nosebleeds, postnasal drip, sinus pressure, sinus pain, sneezing, sore throat, tinnitus and trouble swallowing.  Dry throat  Seasonal allergies   Eyes:  Positive for visual disturbance. Negative for pain, discharge, redness and itching.       Wears eye glasses  Respiratory:  Negative for cough, chest tightness, shortness of breath and wheezing.   Cardiovascular:  Negative for chest pain, palpitations and leg swelling.  Gastrointestinal:  Negative for abdominal distention, abdominal pain, blood in stool, constipation, diarrhea, nausea and vomiting.  Endocrine: Negative for cold intolerance, heat intolerance, polydipsia, polyphagia and polyuria.  Genitourinary:  Negative for difficulty urinating, dysuria, flank pain, frequency and urgency.  Musculoskeletal:  Positive for arthralgias and gait problem. Negative for back pain, joint swelling, myalgias, neck pain and neck stiffness.       Pain on the foot/toes   Skin:  Negative for color change, pallor, rash and wound.  Neurological:  Negative for dizziness, syncope, speech difficulty, weakness, light-headedness, numbness and headaches.  Hematological:  Does not bruise/bleed easily.   Psychiatric/Behavioral:  Negative for agitation, behavioral problems, confusion, hallucinations, self-injury, sleep disturbance and suicidal ideas. The patient is not nervous/anxious.     Immunization History  Administered Date(s) Administered   Fluad Quad(high Dose 65+) 02/11/2019, 02/12/2020, 01/27/2021   Fluad Trivalent(High Dose 65+) 01/09/2023   INFLUENZA, HIGH DOSE SEASONAL PF 01/28/2018, 01/23/2022, 01/14/2024   Influenza,inj,Quad PF,6+ Mos 02/03/2016, 01/23/2017   Moderna Sars-Covid-2 Vaccination 06/22/2019, 07/24/2019, 03/24/2020   PFIZER(Purple Top)SARS-COV-2 Vaccination 04/26/2021   Pneumococcal Conjugate-13 07/30/2014   Pneumococcal Polysaccharide-23 11/17/2005, 05/20/2012   Td 08/18/2010, 02/16/2021   Tdap 02/16/2021   Unspecified SARS-COV-2 Vaccination 08/05/2023   Pertinent  Health Maintenance Due  Topic Date Due   FOOT EXAM  03/26/2024   HEMOGLOBIN A1C  07/20/2024   OPHTHALMOLOGY EXAM  10/14/2024   Influenza Vaccine  Completed   DEXA SCAN  Completed   Mammogram  Discontinued   Colonoscopy  Discontinued      03/20/2023    9:51 AM 07/03/2023   10:56 AM 10/23/2023    9:20 AM 12/23/2023    3:35 PM 01/14/2024    9:15 AM  Fall Risk  Falls in the past year? 0 0 0 0 0  Was there an injury with Fall? 0 0 0 0 0  Fall Risk Category Calculator 0 0 0 0 0  Patient at Risk for Falls Due to No Fall Risks No Fall Risks No Fall Risks No Fall Risks No Fall Risks  Fall risk Follow up Falls evaluation completed Falls evaluation completed Falls evaluation completed Falls evaluation completed Falls evaluation completed   Functional Status Survey:    Vitals:   01/23/24 0859  BP: 124/76  Pulse: 82  Resp: 18  Temp: 97.6 F (36.4 C)  SpO2: 98%  Weight: 153 lb (69.4 kg)  Height: 5' 6 (1.676 m)   Body mass index is 24.69 kg/m. Physical Exam VITALS: T- 97.6, P- 82, BP- 124/76, SaO2- 98% MEASUREMENTS: Weight- 153. GENERAL: Alert, cooperative, well developed, no acute  distress. HEENT: Normocephalic, normal oropharynx, moist mucous membranes. Right ear normal. Left ear cerumen impaction obscuring eardrum. Nose without swelling. Throat without redness. CHEST: Clear to auscultation bilaterally. No wheezes, rhonchi, or crackles. CARDIOVASCULAR: Normal heart rate and rhythm, S1 and S2 normal without murmurs. Ropy varicose vein worst on right shin area  ABDOMEN: Soft, non-tender, non-distended, without organomegaly. Normal bowel sounds. Abdomen normal to auscultation. EXTREMITIES: No cyanosis or edema. Extremities with normal range of motion. No knee swelling or tenderness. NEUROLOGICAL: Cranial nerves grossly intact, moves all extremities without gross motor or sensory deficit.  Labs reviewed: Recent Labs    03/18/23 0808 10/17/23 0820 01/20/24 0835  NA 140 139 139  K 4.4 4.0 4.5  CL 104 101 102  CO2 29 30 29   GLUCOSE 123* 125* 116*  BUN 24 21 19   CREATININE 0.92 0.89 0.86  CALCIUM  9.3 9.3 9.6   Recent Labs    03/18/23 0808 10/17/23 0820  AST 18 18  ALT 14 12  BILITOT 0.4 0.4  PROT 7.2 7.2   Recent Labs    03/18/23 0808 10/17/23 0820  WBC 5.2 5.0  NEUTROABS 1,862 1,895  HGB 13.2 13.0  HCT 40.9 40.7  MCV 83.8 84.8  PLT 179 168   Lab Results  Component Value Date   TSH 1.86 10/17/2023   Lab Results  Component Value Date   HGBA1C 7.3 (H) 01/20/2024   Lab Results  Component Value Date   CHOL 152 10/17/2023   HDL 77 10/17/2023   LDLCALC 55 10/17/2023   TRIG 117 10/17/2023   CHOLHDL 2.0 10/17/2023    Significant Diagnostic Results in last 30 days:  DG Abd 1 View Result Date: 12/27/2023 EXAM: 1 VIEW XRAY OF THE ABDOMEN 12/24/2023 01:09:38 PM COMPARISON: None available. CLINICAL HISTORY: Loose stool, hyperactive bowel sounds. Chronic complaints of loose stool and hyperactive bowels sounds with inability to control bowels for 3 days, history of hysterectomy and surgery for ovarian cyst. FINDINGS: BOWEL: Nonobstructive bowel gas  pattern. Moderate stool throughout the colon. SOFT TISSUES: No opaque urinary calculi. BONES: No acute osseous abnormality. IMPRESSION: 1. Moderate stool throughout the colon. 2. No acute findings. Electronically signed by: Waddell Calk MD 12/27/2023 02:17 PM EDT RP Workstation: [id number]    Assessment/Plan    Type 2 diabetes mellitus Improved glycemic control with A1c reduced from 7.8% to 7.3% and glucose levels decreased from 125 mg/dL to 883 mg/dL. - Continue current diet and exercise regimen to maintain glycemic control. - Recheck labs in six months.  Constipation Chronic constipation with recent exacerbation. Previous advice to avoid Miralax, but she has used it due to lack of bowel movements. Upcoming gastroenterology appointment for further evaluation. - Continue current bowel regimen until gastroenterology follow-up. - Follow up with gastroenterologist next Wednesday.  Varicose veins of lower extremity Presence of varicose veins with associated cramping and discomfort. - Wear compression stockings regularly, especially in the morning. - Consider vascular specialist referral if symptoms persist.  Arthralgia of foot and toes Intermittent pain in the foot and toes, possibly related to arthritis. Pain exacerbated by certain activities. - Continue using Salonpas and compression stockings as needed. - Consider further evaluation if pain persists.  Allergic rhinitis Allergic rhinitis with symptoms of congestion and phlegm. No teary eyes or nasal drainage. - Continue Claritin for allergy management.  Dry throat Persistent dry throat with associated phlegm. No signs of infection or acid reflux. - Continue using lozenges and salt water gargles for symptom relief.  Cerumen impaction, right ear Cerumen impaction in the right ear causing discomfort and inability to visualize the eardrum. No infection noted. - Use Debrox drops, 5 drops in the morning and 5 in the evening for 4 days. -  Return for follow-up in one week to reassess ear. She will call to schedule appointment for right ear lavage.  Family/ staff Communication: Reviewed plan of care with patient verbalized understanding   Labs/tests ordered:  - CBC with Differential/Platelet - CMP with eGFR([facility]) - TSH - Hgb A1C - Lipid panel  Next Appointment : Return in about 6 months (around  07/23/2024) for medical mangement of chronic issues., Fasting labs in 6 months prior to visit.   Spent 30 minutes of Face to face and non-face to face with patient  >50% time spent counseling; reviewing medical record; tests; labs; documentation and developing future plan of care.   Sierra JAYSON Plough, NP

## 2024-01-23 NOTE — Patient Instructions (Signed)
 -  Instill debrox 6.5 otic solution 5 drops into right ear twice daily x 4 days then follow up for ear lavage.May apply cotton ball at bedtime to prevent drainage to pillow.

## 2024-01-28 ENCOUNTER — Ambulatory Visit (INDEPENDENT_AMBULATORY_CARE_PROVIDER_SITE_OTHER): Admitting: Sports Medicine

## 2024-01-28 ENCOUNTER — Encounter: Payer: Self-pay | Admitting: Podiatry

## 2024-01-28 ENCOUNTER — Ambulatory Visit: Admitting: Podiatry

## 2024-01-28 ENCOUNTER — Encounter: Payer: Self-pay | Admitting: Sports Medicine

## 2024-01-28 VITALS — BP 128/72 | HR 94 | Temp 98.0°F | Ht 66.0 in | Wt 150.4 lb

## 2024-01-28 DIAGNOSIS — L84 Corns and callosities: Secondary | ICD-10-CM | POA: Diagnosis not present

## 2024-01-28 DIAGNOSIS — H6121 Impacted cerumen, right ear: Secondary | ICD-10-CM

## 2024-01-28 DIAGNOSIS — B351 Tinea unguium: Secondary | ICD-10-CM | POA: Diagnosis not present

## 2024-01-28 DIAGNOSIS — E1142 Type 2 diabetes mellitus with diabetic polyneuropathy: Secondary | ICD-10-CM | POA: Diagnosis not present

## 2024-01-28 DIAGNOSIS — M79609 Pain in unspecified limb: Secondary | ICD-10-CM

## 2024-01-28 NOTE — Progress Notes (Signed)
 Careteam: Patient Care Team: [name], Roxan BROCKS, NP as PCP - General (Family Medicine) Hobart Powell BRAVO, MD (Inactive) as PCP - Cardiology (Cardiology) Maranda Leim DEL, MD as Consulting Physician (Cardiology) Rollin Dover, MD as Consulting Physician (Gastroenterology) Sheldon Standing, MD as Consulting Physician (General Surgery) Tobie Tonita POUR, DO as Consulting Physician (Neurology) [place], [facility]  PLACE OF SERVICE:  [facility]  Advanced Directive information    Allergies  Allergen Reactions   Aspirin    Pravastatin  Sodium Other (See Comments)    Muscle aches   Restasis [Cyclosporine] Other (See Comments)   Lisinopril  Other (See Comments)    Blood pressures low and causes constipation    Chief Complaint  Patient presents with   Acute Visit    Ear lavage in right ear.     Discussed the use of AI scribe software for clinical note transcription with the patient, who gave verbal consent to proceed.  History of Present Illness    78 yr old F with h/o DM, HTN, HLD  presented to clinic for ear irrigation  Pt denies fevers, chills, cough, congestion, sore throat  She has been using debrox ear drops  Denies ear pain, discharge , hearing impairment  Review of Systems:  Review of Systems  Constitutional:  Negative for chills and fever.  Respiratory:  Negative for cough, sputum production and shortness of breath.   Cardiovascular:  Negative for chest pain, palpitations and leg swelling.  Gastrointestinal:  Negative for heartburn and nausea.  Genitourinary:  Negative for dysuria and frequency.  Neurological:  Negative for dizziness.   Negative unless indicated in HPI.   Past Medical History:  Diagnosis Date   Abnormal Pap smear 05/08/2011   ASC-US  +HPV   Arthritis    Asthma    Constipation    Diabetes mellitus type 2 with neurological manifestations (HCC)    Diabetic peripheral neuropathy (HCC)    Generalized osteoarthritis    GERD (gastroesophageal  reflux disease)    Hx of seasonal allergies    Hyperlipidemia    Hypertension    IBS (irritable bowel syndrome)    S/P total hysterectomy 06/21/2011   Past Surgical History:  Procedure Laterality Date   CATARACT EXTRACTION Left    CATARACT EXTRACTION Right 12/25/2022   EYE SURGERY Right 01/2018   Removed raised area on eye. Dr.Groat    OVARIAN CYST SURGERY  1986   PELVIC LAPAROSCOPY     TONSILECTOMY, ADENOIDECTOMY, BILATERAL MYRINGOTOMY AND TUBES  1974   VAGINAL HYSTERECTOMY  1983   Social History:   reports that she has never smoked. She has never used smokeless tobacco. She reports that she does not drink alcohol and does not use drugs.  Family History  Problem Relation Age of Onset   Diabetes Mother    Cancer Mother        colon    Diabetes Father    Heart disease Father    Pancreatic cancer Sister    Breast cancer Maternal Aunt    Breast cancer Paternal Aunt    Cancer Paternal Grandmother        liver    Breast cancer Cousin    Breast cancer Cousin    Pancreatic cancer Brother     Medications: Patient's Medications  New Prescriptions   No medications on file  Previous Medications   ACCU-CHEK AVIVA PLUS TEST STRIP    TEST BLOOD SUGAR TWICE DAILY   ACCU-CHEK SOFTCLIX LANCETS LANCETS    USE TO TEST BLOOD  SUGAR ONE TIME DAILY FOR DIABETES.   ACETAMINOPHEN  (TYLENOL ) 500 MG TABLET    Take 1 tablet (500 mg total) by mouth every 8 (eight) hours as needed for moderate pain.   ALCOHOL SWABS (DROPSAFE ALCOHOL PREP) 70 % PADS    USE TO TEST BLOOD SUGAR TWICE DAILY.   AMLODIPINE  (NORVASC ) 5 MG TABLET    TAKE 1 TABLET EVERY DAY   B COMPLEX VITAMINS CAPSULE    Take 1 capsule by mouth daily.   BLOOD GLUCOSE MONITORING SUPPL (ACCU-CHEK AVIVA PLUS) W/DEVICE KIT    Use to test blood sugar twice daily. Dx E11.42   CHOLECALCIFEROL (VITAMIN D) 1000 UNITS TABLET    Take 1,000 Units by mouth daily.   CYCLOBENZAPRINE  (FLEXERIL ) 5 MG TABLET    Take 5 mg by mouth 3 (three) times daily as  needed for muscle spasms.   FLUTICASONE  (FLONASE ) 50 MCG/ACT NASAL SPRAY    Place 2 sprays into both nostrils as needed.    LANCETS MISC. (ACCU-CHEK SOFTCLIX LANCET DEV) KIT    Use to test blood sugar. Dx: E11.42   LATANOPROST (XALATAN) 0.005 % OPHTHALMIC SOLUTION    1 drop at bedtime.   LORATADINE (CLARITIN) 10 MG TABLET    Take 10 mg by mouth daily as needed.   METFORMIN  (GLUCOPHAGE ) 500 MG TABLET    TAKE 2 TABLETS EVERY DAY WITH BREAKFAST   MULTIPLE VITAMINS-MINERALS (MULTIVITAMIN WITH MINERALS) TABLET    Take 1 tablet by mouth daily.   MULTIVITAMIN-LUTEIN (OCUVITE-LUTEIN) CAPS CAPSULE    Take 1 capsule by mouth daily.   POLYETHYLENE GLYCOL (MIRALAX / GLYCOLAX) PACKET    Take 17 g by mouth daily as needed. For constipation   PROBIOTIC PRODUCT (ALIGN) 4 MG CAPS    Take 1 capsule by mouth daily.    PROPYLENE GLYCOL (SYSTANE COMPLETE OP)    Place 1 drop into both eyes in the morning, at noon, in the evening, and at bedtime.   ROSUVASTATIN  (CRESTOR ) 5 MG TABLET    TAKE 1 TABLET EVERY DAY   ZINC GLUCONATE 50 MG TABLET    Take 50 mg by mouth daily.  Modified Medications   No medications on file  Discontinued Medications   No medications on file    Physical Exam: Vitals:   01/28/24 1358  BP: 128/72  Pulse: 94  Temp: 98 F (36.7 C)  SpO2: 96%  Weight: 150 lb 6.4 oz (68.2 kg)  Height: 5' 6 (1.676 m)   Body mass index is 24.28 kg/m. BP Readings from Last 3 Encounters:  01/28/24 128/72  01/23/24 124/76  01/14/24 128/80   Wt Readings from Last 3 Encounters:  01/28/24 150 lb 6.4 oz (68.2 kg)  01/23/24 153 lb (69.4 kg)  01/14/24 151 lb 3.2 oz (68.6 kg)    Physical Exam Constitutional:      Appearance: Normal appearance.  HENT:     Head: Normocephalic and atraumatic.     Right Ear: There is impacted cerumen.  Cardiovascular:     Rate and Rhythm: Normal rate and regular rhythm.  Pulmonary:     Effort: Pulmonary effort is normal. No respiratory distress.     Breath sounds:  Normal breath sounds. No wheezing.  Abdominal:     General: Bowel sounds are normal. There is no distension.     Tenderness: There is no abdominal tenderness. There is no guarding or rebound.     Comments:    Neurological:     Mental Status: She is alert. Mental  status is at baseline.     Motor: No weakness.     Labs reviewed: Basic Metabolic Panel: Recent Labs    03/18/23 0808 10/17/23 0820 01/20/24 0835  NA 140 139 139  K 4.4 4.0 4.5  CL 104 101 102  CO2 29 30 29   GLUCOSE 123* 125* 116*  BUN 24 21 19   CREATININE 0.92 0.89 0.86  CALCIUM  9.3 9.3 9.6  TSH 1.69 1.86  --    Liver Function Tests: Recent Labs    03/18/23 0808 10/17/23 0820  AST 18 18  ALT 14 12  BILITOT 0.4 0.4  PROT 7.2 7.2   No results for input(s): LIPASE, AMYLASE in the last 8760 hours. No results for input(s): AMMONIA in the last 8760 hours. CBC: Recent Labs    03/18/23 0808 10/17/23 0820  WBC 5.2 5.0  NEUTROABS 1,862 1,895  HGB 13.2 13.0  HCT 40.9 40.7  MCV 83.8 84.8  PLT 179 168   Lipid Panel: Recent Labs    03/18/23 0808 10/17/23 0820  CHOL 179 152  HDL 75 77  LDLCALC 81 55  TRIG 125 117  CHOLHDL 2.4 2.0   TSH: Recent Labs    03/18/23 0808 10/17/23 0820  TSH 1.69 1.86   A1C: Lab Results  Component Value Date   HGBA1C 7.3 (H) 01/20/2024    Assessment and Plan Assessment & Plan   1. Impacted cerumen of right ear (Primary)  Ear irrigation unsuccessful  Instructed to use debrox ear drops

## 2024-01-29 DIAGNOSIS — K5904 Chronic idiopathic constipation: Secondary | ICD-10-CM | POA: Diagnosis not present

## 2024-01-29 DIAGNOSIS — R630 Anorexia: Secondary | ICD-10-CM | POA: Diagnosis not present

## 2024-02-02 NOTE — Progress Notes (Signed)
 Subjective:  Patient ID: Sierra Beining V., female    DOB: 07/12/1945,  MRN: [id number]  Sierra Pineau V. presents to clinic today for at risk foot care with history of diabetic neuropathy and callus(es) of both feet and painful mycotic toenails that are difficult to trim. Painful toenails interfere with ambulation. Aggravating factors include wearing enclosed shoe gear. Pain is relieved with periodic professional debridement. Painful calluses are aggravated when weightbearing with and without shoegear. Pain is relieved with periodic professional debridement.  Chief Complaint  Patient presents with   Diabetes    Patient states that her A1c is 7.3, she last saw her PCP a week ago and stated that her PCP name is [name] [name]    New problem(s): None.   PCP is [name], Roxan BROCKS, NP.  Allergies  Allergen Reactions   Aspirin    Pravastatin  Sodium Other (See Comments)    Muscle aches   Restasis [Cyclosporine] Other (See Comments)   Lisinopril  Other (See Comments)    Blood pressures low and causes constipation    Review of Systems: Negative except as noted in the HPI.  Objective: No changes noted in today's physical examination. There were no vitals filed for this visit. Sierra Mazzeo V. is a pleasant 78 y.o. female WD, WN in NAD. AAO x 3.  Vascular Examination: Capillary refill time immediate b/l. Palpable pedal pulses. Pedal hair present b/l. Pedal edema absent. No pain with calf compression b/l. Skin temperature gradient WNL b/l. No cyanosis or clubbing b/l. No ischemia or gangrene noted b/l.   Neurological Examination: Sensation grossly intact b/l with 10 gram monofilament. Vibratory sensation intact b/l. Pt has subjective symptoms of neuropathy.  Dermatological Examination: Pedal skin with normal turgor, texture and tone b/l.  No open wounds. No interdigital macerations.   Toenails 1-5 b/l thick, discolored, elongated with subungual debris and pain on dorsal  palpation.   Hyperkeratotic lesion(s) medial IPJ of left great toe, medial IPJ of right great toe, and submet head 1 left foot.  No erythema, no edema, no drainage, no fluctuance.  Musculoskeletal Examination: Muscle strength 5/5 to all lower extremity muscle groups bilaterally. No pain, crepitus or joint limitation noted with ROM b/l LE. No gross bony pedal deformities b/l. Patient ambulates independently without assistive aids.  Radiographs: None  Last A1c:      Latest Ref Rng & Units 01/20/2024    8:35 AM 10/17/2023    8:20 AM 03/18/2023    8:08 AM  Hemoglobin A1C  Hemoglobin-A1c <5.7 % 7.3  7.8  7.4    Assessment/Plan: 1. Pain due to onychomycosis of nail   2. Callus   3. Type 2 diabetes mellitus with diabetic polyneuropathy, without long-term current use of insulin (HCC)   Patient was evaluated and treated. All patient's and/or POA's questions/concerns addressed on today's visit. Toenails 1-5 b/l debrided in length and girth without incident. Callus(es) medial IPJ of left great toe, medial IPJ of right great toe, and submet head 1 left foot pared with sharp debridement without incident. Continue daily foot inspections and monitor blood glucose per PCP/Endocrinologist's recommendations. Continue soft, supportive shoe gear daily. Report any pedal injuries to medical professional. Call office if there are any questions/concerns. -Patient/POA to call should there be question/concern in the interim.   Return in about 3 months (around 04/29/2024).  Sierra Ray, DPM      [place] LOCATION: [address]  [place], [place] [address]                   Office [phone]   [facility] LOCATION: [address] [place], [place] [address] Office [phone]

## 2024-02-17 ENCOUNTER — Encounter: Payer: Self-pay | Admitting: Radiology

## 2024-02-19 DIAGNOSIS — H401132 Primary open-angle glaucoma, bilateral, moderate stage: Secondary | ICD-10-CM | POA: Diagnosis not present

## 2024-02-19 DIAGNOSIS — Z961 Presence of intraocular lens: Secondary | ICD-10-CM | POA: Diagnosis not present

## 2024-02-23 ENCOUNTER — Other Ambulatory Visit: Payer: Self-pay | Admitting: Family

## 2024-02-23 DIAGNOSIS — E1142 Type 2 diabetes mellitus with diabetic polyneuropathy: Secondary | ICD-10-CM

## 2024-02-23 DIAGNOSIS — E1169 Type 2 diabetes mellitus with other specified complication: Secondary | ICD-10-CM

## 2024-02-26 DIAGNOSIS — K5904 Chronic idiopathic constipation: Secondary | ICD-10-CM | POA: Diagnosis not present

## 2024-04-01 ENCOUNTER — Encounter (HOSPITAL_COMMUNITY): Payer: Self-pay | Admitting: *Deleted

## 2024-04-01 ENCOUNTER — Emergency Department (HOSPITAL_COMMUNITY)
Admission: EM | Admit: 2024-04-01 | Discharge: 2024-04-01 | Disposition: A | Discharge status: Home / Self Care | Attending: Emergency Medicine | Admitting: Emergency Medicine

## 2024-04-01 ENCOUNTER — Emergency Department (HOSPITAL_COMMUNITY)

## 2024-04-01 ENCOUNTER — Other Ambulatory Visit: Payer: Self-pay

## 2024-04-01 DIAGNOSIS — I10 Essential (primary) hypertension: Secondary | ICD-10-CM | POA: Insufficient documentation

## 2024-04-01 DIAGNOSIS — Z79899 Other long term (current) drug therapy: Secondary | ICD-10-CM | POA: Insufficient documentation

## 2024-04-01 DIAGNOSIS — W182XXA Fall in (into) shower or empty bathtub, initial encounter: Secondary | ICD-10-CM | POA: Insufficient documentation

## 2024-04-01 DIAGNOSIS — W19XXXA Unspecified fall, initial encounter: Secondary | ICD-10-CM

## 2024-04-01 DIAGNOSIS — M549 Dorsalgia, unspecified: Secondary | ICD-10-CM | POA: Diagnosis not present

## 2024-04-01 DIAGNOSIS — S0990XA Unspecified injury of head, initial encounter: Secondary | ICD-10-CM | POA: Insufficient documentation

## 2024-04-01 MED ORDER — METHOCARBAMOL 500 MG PO TABS: 500.0000 mg | ORAL_TABLET | Freq: Two times a day (BID) | ORAL | 0 refills | Status: DC

## 2024-04-01 NOTE — ED Provider Notes (Cosign Needed)
 [place] EMERGENCY DEPARTMENT AT [facility] Provider Note   CSN: [id number] Arrival date & time: 04/01/24  1548     Patient presents with: Fall   Sierra Ray V. is a 78 y.o. female with past medical history of hypertension, hyperlipidemia, GERD, IBS    Fall       Prior to Admission medications  Medication Sig Start Date End Date Taking? Authorizing Provider  methocarbamol (ROBAXIN) 500 MG tablet Take 1 tablet (500 mg total) by mouth 2 (two) times daily. 04/01/24  Yes Kumiko Fishman, Marry RAMAN, PA-C  ACCU-CHEK AVIVA PLUS test strip TEST BLOOD SUGAR TWICE DAILY 02/24/24   [name], [name] C, NP  Accu-Chek Softclix Lancets lancets USE TO TEST BLOOD SUGAR ONE TIME DAILY FOR DIABETES. 12/17/23   [name], [name] C, NP  acetaminophen  (TYLENOL ) 500 MG tablet Take 1 tablet (500 mg total) by mouth every 8 (eight) hours as needed for moderate pain. 06/19/21   [name], [name] C, NP  Alcohol Swabs (DROPSAFE ALCOHOL PREP) 70 % PADS TEST BLOOD SUGAR TWICE DAILY 02/24/24   [name], [name] C, NP  amLODipine  (NORVASC ) 5 MG tablet TAKE 1 TABLET EVERY DAY 12/17/23   [name], [name] C, NP  b complex vitamins capsule Take 1 capsule by mouth daily.    [provider]  Blood Glucose Monitoring Suppl (ACCU-CHEK AVIVA PLUS) w/Device KIT Use to test blood sugar twice daily. Dx E11.42 05/04/21   [name], [name] C, NP  cholecalciferol (VITAMIN D) 1000 UNITS tablet Take 1,000 Units by mouth daily.    [provider]  cyclobenzaprine  (FLEXERIL ) 5 MG tablet Take 5 mg by mouth 3 (three) times daily as needed for muscle spasms.    [provider]  fluticasone  (FLONASE ) 50 MCG/ACT nasal spray Place 2 sprays into both nostrils as needed.     [provider]  Lancets Misc. (ACCU-CHEK SOFTCLIX LANCET DEV) KIT Use to test blood sugar. Dx: E11.42 11/20/21   [name], [name] C, NP  latanoprost (XALATAN) 0.005 % ophthalmic solution 1 drop at bedtime. 01/25/21   [provider]   loratadine (CLARITIN) 10 MG tablet Take 10 mg by mouth daily as needed.    [provider]  metFORMIN  (GLUCOPHAGE ) 500 MG tablet TAKE 2 TABLETS EVERY DAY WITH BREAKFAST 02/24/24   [name], [name] C, NP  Multiple Vitamins-Minerals (MULTIVITAMIN WITH MINERALS) tablet Take 1 tablet by mouth daily.    [provider]  multivitamin-lutein (OCUVITE-LUTEIN) CAPS capsule Take 1 capsule by mouth daily.    [provider]  polyethylene glycol (MIRALAX / GLYCOLAX) packet Take 17 g by mouth daily as needed. For constipation    [provider]  Probiotic Product (ALIGN) 4 MG CAPS Take 1 capsule by mouth daily.     [provider]  Propylene Glycol (SYSTANE COMPLETE OP) Place 1 drop into both eyes in the morning, at noon, in the evening, and at bedtime.    [provider]  rosuvastatin  (CRESTOR ) 5 MG tablet TAKE 1 TABLET EVERY DAY 02/24/24   [name], [name] C, NP  zinc gluconate 50 MG tablet Take 50 mg by mouth daily.    [provider]    Allergies: Aspirin, Pravastatin  sodium, Restasis [cyclosporine], and Lisinopril     Review of Systems  Updated Vital Signs BP (!) 155/69 (BP Location: Right Arm)   Pulse 100   Temp 98.6 F (37 C) (Oral)   Resp 18   SpO2 98%   Physical Exam Vitals and nursing note reviewed.  Constitutional:  Appearance: Normal appearance.  HENT:     Head: Normocephalic and atraumatic.     Mouth/Throat:     Mouth: Mucous membranes are moist.  Eyes:     General: No scleral icterus.       Right eye: No discharge.        Left eye: No discharge.     Conjunctiva/sclera: Conjunctivae normal.  Cardiovascular:     Rate and Rhythm: Normal rate and regular rhythm.     Pulses: Normal pulses.  Pulmonary:     Effort: Pulmonary effort is normal.     Breath sounds: Normal breath sounds.  Abdominal:     General: There is no distension.     Tenderness: There is no abdominal tenderness.  Musculoskeletal:        General:  No deformity.     Cervical back: Normal range of motion.     Comments: Patient does have tenderness noted to the sacrum to palpation.  She also has tenderness to the left and right trapezius muscles, worse on the left.  Skin:    General: Skin is warm and dry.     Capillary Refill: Capillary refill takes less than 2 seconds.  Neurological:     Mental Status: She is alert.     Motor: No weakness.  Psychiatric:        Mood and Affect: Mood normal.     (all labs ordered are listed, but only abnormal results are displayed) Labs Reviewed - No data to display  EKG: None  Radiology: DG Sacrum/Coccyx Result Date: 04/01/2024 EXAM: _VIEWS_ VIEW(S) XRAY OF THE SACRUM AND COCCYX 04/01/2024 05:49:00 PM COMPARISON: None available. CLINICAL HISTORY: fall FINDINGS: BONES AND JOINTS: No acute fracture. No malalignment. No focal lytic lesions. Degenerative changes noted at L5 - S1. SOFT TISSUES: The soft tissues are unremarkable. IMPRESSION: 1. No acute fracture or dislocation. Electronically signed by: Dorethia Molt MD 04/01/2024 05:55 PM EST RP Workstation: [id number]   DG Pelvis 1-2 Views Result Date: 04/01/2024 EXAM: 1 or 2 VIEW(S) XRAY OF THE PELVIS 04/01/2024 05:49:00 PM COMPARISON: None available. CLINICAL HISTORY: fall FINDINGS: BONES AND JOINTS: There are some linear lucencies in the region of the medial right inferior pubic ramus and right pubic ramus which are most likely related to overlying external artifact. Nondisplaced fractures are difficult to exclude at this level. No other fractures are seen. No dislocation. Mild bilateral hip osteoarthritis. SOFT TISSUES: The soft tissues are unremarkable. IMPRESSION: 1. Linear lucency is overlying the right inferior pubic ramus and pubic symphysis, favored as overlying external artifact. Nondisplaced fracture is difficult to exclude. Please correlate clinically for point tenderness. 2. Mild bilateral hip osteoarthritis. Electronically signed by: Greig Pique MD 04/01/2024 05:53 PM EST RP Workstation: [id number]   CT Cervical Spine Wo Contrast Result Date: 04/01/2024 EXAM: CT CERVICAL SPINE WITHOUT CONTRAST 04/01/2024 05:37:57 PM TECHNIQUE: CT of the cervical spine was performed without the administration of intravenous contrast. Multiplanar reformatted images are provided for review. Automated exposure control, iterative reconstruction, and/or weight based adjustment of the mA/kV was utilized to reduce the radiation dose to as low as reasonably achievable. COMPARISON: Cervical spine radiographs 02/10/2009. CLINICAL HISTORY: Fall in bathtub. FINDINGS: BONES AND ALIGNMENT: No acute fracture or traumatic malalignment. Straightening of the normal cervical lordosis may be positional as the patient is in a hard collar. DEGENERATIVE CHANGES: Uncovertebral spurring leads to moderate foraminal narrowing on the left at C4-C5. Asymmetric left sided facet hypertrophy is present at C2-C3 without significant stenosis. SOFT TISSUES:  No prevertebral soft tissue swelling. IMPRESSION: 1. No acute cervical spine fracture or traumatic malalignment. Electronically signed by: Lonni Necessary MD 04/01/2024 05:47 PM EST RP Workstation: [id number]   CT Head Wo Contrast Result Date: 04/01/2024 EXAM: CT HEAD WITHOUT CONTRAST 04/01/2024 05:37:57 PM TECHNIQUE: CT of the head was performed without the administration of intravenous contrast. Automated exposure control, iterative reconstruction, and/or weight based adjustment of the mA/kV was utilized to reduce the radiation dose to as low as reasonably achievable. COMPARISON: CT head without contrast 01/21/2012. CLINICAL HISTORY: Fall in bathtub, posterior head pain. FINDINGS: BRAIN AND VENTRICLES: No acute hemorrhage. Small remote lacunar infarct in left basal ganglia. Patchy white matter hypodensities compatible with chronic microvascular ischemic change. No evidence of acute infarct. No hydrocephalus. No extra-axial  collection. No mass effect or midline shift. ORBITS: Chronic deformity of right lamina papyracea. SINUSES: No acute abnormality. SOFT TISSUES AND SKULL: Mild soft tissue swelling is present in the occipital scalp without underlying fracture. No skull fracture. VASCULATURE: Calcific atherosclerosis. IMPRESSION: 1. No acute intracranial abnormality related to the fall. 2. Mild soft tissue swelling in the occipital scalp without underlying fracture. Electronically signed by: Lonni Necessary MD 04/01/2024 05:45 PM EST RP Workstation: [id number]    Procedures   Medications Ordered in the ED - No data to display                                 Medical Decision Making  This patient presents to the ED for concern of back pain after a fall, this involves an extensive number of treatment options, and is a complaint that carries with it a high risk of complications and morbidity.   Differential diagnosis includes: Vertebral compression fracture, spinous process fracture, dislocation, spinal stenosis, arthritic pain, hematoma, UTI,  Co morbidities:  hypertension, hyperlipidemia, GERD, IBS  Lab Tests:  Not indicated  Imaging Studies:  I ordered imaging studies including sacrum, pelvic x-rays and CT head and C-spine without contrast I independently visualized and interpreted imaging which showed no evidence of fracture bleeding. I agree with the radiologist interpretation  Cardiac Monitoring/ECG:  The patient was maintained on a cardiac monitor.  I personally viewed and interpreted the cardiac monitored which showed an underlying rhythm of: Sinus rhythm  Medicines ordered and prescription drug management:  I did offer the patient Tylenol  and a lidocaine  patch while she is in the emergency department, however she declined stating she has both of these things at home.  Test Considered:   none  Critical Interventions:   none  Consultations Obtained: None  Problem List / ED  Course:     ICD-10-CM   1. Fall, initial encounter  W19.CHERENE       MDM: 78 year old female who presents emergency department for evaluation after a fall.  Patient not on blood thinners.  Symptoms started immediately after patient fell.  Workup was unremarkable.  No evidence of fractures noted on imaging.  Patient is not point tender in her pelvis, so unlikely pelvic fracture.  She does have sacral tenderness to palpation, likely secondary to bruising after her fall.  Patient is able to ambulate without difficulty.  No indication for further workup at this time.  Patient given a muscle relaxer for pain as well as educated on continuing to take Tylenol  and using lidocaine  patches as needed for pain management.  Patient's vital signs are stable.  Patient is appropriate for discharge at this time.  Dispostion:  After consideration of the diagnostic results and the patients response to treatment, I feel that the patient would benefit from supportive care.   Final diagnoses:  Fall, initial encounter    ED Discharge Orders          Ordered    methocarbamol (ROBAXIN) 500 MG tablet  2 times daily        04/01/24 2011               Torrence Marry RAMAN, PA-C 04/01/24 2018

## 2024-04-01 NOTE — ED Provider Triage Note (Signed)
 Emergency Medicine Provider Triage Evaluation Note  Sierra Lacson V. , a 78 y.o. female  was evaluated in triage.  Pt complains of unwitnessed mechanical fall in the bathtub.  Hit her head.  No LOC.  Not on thinners.  Head feels woozy.  Describes neck pain and pain to her tailbone.  Is ambulatory.  Review of Systems  Positive:  Negative:   Physical Exam  BP (!) 155/69 (BP Location: Right Arm)   Pulse 100   Temp 98.6 F (37 C) (Oral)   Resp 18   SpO2 98%  Gen:   Awake, no distress   Resp:  Normal effort  MSK:   Moves extremities without difficulty  Other:    Medical Decision Making  Medically screening exam initiated at 5:18 PM.  Appropriate orders placed.  Sierra Farone V. was informed that the remainder of the evaluation will be completed by another provider, this initial triage assessment does not replace that evaluation, and the importance of remaining in the ED until their evaluation is complete.  C-collar placed, CT imaging and x-rays ordered   Donnajean Lynwood DEL, PA-C 04/01/24 1719

## 2024-04-01 NOTE — ED Triage Notes (Signed)
 Pt states she slipped in the bathtub and fell just PTA. Pt c.o posterior head pain, neck pain and lower back pain. Denies LOC or taking any blood thinners

## 2024-04-01 NOTE — Discharge Instructions (Addendum)
 It was a pleasure taking care of you today. You were seen in the Emergency Department for evaluation after a fall. Your work-up was reassuring. Your x-ray showed no evidence of acute fracture.  As discussed, your pain is likely secondary to a bruise and/or muscle spasm after falling.  I have sent your prescription for a medication called Robaxin, which is a muscle relaxer.  I recommend only taking this at night, as it can cause drowsiness.  Additionally, you may continue to medicate with Exer strength Tylenol  and topical lidocaine  patches. Refer to the attached documentation for further management of your symptoms. Follow up with your PCP if your symptoms continue.  Please return to the ER if you experience chest pain, trouble breathing, intractable nausea/vomiting or any other life threatening illnesses.

## 2024-04-07 ENCOUNTER — Inpatient Hospital Stay: Admitting: Adult Health

## 2024-04-15 ENCOUNTER — Encounter: Payer: Self-pay | Admitting: Family

## 2024-04-15 ENCOUNTER — Ambulatory Visit (INDEPENDENT_AMBULATORY_CARE_PROVIDER_SITE_OTHER): Admitting: Family

## 2024-04-15 VITALS — BP 138/84 | HR 74 | Temp 97.8°F | Resp 17 | Ht 66.0 in | Wt 153.2 lb

## 2024-04-15 DIAGNOSIS — Z7984 Long term (current) use of oral hypoglycemic drugs: Secondary | ICD-10-CM

## 2024-04-15 DIAGNOSIS — E1142 Type 2 diabetes mellitus with diabetic polyneuropathy: Secondary | ICD-10-CM | POA: Diagnosis not present

## 2024-04-15 DIAGNOSIS — M545 Low back pain, unspecified: Secondary | ICD-10-CM | POA: Diagnosis not present

## 2024-04-15 DIAGNOSIS — H938X3 Other specified disorders of ear, bilateral: Secondary | ICD-10-CM

## 2024-04-15 DIAGNOSIS — K5901 Slow transit constipation: Secondary | ICD-10-CM

## 2024-04-15 NOTE — Progress Notes (Signed)
 "  Provider: Nithya Meriweather FNP-C   Nicolette Gieske, Roxan BROCKS, NP  Patient Care Team: Jourdan Durbin, Roxan BROCKS, NP as PCP - General (Family Medicine) Hobart Powell BRAVO, MD (Inactive) as PCP - Cardiology (Cardiology) Maranda Leim DEL, MD as Consulting Physician (Cardiology) Rollin Dover, MD as Consulting Physician (Gastroenterology) Sheldon Standing, MD as Consulting Physician (General Surgery) Tobie Tonita POUR, DO as Consulting Physician (Neurology) [facility], [facility]  Extended Emergency Contact Information Primary Emergency Contact: [facility] Phone: [phone] Relation: Daughter Secondary Emergency Contact: Cintron,Steven Address: [address]  [place], [place] [address] [place] Phone: [phone] Relation: [place]  Code Status: Full code Goals of care: Advanced Directive information    04/15/2024   11:01 AM  Advanced Directives  Does Patient Have a Medical Advance Directive? Yes  Type of Estate Agent of [place];Living will  Copy of Healthcare Power of Attorney in Chart? Yes - validated most recent copy scanned in chart (See row information)     Chief Complaint  Patient presents with   Transitions Of Care    ED follow up    History of Present Illness   Sierra Muehl V. is a 78 year old female who presents for a hospital follow-up after a fall.  She experienced a fall in her bathroom while attempting to change a light bulb, resulting in a head and coccyx injury. Imaging showed no fractures, and the patient was told there was mild soft tissue swelling in the occipital scalp. The swelling has decreased, but she continues to have soreness in the sacral area with pain radiating to both sides of her lower back. No bruising or radiation of pain to the legs. She was prescribed muscle relaxants but did not take them due to insomnia. She uses Tylenol  1000 mg every six hours and lidocaine  patches, which provide  some relief. Sitting and standing make her back pain worse. She has a history of arthritis in her hip and degenerative changes from L5 to S1, with chronic lower back pain prior to the fall.  She reports issues with ear wax buildup, causing fullness and decreased hearing, particularly in the right ear. She uses ear wax drops for management.  She experiences gastrointestinal issues, including difficulty with bowel movements and changes in stool consistency. She uses Miralax twice daily with limited success.  She notes a recent increase in blood sugar levels, attributed to decreased physical activity due to her current condition, with levels rising from a baseline of 110-120 to 140-150.    Past Medical History:  Diagnosis Date   Abnormal Pap smear 05/08/2011   ASC-US  +HPV   Arthritis    Asthma    Constipation    Diabetes mellitus type 2 with neurological manifestations (HCC)    Diabetic peripheral neuropathy (HCC)    Generalized osteoarthritis    GERD (gastroesophageal reflux disease)    Hx of seasonal allergies    Hyperlipidemia    Hypertension    IBS (irritable bowel syndrome)    S/P total hysterectomy 06/21/2011   Past Surgical History:  Procedure Laterality Date   CATARACT EXTRACTION Left    CATARACT EXTRACTION Right 12/25/2022   EYE SURGERY Right 01/2018   Removed raised area on eye. Dr.Groat    OVARIAN CYST SURGERY  1986   PELVIC LAPAROSCOPY     TONSILECTOMY, ADENOIDECTOMY, BILATERAL MYRINGOTOMY AND TUBES  1974   VAGINAL HYSTERECTOMY  1983    Allergies[1]  Allergies  as of 04/15/2024       Reactions   Aspirin    Pravastatin  Sodium Other (See Comments)   Muscle aches   Restasis [cyclosporine] Other (See Comments)   Lisinopril  Other (See Comments)   Blood pressures low and causes constipation        Medication List        Accurate as of April 15, 2024  3:09 PM. If you have any questions, ask your nurse or doctor.          STOP taking these medications     cyclobenzaprine  5 MG tablet Commonly known as: FLEXERIL  Stopped by: Akiya Morr, NP   methocarbamol 500 MG tablet Commonly known as: ROBAXIN Stopped by: Emrys Mckamie, NP   SYSTANE COMPLETE OP Stopped by: Jasamine Pottinger, NP       TAKE these medications    Accu-Chek Aviva Plus test strip Generic drug: glucose blood TEST BLOOD SUGAR TWICE DAILY   Accu-Chek Aviva Plus w/Device Kit Use to test blood sugar twice daily. Dx E11.42   Accu-Chek Softclix Lancet Dev Kit Use to test blood sugar. Dx: E11.42   Accu-Chek Softclix Lancets lancets USE TO TEST BLOOD SUGAR ONE TIME DAILY FOR DIABETES.   acetaminophen  500 MG tablet Commonly known as: TYLENOL  Take 1 tablet (500 mg total) by mouth every 8 (eight) hours as needed for moderate pain.   Align 4 MG Caps Take 1 capsule by mouth daily.   amLODipine  5 MG tablet Commonly known as: NORVASC  TAKE 1 TABLET EVERY DAY   b complex vitamins capsule Take 1 capsule by mouth daily.   cholecalciferol 1000 units tablet Commonly known as: VITAMIN D Take 1,000 Units by mouth daily.   DropSafe Alcohol Prep 70 % Pads TEST BLOOD SUGAR TWICE DAILY   fluticasone  50 MCG/ACT nasal spray Commonly known as: FLONASE  Place 2 sprays into both nostrils as needed.   latanoprost 0.005 % ophthalmic solution Commonly known as: XALATAN 1 drop at bedtime.   loratadine 10 MG tablet Commonly known as: CLARITIN Take 10 mg by mouth daily as needed.   metFORMIN  500 MG tablet Commonly known as: GLUCOPHAGE  TAKE 2 TABLETS EVERY DAY WITH BREAKFAST   multivitamin-lutein Caps capsule Take 1 capsule by mouth daily.   multivitamin with minerals tablet Take 1 tablet by mouth daily.   polyethylene glycol 17 g packet Commonly known as: MIRALAX / GLYCOLAX Take 17 g by mouth daily as needed. For constipation   rosuvastatin  5 MG tablet Commonly known as: CRESTOR  TAKE 1 TABLET EVERY DAY   zinc gluconate 50 MG tablet Take 50 mg by mouth daily.         Review of Systems  Constitutional:  Negative for appetite change, chills, fatigue, fever and unexpected weight change.  HENT:  Negative for congestion, dental problem, ear discharge, ear pain, facial swelling, hearing loss, nosebleeds, postnasal drip, rhinorrhea, sinus pressure, sinus pain, sneezing, sore throat, tinnitus and trouble swallowing.        Right ear fullness  Eyes:  Negative for pain, discharge, redness, itching and visual disturbance.  Respiratory:  Negative for cough, chest tightness, shortness of breath and wheezing.   Cardiovascular:  Negative for chest pain, palpitations and leg swelling.  Gastrointestinal:  Negative for abdominal distention, abdominal pain, blood in stool, constipation, diarrhea, nausea and vomiting.  Endocrine: Negative for cold intolerance, heat intolerance, polydipsia, polyphagia and polyuria.  Genitourinary:  Negative for difficulty urinating, dysuria, flank pain, frequency and urgency.  Musculoskeletal:  Positive for back pain, gait  problem and neck pain. Negative for arthralgias, joint swelling, myalgias and neck stiffness.       Low back pain  Skin:  Negative for color change, pallor, rash and wound.  Neurological:  Negative for dizziness, syncope, speech difficulty, weakness, light-headedness, numbness and headaches.  Hematological:  Does not bruise/bleed easily.  Psychiatric/Behavioral:  Negative for agitation, behavioral problems, confusion, hallucinations, self-injury, sleep disturbance and suicidal ideas. The patient is not nervous/anxious.     Immunization History  Administered Date(s) Administered   Fluad Quad(high Dose 65+) 02/11/2019, 02/12/2020, 01/27/2021   Fluad Trivalent(High Dose 65+) 01/09/2023   INFLUENZA, HIGH DOSE SEASONAL PF 01/28/2018, 01/23/2022, 01/14/2024   Influenza,inj,Quad PF,6+ Mos 02/03/2016, 01/23/2017   Moderna Sars-Covid-2 Vaccination 06/22/2019, 07/24/2019, 03/24/2020   PFIZER(Purple Top)SARS-COV-2  Vaccination 04/26/2021   Pneumococcal Conjugate-13 07/30/2014   Pneumococcal Polysaccharide-23 11/17/2005, 05/20/2012   Td 08/18/2010, 02/16/2021   Tdap 02/16/2021   Unspecified SARS-COV-2 Vaccination 08/05/2023   Pertinent  Health Maintenance Due  Topic Date Due   FOOT EXAM  03/26/2024   HEMOGLOBIN A1C  07/20/2024   OPHTHALMOLOGY EXAM  10/14/2024   Influenza Vaccine  Completed   Bone Density Scan  Completed   Mammogram  Discontinued   Colonoscopy  Discontinued      07/03/2023   10:56 AM 10/23/2023    9:20 AM 12/23/2023    3:35 PM 01/14/2024    9:15 AM 04/15/2024   11:01 AM  Fall Risk  Falls in the past year? 0 0 0 0 1  Was there an injury with Fall? 0  0  0  0  0  Fall Risk Category Calculator 0 0 0 0 1  Patient at Risk for Falls Due to No Fall Risks No Fall Risks No Fall Risks No Fall Risks No Fall Risks  Fall risk Follow up Falls evaluation completed Falls evaluation completed Falls evaluation completed Falls evaluation completed Falls evaluation completed     Data saved with a previous flowsheet row definition   Functional Status Survey:    Vitals:   04/15/24 1104  BP: 138/84  Pulse: 74  Resp: 17  Temp: 97.8 F (36.6 C)  SpO2: 97%  Weight: 153 lb 3.2 oz (69.5 kg)  Height: 5' 6 (1.676 m)   Body mass index is 24.73 kg/m. Physical Exam  VITALS: T- 97.8, P- 74, BP- 138/84, SaO2- 97% MEASUREMENTS: Weight- 153. GENERAL: Alert, cooperative, well developed, no acute distress. HEENT: Normocephalic, normal oropharynx, moist mucous membranes, left ear clear, cerumen impaction in right ear. NECK: Supple, no cervical adenopathy, no thyromegaly, trachea midline, no jugular venous pressure, no carotid bruit. CHEST: Clear to auscultation bilaterally, no wheezes, rhonchi, or crackles. CARDIOVASCULAR: Normal heart rate and rhythm, S1 and S2 normal without murmurs. ABDOMEN: Soft, non-tender, non-distended, without organomegaly, normal bowel sounds. EXTREMITIES: No cyanosis or  edema. NEUROLOGICAL: Cranial nerves grossly intact, moves all extremities without gross motor or sensory deficit.  SKIN: No rash,no lesion or erythema   PSYCHIATRY/BEHAVIORAL: Mood stable    Labs reviewed: Recent Labs    10/17/23 0820 01/20/24 0835  NA 139 139  K 4.0 4.5  CL 101 102  CO2 30 29  GLUCOSE 125* 116*  BUN 21 19  CREATININE 0.89 0.86  CALCIUM  9.3 9.6   Recent Labs    10/17/23 0820  AST 18  ALT 12  BILITOT 0.4  PROT 7.2   Recent Labs    10/17/23 0820  WBC 5.0  NEUTROABS 1,895  HGB 13.0  HCT 40.7  MCV 84.8  PLT 168   Lab Results  Component Value Date   TSH 1.86 10/17/2023   Lab Results  Component Value Date   HGBA1C 7.3 (H) 01/20/2024   Lab Results  Component Value Date   CHOL 152 10/17/2023   HDL 77 10/17/2023   LDLCALC 55 10/17/2023   TRIG 117 10/17/2023   CHOLHDL 2.0 10/17/2023    Significant Diagnostic Results in last 30 days:  DG Sacrum/Coccyx Result Date: 04/01/2024 EXAM: _VIEWS_ VIEW(S) XRAY OF THE SACRUM AND COCCYX 04/01/2024 05:49:00 PM COMPARISON: None available. CLINICAL HISTORY: fall FINDINGS: BONES AND JOINTS: No acute fracture. No malalignment. No focal lytic lesions. Degenerative changes noted at L5 - S1. SOFT TISSUES: The soft tissues are unremarkable. IMPRESSION: 1. No acute fracture or dislocation. Electronically signed by: Dorethia Molt MD 04/01/2024 05:55 PM EST RP Workstation: [id number]   DG Pelvis 1-2 Views Result Date: 04/01/2024 EXAM: 1 or 2 VIEW(S) XRAY OF THE PELVIS 04/01/2024 05:49:00 PM COMPARISON: None available. CLINICAL HISTORY: fall FINDINGS: BONES AND JOINTS: There are some linear lucencies in the region of the medial right inferior pubic ramus and right pubic ramus which are most likely related to overlying external artifact. Nondisplaced fractures are difficult to exclude at this level. No other fractures are seen. No dislocation. Mild bilateral hip osteoarthritis. SOFT TISSUES: The soft tissues are  unremarkable. IMPRESSION: 1. Linear lucency is overlying the right inferior pubic ramus and pubic symphysis, favored as overlying external artifact. Nondisplaced fracture is difficult to exclude. Please correlate clinically for point tenderness. 2. Mild bilateral hip osteoarthritis. Electronically signed by: Greig Pique MD 04/01/2024 05:53 PM EST RP Workstation: [id number]   CT Cervical Spine Wo Contrast Result Date: 04/01/2024 EXAM: CT CERVICAL SPINE WITHOUT CONTRAST 04/01/2024 05:37:57 PM TECHNIQUE: CT of the cervical spine was performed without the administration of intravenous contrast. Multiplanar reformatted images are provided for review. Automated exposure control, iterative reconstruction, and/or weight based adjustment of the mA/kV was utilized to reduce the radiation dose to as low as reasonably achievable. COMPARISON: Cervical spine radiographs 02/10/2009. CLINICAL HISTORY: Fall in bathtub. FINDINGS: BONES AND ALIGNMENT: No acute fracture or traumatic malalignment. Straightening of the normal cervical lordosis may be positional as the patient is in a hard collar. DEGENERATIVE CHANGES: Uncovertebral spurring leads to moderate foraminal narrowing on the left at C4-C5. Asymmetric left sided facet hypertrophy is present at C2-C3 without significant stenosis. SOFT TISSUES: No prevertebral soft tissue swelling. IMPRESSION: 1. No acute cervical spine fracture or traumatic malalignment. Electronically signed by: Lonni Necessary MD 04/01/2024 05:47 PM EST RP Workstation: [id number]   CT Head Wo Contrast Result Date: 04/01/2024 EXAM: CT HEAD WITHOUT CONTRAST 04/01/2024 05:37:57 PM TECHNIQUE: CT of the head was performed without the administration of intravenous contrast. Automated exposure control, iterative reconstruction, and/or weight based adjustment of the mA/kV was utilized to reduce the radiation dose to as low as reasonably achievable. COMPARISON: CT head without contrast 01/21/2012. CLINICAL  HISTORY: Fall in bathtub, posterior head pain. FINDINGS: BRAIN AND VENTRICLES: No acute hemorrhage. Small remote lacunar infarct in left basal ganglia. Patchy white matter hypodensities compatible with chronic microvascular ischemic change. No evidence of acute infarct. No hydrocephalus. No extra-axial collection. No mass effect or midline shift. ORBITS: Chronic deformity of right lamina papyracea. SINUSES: No acute abnormality. SOFT TISSUES AND SKULL: Mild soft tissue swelling is present in the occipital scalp without underlying fracture. No skull fracture. VASCULATURE: Calcific atherosclerosis. IMPRESSION: 1. No acute intracranial abnormality related to the fall. 2. Mild soft tissue swelling in the occipital  scalp without underlying fracture. Electronically signed by: Lonni Necessary MD 04/01/2024 05:45 PM EST RP Workstation: [id number]    Assessment/Plan  Acute low back pain with lumbar spondylosis Acute low back pain following a fall, with tenderness in the sacral and coccyx area. Imaging showed no fractures, but possible tiny fractures not ruled out. Pain exacerbated by sitting and relieved by Tylenol  and lidocaine  patches. Muscle relaxants not tolerated due to side effects. Degenerative changes noted on L5 to S1. - Referred to orthopedic specialist for further evaluation of possible fractures. - Continue Tylenol  every six hours as needed for pain. - Use lidocaine  patches as needed for pain relief.  Slow transit constipation Chronic constipation with slow transit, not relieved by current regimen of Miralax. Reports small, infrequent bowel movements despite increased water intake and dietary changes. Previous use of Linzess was ineffective. - Continue Miralax, consider increasing frequency to twice daily. - Monitor bowel movements and adjust treatment as needed.  Bilateral ear fullness due to impacted cerumen Bilateral ear fullness with impacted cerumen, particularly in the right ear.  Previous attempts to remove cerumen were partially successful. Symptoms include fullness and decreased hearing. - Referred to ENT specialist for cerumen removal. - Continue using ear wax drops to soften cerumen.  Type 2 diabetes mellitus Recent increase in blood glucose levels, possibly due to decreased physical activity and dietary changes. Blood sugar levels have risen from 110-120 mg/dL to 859-849 mg/dL. - Monitor blood glucose levels regularly. - Encouraged dietary modifications to reduce carbohydrate intake. - Will discuss potential need for endocrinology referral if blood glucose levels remain elevated.   Family/ staff Communication: Reviewed plan of care with patient verbalized understanding  Labs/tests ordered: None   Next Appointment : Return if symptoms worsen or fail to improve.   Spent 30 minutes of Face to face and non-face to face with patient  >50% time spent counseling; reviewing medical record; tests; labs; documentation and developing future plan of care.   Roxan BROCKS Nikko Goldwire, NP      [1]  Allergies Allergen Reactions   Aspirin    Pravastatin  Sodium Other (See Comments)    Muscle aches   Restasis [Cyclosporine] Other (See Comments)   Lisinopril  Other (See Comments)    Blood pressures low and causes constipation   "

## 2024-04-20 ENCOUNTER — Telehealth: Payer: Self-pay | Admitting: *Deleted

## 2024-04-20 DIAGNOSIS — E1142 Type 2 diabetes mellitus with diabetic polyneuropathy: Secondary | ICD-10-CM

## 2024-04-20 NOTE — Telephone Encounter (Signed)
 Podiatrist Refer ordered as requested.

## 2024-04-20 NOTE — Telephone Encounter (Signed)
 Copied from CRM [id number]. Topic: Referral - Question >> Apr 17, 2024  4:14 PM DeAngela L wrote: Reason for CRM: patient is calling to ask if Ms [name], Roxan BROCKS, NP could submit a referral for   [facility], DPM Phone: [phone]  The patient states the insurance company told her she needed a new request fr her appointment on Tuesday and the patient has been seeing this provider for a longtime and she was confused by needing a new request but she is asking if Roxan could submit this quickly so she can go Tuesday   Pt num  [phone]

## 2024-04-20 NOTE — Telephone Encounter (Signed)
 Patient notified and agreed.

## 2024-04-20 NOTE — Telephone Encounter (Deleted)
 Copied from CRM [id number]. Topic: Referral - Question >> Apr 17, 2024  4:14 PM DeAngela L wrote: Reason for CRM: patient is calling to ask if Ms [name], Roxan BROCKS, NP could submit a referral for   [facility], DPM Phone: [phone]  The patient states the insurance company told her she needed a new request fr her appointment on Tuesday and the patient has been seeing this provider for a longtime and she was confused by needing a new request but she is asking if Roxan could submit this quickly so she can go Tuesday   Pt num  [phone]

## 2024-04-21 ENCOUNTER — Ambulatory Visit: Admitting: Podiatry

## 2024-04-21 ENCOUNTER — Encounter: Payer: Self-pay | Admitting: Podiatry

## 2024-04-21 DIAGNOSIS — E1142 Type 2 diabetes mellitus with diabetic polyneuropathy: Secondary | ICD-10-CM

## 2024-04-21 DIAGNOSIS — B351 Tinea unguium: Secondary | ICD-10-CM | POA: Diagnosis not present

## 2024-04-21 DIAGNOSIS — L84 Corns and callosities: Secondary | ICD-10-CM | POA: Diagnosis not present

## 2024-04-21 DIAGNOSIS — M79609 Pain in unspecified limb: Secondary | ICD-10-CM | POA: Diagnosis not present

## 2024-04-26 NOTE — Progress Notes (Signed)
 "  Subjective:  Patient ID: Sierra Hemmer V., female    DOB: 03-21-1946,  MRN: [id number]  Sierra Pata V. presents to clinic today for at risk foot care with history of diabetic neuropathy and callus(es) b/l feet and painful mycotic toenails that are difficult to trim. Painful toenails interfere with ambulation. Aggravating factors include wearing enclosed shoe gear. Pain is relieved with periodic professional debridement. Painful calluses are aggravated when weightbearing with and without shoegear. Pain is relieved with periodic professional debridement.  Chief Complaint  Patient presents with   [facility]    Rm17 Diabetic foot care/ Dr. Roxan Plough last visit December 2025/Blood sugar 120   New problem(s): None.   PCP is [name], Roxan BROCKS, NP.  Allergies[1]  Review of Systems: Negative except as noted in the HPI.  Objective: No changes noted in today's physical examination. There were no vitals filed for this visit. Sierra Blissett V. is a pleasant 79 y.o. female WD, WN in NAD. AAO x 3.  Vascular Examination: Capillary refill time immediate b/l. Palpable pedal pulses. Pedal hair present b/l. Pedal edema absent. No pain with calf compression b/l. Skin temperature gradient WNL b/l. No cyanosis or clubbing b/l. No ischemia or gangrene noted b/l.   Neurological Examination: Sensation grossly intact b/l with 10 gram monofilament. Vibratory sensation intact b/l. Pt has subjective symptoms of neuropathy.  Dermatological Examination: Pedal skin with normal turgor, texture and tone b/l.  No open wounds. No interdigital macerations.   Toenails 1-5 b/l thick, discolored, elongated with subungual debris and pain on dorsal palpation.   Hyperkeratotic lesion(s) medial IPJ of left great toe, medial IPJ of right great toe, and submet head 1 left foot.  No erythema, no edema, no drainage, no fluctuance.  Musculoskeletal Examination: Muscle strength 5/5 to all lower extremity muscle  groups bilaterally. No pain, crepitus or joint limitation noted with ROM b/l LE. No gross bony pedal deformities b/l. Patient ambulates independently without assistive aids.  Radiographs: None  Assessment/Plan: 1. Pain due to onychomycosis of nail   2. Callus   3. Type 2 diabetes mellitus with diabetic polyneuropathy, without long-term current use of insulin (HCC)     Consent given for treatment. Patient examined. All patient's and/or POA's questions/concerns addressed on today's visit. Mycotic toenails 1-5 b/l  debrided in length and girth without incident. Callus(es) medial IPJ of right great toe, plantar IPJ of left great toe, and submet head 1 left foot pared with sharp debridement without incident. Continue foot and shoe inspections daily. Monitor blood glucose per PCP/Endocrinologist's recommendations.Continue soft, supportive shoe gear daily. Report any pedal injuries to medical professional. Call office if there are any quesitons/concerns.  Return in about 3 months (around 07/20/2024).  Delon LITTIE Merlin, DPM      [place] LOCATION: [address], [place] [address]                   Office [phone]   [facility] LOCATION: [address] [place], [place] [address] Office [phone]     [1]  Allergies Allergen Reactions   Aspirin    Pravastatin  Sodium Other (See Comments)    Muscle aches   Restasis [Cyclosporine] Other (See Comments)   Lisinopril   Other (See Comments)    Blood pressures low and causes constipation   "

## 2024-04-29 ENCOUNTER — Ambulatory Visit: Admitting: Podiatry

## 2024-05-05 ENCOUNTER — Ambulatory Visit: Admitting: Orthopaedic Surgery

## 2024-05-05 DIAGNOSIS — M545 Low back pain, unspecified: Secondary | ICD-10-CM

## 2024-05-05 NOTE — Progress Notes (Signed)
 "  Office Visit Note   Patient: Sierra Manager V.           Date of Birth: [birth year]           MRN: [id number] Visit Date: 05/05/2024              Requested by: Leonarda Roxan BROCKS, NP [address] [place],  [place] [address] PCP: Leonarda Roxan BROCKS, NP   Assessment & Plan: Visit Diagnoses:  1. Acute midline low back pain without sciatica     Plan: Impression is resolving low back pain from recent mechanical fall.  Her pain is mild at this point.  Denies any radicular symptoms.  Continue to advance activity as tolerated and symptomatic treatment as needed.  Follow-up as needed.  Follow-Up Instructions: Return if symptoms worsen or fail to improve.   Orders:  No orders of the defined types were placed in this encounter.  No orders of the defined types were placed in this encounter.     Procedures: No procedures performed   Clinical Data: No additional findings.   Subjective: Chief Complaint  Patient presents with   Lower Back - Pain    HPI Patient comes in for evaluation of low back pain.  Had a mechanical fall about a month ago.  Went to the ER for x-rays which were negative.  Since then she has gotten a lot better and rates the pain is 2 out of 10.  She has some minor pain in the lower back.  Denies any radicular symptoms.  Mainly wants to come in to establish orthopedic care.  Uses a cane in public.  Uses Exer strength Tylenol  and lidocaine . Review of Systems  Constitutional: Negative.   HENT: Negative.    Eyes: Negative.   Respiratory: Negative.    Cardiovascular: Negative.   Endocrine: Negative.   Musculoskeletal: Negative.   Neurological: Negative.   Hematological: Negative.   Psychiatric/Behavioral: Negative.    All other systems reviewed and are negative.    Objective: Vital Signs: There were no vitals taken for this visit.  Physical Exam Vitals and nursing note reviewed.  Constitutional:      Appearance: She is well-developed.  HENT:     Head:  Atraumatic.     Nose: Nose normal.  Eyes:     Extraocular Movements: Extraocular movements intact.  Cardiovascular:     Pulses: Normal pulses.  Pulmonary:     Effort: Pulmonary effort is normal.  Abdominal:     Palpations: Abdomen is soft.  Musculoskeletal:     Cervical back: Neck supple.  Skin:    General: Skin is warm.     Capillary Refill: Capillary refill takes less than 2 seconds.  Neurological:     Mental Status: She is alert. Mental status is at baseline.  Psychiatric:        Behavior: Behavior normal.        Thought Content: Thought content normal.        Judgment: Judgment normal.     Ortho Exam Examination shows mild discomfort in the lower lumbar spine.  No focal abnormalities on exam. Specialty Comments:  No specialty comments available.  Imaging: No results found.   PMFS History: Patient Active Problem List   Diagnosis Date Noted   Hoarseness 01/23/2024   Supraventricular premature beats 01/23/2024   Cramps of lower extremity 01/23/2024   TMJ (temporomandibular joint syndrome) 12/04/2022   Intussusception of small bowel (HCC) 11/21/2021   Chronic constipation 11/21/2021   Abdominal  pain 08/21/2021   Adaptive colitis 08/21/2021   Allergic rhinitis 08/21/2021   Anxiety state 08/21/2021   Cramp of limb 08/21/2021   Diabetes mellitus with polyneuropathy (HCC) 08/21/2021   Dysphonia 08/21/2021   Generalized OA 08/21/2021   Low back pain 08/21/2021   Primary generalized (osteo)arthritis 08/21/2021   Type II diabetes mellitus with neurological manifestations (HCC) 08/21/2021   Hypothyroidism (acquired) 08/10/2021   Claudication 08/04/2020   Vitreous floaters, left 04/19/2020   Posterior vitreous detachment, left eye 04/19/2020   Vitreomacular traction, left 03/22/2020   Posterior vitreous detachment of right eye 03/22/2020   Nuclear sclerotic cataract of right eye 03/22/2020   Chronic pain of both knees 08/12/2019   Unsteady gait 08/12/2019   Slow  transit constipation 08/12/2019   Constipation 01/16/2019   Uncomplicated asthma 05/10/2018   Dyspnea on exertion 05/10/2018   Gastroesophageal reflux disease 05/10/2018   Poor short-term memory 05/10/2018   Irritable bowel syndrome 05/10/2018   Atrial tachycardia 05/06/2018   Insomnia 05/06/2018   Recurrent major depression 05/06/2018   Hypertensive disorder 05/06/2018   Diabetes mellitus (HCC) 05/06/2018   Routine general medical examination at a health care facility 03/21/2018   High risk medication use 07/24/2017   Depression 01/23/2017   Costochondritis 07/25/2016   Varicose veins of both lower extremities with complications 07/25/2016   Short-term memory loss 02/03/2016   Pain in joint, multiple sites 02/03/2016   Chronic seasonal allergic rhinitis due to pollen 02/03/2016   Gastroesophageal reflux disease without esophagitis 07/03/2015   Type 2 diabetes mellitus with diabetic polyneuropathy, without long-term current use of insulin (HCC) 07/03/2015   Hyperlipidemia LDL goal <100 07/03/2015   Benign essential HTN 07/03/2015   Encounter for gynecological examination with abnormal finding 10/01/2014   Mastodynia 10/01/2014   Carpal tunnel syndrome 05/20/2014   Essential hypertension 01/15/2014   Palpitations 01/15/2014   Hyperlipidemia 01/15/2014   DOE (dyspnea on exertion) 01/15/2014   PAC (premature atrial contraction) 01/15/2014   S/P total hysterectomy 06/21/2011   Past Medical History:  Diagnosis Date   Abnormal Pap smear 05/08/2011   ASC-US  +HPV   Arthritis    Asthma    Constipation    Diabetes mellitus type 2 with neurological manifestations (HCC)    Diabetic peripheral neuropathy (HCC)    Generalized osteoarthritis    GERD (gastroesophageal reflux disease)    Hx of seasonal allergies    Hyperlipidemia    Hypertension    IBS (irritable bowel syndrome)    S/P total hysterectomy 06/21/2011    Family History  Problem Relation Age of Onset   Diabetes Mother     Cancer Mother        colon    Diabetes Father    Heart disease Father    Pancreatic cancer Sister    Breast cancer Maternal Aunt    Breast cancer Paternal Aunt    Cancer Paternal Grandmother        liver    Breast cancer Cousin    Breast cancer Cousin    Pancreatic cancer Brother     Past Surgical History:  Procedure Laterality Date   CATARACT EXTRACTION Left    CATARACT EXTRACTION Right 12/25/2022   EYE SURGERY Right 01/2018   Removed raised area on eye. Dr.Groat    OVARIAN CYST SURGERY  1986   PELVIC LAPAROSCOPY     TONSILECTOMY, ADENOIDECTOMY, BILATERAL MYRINGOTOMY AND TUBES  1974   VAGINAL HYSTERECTOMY  1983   Social History   Occupational History  Employer: RETIRED  Tobacco Use   Smoking status: Never   Smokeless tobacco: Never  Vaping Use   Vaping status: Never Used  Substance and Sexual Activity   Alcohol use: No   Drug use: No   Sexual activity: Not Currently    Birth control/protection: Surgical    Comment: 1st intercourse- 20, partners- 8,         "

## 2024-07-21 ENCOUNTER — Other Ambulatory Visit: Payer: Self-pay

## 2024-07-23 ENCOUNTER — Ambulatory Visit: Payer: Self-pay | Admitting: Family

## 2024-07-28 ENCOUNTER — Ambulatory Visit: Admitting: Podiatry

## 2024-08-04 ENCOUNTER — Ambulatory Visit (HOSPITAL_BASED_OUTPATIENT_CLINIC_OR_DEPARTMENT_OTHER)

## 2025-01-14 ENCOUNTER — Ambulatory Visit: Payer: Self-pay | Admitting: Family
